# Patient Record
Sex: Male | Born: 1950
Health system: Southern US, Community
[De-identification: ages and names within clinical notes are randomized; demographics above are authoritative.]

## PROBLEM LIST (undated history)

## (undated) DIAGNOSIS — Z973 Presence of spectacles and contact lenses: Secondary | ICD-10-CM

## (undated) DIAGNOSIS — I10 Essential (primary) hypertension: Secondary | ICD-10-CM

## (undated) DIAGNOSIS — R55 Syncope and collapse: Secondary | ICD-10-CM

## (undated) DIAGNOSIS — Z808 Family history of malignant neoplasm of other organs or systems: Secondary | ICD-10-CM

## (undated) DIAGNOSIS — Z7901 Long term (current) use of anticoagulants: Secondary | ICD-10-CM

## (undated) DIAGNOSIS — J302 Other seasonal allergic rhinitis: Secondary | ICD-10-CM

## (undated) DIAGNOSIS — R351 Nocturia: Secondary | ICD-10-CM

## (undated) DIAGNOSIS — Z8349 Family history of other endocrine, nutritional and metabolic diseases: Secondary | ICD-10-CM

## (undated) DIAGNOSIS — Z8042 Family history of malignant neoplasm of prostate: Secondary | ICD-10-CM

## (undated) DIAGNOSIS — Z8619 Personal history of other infectious and parasitic diseases: Secondary | ICD-10-CM

## (undated) DIAGNOSIS — D170 Benign lipomatous neoplasm of skin and subcutaneous tissue of head, face and neck: Secondary | ICD-10-CM

## (undated) HISTORY — DX: Family history of malignant neoplasm of prostate: Z80.42

## (undated) HISTORY — DX: Other seasonal allergic rhinitis: J30.2

## (undated) HISTORY — DX: Personal history of other infectious and parasitic diseases: Z86.19

## (undated) HISTORY — DX: Family history of malignant neoplasm of other organs or systems: Z80.8

## (undated) HISTORY — DX: Syncope and collapse: R55

---

## 2004-09-04 ENCOUNTER — Observation Stay (HOSPITAL_COMMUNITY): Admission: EM | Admit: 2004-09-04 | Discharge: 2004-09-05 | Payer: Self-pay | Admitting: Emergency Medicine

## 2004-09-04 IMAGING — CR DG CHEST 2V
2 series · 2 of 2 positions shown · non-contrast
Comparison: none

CLINICAL DATA: Syncope.  Dyspnea. 
CHEST (TWO VIEWS)

[view not recorded (1 of 2)]
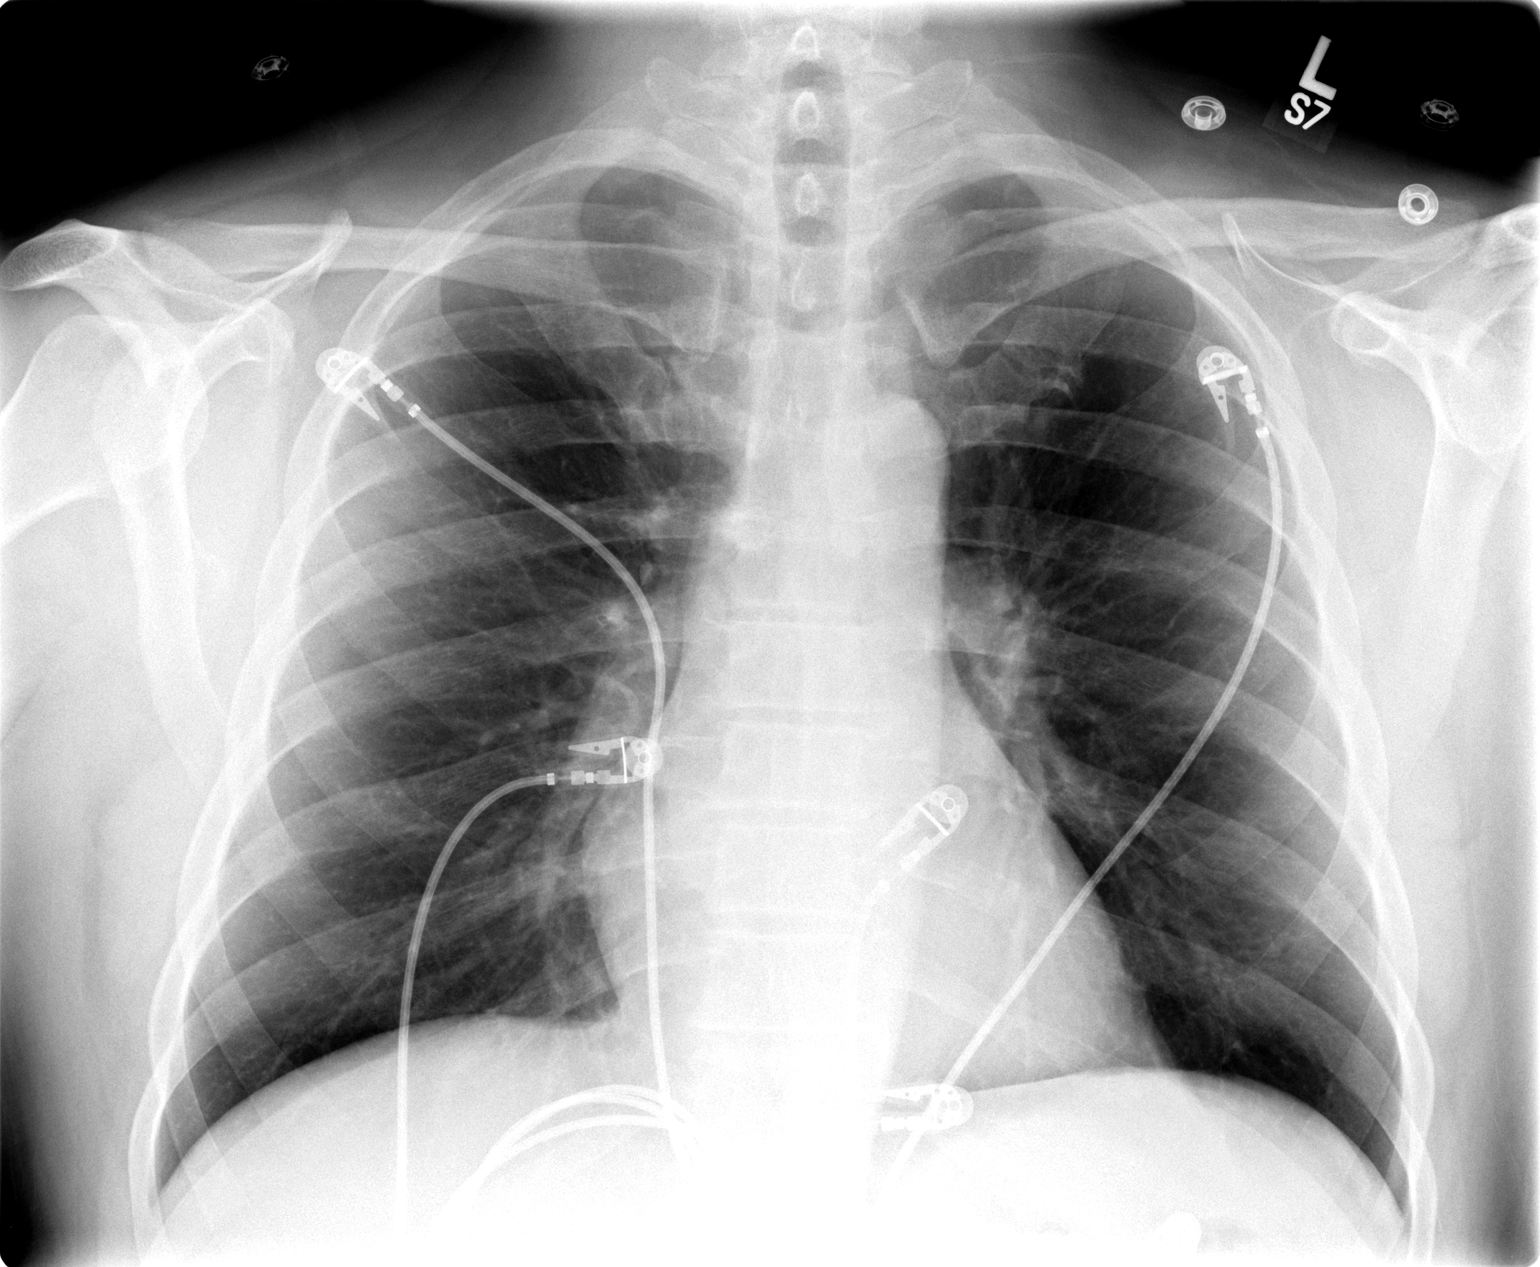

[view not recorded (2 of 2)]
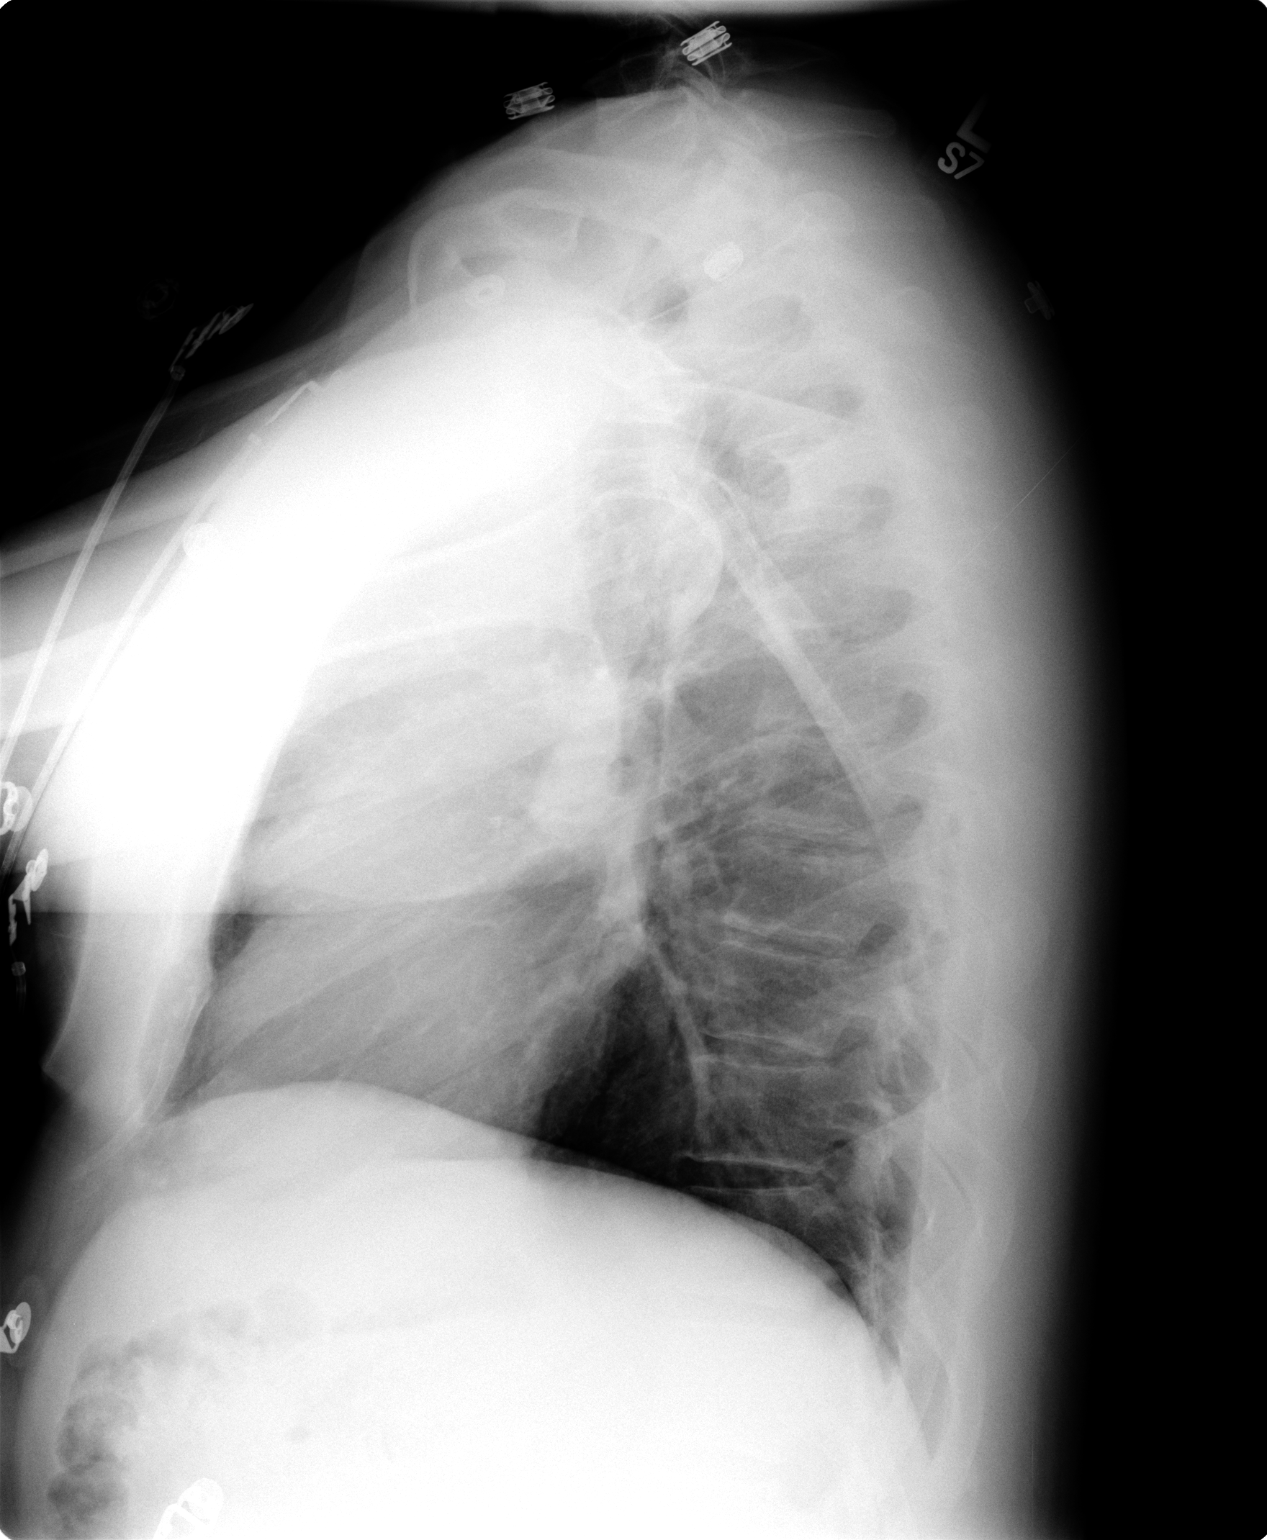

[2 of 2 positions shown; findings below may reference images not displayed]

FINDINGS: Normal cardiomediastinal silhouette.  Lungs are clear.  The bony thorax and upper abdomen is unremarkable.  
IMPRESSION
No evidence of active cardiopulmonary disease.  
CT HEAD WITHOUT CONTRAST
Multidetector helical CT scanning obtained from the skull base to the vertex.  
No evidence of an acute intracranial abnormality including mass or mass effect, hydrocephalus, extraaxial fluid collection, midline shift, hemorrhage or infarct.  Acute infarct may be missed by CT for 24 to 48 hours.  The visualized bony calvarium and paranasal sinuses are unremarkable. 
IMPRESSION
No evidence of acute intracranial abnormality.

## 2004-09-04 IMAGING — CT CT HEAD W/O CM
1 series · 16 of 30 positions shown, 20 images · non-contrast
Comparison: none

CLINICAL DATA: Syncope.  Dyspnea. 
CHEST (TWO VIEWS)

[Series 2: brain · axial · 0.49mm/px · z∈[+163,+301]mm · 16 of 32 slices shown, 20 images]
[im 2/32  brain]
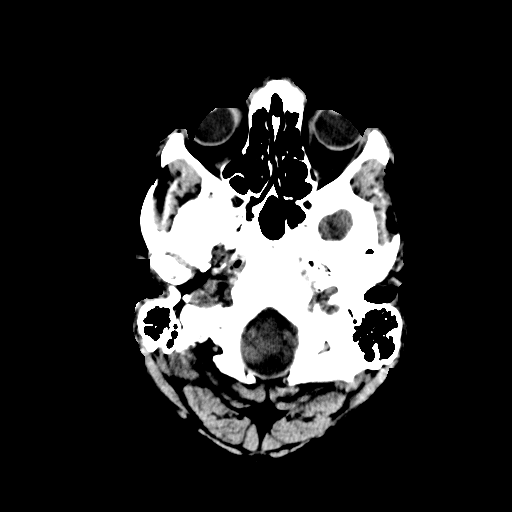
[im 2/32  bone]
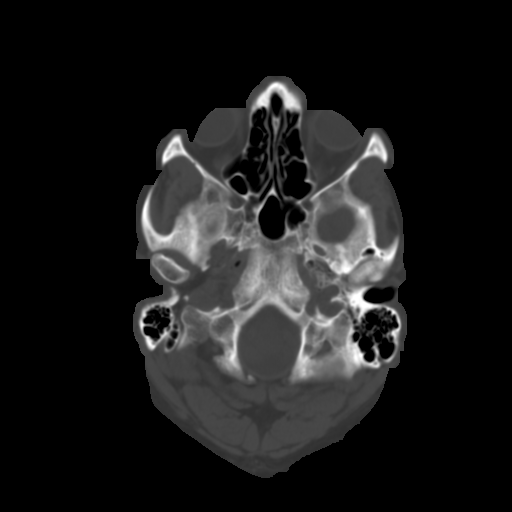
[im 4/32  brain]
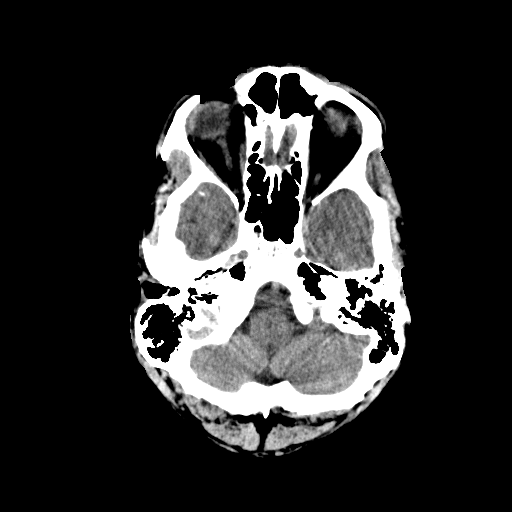
[im 6/32  brain]
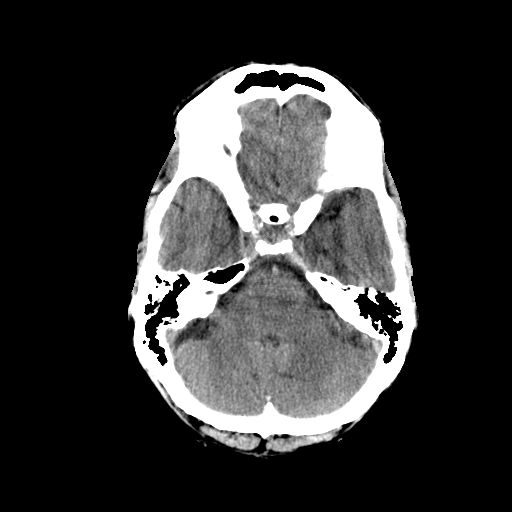
[im 8/32  brain]
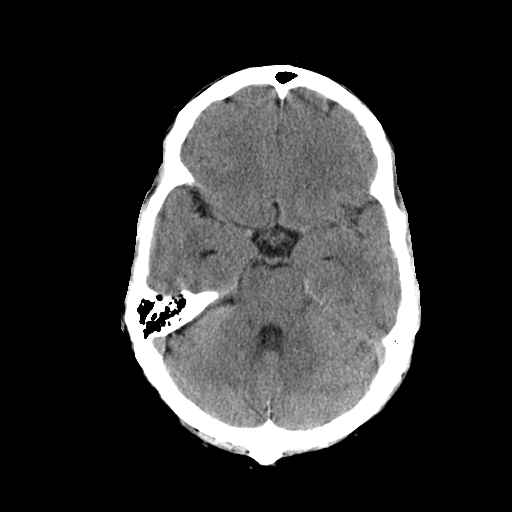
[im 9/32  brain]
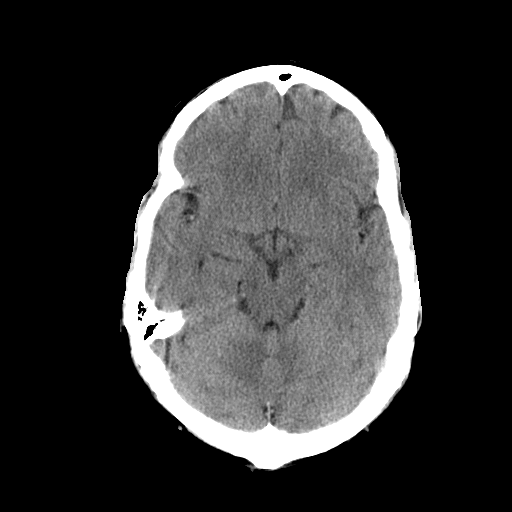
[im 9/32  bone]
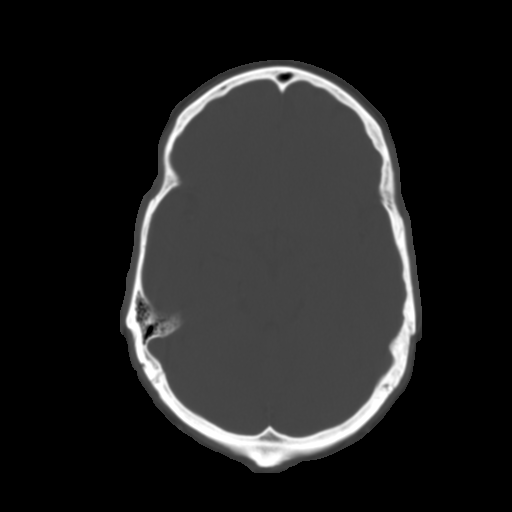
[im 11/32  brain]
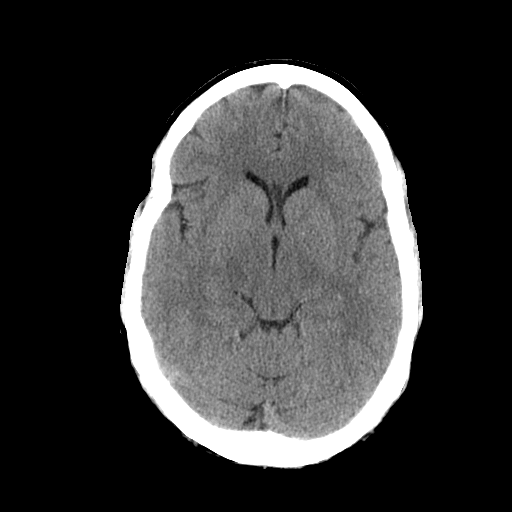
[im 13/32  brain]
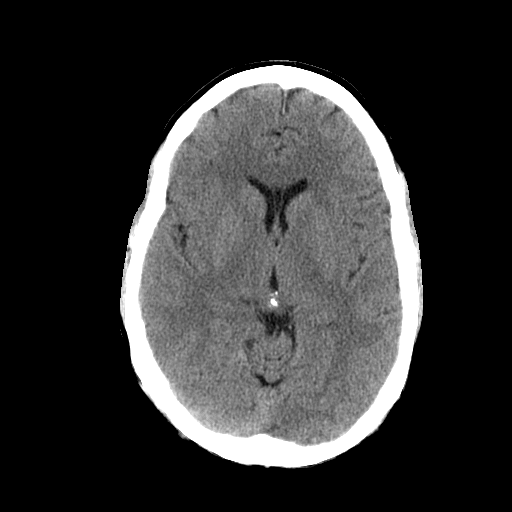
[im 15/32  brain]
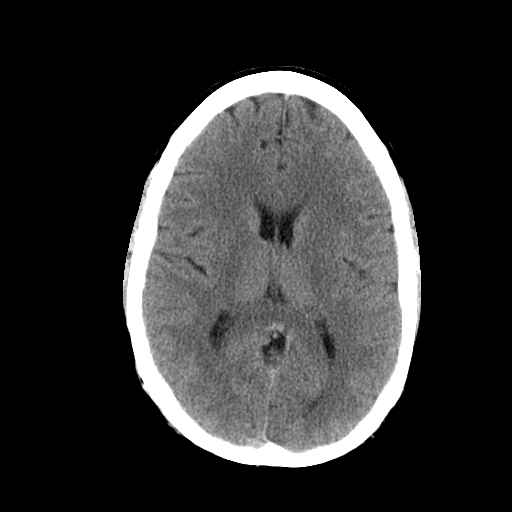
[im 17/32  brain]
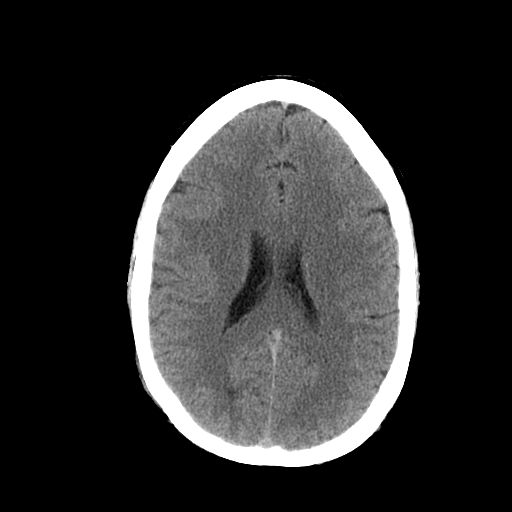
[im 17/32  bone]
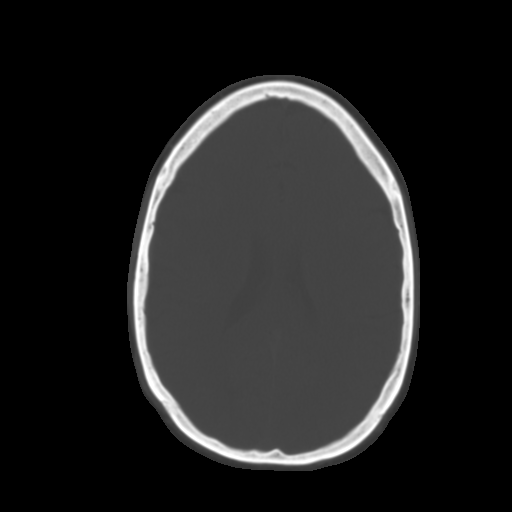
[im 19/32  brain]
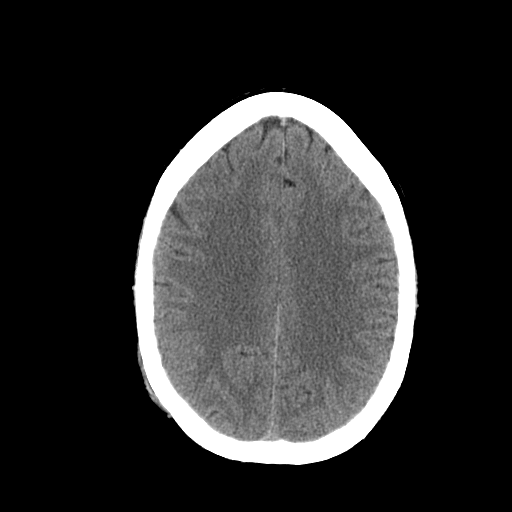
[im 21/32  brain]
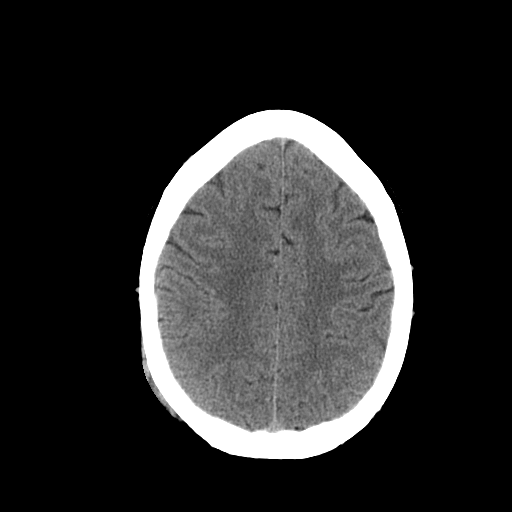
[im 23/32  brain]
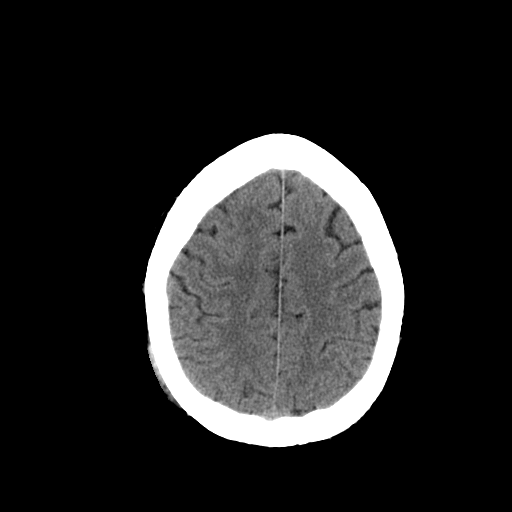
[im 24/32  brain]
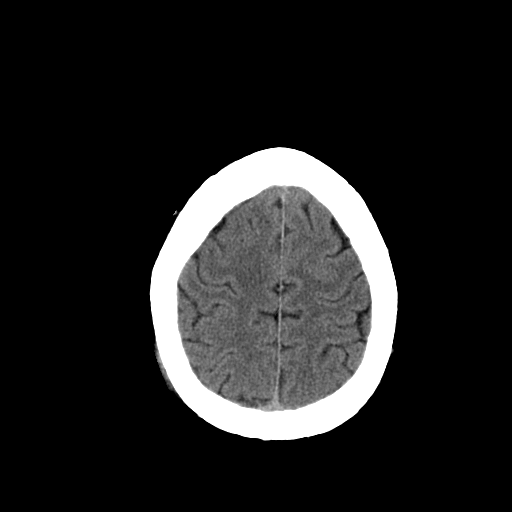
[im 24/32  bone]
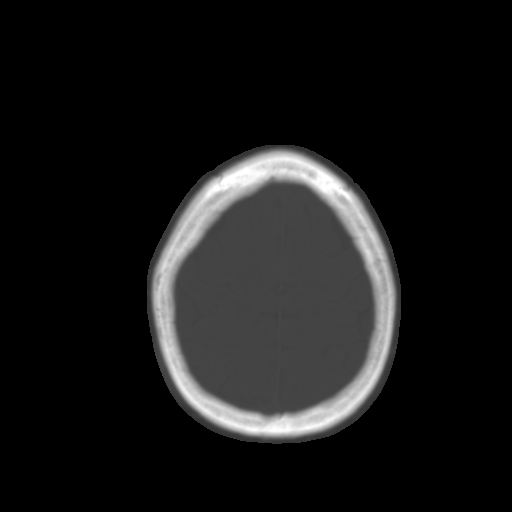
[im 26/32  brain]
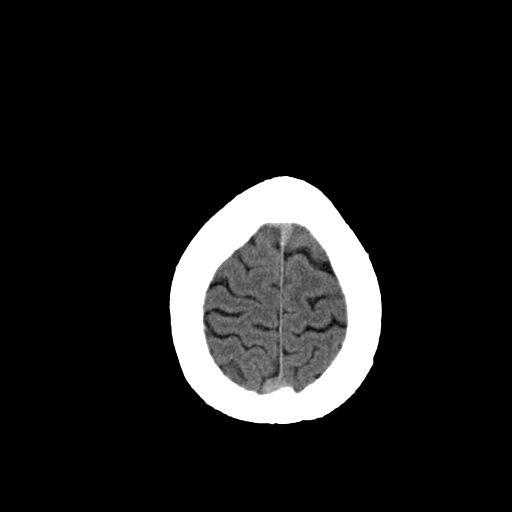
[im 28/32  brain]
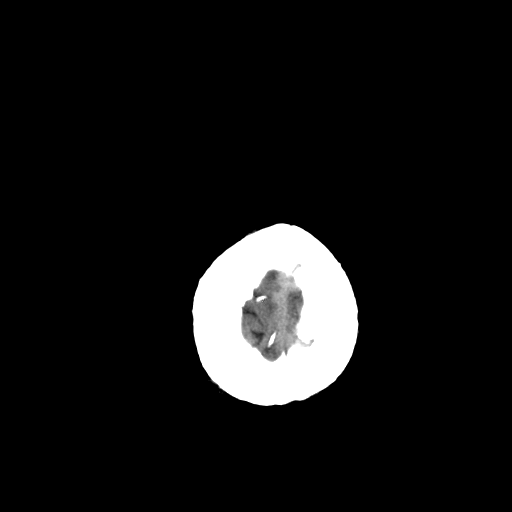
[im 30/32  brain]
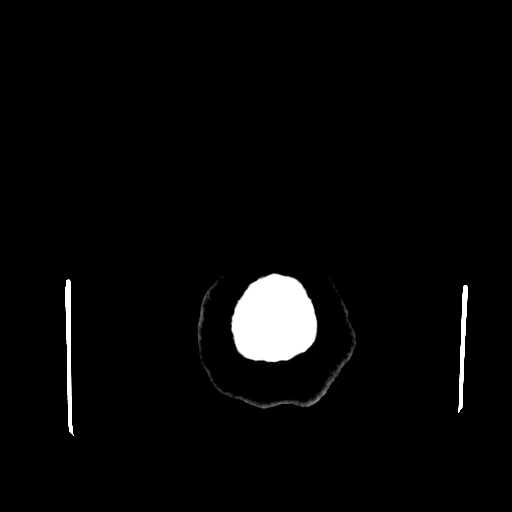

[16 of 30 positions shown; findings below may reference images not displayed]

FINDINGS: Normal cardiomediastinal silhouette.  Lungs are clear.  The bony thorax and upper abdomen is unremarkable.  
IMPRESSION
No evidence of active cardiopulmonary disease.  
CT HEAD WITHOUT CONTRAST
Multidetector helical CT scanning obtained from the skull base to the vertex.  
No evidence of an acute intracranial abnormality including mass or mass effect, hydrocephalus, extraaxial fluid collection, midline shift, hemorrhage or infarct.  Acute infarct may be missed by CT for 24 to 48 hours.  The visualized bony calvarium and paranasal sinuses are unremarkable. 
IMPRESSION
No evidence of acute intracranial abnormality.

## 2004-09-04 IMAGING — CT CT ANGIO CHEST
1 of 5 series · 10 of 30 positions shown · IV contrast ([ID] OMNI 300)
Comparison: none

CTA chest with contrast

Clinical: Syncope
TECHNIQUE: 120 cc [BA]. Images were reconstructed in multiple interviews

[Series 3: pe w/ lower ext · axial · 0.73mm/px · z∈[-348,-102]mm · 10 of 247 slices shown]
[im 25/247  lung]
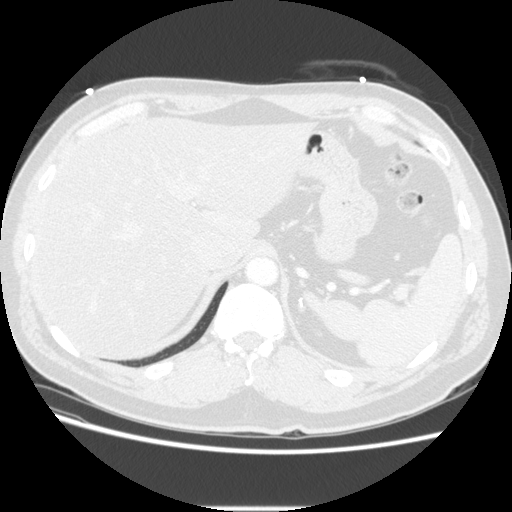
[im 50/247  mediastinal]
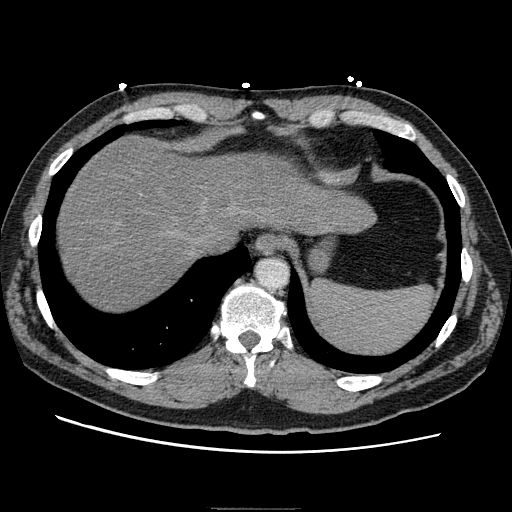
[im 74/247  lung]
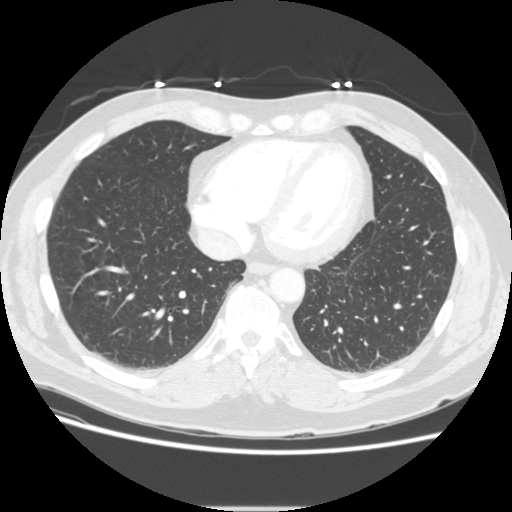
[im 99/247  mediastinal]
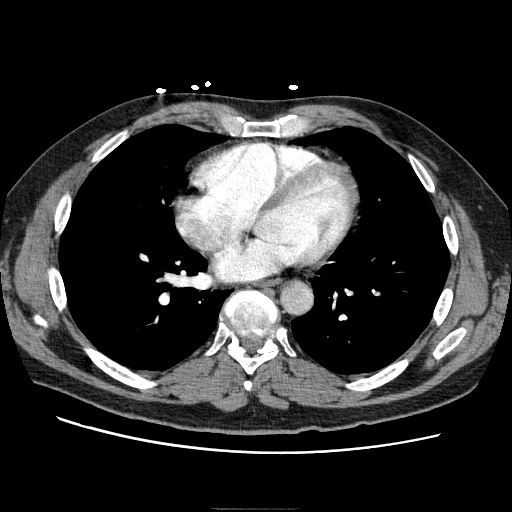
[im 116/247  lung]
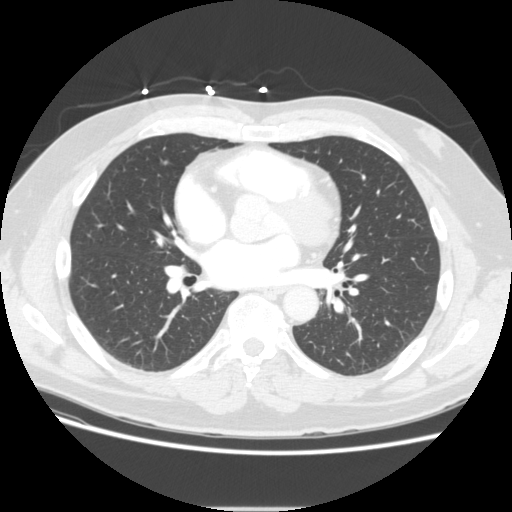
[im 124/247  mediastinal]
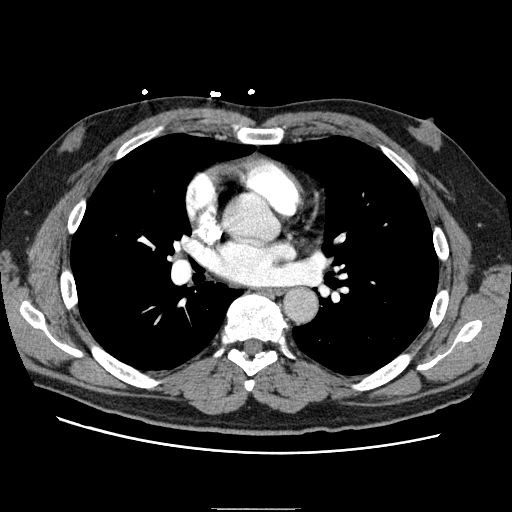
[im 148/247  lung]
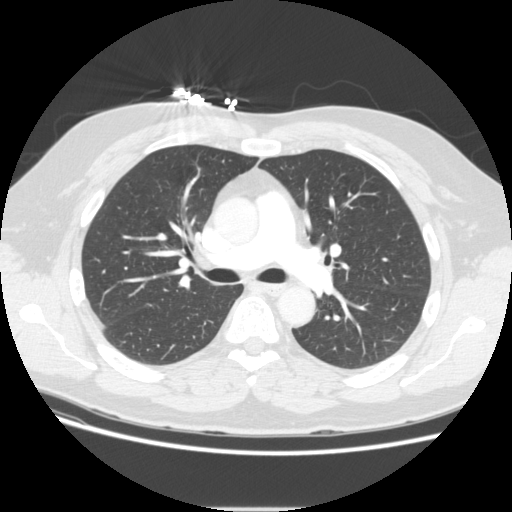
[im 173/247  mediastinal]
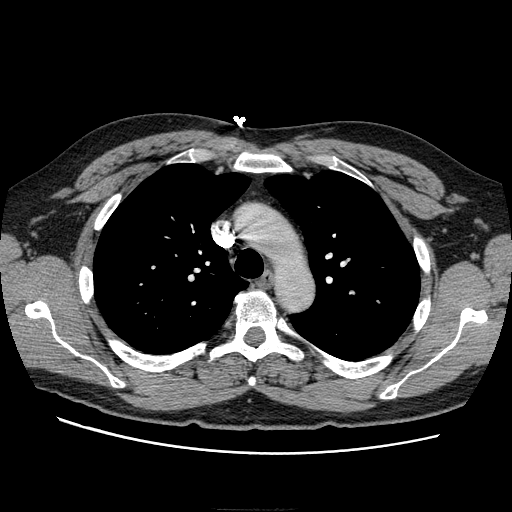
[im 197/247  lung]
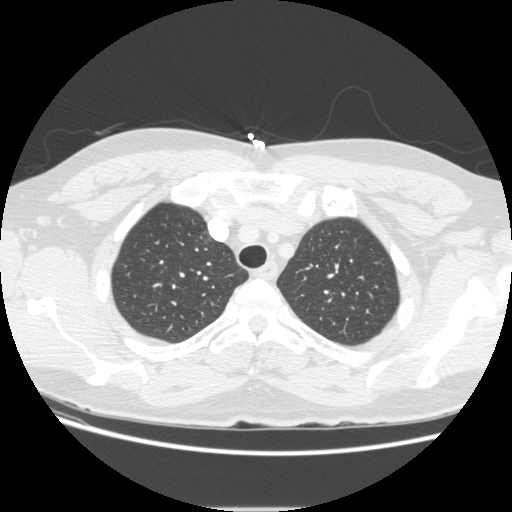
[im 222/247  mediastinal]
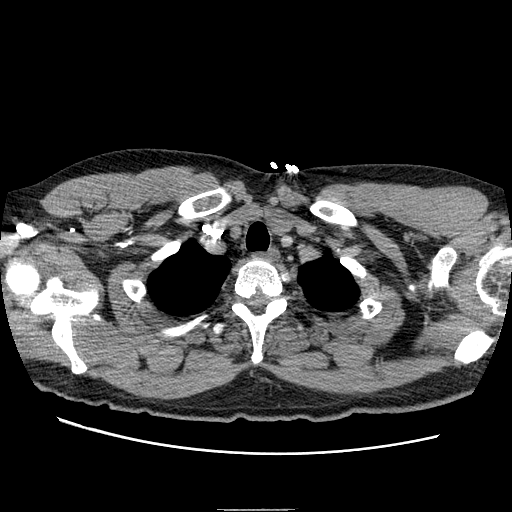

[10 of 30 positions shown; findings below may reference images not displayed]

FINDINGS: No filling defects are seen in the pulmonary arterial tree to suggest pulmonary
thromboemboli.

The small mediastinal nodes are present. None are pathologically enlarged by CT criteria. No
pneumothoraces or effusions are seen.

The lungs are clear

There is a fracture through the right first CERA manubrial articulation. Degenerative changes are
noted in the thoracic spine

Diffuse fatty infiltration of the liver is seen. The caudate lobe is heterogeneous with
hyperdensity peripherally and hypodensity centrally. There is a 3 cm simple cyst in the central
right lobe of the liver area the abdomen is otherwise benign.
IMPRESSION: 1. No evidence of pulmonary thromboembolism.
2. Fracture through the right first CERA manubrial junction.
3. Simple cyst in the right lobe of the liver.
4. Abnormal appearance of the caudate lobe of liver. This may represent focal fatty sparing.
Underlying liver lesion is not excluded. 3 phase liver study is recommended when feasible.

## 2004-12-06 DIAGNOSIS — Z87898 Personal history of other specified conditions: Secondary | ICD-10-CM

## 2004-12-06 HISTORY — DX: Personal history of other specified conditions: Z87.898

## 2005-02-02 ENCOUNTER — Ambulatory Visit: Payer: Self-pay | Admitting: Family Medicine

## 2005-02-10 ENCOUNTER — Ambulatory Visit: Payer: Self-pay | Admitting: Family Medicine

## 2005-03-04 ENCOUNTER — Ambulatory Visit: Payer: Self-pay | Admitting: Family Medicine

## 2005-05-07 ENCOUNTER — Ambulatory Visit: Payer: Self-pay | Admitting: Family Medicine

## 2005-05-12 ENCOUNTER — Ambulatory Visit: Payer: Self-pay | Admitting: Family Medicine

## 2005-09-05 ENCOUNTER — Emergency Department (HOSPITAL_COMMUNITY): Admission: EM | Admit: 2005-09-05 | Discharge: 2005-09-05 | Payer: Self-pay | Admitting: Emergency Medicine

## 2005-09-05 IMAGING — CT CT HEAD W/O CM
1 of 2 series · 13 of 30 positions shown, 17 images · IV contrast (agent unspecified)
Comparison: none

HISTORY: Syncope, weakness

CT HEAD WITHOUT CONTRAST:
Routine noncontrast CT head compared to [DATE]
Normal ventricular morphology.
No midline shift or mass-effect.
Normal appearance of brain parenchyma without mass, hemorrhage, or infarction.
Visualized sinuses clear.
Bones unremarkable.

[Series 2: brain · axial · 0.47mm/px · z∈[+129,+256]mm · 13 of 32 slices shown, 17 images]
[im 3/32  brain]
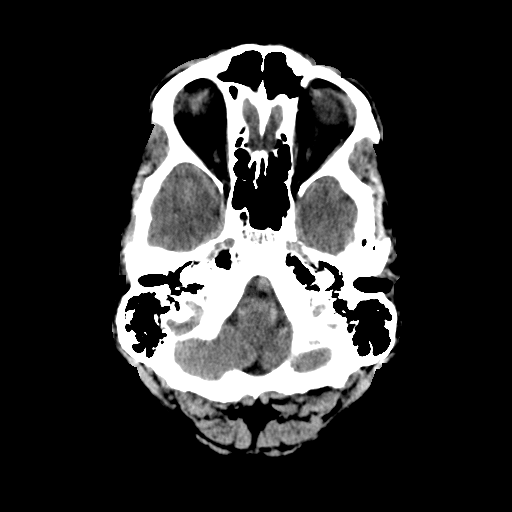
[im 3/32  bone]
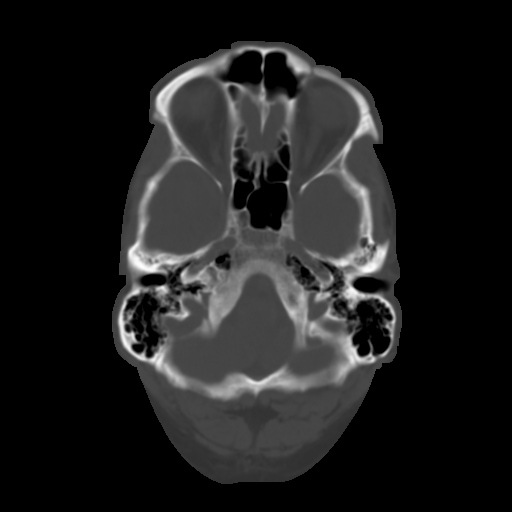
[im 5/32  brain]
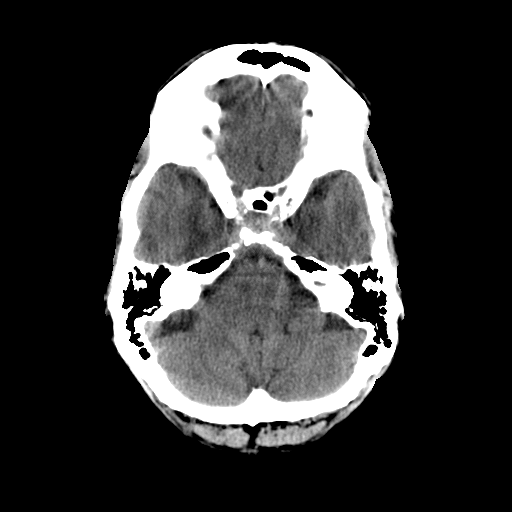
[im 7/32  brain]
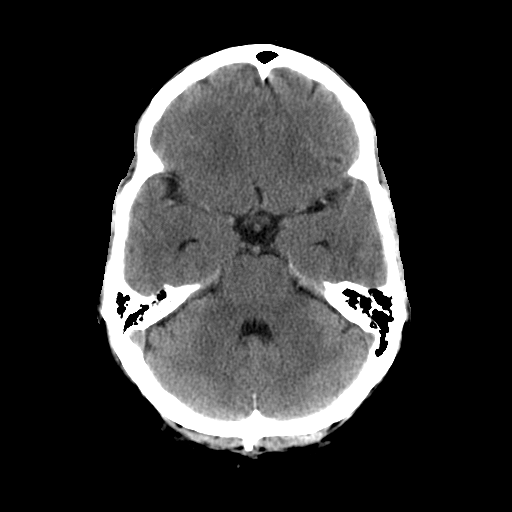
[im 9/32  brain]
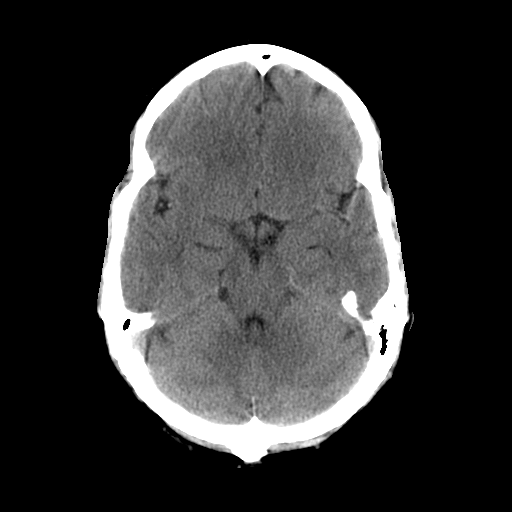
[im 12/32  brain]
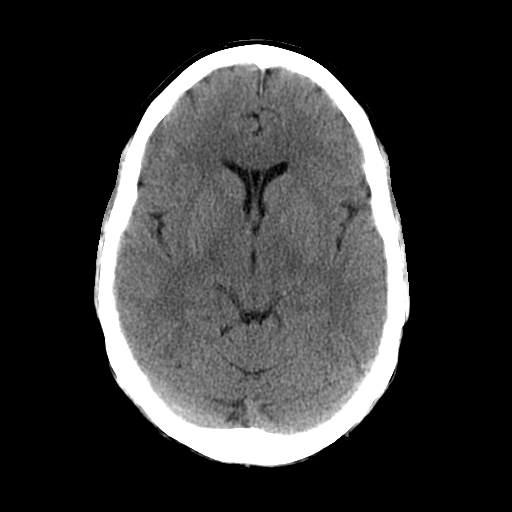
[im 12/32  bone]
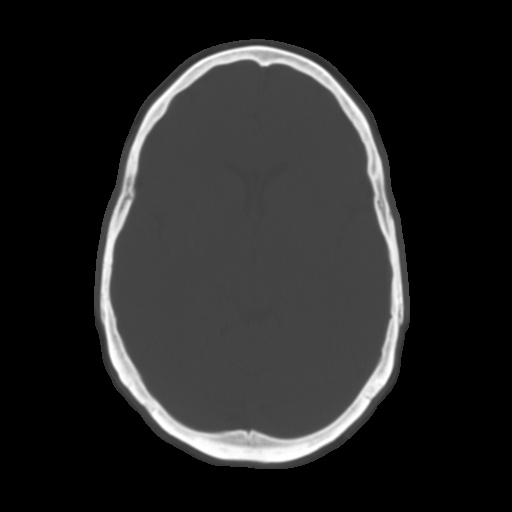
[im 14/32  brain]
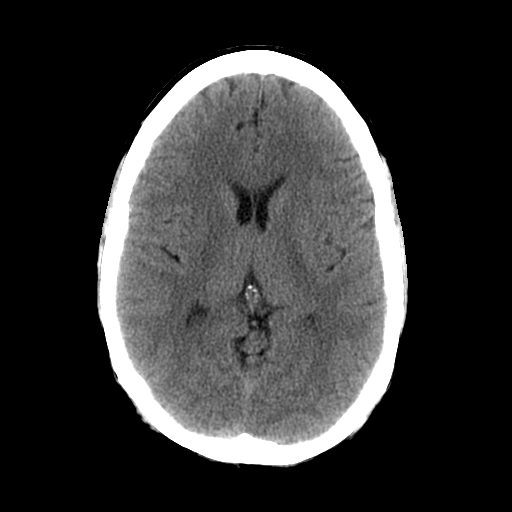
[im 16/32  brain]
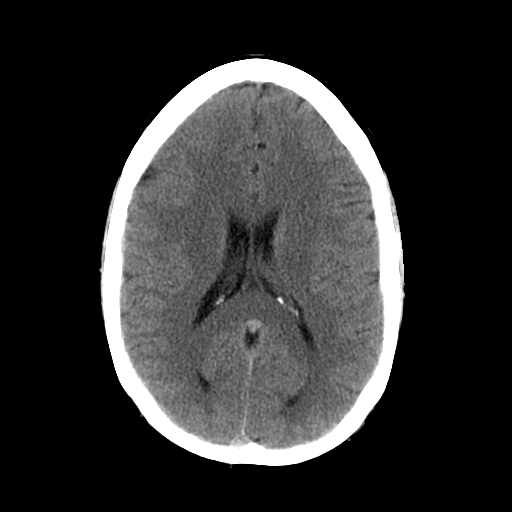
[im 18/32  brain]
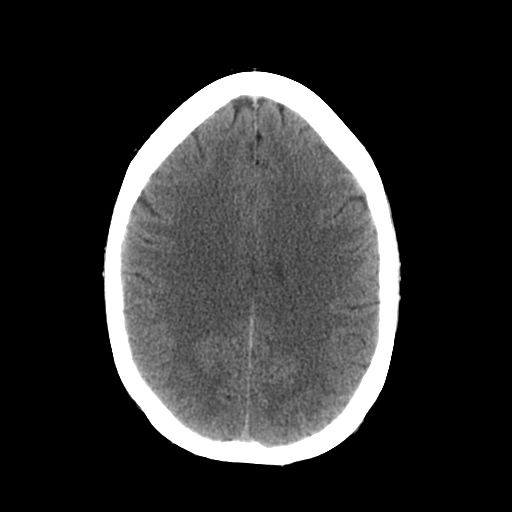
[im 20/32  brain]
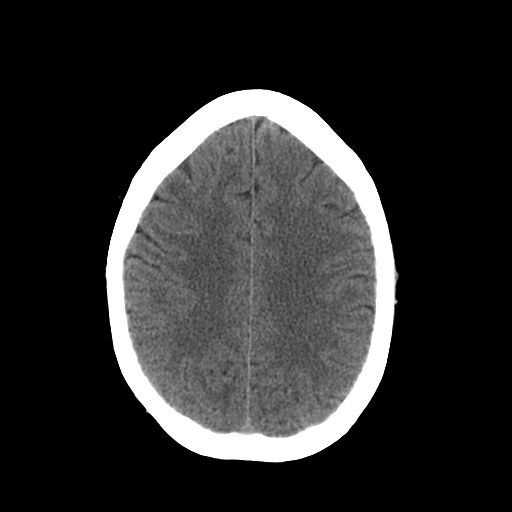
[im 20/32  bone]
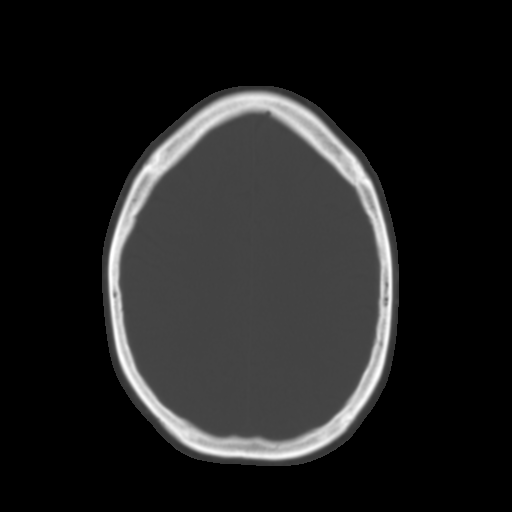
[im 23/32  brain]
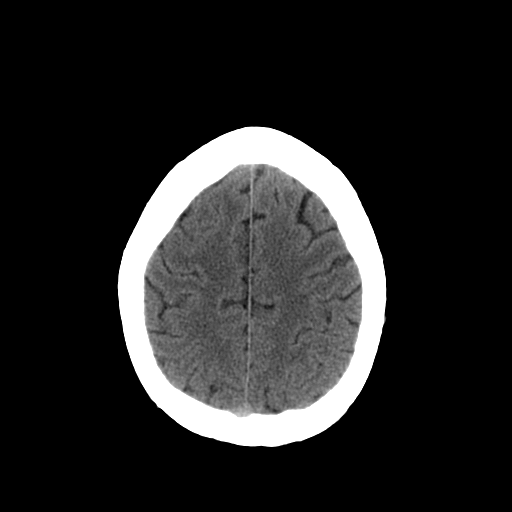
[im 25/32  brain]
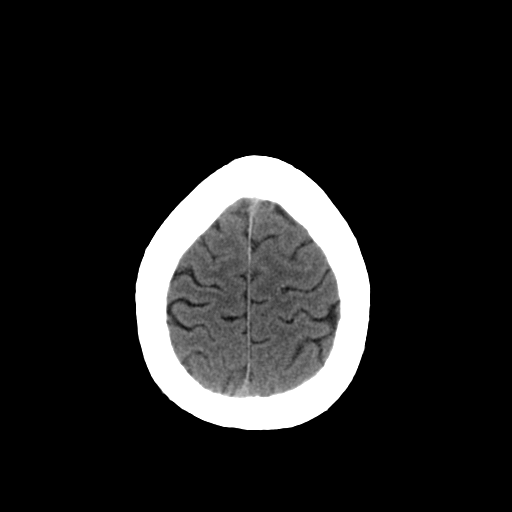
[im 27/32  brain]
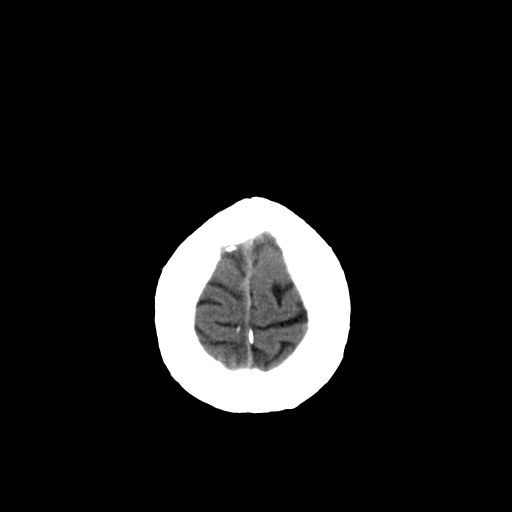
[im 29/32  brain]
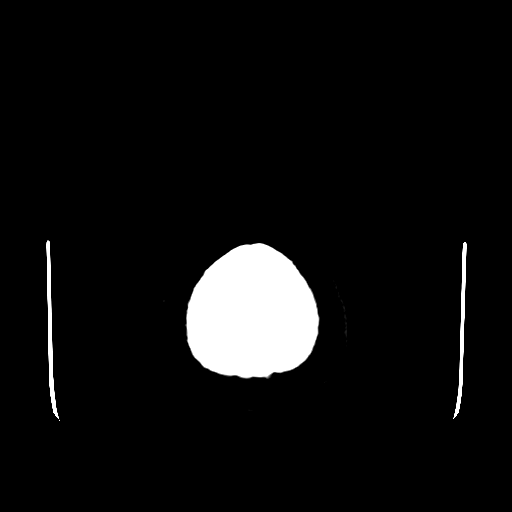
[im 29/32  bone]
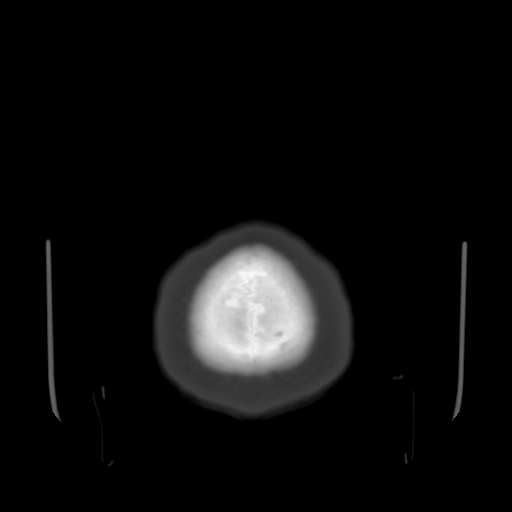

[13 of 30 positions shown; findings below may reference images not displayed]

IMPRESSION: No acute intracranial abnormality.

## 2005-09-07 ENCOUNTER — Ambulatory Visit: Payer: Self-pay | Admitting: Family Medicine

## 2005-09-13 ENCOUNTER — Ambulatory Visit: Payer: Self-pay

## 2005-09-28 ENCOUNTER — Ambulatory Visit: Payer: Self-pay | Admitting: Cardiology

## 2005-09-29 ENCOUNTER — Ambulatory Visit: Payer: Self-pay

## 2005-09-30 ENCOUNTER — Ambulatory Visit: Payer: Self-pay | Admitting: Internal Medicine

## 2005-09-30 ENCOUNTER — Ambulatory Visit (HOSPITAL_COMMUNITY): Admission: RE | Admit: 2005-09-30 | Discharge: 2005-09-30 | Payer: Self-pay | Admitting: Cardiology

## 2005-09-30 ENCOUNTER — Encounter: Payer: Self-pay | Admitting: Internal Medicine

## 2005-10-04 ENCOUNTER — Ambulatory Visit: Payer: Self-pay | Admitting: Internal Medicine

## 2005-10-04 ENCOUNTER — Ambulatory Visit (HOSPITAL_COMMUNITY): Admission: RE | Admit: 2005-10-04 | Discharge: 2005-10-04 | Payer: Self-pay | Admitting: Internal Medicine

## 2005-10-04 HISTORY — PX: LOOP RECORDER INSERTION: EP1214

## 2005-10-05 ENCOUNTER — Ambulatory Visit: Payer: Self-pay

## 2005-10-14 ENCOUNTER — Ambulatory Visit: Payer: Self-pay

## 2005-11-05 ENCOUNTER — Ambulatory Visit: Payer: Self-pay | Admitting: Internal Medicine

## 2005-12-06 DIAGNOSIS — R55 Syncope and collapse: Secondary | ICD-10-CM

## 2005-12-06 HISTORY — PX: OTHER SURGICAL HISTORY: SHX169

## 2005-12-06 HISTORY — DX: Syncope and collapse: R55

## 2005-12-08 ENCOUNTER — Ambulatory Visit: Payer: Self-pay | Admitting: Internal Medicine

## 2005-12-28 ENCOUNTER — Ambulatory Visit: Payer: Self-pay | Admitting: Family Medicine

## 2006-09-01 ENCOUNTER — Ambulatory Visit: Payer: Self-pay

## 2007-05-03 ENCOUNTER — Ambulatory Visit: Payer: Self-pay | Admitting: Internal Medicine

## 2011-04-23 NOTE — Discharge Summary (Signed)
Shane [name], Shane [name]              ACCOUNT NO.:  [id number]   MEDICAL RECORD NO.:  [id number]          PATIENT TYPE:  INP   LOCATION:  3729                         FACILITY:  [facility]   PHYSICIAN:  Gordy Savers, M.D. LHCDATE OF BIRTH:  07/31/1951   DATE OF ADMISSION:  09/04/2004  DATE OF DISCHARGE:  09/05/2004                                 DISCHARGE SUMMARY   FINAL DIAGNOSES:  1.  Dyspnea.  2.  Syncope.  3.  Elevated liver function studies (possible heterozygous for alpha-I      antitrypsin deficiency).   PROCEDURES:  Spiral CT, telemetry.   HISTORY OF PRESENT ILLNESS:  The patient is a 60 year old white gentleman in  excellent prior history who was admitted to the hospital after awakening  with shortness of breath.  He apparently became quite clammy and had a  syncopal episode at that time.   PAST MEDICAL HISTORY:  Fairly unremarkable.   FAMILY HISTORY:  Pertinent for a paternal family history as well as other  non drinking relatives that had a history of cirrhosis and father with a  liver transplant.  Family members were told to have alpha1-antitrypsin level  checked.  He is a nondrinker.   LABORATORY DATA AND HOSPITAL COURSE:  The patient was admitted to the  telemetry setting where he is monitored during the entire hospital stay, he  remained in normal sinus rhythm with episodes of mild sinus bradycardia  only.  Laboratory studies were unremarkable except for some elevated liver  function studies.  Transaminases were minimally elevated.  Cardiac enzymes  and EKG normal.  Spiral CT scan was negative for acute pulmonary embolism,  but did suggest some fatty liver changes in the caudate lobe of the liver.  At the time of dictation laboratory evaluation for cirrhosis including  chronic hepatitis panel, antimitochondrial antibody, alpha1-antitrypsin  level were pending.  The patient's gamma glutamyl transpeptidase and  ceruloplasmin level were normal.  His ferritin level  was elevated in the 500  range.   DISPOSITION:  The patient will be discharged today with followup with his  primary care physician within the next week.  He has been asked to limit  strenuous activities until reassessed by his primary care physician.  He  will be discharged on no medications.  He will report any further symptoms.   CONDITION ON DISCHARGE:  Stable.      PFK/MEDQ  D:  09/05/2004  T:  09/06/2004  Job:  [id number]

## 2011-04-23 NOTE — H&P (Signed)
NAMEIFEANYI, MICKELSON              ACCOUNT NO.:  [id number]   MEDICAL RECORD NO.:  [id number]          PATIENT TYPE:  EMS   LOCATION:  MAJO                         FACILITY:  [facility]   PHYSICIAN:  Titus Dubin. Alwyn Ren, M.D. [facility] OF BIRTH:  Jul 04, 1951   DATE OF ADMISSION:  09/04/2004  DATE OF DISCHARGE:                                HISTORY & PHYSICAL   HISTORY OF PRESENT ILLNESS:  Shane [name] is a 60 year old, white male  admitted with syncope.  He went to bed on September 29, with no complaints  or symptoms.  He awoke at approximately 1:30 a.m. of September 04, 2004,  with marked shortness of breath and confusion.  Apparently, he got out of  bed and suddenly a few feet from the bed passed out.  He sustained blunt  trauma to his head.  His wife had fallen asleep downstairs and heard the  initial noise and thought that it was simply a door shutting.  A few minutes  later, she heard another thud prompting her to rush upstairs where she  found her husband on the floor.  There was no seizure stigmata noted, but he  was clammy.  He denies any chest pain with the symptoms.   PAST MEDICAL HISTORY:  1.  No surgery.  2.  Hospitalized with pneumonia at age 41.   ALLERGIES:  PENICILLIN.   MEDICATIONS:  None.   SOCIAL HISTORY:  He rarely drinks alcohol and does not smoke.  He is  physically active in a farm environment.   FAMILY HISTORY:  Long family history of liver failure in the paternal  family, specifically his father, paternal uncle and paternal grandmother  with liver disease.  All of them were nondrinkers.  Apparently, this was  evaluated at [facility] and it was felt to be a nongenetic issue.  Paternal aunt had breast cancer.  Father had diabetes.  Paternal grandmother  had deep venous thrombosis.  Paternal grandfather had stroke.   REVIEW OF SYSTEMS:  Totally negative except for occasional dysphagia.  He  feels that this was related to pockets in the posterior pharynx.   PHYSICAL EXAMINATION:  GENERAL:  At this time, he appears to be in no acute  distress.  VITAL SIGNS:  Temperature 96.7, blood pressure initially 153/81.  At this  time, his blood pressure is 99/61.  Postural blood pressure revealed that  lying was 129/79, sitting 136/78 and standing 141/62.  The pulse varied from  59-63 with these maneuvers.  HEENT:  He has a thick bearded mustache.  Otolaryngologic exam is  unremarkable.  Dental hygiene is good.  Fundal exam was unremarkable.  NECK:  There is a large lipoma over the right posterior neck.  He has no  carotid bruits.  Thyroid normal to palpation.  CHEST:  Clear.  He has an S4.  All pulses are intact with no bruits.  ABDOMEN:  No tenderness or masses.  Pedal pulses are intact.  SKIN:  Warm and dry.  NEUROLOGIC:  Toes are upgoing with Babinski maneuver.  There are no  localizing neurologic signs.  He is tender over  the right posterior skull at  the site of the trauma.   LABORATORY DATA AND X-RAY FINDINGS:  SGOT 40, SGPT 59.  His white count is  10,700.  Cardiac enzymes were negative as was a CT of the brain.  Although D-  dimer was normal, a spiral CT will be performed to rule out pulmonary  thromboemboli because of the initial presentation of shortness of breath.   ASSESSMENT/PLAN:  He will be monitored on telemetry and postural blood  pressures checked.  I have encouraged him to have a hepatologic evaluation  in view of the family history and in view of the mild elevation of the liver  enzymes.  If there is no dysrhythmias over 24 hours and there is no evidence  of pulmonary thromboemboli, further evaluation can be pursued as an  outpatient.       WFH/MEDQ  D:  09/04/2004  T:  09/04/2004  Job:  [id number]   cc:   Laurita Quint, M.D.  [address] [place]  [place] [address]  Fax: [phone]

## 2011-04-23 NOTE — Op Note (Signed)
Shane [name], Shane [name]              ACCOUNT NO.:  [id number]   MEDICAL RECORD NO.:  [id number]          PATIENT TYPE:  OIB   LOCATION:  2899                         FACILITY:  [facility]   PHYSICIAN:  Duke Salvia, M.D.  DATE OF BIRTH:  1951/09/07   DATE OF PROCEDURE:  10/04/2005  DATE OF DISCHARGE:                                 OPERATIVE REPORT   _________   PREOPERATIVE DIAGNOSIS:  Recurrent syncope.   POSTOPERATIVE DIAGNOSIS:  Recurrent syncope.   PROCEDURE:  Implantable loop recorder insertion.   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  They  were spaced about a year apart.  The first episode occurred one morning  while he was sleeping.  He awaken and he was unable to catch his breath.  He  got up out of bed and fell to the floor.  His wife was not in the room.  She  heard him hit the floor.  She called up the stairs, Are you all right?  He  was immediately able to answer that, the answer to that was no.  He was  aware, however, that he was unable to get up and take himself to the  bathroom and back to the bed.  There was no recollection that he was pale  nor that he had pallor, there were no accompanying GI symptoms, and there  was, as noted, significant residual fatigue.  The patient does not have  records of blood pressure or heart rate on arrival of EMS.  The next episode  occurred about a year later.  He was sitting in his chair, watching  television.  He called out to his wife that something was wrong.  There was  some preceding abdominal pain.  His wife came in and noticed that he was  unresponsive for about 30 seconds.  He awakened.  He said that he needed to  go to back to the bathroom.  He again became unresponsive.  Again, he lost  no postural tone on either of these occasions.  Throughout 30 seconds,  during which his eyes had closed, again without any significant  pallor  noted.  The patient awakened, he got himself up from the chair, walked  towards the bathroom, and fell to the floor.  He was out again for about 30  seconds.  At this point, he awakened.  His son had arrived by this time.  He  was able to help him get up and go to the bathroom without significant  residual orthostatic intolerance.   The patient has had a cardiac evaluation including a stress test that was  normal, an ultrasound that was normal, and an ECG that was also normal.  There were no accompanying tachy palpitations.   DESCRIPTION OF PROCEDURE:  Following the obtaining of informed consent, the  patient was brought to the electrophysiology laboratory and placed on the  fluoroscopic table in the supine position.  After routine prep and drape,  lidocaine was infiltrated about 2 cm caudal to  the clavicle and about 1.5 cm  lateral to the sternum.  An incision was made and carried down to the layer  of the prepectoral fascia using electrocautery and sharp dissection.  A  pocket was formed similarly.  Hemostasis was obtained.   Two 2-0 silk sutures were placed at that cephalad aspect of the pocket and  was used to secure a [employer] Reveal +[id number] loop recorder serial  #[id number].  The wound was then copiously irrigated with saline.  The wound  was  closed in three layers in the normal fashion.  The wound was washed and  dried.  __________ Steri-Strip dressing was applied.  Needle count, sponge  count, instrument counts were correct at the end of procedure, according to  the staff.  The patient tolerated the procedure without apparent  complications.           ______________________________  Duke Salvia, M.D.     SCK/MEDQ  D:  10/04/2005  T:  10/04/2005  Job:  [id number]   cc:   Laurita Quint, M.D.  Fax: [phone]   Elect[facility]iology [facility]

## 2015-01-17 ENCOUNTER — Encounter: Payer: Self-pay | Admitting: Family Medicine

## 2015-01-17 ENCOUNTER — Ambulatory Visit (INDEPENDENT_AMBULATORY_CARE_PROVIDER_SITE_OTHER): Payer: BLUE CROSS/BLUE SHIELD | Admitting: Family Medicine

## 2015-01-17 VITALS — BP 134/78 | HR 64 | Temp 98.0°F | Ht 71.5 in | Wt 189.0 lb

## 2015-01-17 DIAGNOSIS — R55 Syncope and collapse: Secondary | ICD-10-CM

## 2015-01-17 NOTE — Assessment & Plan Note (Signed)
Will request prior records. Surprising that he's had implantable monitor for practically last 10 years. Reassuringly no recurrent episodes since then. Endorses baseline asymptomatic bradycardia. Requests we await until August for referral back to cardiology to re establish. I have requested paper records from cardiology as no records in EMR.  I have also requested paper chart from 2007 when he was last seen here. Pt declines EKG today - financial concerns.

## 2015-01-17 NOTE — Progress Notes (Signed)
Pre visit review using our clinic review tool, if applicable. No additional management support is needed unless otherwise documented below in the visit note. 

## 2015-01-17 NOTE — Progress Notes (Signed)
BP 134/78 mmHg  Pulse 64  Temp(Src) 98 F (36.7 C) (Oral)  Ht 5' 11.5" (1.816 m)  Wt 189 lb (85.73 kg)  BMI 26.00 kg/m2   CC: re establish care  Subjective:    Patient ID: Shane [name], male    DOB: Aug 27, 1951, 64 y.o.   MRN: [id number]  HPI: Shane [name] is a 64 y.o. male presenting on 01/17/2015 for Establish Care   Prior saw Dr Council Mechanic.  Last seen here 2007. I don't have any records available.   Syncopal episode x2 2007 s/p unrevealing hospitalization. Referred to neurologist, normal eval. Referred to cardiologist, heart monitor placed by Dr Caryl Comes and he was never contacted for follow up or removal. Pt upset over this but never contacted our office or cardiology for f/u. No recurrent syncopal episodes since 2008. Denies chest pain, tightness, dyspnea, headaches, palpitations. Endorses heart rate tends to drop to 30s when asleep, when awake tends to run 50-60.  Asks to wait until 07/2015 for referral back to cards.  Preventative: Colonoscopy remotely normal Flu shot - declines Tdap - deferred   Lives with wife Grown children Occ: retired Engineer, structural, Musician Edu: Bachelor of Art Activity: stays active outdoors working Diet: some water, fruits/vegetables daily  Relevant past medical, surgical, family and social history reviewed and updated as indicated. Interim medical history since our last visit reviewed. Allergies and medications reviewed and updated. No current outpatient prescriptions on file prior to visit.   No current facility-administered medications on file prior to visit.    Review of Systems Per HPI unless specifically indicated above     Objective:    BP 134/78 mmHg  Pulse 64  Temp(Src) 98 F (36.7 C) (Oral)  Ht 5' 11.5" (1.816 m)  Wt 189 lb (85.73 kg)  BMI 26.00 kg/m2  Wt Readings from Last 3 Encounters:  01/17/15 189 lb (85.73 kg)    Physical Exam  Constitutional: He appears well-developed and well-nourished. No  distress.  HENT:  Head: Normocephalic and atraumatic.  Mouth/Throat: Oropharynx is clear and moist. No oropharyngeal exudate.  Eyes: Conjunctivae and EOM are normal. Pupils are equal, round, and reactive to light. No scleral icterus.  Neck: Normal range of motion. Neck supple. No thyromegaly present.  Cardiovascular: Normal rate, regular rhythm, normal heart sounds and intact distal pulses.   No murmur heard. Pulmonary/Chest: Effort normal and breath sounds normal. No respiratory distress. He has no wheezes. He has no rales.  L monitor palpable under skin superior L chest  Musculoskeletal: He exhibits no edema.  Lymphadenopathy:    He has no cervical adenopathy.  Skin: Skin is warm and dry. No rash noted.  Psychiatric: He has a normal mood and affect.  Nursing note and vitals reviewed.  No results found for this or any previous visit.    Assessment & Plan:   Problem List Items Addressed This Visit    Syncopal episodes - Primary    Will request prior records. Surprising that he's had implantable monitor for practically last 10 years. Reassuringly no recurrent episodes since then. Endorses baseline asymptomatic bradycardia. Requests we await until August for referral back to cardiology to re establish. I have requested paper records from cardiology as no records in EMR.  I have also requested paper chart from 2007 when he was last seen here. Pt declines EKG today - financial concerns.          Follow up plan: Return in about 6 months (around 07/18/2015), or as needed,  for welcome to medicare visit.

## 2015-01-17 NOTE — Patient Instructions (Addendum)
Return in August for welcome to medicare visit. At that time we will refer you back to cardiology. Watch for any recurrent passing out episodes. Good to meet you today, call us with questions.

## 2015-07-23 ENCOUNTER — Encounter: Payer: PPO | Admitting: Family Medicine

## 2015-07-29 ENCOUNTER — Encounter: Payer: Self-pay | Admitting: Family Medicine

## 2015-07-29 ENCOUNTER — Ambulatory Visit (INDEPENDENT_AMBULATORY_CARE_PROVIDER_SITE_OTHER): Payer: PPO | Admitting: Family Medicine

## 2015-07-29 VITALS — BP 134/62 | HR 54 | Temp 98.2°F | Ht 71.5 in | Wt 182.0 lb

## 2015-07-29 DIAGNOSIS — Z9289 Personal history of other medical treatment: Secondary | ICD-10-CM

## 2015-07-29 DIAGNOSIS — D170 Benign lipomatous neoplasm of skin and subcutaneous tissue of head, face and neck: Secondary | ICD-10-CM

## 2015-07-29 DIAGNOSIS — Z7189 Other specified counseling: Secondary | ICD-10-CM

## 2015-07-29 DIAGNOSIS — Z0000 Encounter for general adult medical examination without abnormal findings: Secondary | ICD-10-CM

## 2015-07-29 DIAGNOSIS — Z13220 Encounter for screening for lipoid disorders: Secondary | ICD-10-CM

## 2015-07-29 DIAGNOSIS — Z1211 Encounter for screening for malignant neoplasm of colon: Secondary | ICD-10-CM

## 2015-07-29 DIAGNOSIS — R001 Bradycardia, unspecified: Secondary | ICD-10-CM

## 2015-07-29 DIAGNOSIS — Z8349 Family history of other endocrine, nutritional and metabolic diseases: Secondary | ICD-10-CM

## 2015-07-29 NOTE — Assessment & Plan Note (Addendum)
I have personally reviewed the Medicare Annual Wellness questionnaire and have noted 1. The patient's medical and social history 2. Their use of alcohol, tobacco or illicit drugs 3. Their current medications and supplements 4. The patient's functional ability including ADL's, fall risks, home safety risks and hearing or visual impairment. Cognitive function has been assessed and addressed as indicated.  5. Diet and physical activity 6. Evidence for depression or mood disorders The patients weight, height, BMI have been recorded in the chart. I have made referrals, counseling and provided education to the patient based on review of the above and I have provided the pt with a written personalized care plan for preventive services. Provider list updated.. See scanned questionairre as needed for further documentation. Reviewed preventative protocols and updated unless pt declined.   EKG today - sinus bradycardia 40s, normal axis, intervals, no acute ST/T changes

## 2015-07-29 NOTE — Progress Notes (Signed)
Pre visit review using our clinic review tool, if applicable. No additional management support is needed unless otherwise documented below in the visit note. 

## 2015-07-29 NOTE — Addendum Note (Signed)
Addended by: [name] on: 07/29/2015 10:34 AM   Modules accepted: Orders

## 2015-07-29 NOTE — Assessment & Plan Note (Addendum)
Needs removal. Agrees to referral back to Dr Caryl Comes.

## 2015-07-29 NOTE — Progress Notes (Addendum)
BP 134/62 mmHg  Pulse 54  Temp(Src) 98.2 F (36.8 C) (Oral)  Ht 5' 11.5" (1.816 m)  Wt 182 lb (82.555 kg)  BMI 25.03 kg/m2  SpO2 98%   CC: welcome to medicare visit  Subjective:    Patient ID: Shane [name], male    DOB: [birth year], 64 y.o.   MRN: [id number]  HPI: Shane [name] is a 64 y.o. male presenting on 07/29/2015 for Medicare Wellness   Heart monitor remains in place since 2007. Pt endorses never being contacted by cardiologist's office for removal. Low bp at baseline. Bradycardia 50s, lower at night time. Denies dizziness, dyspnea, chest pain, any more syncopal episodes over last 10 yrs. Stays active outdoors in summer.  Lipoma present for at least 10 years. Affects ROM of neck some. Some discomfort. Would like referral to gen surgery.  Passes hearing and vision screens today. Denies falls, depression, anhedonia, sadness.  Preventative: Colonoscopy remotely normal. Requests iFOB.  Prostate screening - discussed. Declines screening for now.  Not a smoker. Flu shot - declines  Tdap - deferred  Advanced planning - does not have this set up, would like packet of information. Planning on setting up, to see lawyer. Wife would be HCPOA.  Seat belt use discussed  Sunscreen use discussed, no changing moles on skin.  Lives with wife Grown children Occ: retired Engineer, structural, Musician Edu: Bachelor of Art Activity: stays active outdoors working Diet: some water, fruits/vegetables daily  Relevant past medical, surgical, family and social history reviewed and updated as indicated. Interim medical history since our last visit reviewed. Allergies and medications reviewed and updated. No current outpatient prescriptions on file prior to visit.   No current facility-administered medications on file prior to visit.    Review of Systems  Constitutional: Negative for fever, chills, activity change, appetite change, fatigue and unexpected weight change.    HENT: Negative for hearing loss.   Eyes: Negative for visual disturbance.  Respiratory: Negative for cough, chest tightness, shortness of breath and wheezing.   Cardiovascular: Negative for chest pain, palpitations and leg swelling.  Gastrointestinal: Negative for nausea, vomiting, abdominal pain, diarrhea, constipation, blood in stool and abdominal distention.  Genitourinary: Negative for hematuria and difficulty urinating.  Musculoskeletal: Negative for myalgias, arthralgias and neck pain.  Skin: Negative for rash.  Neurological: Negative for dizziness, seizures, syncope and headaches.  Hematological: Negative for adenopathy. Does not bruise/bleed easily.  Psychiatric/Behavioral: Negative for dysphoric mood. The patient is not nervous/anxious.    Per HPI unless specifically indicated above     Objective:    BP 134/62 mmHg  Pulse 54  Temp(Src) 98.2 F (36.8 C) (Oral)  Ht 5' 11.5" (1.816 m)  Wt 182 lb (82.555 kg)  BMI 25.03 kg/m2  SpO2 98%  Wt Readings from Last 3 Encounters:  07/29/15 182 lb (82.555 kg)  01/17/15 189 lb (85.73 kg)    Physical Exam  Constitutional: He is oriented to person, place, and time. He appears well-developed and well-nourished. No distress.  HENT:  Head: Normocephalic and atraumatic.  Right Ear: Hearing, tympanic membrane, external ear and ear canal normal.  Left Ear: Hearing, tympanic membrane, external ear and ear canal normal.  Nose: Nose normal.  Mouth/Throat: Uvula is midline, oropharynx is clear and moist and mucous membranes are normal. No oropharyngeal exudate, posterior oropharyngeal edema or posterior oropharyngeal erythema.  Eyes: Conjunctivae and EOM are normal. Pupils are equal, round, and reactive to light. No scleral icterus.  Neck: Normal range of motion. Neck  supple. Carotid bruit is not present. No thyromegaly present.  Large lipoma posterior cervical neck  Cardiovascular: Normal rate, regular rhythm, normal heart sounds and intact  distal pulses.   No murmur heard. Pulses:      Radial pulses are 2+ on the right side, and 2+ on the left side.  Pulmonary/Chest: Effort normal and breath sounds normal. No respiratory distress. He has no wheezes. He has no rales.  Internal heart monitor in place  Abdominal: Soft. Bowel sounds are normal. He exhibits no distension and no mass. There is no tenderness. There is no rebound and no guarding.  Musculoskeletal: Normal range of motion. He exhibits no edema.  Lymphadenopathy:    He has no cervical adenopathy.  Neurological: He is alert and oriented to person, place, and time.  CN grossly intact, station and gait intact Recall 3/3 Calculation 5/5 serial 7s  Skin: Skin is warm and dry. No rash noted.  Psychiatric: He has a normal mood and affect. His behavior is normal. Judgment and thought content normal.  Nursing note and vitals reviewed.  No results found for this or any previous visit.    Assessment & Plan:   Problem List Items Addressed This Visit    Welcome to Medicare preventive visit - Primary    I have personally reviewed the Medicare Annual Wellness questionnaire and have noted 1. The patient's medical and social history 2. Their use of alcohol, tobacco or illicit drugs 3. Their current medications and supplements 4. The patient's functional ability including ADL's, fall risks, home safety risks and hearing or visual impairment. Cognitive function has been assessed and addressed as indicated.  5. Diet and physical activity 6. Evidence for depression or mood disorders The patients weight, height, BMI have been recorded in the chart. I have made referrals, counseling and provided education to the patient based on review of the above and I have provided the pt with a written personalized care plan for preventive services. Provider list updated.. See scanned questionairre as needed for further documentation. Reviewed preventative protocols and updated unless pt  declined.   EKG today - sinus bradycardia 40s, normal axis, intervals, no acute ST/T changes      Relevant Orders   EKG 12-Lead (Completed)   Advanced care planning/counseling discussion    Advanced planning - does not have this set up, would like packet of information. Planning on setting up, to see lawyer. Wife would be HCPOA.       Bradycardia    asxs.      Relevant Orders   Ambulatory referral to Cardiac Electrophysiology   TSH   History of cardiac monitoring    Needs removal. Agrees to referral back to Dr Caryl Comes.      Relevant Orders   Ambulatory referral to Cardiac Electrophysiology    Other Visit Diagnoses    Lipoma of neck        Relevant Orders    Ambulatory referral to General Surgery    Special screening for malignant neoplasms, colon        Relevant Orders    Fecal occult blood, imunochemical    Family history of hemochromatosis        Relevant Orders    IBC panel    Comprehensive metabolic panel    Lipid screening        Relevant Orders    Lipid panel        Follow up plan: Return in about 1 year (around 07/28/2016), or as needed, for medicare  wellness visit.

## 2015-07-29 NOTE — Assessment & Plan Note (Signed)
Advanced planning - does not have this set up, would like packet of information. Planning on setting up, to see lawyer. Wife would be HCPOA.

## 2015-07-29 NOTE — Patient Instructions (Addendum)
We will refer you back to heart doctor. We will refer you to surgeon for lipoma of neck.  EKG today. Nice to see you today, call us with questions. Pass by lab for stool kit. Return at your convenience for fasting labs. Advanced directive packet provided today.

## 2015-07-29 NOTE — Assessment & Plan Note (Signed)
asxs. 

## 2015-07-30 ENCOUNTER — Other Ambulatory Visit (INDEPENDENT_AMBULATORY_CARE_PROVIDER_SITE_OTHER): Payer: PPO

## 2015-07-30 ENCOUNTER — Encounter: Payer: Self-pay | Admitting: *Deleted

## 2015-07-30 ENCOUNTER — Encounter: Payer: Self-pay | Admitting: Family Medicine

## 2015-07-30 DIAGNOSIS — Z8349 Family history of other endocrine, nutritional and metabolic diseases: Secondary | ICD-10-CM | POA: Diagnosis not present

## 2015-07-30 DIAGNOSIS — Z13220 Encounter for screening for lipoid disorders: Secondary | ICD-10-CM

## 2015-07-30 DIAGNOSIS — R001 Bradycardia, unspecified: Secondary | ICD-10-CM

## 2015-07-30 LAB — COMPREHENSIVE METABOLIC PANEL
ALT: 13 U/L (ref 0–53)
AST: 20 U/L (ref 0–37)
Albumin: 4.2 g/dL (ref 3.5–5.2)
Alkaline Phosphatase: 42 U/L (ref 39–117)
BUN: 18 mg/dL (ref 6–23)
CO2: 27 mEq/L (ref 19–32)
Calcium: 9.5 mg/dL (ref 8.4–10.5)
Chloride: 106 mEq/L (ref 96–112)
Creatinine, Ser: 1.06 mg/dL (ref 0.40–1.50)
GFR: 74.52 mL/min (ref >60.00)
Glucose, Bld: 91 mg/dL (ref 70–99)
Potassium: 4.3 mEq/L (ref 3.5–5.1)
Sodium: 140 mEq/L (ref 135–145)
Total Bilirubin: 0.8 mg/dL (ref 0.2–1.2)
Total Protein: 7 g/dL (ref 6.0–8.3)

## 2015-07-30 LAB — IBC PANEL
Iron: 111 ug/dL (ref 42–165)
Saturation Ratios: 36 % (ref 20.0–50.0)
Transferrin: 220 mg/dL (ref 212.0–360.0)

## 2015-07-30 LAB — LIPID PANEL
Cholesterol: 185 mg/dL (ref 0–200)
HDL: 40.6 mg/dL (ref >39.00)
LDL Cholesterol: 123 mg/dL — ABNORMAL HIGH (ref 0–99)
NonHDL: 144.02
Total CHOL/HDL Ratio: 5
Triglycerides: 105 mg/dL (ref 0.0–149.0)
VLDL: 21 mg/dL (ref 0.0–40.0)

## 2015-07-30 LAB — TSH: TSH: 1.76 u[IU]/mL (ref 0.35–4.50)

## 2015-08-19 ENCOUNTER — Ambulatory Visit (INDEPENDENT_AMBULATORY_CARE_PROVIDER_SITE_OTHER): Payer: PPO | Admitting: Internal Medicine

## 2015-08-19 ENCOUNTER — Other Ambulatory Visit: Payer: PPO

## 2015-08-19 ENCOUNTER — Encounter: Payer: Self-pay | Admitting: Internal Medicine

## 2015-08-19 VITALS — BP 122/80 | HR 57 | Ht 72.0 in | Wt 185.2 lb

## 2015-08-19 DIAGNOSIS — R55 Syncope and collapse: Secondary | ICD-10-CM | POA: Diagnosis not present

## 2015-08-19 DIAGNOSIS — Z1211 Encounter for screening for malignant neoplasm of colon: Secondary | ICD-10-CM

## 2015-08-19 LAB — FECAL OCCULT BLOOD, IMMUNOCHEMICAL: Fecal Occult Bld: NEGATIVE

## 2015-08-19 LAB — FECAL OCCULT BLOOD, GUAIAC: Fecal Occult Blood: NEGATIVE

## 2015-08-19 NOTE — Progress Notes (Signed)
ELECTROPHYSIOLOGY CONSULT NOTE  Patient ID: Shane [name], MRN: [id number], DOB/AGE: 1951/05/03 64 y.o. Admit date: (Not on file) Date of Consult: 08/19/2015  Primary Physician: [name], MD Primary Cardiologist: SK  Chief Complaint: ILR removal   HPI Shane [name] is a 64 y.o. male  Presents after a hiatus of 10 years requesting that his loop recorder be removed. This was implanted 2006. He had 3 symptomatic events never been reviewed with him but which have been interrogated these demonstrated sinus rhythm. He has had no recurrence in the last 10 years.    Past Medical History  Diagnosis Date  . History of chicken pox   . Syncopal episodes 2007    x 2 s/p unrevealing hospitalization without recurrence  . Seasonal allergies       Surgical History:  Past Surgical History  Procedure Laterality Date  . Internal cardiac monitor  2007    due to syncopal episodes - never contacted for f/u     Home Meds: Prior to Admission medications   Not on File      Allergies:  Allergies  Allergen Reactions  . Penicillins Swelling and Rash    Social History   Social History  . Marital Status: Married    Spouse Name: N/A  . Number of Children: N/A  . Years of Education: N/A   Occupational History  . Not on file.   Social History Main Topics  . Smoking status: Never Smoker   . Smokeless tobacco: Never Used  . Alcohol Use: 0.0 oz/week    0 Standard drinks or equivalent per week     Comment: 1 beer most evenings  . Drug Use: No  . Sexual Activity: Not on file   Other Topics Concern  . Not on file   Social History Narrative   Lives with wife   Grown children   Occ: retired Engineer, structural, Musician   Edu: Bachelor of Art   Activity: stays active outdoors working   Diet: some water, fruits/vegetables daily     Family History  Problem Relation Age of Onset  . Hyperlipidemia Mother   . Cancer Mother     peritoneal cancer  .  Hemochromatosis Paternal Uncle   . Hemochromatosis Father     s/p liver transplant - pt screened negative 07/2015  . Hemochromatosis Paternal Grandmother   . Stroke Neg Hx   . Diabetes Neg Hx   . CAD Neg Hx      ROS:  Please see the history of present illness.     All other systems reviewed and negative.    Physical Exam   Blood pressure 122/80, pulse 57, height 6' (1.829 m), weight 185 lb 3.2 oz (84.006 kg). General: Well developed, well nourished male in no acute distress. Head: Normocephalic, atraumatic, sclera non-icteric, no xanthomas, nares are without discharge. EENT: normal Lymph Nodes:  none Back: without scoliosis/kyphosis  no CVA tendersness Neck: Negative for carotid bruits. JVD not elevated. Lungs: Clear bilaterally to auscultation without wheezes, rales, or rhonchi. Breathing is unlabored. Heart: RRR with S1 S2. No murmur , rubs, or gallops appreciated. Abdomen: Soft, non-tender, non-distended with normoactive bowel sounds. No hepatomegaly. No rebound/guarding. No obvious abdominal masses. Msk:  Strength and tone appear normal for age. Extremities: No clubbing or cyanosis. N edema.  Distal pedal pulses are 2+ and equal bilaterally. Skin: Warm and Dry Neuro: Alert and oriented X 3. CN III-XII intact Grossly normal sensory and motor function . Psych:  Responds  to questions appropriately with a normal affect.      Labs: Cardiac Enzymes No results for input(s): CKTOTAL, CKMB, TROPONINI in the last 72 hours. CBC No results found for: WBC, HGB, HCT, MCV, PLT PROTIME: No results for input(s): LABPROT, INR in the last 72 hours. Chemistry No results for input(s): NA, K, CL, CO2, BUN, CREATININE, CALCIUM, PROT, BILITOT, ALKPHOS, ALT, AST, GLUCOSE in the last 168 hours.  Invalid input(s): LABALBU Lipids Lab Results  Component Value Date   CHOL 185 07/30/2015   HDL 40.60 07/30/2015   LDLCALC 123* 07/30/2015   TRIG 105.0 07/30/2015   BNP No results found for:  PROBNP Thyroid Function Tests: No results for input(s): TSH, T4TOTAL, T3FREE, THYROIDAB in the last 72 hours.  Invalid input(s): FREET3    Miscellaneous No results found for: DDIMER  Radiology/Studies:  No results found.  EKG none   Assessment and Plan:   Syncope  ILR  Now at EOL  ( implanted 2006  The patient would like his loop recorder removed. Data done in February. We reviewed the strips from his syncopal episodes from 10 years ago which had never been reviewed with him. He shows sinus rhythm with variable rates    Virl Axe

## 2015-08-19 NOTE — Patient Instructions (Signed)
Medication Instructions: - no changes  Labwork: - none  Procedures/Testing: - We will touch base end of December/ early January to schedule for Loop Recorder Explant- Alvis Lemmings, RN [phone]  Follow-Up: - to be determined  Any Additional Special Instructions Will Be Listed Below (If Applicable). - none

## 2015-08-20 ENCOUNTER — Encounter: Payer: Self-pay | Admitting: *Deleted

## 2015-08-25 ENCOUNTER — Telehealth: Payer: Self-pay | Admitting: Internal Medicine

## 2015-08-25 NOTE — Telephone Encounter (Signed)
I spoke with the patient.  He was calling to inquire if his ILR explant would be covered or if he would be paying out of pocket for this. I advised him that I have not even submitted this to precert yet as he wants this done in February 2017 and this is so far out. He will call me back in December and we will check coverage and set up his procedure. He is agreeable with this.

## 2015-08-25 NOTE — Telephone Encounter (Signed)
New message   Pt is calling to see if his information has approval  Before making another appt  Gave him the number to billing [phone]

## 2015-11-26 ENCOUNTER — Ambulatory Visit (INDEPENDENT_AMBULATORY_CARE_PROVIDER_SITE_OTHER): Payer: PPO | Admitting: Family Medicine

## 2015-11-26 ENCOUNTER — Encounter: Payer: Self-pay | Admitting: Family Medicine

## 2015-11-26 VITALS — BP 112/60 | HR 66 | Temp 98.3°F | Wt 187.0 lb

## 2015-11-26 DIAGNOSIS — J209 Acute bronchitis, unspecified: Secondary | ICD-10-CM | POA: Diagnosis not present

## 2015-11-26 MED ORDER — HYDROCODONE-HOMATROPINE 5-1.5 MG/5ML PO SYRP: 5.0000 mL | ORAL_SOLUTION | Freq: Every evening | ORAL | Status: DC | PRN

## 2015-11-26 MED ORDER — AZITHROMYCIN 250 MG PO TABS: ORAL_TABLET | ORAL | Status: DC

## 2015-11-26 MED ORDER — HYDROCORTISONE 2.5 % RE CREA: 1.0000 "application " | TOPICAL_CREAM | Freq: Every day | RECTAL | Status: DC

## 2015-11-26 MED ORDER — POLYETHYLENE GLYCOL 3350 17 GM/SCOOP PO POWD: 17.0000 g | Freq: Every day | ORAL | Status: DC | PRN

## 2015-11-26 NOTE — Patient Instructions (Addendum)
I think you have acute bronchitis  Use medication as prescribed: zpack, hydrocodone cough syrup. Push fluids and plenty of rest. Take plain mucinex with plenty of water to help mobilize mucous. watc hfor high fevers (>101.5) or difficulty swallowing or worsening productive cough. Call clinic with questions.  Good to see you today. I hope you start feeling better soon.

## 2015-11-26 NOTE — Assessment & Plan Note (Signed)
Given severity of coughing fits will cover for atypical infection with zpack. Hycodan syrup for cough - discussed sedation precautions. Further supportive care as per instructions. Update if not improving with treatmnet.

## 2015-11-26 NOTE — Progress Notes (Signed)
   BP 112/60 mmHg  Pulse 66  Temp(Src) 98.3 F (36.8 C) (Oral)  Wt 187 lb (84.823 kg)  SpO2 96%   CC: cough  Subjective:    Patient ID: Shane [name], male    DOB: 1951/09/04, 64 y.o.   MRN: [id number]  HPI: Shane [name] is a 64 y.o. male presenting on 11/26/2015 for Cough   11/17/2015 started feeling ill - joint pains, body aches, fever to 102.7. Persistent nonproductive cough since then. Lower back pain, headache from cough. Congestion and ST have improved. Hoarse voice and dyspnea with coughing fits. Minimal wheezing. + coughing fits. Tickle triggers cough.   No more fevers, no chills. No ear or tooth pain.  So far tried OTC remedies including vicks, nyquil.  No h/o asthma  No smokers at home. No sick contacts at home. Did not receive flu shot.  Relevant past medical, surgical, family and social history reviewed and updated as indicated. Interim medical history since our last visit reviewed. Allergies and medications reviewed and updated. No current outpatient prescriptions on file prior to visit.   No current facility-administered medications on file prior to visit.    Review of Systems Per HPI unless specifically indicated in ROS section     Objective:    BP 112/60 mmHg  Pulse 66  Temp(Src) 98.3 F (36.8 C) (Oral)  Wt 187 lb (84.823 kg)  SpO2 96%  Wt Readings from Last 3 Encounters:  11/26/15 187 lb (84.823 kg)  08/19/15 185 lb 3.2 oz (84.006 kg)  07/29/15 182 lb (82.555 kg)    Physical Exam  Constitutional: He appears well-developed and well-nourished. No distress.  HENT:  Head: Normocephalic and atraumatic.  Right Ear: Hearing, external ear and ear canal normal.  Left Ear: Hearing, external ear and ear canal normal.  Nose: Mucosal edema present. No rhinorrhea. Right sinus exhibits no maxillary sinus tenderness and no frontal sinus tenderness. Left sinus exhibits no maxillary sinus tenderness and no frontal sinus tenderness.  Mouth/Throat: Uvula is  midline, oropharynx is clear and moist and mucous membranes are normal. No oropharyngeal exudate, posterior oropharyngeal edema, posterior oropharyngeal erythema or tonsillar abscesses.  Fluid behind TMs bilaterally  Eyes: Conjunctivae and EOM are normal. Pupils are equal, round, and reactive to light. No scleral icterus.  Neck: Normal range of motion. Neck supple.  Cardiovascular: Normal rate, regular rhythm, normal heart sounds and intact distal pulses.   No murmur heard. Pulmonary/Chest: Effort normal and breath sounds normal. No respiratory distress. He has no wheezes. He has no rales.  Lungs clear. + coughing fit present  Lymphadenopathy:    He has no cervical adenopathy.  Skin: Skin is warm and dry. No rash noted.  Nursing note and vitals reviewed.     Assessment & Plan:   Problem List Items Addressed This Visit    Acute bronchitis - Primary    Given severity of coughing fits will cover for atypical infection with zpack. Hycodan syrup for cough - discussed sedation precautions. Further supportive care as per instructions. Update if not improving with treatmnet.          Follow up plan: Return if symptoms worsen or fail to improve.

## 2015-11-27 ENCOUNTER — Telehealth: Payer: Self-pay

## 2015-11-27 NOTE — Telephone Encounter (Signed)
Yes, he may take the Hycodan every 8 hours as needed for cough. He must be cautious as this will make him drowsy.

## 2015-11-27 NOTE — Telephone Encounter (Signed)
Shane [name] left v/m; pt has a bad cough during the day and wants to know if can take Hycodan during the day if not operating vehicle or machinery; pt also wants to take at bedtime. Instructions are one tsp at hs prn.Please advise. Dr Darnell Level is out of office.Please advise.

## 2015-11-27 NOTE — Telephone Encounter (Signed)
Called and notified patient's wife of Kate's comments. Patient's wife verbalized understanding.  

## 2015-12-02 ENCOUNTER — Telehealth: Payer: Self-pay

## 2015-12-02 NOTE — Telephone Encounter (Signed)
Ms Smutny left v/m; pt was seen 11/26/15 and pt was taking abx Zpak; after taking 3 doses pt broke out in rash; pt stopped taking abx on 11/28/15; pt missed last 2 doses of abx. Pt is also taking cough med. Wants to know if pt needs to get a different abx. [employer]. Ms Collinsworth request cb.

## 2015-12-02 NOTE — Telephone Encounter (Signed)
Spoke with patient. Rash likely to zpack. Continues hycodan. Cough continues improving. Discussed likely to stay off abx. Update if worsening cough or worsening rash.

## 2016-01-01 ENCOUNTER — Ambulatory Visit (INDEPENDENT_AMBULATORY_CARE_PROVIDER_SITE_OTHER): Payer: PPO | Admitting: Internal Medicine

## 2016-01-01 ENCOUNTER — Encounter: Payer: Self-pay | Admitting: Internal Medicine

## 2016-01-01 VITALS — BP 140/68 | HR 62 | Ht 72.0 in | Wt 189.4 lb

## 2016-01-01 DIAGNOSIS — R55 Syncope and collapse: Secondary | ICD-10-CM

## 2016-01-01 NOTE — Patient Instructions (Signed)
Medication Instructions: No changes  Labwork: None  Procedures/Testing: Please notify us when your surgery is scheduled and we will coordinate loop explant.  Follow-Up: Your physician recommends that you schedule a follow-up appointment 10 days post explant for a wound check   Any Additional Special Instructions Will Be Listed Below (If Applicable)..     If you need a refill on your cardiac medications before your next appointment, please call your pharmacy.

## 2016-01-01 NOTE — Progress Notes (Signed)
      Patient Care Team: [name], MD as PCP - General (Family Medicine)   HPI  Shane [name] is a 65 y.o. male Is seen in follow-up for request to have his loop recorder explanted. This visit was in September.  He would like to pursue that at this juncture. He is also scheduled to undergo surgery for the resection of a lipoma.  He has no exercise intolerance. He denies chest pain shortness of breath or peripheral edema.    Past Medical History  Diagnosis Date  . History of chicken pox   . Syncopal episodes 2007    x 2 s/p unrevealing hospitalization without recurrence  . Seasonal allergies     Past Surgical History  Procedure Laterality Date  . Internal cardiac monitor  2007    due to syncopal episodes - never contacted for f/u    No current outpatient prescriptions on file.   No current facility-administered medications for this visit.    Allergies  Allergen Reactions  . Penicillins Swelling and Rash  . Zithromax [Azithromycin] Rash      Review of Systems negative except from HPI and PMH  Physical Exam BP 140/68 mmHg  Pulse 62  Ht 6' (1.829 m)  Wt 189 lb 6.4 oz (85.911 kg)  BMI 25.68 kg/m2 Well developed and well nourished in no acute distress HENT normal E scleral and icterus clear Neck Supple JVP flat; carotids brisk and full Lipoma on neck Clear to ausculation  Regular rate and rhythm, no murmurs gallops or rub Soft with active bowel sounds No clubbing cyanosis none Edema Alert and oriented, grossly normal motor and sensory function Skin Warm and Dry  NSR t : 58 19/09/40  Assessment and  Plan ILR  EOL  Preoperative clearance  Will arrange for surgical explant. We will try and coordinate it for the time of his lipoma resection. This will minimize his hospitalizations.  His cardiovascular risk should be acceptable. He has excellent functional capacity.

## 2016-01-13 ENCOUNTER — Telehealth: Payer: Self-pay | Admitting: *Deleted

## 2016-01-13 NOTE — Telephone Encounter (Signed)
Colletta Maryland calling from [facility] calling stating pt came into their office stating when pt does lipoma removal  procedure  he menitoned to them that Dr Caryl Comes stated to him that they would be able to get his heart Monitor taken out at the same time. Would like to know more about that  Please call back .

## 2016-01-13 NOTE — Telephone Encounter (Signed)
I called and spoke with Colletta Maryland at [place] scheduling. I advised her of how our procedure works and that this would need to be done in the EP lab at [facility]. She is going to make some calls to OR scheduling to see if this can be coordinated with his lipoma removal. She will call me back to update me.

## 2016-01-28 ENCOUNTER — Telehealth: Payer: Self-pay | Admitting: Internal Medicine

## 2016-01-28 ENCOUNTER — Other Ambulatory Visit: Payer: Self-pay | Admitting: General Surgery

## 2016-01-28 DIAGNOSIS — R55 Syncope and collapse: Secondary | ICD-10-CM

## 2016-01-28 DIAGNOSIS — Z01812 Encounter for preprocedural laboratory examination: Secondary | ICD-10-CM

## 2016-01-28 NOTE — Telephone Encounter (Signed)
Not here next week   Can schedule anytime that works for his schedule and mine

## 2016-01-28 NOTE — Telephone Encounter (Signed)
New message   Pt calling for RN please return call  Heart monitor removal

## 2016-01-28 NOTE — Telephone Encounter (Signed)
PT  CALLED  AND  STATED  UNABLE  TO DO LOOP RECORDER  EXTRACTION  ON SAME  DAY AS  NECK SURGERY .PER PT IS  AVAILABLE NEXT  WEEK  IF THERE  IS  ANY AVAILABILITY  IN  DR [facility]  SCHEDULE  .WILL FORWARD  TO HEATHER MCGHEE RN FOR  REVIEW./CY

## 2016-02-03 ENCOUNTER — Encounter (HOSPITAL_BASED_OUTPATIENT_CLINIC_OR_DEPARTMENT_OTHER): Payer: Self-pay | Admitting: *Deleted

## 2016-02-04 NOTE — Telephone Encounter (Signed)
Calling to speak with Shane [name] regarding his loop recorder.  Advised that Nira Conn not in office today but will forward message to her to follow up.  He verbalizes understanding.

## 2016-02-04 NOTE — Telephone Encounter (Signed)
F/u  Pt following up on loop removal- requested to speak w/ Heather. Please call back and discuss.

## 2016-02-05 ENCOUNTER — Encounter: Payer: Self-pay | Admitting: *Deleted

## 2016-02-05 NOTE — Telephone Encounter (Signed)
I spoke with the patient and scheduled him for his ILR explant. He is set for 02/25/16 at 8:30 am. He will have labs done at the lab at [facility] on 02/18/16. Verbal instructions given to patient today- letter also mailed to the patient.  Mailing address confirmed with the patient.

## 2016-02-05 NOTE — Telephone Encounter (Signed)
See 01/28/16 phone note.

## 2016-02-09 ENCOUNTER — Encounter (HOSPITAL_BASED_OUTPATIENT_CLINIC_OR_DEPARTMENT_OTHER): Payer: Self-pay | Admitting: Certified Registered"

## 2016-02-09 ENCOUNTER — Ambulatory Visit (HOSPITAL_BASED_OUTPATIENT_CLINIC_OR_DEPARTMENT_OTHER): Payer: PPO | Admitting: Certified Registered"

## 2016-02-09 ENCOUNTER — Ambulatory Visit (HOSPITAL_BASED_OUTPATIENT_CLINIC_OR_DEPARTMENT_OTHER)
Admission: RE | Admit: 2016-02-09 | Discharge: 2016-02-09 | Disposition: A | Discharge status: Home / Self Care | Payer: PPO | Source: Ambulatory Visit | Attending: General Surgery | Admitting: General Surgery

## 2016-02-09 ENCOUNTER — Encounter (HOSPITAL_BASED_OUTPATIENT_CLINIC_OR_DEPARTMENT_OTHER)
Admission: RE | Disposition: A | Discharge status: Home / Self Care | Payer: Self-pay | Source: Ambulatory Visit | Attending: General Surgery

## 2016-02-09 DIAGNOSIS — D170 Benign lipomatous neoplasm of skin and subcutaneous tissue of head, face and neck: Secondary | ICD-10-CM | POA: Insufficient documentation

## 2016-02-09 HISTORY — DX: Benign lipomatous neoplasm of skin and subcutaneous tissue of head, face and neck: D17.0

## 2016-02-09 HISTORY — PX: LIPOMA EXCISION: SHX5283

## 2016-02-09 SURGERY — EXCISION LIPOMA
Anesthesia: General | Site: Neck

## 2016-02-09 MED ORDER — EPHEDRINE SULFATE 50 MG/ML IJ SOLN
INTRAMUSCULAR | Status: DC | PRN
  Administered 2016-02-09: 10 mg via INTRAVENOUS

## 2016-02-09 MED ORDER — PROPOFOL 10 MG/ML IV BOLUS
INTRAVENOUS | Status: DC | PRN
  Administered 2016-02-09: 200 mg via INTRAVENOUS

## 2016-02-09 MED ORDER — LIDOCAINE HCL (CARDIAC) 20 MG/ML IV SOLN
INTRAVENOUS | Status: AC
  Filled 2016-02-09: qty 5

## 2016-02-09 MED ORDER — ONDANSETRON HCL 4 MG/2ML IJ SOLN
INTRAMUSCULAR | Status: AC
  Filled 2016-02-09: qty 2

## 2016-02-09 MED ORDER — EPHEDRINE SULFATE 50 MG/ML IJ SOLN
INTRAMUSCULAR | Status: AC
  Filled 2016-02-09: qty 1

## 2016-02-09 MED ORDER — VANCOMYCIN HCL IN DEXTROSE 1-5 GM/200ML-% IV SOLN
INTRAVENOUS | Status: AC
  Filled 2016-02-09: qty 200

## 2016-02-09 MED ORDER — BUPIVACAINE-EPINEPHRINE (PF) 0.25% -1:200000 IJ SOLN
INTRAMUSCULAR | Status: AC
  Filled 2016-02-09: qty 30

## 2016-02-09 MED ORDER — GLYCOPYRROLATE 0.2 MG/ML IJ SOLN: 0.2000 mg | Freq: Once | INTRAMUSCULAR | Status: DC | PRN

## 2016-02-09 MED ORDER — OXYCODONE-ACETAMINOPHEN 5-325 MG PO TABS: 1.0000 | ORAL_TABLET | ORAL | Status: DC | PRN

## 2016-02-09 MED ORDER — LACTATED RINGERS IV SOLN
INTRAVENOUS | Status: DC
  Administered 2016-02-09 (×2): via INTRAVENOUS

## 2016-02-09 MED ORDER — VANCOMYCIN HCL IN DEXTROSE 1-5 GM/200ML-% IV SOLN
1000.0000 mg | INTRAVENOUS | Status: AC
  Administered 2016-02-09: 1000 mg via INTRAVENOUS

## 2016-02-09 MED ORDER — ONDANSETRON HCL 4 MG/2ML IJ SOLN
INTRAMUSCULAR | Status: DC | PRN
  Administered 2016-02-09: 4 mg via INTRAVENOUS

## 2016-02-09 MED ORDER — FENTANYL CITRATE (PF) 100 MCG/2ML IJ SOLN
INTRAMUSCULAR | Status: AC
  Filled 2016-02-09: qty 2

## 2016-02-09 MED ORDER — BUPIVACAINE-EPINEPHRINE 0.25% -1:200000 IJ SOLN
INTRAMUSCULAR | Status: DC | PRN
  Administered 2016-02-09: 19 mL

## 2016-02-09 MED ORDER — CHLORHEXIDINE GLUCONATE 4 % EX LIQD: 1.0000 "application " | Freq: Once | CUTANEOUS | Status: DC

## 2016-02-09 MED ORDER — MIDAZOLAM HCL 2 MG/2ML IJ SOLN: 1.0000 mg | INTRAMUSCULAR | Status: DC | PRN

## 2016-02-09 MED ORDER — FENTANYL CITRATE (PF) 100 MCG/2ML IJ SOLN
50.0000 ug | INTRAMUSCULAR | Status: DC | PRN
  Administered 2016-02-09 (×2): 50 ug via INTRAVENOUS

## 2016-02-09 MED ORDER — DEXAMETHASONE SODIUM PHOSPHATE 4 MG/ML IJ SOLN
INTRAMUSCULAR | Status: DC | PRN
  Administered 2016-02-09: 10 mg via INTRAVENOUS

## 2016-02-09 MED ORDER — LIDOCAINE HCL (CARDIAC) 20 MG/ML IV SOLN
INTRAVENOUS | Status: DC | PRN
  Administered 2016-02-09: 60 mg via INTRAVENOUS

## 2016-02-09 MED ORDER — DEXAMETHASONE SODIUM PHOSPHATE 10 MG/ML IJ SOLN
INTRAMUSCULAR | Status: AC
  Filled 2016-02-09: qty 1

## 2016-02-09 MED ORDER — SCOPOLAMINE 1 MG/3DAYS TD PT72: 1.0000 | MEDICATED_PATCH | Freq: Once | TRANSDERMAL | Status: DC | PRN

## 2016-02-09 SURGICAL SUPPLY — 44 items
BLADE SURG 10 STRL SS (BLADE) IMPLANT
BLADE SURG 15 STRL LF DISP TIS (BLADE) ×1 IMPLANT
BLADE SURG 15 STRL SS (BLADE) ×2
CANISTER SUCT 1200ML W/VALVE (MISCELLANEOUS) IMPLANT
CHLORAPREP W/TINT 26ML (MISCELLANEOUS) ×3 IMPLANT
COVER BACK TABLE 60X90IN (DRAPES) ×3 IMPLANT
COVER MAYO STAND STRL (DRAPES) ×3 IMPLANT
DECANTER SPIKE VIAL GLASS SM (MISCELLANEOUS) IMPLANT
DRAPE LAPAROTOMY 100X72 PEDS (DRAPES) ×3 IMPLANT
DRAPE UTILITY XL STRL (DRAPES) ×3 IMPLANT
ELECT COATED BLADE 2.86 ST (ELECTRODE) ×3 IMPLANT
ELECT REM PT RETURN 9FT ADLT (ELECTROSURGICAL) ×3
ELECTRODE REM PT RTRN 9FT ADLT (ELECTROSURGICAL) ×1 IMPLANT
GAUZE SPONGE 4X4 16PLY XRAY LF (GAUZE/BANDAGES/DRESSINGS) IMPLANT
GLOVE BIO SURGEON STRL SZ7.5 (GLOVE) ×3 IMPLANT
GOWN STRL REUS W/ TWL LRG LVL3 (GOWN DISPOSABLE) ×2 IMPLANT
GOWN STRL REUS W/TWL LRG LVL3 (GOWN DISPOSABLE) ×4
LIQUID BAND (GAUZE/BANDAGES/DRESSINGS) ×3 IMPLANT
NEEDLE HYPO 25X1 1.5 SAFETY (NEEDLE) ×3 IMPLANT
NS IRRIG 1000ML POUR BTL (IV SOLUTION) ×3 IMPLANT
PACK BASIN DAY SURGERY FS (CUSTOM PROCEDURE TRAY) ×3 IMPLANT
PENCIL BUTTON HOLSTER BLD 10FT (ELECTRODE) ×3 IMPLANT
SLEEVE SCD COMPRESS KNEE MED (MISCELLANEOUS) ×3 IMPLANT
SPONGE LAP 18X18 X RAY DECT (DISPOSABLE) IMPLANT
SUT CHROMIC 3 0 SH 27 (SUTURE) IMPLANT
SUT ETHILON 3 0 PS 1 (SUTURE) IMPLANT
SUT MON AB 4-0 PC3 18 (SUTURE) ×3 IMPLANT
SUT PROLENE 3 0 PS 2 (SUTURE) IMPLANT
SUT SILK 2 0 FS (SUTURE) IMPLANT
SUT VIC AB 3-0 54X BRD REEL (SUTURE) IMPLANT
SUT VIC AB 3-0 BRD 54 (SUTURE)
SUT VIC AB 3-0 FS2 27 (SUTURE) IMPLANT
SUT VIC AB 3-0 SH 27 (SUTURE)
SUT VIC AB 3-0 SH 27X BRD (SUTURE) IMPLANT
SUT VIC AB 4-0 RB1 27 (SUTURE)
SUT VIC AB 4-0 RB1 27X BRD (SUTURE) IMPLANT
SUT VICRYL 4-0 PS2 18IN ABS (SUTURE) IMPLANT
SUT VICRYL AB 3 0 TIES (SUTURE) IMPLANT
SYR CONTROL 10ML LL (SYRINGE) ×3 IMPLANT
TOWEL OR 17X24 6PK STRL BLUE (TOWEL DISPOSABLE) ×6 IMPLANT
TOWEL OR NON WOVEN STRL DISP B (DISPOSABLE) IMPLANT
TUBE CONNECTING 20'X1/4 (TUBING) ×1
TUBE CONNECTING 20X1/4 (TUBING) ×2 IMPLANT
YANKAUER SUCT BULB TIP NO VENT (SUCTIONS) IMPLANT

## 2016-02-09 NOTE — Discharge Instructions (Signed)

## 2016-02-09 NOTE — Interval H&P Note (Signed)
History and Physical Interval Note:  02/09/2016 12:19 PM  Shane [name]  has presented today for surgery, with the diagnosis of lipoma neck  The various methods of treatment have been discussed with the patient and family. After consideration of risks, benefits and other options for treatment, the patient has consented to  Procedure(s): EXCISION NECK LIPOMA (N/A) as a surgical intervention .  The patient's history has been reviewed, patient examined, no change in status, stable for surgery.  I have reviewed the patient's chart and labs.  Questions were answered to the patient's satisfaction.     TOTH III,PAUL S

## 2016-02-09 NOTE — H&P (Signed)
Shane [name]  Location: [facility] Patient #: [id number] DOB: [birth year] Married / Language: English / Race: White Male   History of Present Illness Patient words: neck lipoma.  The patient is a 65 year old male who presents with a neck mass. We are asked to see the patient in consultation by Dr. Danise Mina to evaluate him for a lipoma on his neck. The patient is a 65 year old white male who has noticed a mass on the back of his neck for about the last 10 years. Over the last year he feels as though it has enlarged significantly. It is beginning to cause him some discomfort when he moves his neck. He has otherwise been in pretty good health. He did have a few episodes of passing out years ago and had a recorder implanted in his left chest wall but he states the cardiologist never did anything with that in the recorder is now dead. He denies any chest pains. He has not had any fainting spells and years. He is planning to have Dr. Caryl Comes remove the recorder in the near future.   Past Surgical History  No pertinent past surgical history  Diagnostic Studies History  Colonoscopy >10 years ago  Allergies  Penicillamine *ASSORTED CLASSES*  Medication History  No Current Medications Medications Reconciled  Social History Alcohol use Occasional alcohol use. Caffeine use Carbonated beverages, Coffee, Tea. No drug use Tobacco use Never smoker.  Family History  Cancer Mother.    Review of Systems  General Not Present- Appetite Loss, Chills, Fatigue, Fever, Night Sweats, Weight Gain and Weight Loss. Skin Not Present- Change in Wart/Mole, Dryness, Hives, Jaundice, New Lesions, Non-Healing Wounds, Rash and Ulcer. HEENT Present- Seasonal Allergies and Wears glasses/contact lenses. Not Present- Earache, Hearing Loss, Hoarseness, Nose Bleed, Oral Ulcers, Ringing in the Ears, Sinus Pain, Sore Throat, Visual Disturbances and Yellow Eyes. Respiratory Not  Present- Bloody sputum, Chronic Cough, Difficulty Breathing, Snoring and Wheezing. Breast Not Present- Breast Mass, Breast Pain, Nipple Discharge and Skin Changes. Cardiovascular Not Present- Chest Pain, Difficulty Breathing Lying Down, Leg Cramps, Palpitations, Rapid Heart Rate, Shortness of Breath and Swelling of Extremities. Gastrointestinal Not Present- Abdominal Pain, Bloating, Bloody Stool, Change in Bowel Habits, Chronic diarrhea, Constipation, Difficulty Swallowing, Excessive gas, Gets full quickly at meals, Hemorrhoids, Indigestion, Nausea, Rectal Pain and Vomiting. Male Genitourinary Not Present- Blood in Urine, Change in Urinary Stream, Frequency, Impotence, Nocturia, Painful Urination, Urgency and Urine Leakage. Musculoskeletal Not Present- Back Pain, Joint Pain, Joint Stiffness, Muscle Pain, Muscle Weakness and Swelling of Extremities. Neurological Not Present- Decreased Memory, Fainting, Headaches, Numbness, Seizures, Tingling, Tremor, Trouble walking and Weakness. Psychiatric Not Present- Anxiety, Bipolar, Change in Sleep Pattern, Depression, Fearful and Frequent crying. Endocrine Not Present- Cold Intolerance, Excessive Hunger, Hair Changes, Heat Intolerance, Hot flashes and New Diabetes. Hematology Not Present- Easy Bruising, Excessive bleeding, Gland problems, HIV and Persistent Infections.  Vitals Weight: 185 lb Height: 72in Body Surface Area: 2.06 m Body Mass Index: 25.09 kg/m  Temp.: 98.42F(Temporal)  Pulse: 76 (Regular)  BP: 128/84 (Sitting, Left Arm, Standard)     Physical Exam  General Mental Status-Alert. General Appearance-Consistent with stated age. Hydration-Well hydrated. Voice-Normal.  Head and Neck Head-normocephalic, atraumatic with no lesions or palpable masses. Trachea-midline. Thyroid Gland Characteristics - normal size and consistency. Note: There is a 5 cm palpable fatty feeling mass in the right posterior inferior neck  region that is well-circumscribed.   Eye Eyeball - Bilateral-Extraocular movements intact. Sclera/Conjunctiva - Bilateral-No scleral icterus.  Chest  and Lung Exam Chest and lung exam reveals -quiet, even and easy respiratory effort with no use of accessory muscles and on auscultation, normal breath sounds, no adventitious sounds and normal vocal resonance. Inspection Chest Wall - Normal. Back - normal.  Cardiovascular Cardiovascular examination reveals -normal heart sounds, regular rate and rhythm with no murmurs and normal pedal pulses bilaterally.  Abdomen Inspection Inspection of the abdomen reveals - No Hernias. Skin - Scar - no surgical scars. Palpation/Percussion Palpation and Percussion of the abdomen reveal - Soft, Non Tender, No Rebound tenderness, No Rigidity (guarding) and No hepatosplenomegaly. Auscultation Auscultation of the abdomen reveals - Bowel sounds normal.  Neurologic Neurologic evaluation reveals -alert and oriented x 3 with no impairment of recent or remote memory. Mental Status-Normal.  Musculoskeletal Normal Exam - Left-Upper Extremity Strength Normal and Lower Extremity Strength Normal. Normal Exam - Right-Upper Extremity Strength Normal and Lower Extremity Strength Normal.  Lymphatic Head & Neck  General Head & Neck Lymphatics: Bilateral - Description - Normal. Axillary  General Axillary Region: Bilateral - Description - Normal. Tenderness - Non Tender. Femoral & Inguinal  Generalized Femoral & Inguinal Lymphatics: Bilateral - Description - Normal. Tenderness - Non Tender.    Assessment & Plan  LIPOMA OF SKIN AND SUBCUTANEOUS TISSUE OF NECK (D17.0) Impression: The patient has about a 5 cm lipoma on his right posterior neck that is causing him discomfort and has been gradually enlarging. For this reason of the get be reasonable to remove the mass. I have discussed with him in detail the risks and benefits of the operation as well  as some of the technical aspects and he understands and wishes to proceed    Signed by Luella Cook, MD

## 2016-02-09 NOTE — Anesthesia Procedure Notes (Signed)
Procedure Name: LMA Insertion Date/Time: 02/09/2016 12:47 PM Performed by: Matraca Hunkins D Pre-anesthesia Checklist: Patient identified, Emergency Drugs available, Suction available and Patient being monitored Patient Re-evaluated:Patient Re-evaluated prior to inductionOxygen Delivery Method: Circle System Utilized Preoxygenation: Pre-oxygenation with 100% oxygen Intubation Type: IV induction Ventilation: Mask ventilation without difficulty LMA: LMA inserted LMA Size: 4.0 Number of attempts: 1 Airway Equipment and Method: Bite block Placement Confirmation: positive ETCO2 Tube secured with: Tape Dental Injury: Teeth and Oropharynx as per pre-operative assessment

## 2016-02-09 NOTE — Op Note (Signed)
02/09/2016  1:27 PM  PATIENT:  Shane [name]  65 y.o. male  PRE-OPERATIVE DIAGNOSIS:  lipoma neck  POST-OPERATIVE DIAGNOSIS:  lipoma neck 5cm  PROCEDURE:  Procedure(s): EXCISION NECK LIPOMA (N/A)  SURGEON:  Surgeon(s) and Role:    * Jovita Kussmaul, MD - Primary  PHYSICIAN ASSISTANT:   ASSISTANTS: none   ANESTHESIA:   general  EBL:  Total I/O In: 1000 [I.V.:1000] Out: -   BLOOD ADMINISTERED:none  DRAINS: none   LOCAL MEDICATIONS USED:  MARCAINE     SPECIMEN:  Source of Specimen:  lipoma neck  DISPOSITION OF SPECIMEN:  PATHOLOGY  COUNTS:  YES  TOURNIQUET:  * No tourniquets in log *  DICTATION: .Dragon Dictation   After informed consent was obtained the patient was brought to the operating room and placed in the supine position on the operating room table. After adequate induction of general anesthesia the patient was moved into a right side up lateral position on a beanbag and all pressure points were padded. The posterior neck area was then prepped with ChloraPrep, allowed to dry, and draped in usual sterile manner. The area around the palpable lipoma was infiltrated with quarter percent Marcaine. A small transversely oriented incision was made overlying the palpable lipoma with a 15 blade knife. The incision was carried through the skin and subcutaneous tissue sharply with electrocautery until the lipoma was encountered. The lipoma was separated from the rest of the subcutaneous tissue by sharp dissection with the electrocautery. Once the lipoma was completely removed it was measured and measured 5 cm across. It was sent to pathology for further evaluation. Hemostasis was achieved using the Bovie electrocautery. The wound was infiltrated with more quarter percent Marcaine. The deep layer of the wound was then closed with layers of interrupted 3-0 Vicryl stitches. The skin was then closed with a running 4-0 Monocryl subcuticular stitch. Dermabond dressings were applied. The  patient tolerated the procedure well. At the end of the case all needle sponge and instrument counts were correct. The patient was then awakened and taken to recovery in stable condition.  PLAN OF CARE: Discharge to home after PACU  PATIENT DISPOSITION:  PACU - hemodynamically stable.   Delay start of Pharmacological VTE agent (>24hrs) due to surgical blood loss or risk of bleeding: not applicable

## 2016-02-09 NOTE — Anesthesia Preprocedure Evaluation (Signed)
Anesthesia Evaluation  Patient identified by MRN, date of birth, ID band Patient awake    Reviewed: Allergy & Precautions, NPO status , Patient's Chart, lab work & pertinent test results  Airway Mallampati: I  TM Distance: >3 FB Neck ROM: Full    Dental  (+) Teeth Intact, Dental Advisory Given   Pulmonary    breath sounds clear to auscultation       Cardiovascular  Rhythm:Regular Rate:Normal     Neuro/Psych    GI/Hepatic   Endo/Other    Renal/GU      Musculoskeletal   Abdominal   Peds  Hematology   Anesthesia Other Findings   Reproductive/Obstetrics                             Anesthesia Physical Anesthesia Plan  ASA: II  Anesthesia Plan: General   Post-op Pain Management:    Induction: Intravenous  Airway Management Planned: Oral ETT and LMA  Additional Equipment:   Intra-op Plan:   Post-operative Plan: Extubation in OR  Informed Consent: I have reviewed the patients History and Physical, chart, labs and discussed the procedure including the risks, benefits and alternatives for the proposed anesthesia with the patient or authorized representative who has indicated his/her understanding and acceptance.   Dental advisory given  Plan Discussed with: CRNA and Anesthesiologist  Anesthesia Plan Comments:         Anesthesia Quick Evaluation

## 2016-02-09 NOTE — Transfer of Care (Signed)
Immediate Anesthesia Transfer of Care Note  Patient: Shane [name]  Procedure(s) Performed: Procedure(s): EXCISION NECK LIPOMA (N/A)  Patient Location: PACU  Anesthesia Type:General  Level of Consciousness: awake and patient cooperative  Airway & Oxygen Therapy: Patient Spontanous Breathing and Patient connected to face mask oxygen  Post-op Assessment: Report given to RN and Post -op Vital signs reviewed and stable  Post vital signs: Reviewed and stable  Last Vitals:  Filed Vitals:   02/09/16 1142 02/09/16 1332  BP: 153/85   Pulse:  76  Temp:    Resp:      Complications: No apparent anesthesia complications

## 2016-02-09 NOTE — Anesthesia Postprocedure Evaluation (Signed)
Anesthesia Post Note  Patient: Delvon Ambroise  Procedure(s) Performed: Procedure(s) (LRB): EXCISION NECK LIPOMA (N/A)  Patient location during evaluation: PACU Anesthesia Type: General Level of consciousness: awake and alert Pain management: pain level controlled Vital Signs Assessment: post-procedure vital signs reviewed and stable Respiratory status: spontaneous breathing, nonlabored ventilation and respiratory function stable Cardiovascular status: blood pressure returned to baseline and stable Postop Assessment: no signs of nausea or vomiting Anesthetic complications: no    Last Vitals:  Filed Vitals:   02/09/16 1345 02/09/16 1400  BP: 141/77 148/68  Pulse: 89 77  Temp:    Resp: 16 17    Last Pain:  Filed Vitals:   02/09/16 1406  PainSc: 0-No pain                 Jymir Dunaj A

## 2016-02-10 ENCOUNTER — Encounter (HOSPITAL_BASED_OUTPATIENT_CLINIC_OR_DEPARTMENT_OTHER): Payer: Self-pay | Admitting: General Surgery

## 2016-02-18 ENCOUNTER — Other Ambulatory Visit
Admission: RE | Admit: 2016-02-18 | Discharge: 2016-02-18 | Disposition: A | Discharge status: Home / Self Care | Payer: PPO | Source: Ambulatory Visit | Attending: Internal Medicine | Admitting: Internal Medicine

## 2016-02-18 DIAGNOSIS — R55 Syncope and collapse: Secondary | ICD-10-CM | POA: Diagnosis not present

## 2016-02-18 DIAGNOSIS — Z01812 Encounter for preprocedural laboratory examination: Secondary | ICD-10-CM | POA: Insufficient documentation

## 2016-02-18 LAB — BASIC METABOLIC PANEL
Anion gap: 5 (ref 5–15)
BUN: 15 mg/dL (ref 6–20)
CHLORIDE: 105 mmol/L (ref 101–111)
CO2: 26 mmol/L (ref 22–32)
Calcium: 9.3 mg/dL (ref 8.9–10.3)
Creatinine, Ser: 1.1 mg/dL (ref 0.61–1.24)
GFR calc Af Amer: >60 mL/min (ref >60)
Glucose, Bld: 71 mg/dL (ref 65–99)
POTASSIUM: 4.1 mmol/L (ref 3.5–5.1)
SODIUM: 136 mmol/L (ref 135–145)

## 2016-02-18 LAB — CBC WITH DIFFERENTIAL/PLATELET
BASOS ABS: 0 10*3/uL (ref 0–0.1)
Basophils Relative: 1 %
EOS PCT: 3 %
Eosinophils Absolute: 0.2 10*3/uL (ref 0–0.7)
HEMATOCRIT: 41.6 % (ref 40.0–52.0)
HEMOGLOBIN: 14.3 g/dL (ref 13.0–18.0)
LYMPHS ABS: 1.3 10*3/uL (ref 1.0–3.6)
LYMPHS PCT: 26 %
MCH: 31.9 pg (ref 26.0–34.0)
MCHC: 34.4 g/dL (ref 32.0–36.0)
MCV: 92.7 fL (ref 80.0–100.0)
Monocytes Absolute: 0.4 10*3/uL (ref 0.2–1.0)
Monocytes Relative: 8 %
NEUTROS ABS: 3 10*3/uL (ref 1.4–6.5)
NEUTROS PCT: 62 %
PLATELETS: 208 10*3/uL (ref 150–440)
RBC: 4.49 MIL/uL (ref 4.40–5.90)
RDW: 13.3 % (ref 11.5–14.5)
WBC: 4.9 10*3/uL (ref 3.8–10.6)

## 2016-02-18 LAB — PROTIME-INR
INR: 1.03
Prothrombin Time: 13.7 seconds (ref 11.4–15.0)

## 2016-02-19 ENCOUNTER — Telehealth: Payer: Self-pay | Admitting: Internal Medicine

## 2016-02-19 NOTE — Telephone Encounter (Signed)
Shane [name] is returning a call from [place]

## 2016-02-19 NOTE — Telephone Encounter (Signed)
PT AWARE OF LAB RESULTS./CY 

## 2016-02-25 ENCOUNTER — Encounter (HOSPITAL_COMMUNITY): Payer: Self-pay | Admitting: Internal Medicine

## 2016-02-25 ENCOUNTER — Encounter (HOSPITAL_COMMUNITY)
Admission: RE | Disposition: A | Discharge status: Home / Self Care | Payer: Self-pay | Source: Ambulatory Visit | Attending: Internal Medicine

## 2016-02-25 ENCOUNTER — Ambulatory Visit (HOSPITAL_COMMUNITY)
Admission: RE | Admit: 2016-02-25 | Discharge: 2016-02-25 | Disposition: A | Discharge status: Home / Self Care | Payer: PPO | Source: Ambulatory Visit | Attending: Internal Medicine | Admitting: Internal Medicine

## 2016-02-25 DIAGNOSIS — Z880 Allergy status to penicillin: Secondary | ICD-10-CM | POA: Diagnosis not present

## 2016-02-25 DIAGNOSIS — R55 Syncope and collapse: Secondary | ICD-10-CM | POA: Insufficient documentation

## 2016-02-25 DIAGNOSIS — Z4509 Encounter for adjustment and management of other cardiac device: Secondary | ICD-10-CM | POA: Diagnosis not present

## 2016-02-25 DIAGNOSIS — Z959 Presence of cardiac and vascular implant and graft, unspecified: Secondary | ICD-10-CM

## 2016-02-25 HISTORY — PX: EP IMPLANTABLE DEVICE: SHX172B

## 2016-02-25 SURGERY — LOOP RECORDER REMOVAL

## 2016-02-25 MED ORDER — LIDOCAINE HCL (PF) 1 % IJ SOLN
INTRAMUSCULAR | Status: DC | PRN
  Administered 2016-02-25: 40 mL

## 2016-02-25 MED ORDER — LIDOCAINE HCL (PF) 1 % IJ SOLN
INTRAMUSCULAR | Status: AC
  Filled 2016-02-25: qty 30

## 2016-02-25 MED ORDER — SODIUM CHLORIDE 0.9 % IV SOLN
INTRAVENOUS | Status: DC
  Administered 2016-02-25: 07:00:00 via INTRAVENOUS

## 2016-02-25 MED ORDER — FENTANYL CITRATE (PF) 100 MCG/2ML IJ SOLN
INTRAMUSCULAR | Status: AC
  Filled 2016-02-25: qty 2

## 2016-02-25 MED ORDER — MIDAZOLAM HCL 5 MG/5ML IJ SOLN
INTRAMUSCULAR | Status: AC
  Filled 2016-02-25: qty 5

## 2016-02-25 MED ORDER — SODIUM CHLORIDE 0.9 % IR SOLN
Status: AC
  Filled 2016-02-25: qty 2

## 2016-02-25 MED ORDER — CHLORHEXIDINE GLUCONATE 4 % EX LIQD: 60.0000 mL | Freq: Once | CUTANEOUS | Status: DC

## 2016-02-25 MED ORDER — VANCOMYCIN HCL IN DEXTROSE 1-5 GM/200ML-% IV SOLN
1000.0000 mg | INTRAVENOUS | Status: AC
  Administered 2016-02-25: 1000 mg via INTRAVENOUS
  Filled 2016-02-25: qty 200

## 2016-02-25 MED ORDER — SODIUM CHLORIDE 0.9 % IR SOLN
80.0000 mg | Status: AC
  Administered 2016-02-25: 80 mg

## 2016-02-25 SURGICAL SUPPLY — 2 items
PAD DEFIB LIFELINK (PAD) ×3 IMPLANT
TRAY PACEMAKER INSERTION (PACKS) ×3 IMPLANT

## 2016-02-25 NOTE — Discharge Instructions (Signed)
Wound Care Taking care of your wound properly can help to prevent pain and infection. It can also help your wound to heal more quickly.  HOW TO CARE FOR YOUR WOUND  Take or apply over-the-counter and prescription medicines only as told by your health care provider.  Clean the wound each day or as told by your health care provider.  Wash the wound with mild soap and water.  Rinse the wound with water to remove all soap.  Pat the wound dry with a clean towel. Do not rub it.  There are many different ways to close and cover a wound. For example, a wound can be covered with stitches (sutures), skin glue, or adhesive strips. Follow instructions from your health care provider about:  How to take care of your wound.  When and how you should change your bandage (dressing).  When you should remove your dressing.  Removing whatever was used to close your wound.  Check your wound every day for signs of infection. Watch for:  Redness, swelling, or pain.  Fluid, blood, or pus.  Keep the dressing dry until your health care provider says it can be removed. Do not take baths, swim, use a hot tub, or do anything that would put your wound underwater until your health care provider approves.  Raise (elevate) the injured area above the level of your heart while you are sitting or lying down.  Do not scratch or pick at the wound.  Keep all follow-up visits as told by your health care provider. This is important. SEEK MEDICAL CARE IF:  You received a tetanus shot and you have swelling, severe pain, redness, or bleeding at the injection site.  You have a fever.  Your pain is not controlled with medicine.  You have increased redness, swelling, or pain at the site of your wound.  You have fluid, blood, or pus coming from your wound.  You notice a bad smell coming from your wound or your dressing. SEEK IMMEDIATE MEDICAL CARE IF:  You have a red streak going away from your wound.   This  information is not intended to replace advice given to you by your health care provider. Make sure you discuss any questions you have with your health care provider.   Document Released: 08/31/2008 Document Revised: 04/08/2015 Document Reviewed: 11/18/2014 Elsevier Interactive Patient Education [employer].

## 2016-02-25 NOTE — H&P (Signed)
Patient Care Team: [name], MD as PCP - General (Family Medicine)   HPI  Shane [name] is a 65 y.o. male Here for loop recorder explant   It iis at EOL No recurrent syncope    Past Medical History  Diagnosis Date  . History of chicken pox   . Syncopal episodes 2007    x 2 s/p unrevealing hospitalization without recurrence  . Seasonal allergies   . Lipoma of neck     Past Surgical History  Procedure Laterality Date  . Internal cardiac monitor  2007    due to syncopal episodes - never contacted for f/u  . Lipoma excision N/A 02/09/2016    Procedure: EXCISION NECK LIPOMA;  Surgeon: Autumn Messing III, MD;  Location: [facility];  Service: General;  Laterality: N/A;    Current Facility-Administered Medications  Medication Dose Route Frequency Provider Last Rate Last Dose  . 0.9 %  sodium chloride infusion   Intravenous Continuous Patsey Berthold, NP 50 mL/hr at 02/25/16 0717    . chlorhexidine (HIBICLENS) 4 % liquid 4 application  60 mL Topical Once Amber Sena Slate, NP      . gentamicin (GARAMYCIN) 80 mg in sodium chloride irrigation 0.9 % 500 mL irrigation  80 mg Irrigation On Call [employer], NP      . vancomycin (VANCOCIN) IVPB 1000 mg/200 mL premix  1,000 mg Intravenous On Call Patsey Berthold, NP        Allergies  Allergen Reactions  . Percocet [Oxycodone-Acetaminophen] Other (See Comments)    TURNS RED FROM CHEST UP, LOOKS LIKE SUNBURN AND STARTS TO [place] BADLY  . Penicillins Swelling and Rash  . Zithromax [Azithromycin] Rash     Medication List    Notice    You have not been prescribed any medications.       Social History   Social History  . Marital Status: Married    Spouse Name: N/A  . Number of Children: N/A  . Years of Education: N/A   Occupational History  . Not on file.   Social History Main Topics  . Smoking status: Never Smoker   . Smokeless tobacco: Never Used  . Alcohol Use: 0.0 oz/week    0 Standard drinks or  equivalent per week     Comment: 1 beer most evenings  . Drug Use: No  . Sexual Activity: Not on file   Other Topics Concern  . Not on file   Social History Narrative   Lives with wife   Grown children   Occ: retired Engineer, structural, Musician   Edu: Bachelor of Art   Activity: stays active outdoors working   Diet: some water, fruits/vegetables daily   Family History  Problem Relation Age of Onset  . Hyperlipidemia Mother   . Cancer Mother     peritoneal cancer  . Hemochromatosis Paternal Uncle   . Hemochromatosis Father     s/p liver transplant - pt screened negative 07/2015  . Hemochromatosis Paternal Grandmother   . Stroke Neg Hx   . Diabetes Neg Hx   . CAD Neg Hx     Review of Systems negative except from HPI and PMH  Physical Exam BP 130/84 mmHg  Pulse 48  Temp(Src) 97.9 F (36.6 C) (Oral)  Resp 18  Ht 6' (1.829 m)  Wt 185 lb (83.915 kg)  BMI 25.08 kg/m2  SpO2 98% Well developed and well nourished in no acute distress  HENT normal E scleral and icterus clear Neck Supple   Clear to ausculation  Regular rate and rhythm, no murmurs gallops or rub Soft with active bowel sounds No clubbing cyanosis  } Edema Alert and oriented, grossly normal motor and sensory function Skin Warm and Dry    Assessment and  Plan  synoocpe   Implantable loop recorder   For explant today  procdedure reviewed

## 2016-02-26 MED FILL — Gentamicin Sulfate Inj 40 MG/ML: INTRAMUSCULAR | Qty: 2 | Status: AC

## 2016-02-26 MED FILL — Sodium Chloride Irrigation Soln 0.9%: Qty: 500 | Status: AC

## 2016-03-08 ENCOUNTER — Ambulatory Visit (INDEPENDENT_AMBULATORY_CARE_PROVIDER_SITE_OTHER): Payer: PPO | Admitting: *Deleted

## 2016-03-08 DIAGNOSIS — Z9889 Other specified postprocedural states: Secondary | ICD-10-CM

## 2016-03-08 NOTE — Progress Notes (Signed)
Wound check in clinic status post ILR removal on 02-25-2016. Dermabond removed. Wound healing. Pt notes small amount of drainage first thing in am, unable to reproduce in office. Site soft to palpation with exception to small firm area above left lateral incision site.  Pt denies fever, chills worsening swelling or pain. Pt agreeable to wound re-check at [facility] office in 3 weeks. Is aware to wash site daily and call with worsening symptoms or signs of infection in interim.

## 2016-03-30 ENCOUNTER — Ambulatory Visit (INDEPENDENT_AMBULATORY_CARE_PROVIDER_SITE_OTHER): Payer: PPO | Admitting: *Deleted

## 2016-03-30 DIAGNOSIS — Z9889 Other specified postprocedural states: Secondary | ICD-10-CM

## 2016-03-30 NOTE — Progress Notes (Signed)
Wound recheck in clinic, s/p ILR explant on 02/25/16.  Incision edges approximated, wound well healed.  Patient denies drainage, tenderness, or swelling at site.  Patient aware to call with questions or concerns.  ROV with SK PRN.

## 2016-07-29 ENCOUNTER — Other Ambulatory Visit: Payer: Self-pay | Admitting: Family Medicine

## 2016-07-29 DIAGNOSIS — E785 Hyperlipidemia, unspecified: Secondary | ICD-10-CM

## 2016-07-29 DIAGNOSIS — Z1159 Encounter for screening for other viral diseases: Secondary | ICD-10-CM

## 2016-07-30 ENCOUNTER — Other Ambulatory Visit (INDEPENDENT_AMBULATORY_CARE_PROVIDER_SITE_OTHER): Payer: PPO

## 2016-07-30 DIAGNOSIS — Z1159 Encounter for screening for other viral diseases: Secondary | ICD-10-CM

## 2016-07-30 DIAGNOSIS — E785 Hyperlipidemia, unspecified: Secondary | ICD-10-CM

## 2016-07-30 LAB — BASIC METABOLIC PANEL
BUN: 15 mg/dL (ref 6–23)
CHLORIDE: 105 meq/L (ref 96–112)
CO2: 28 meq/L (ref 19–32)
Calcium: 9.4 mg/dL (ref 8.4–10.5)
Creatinine, Ser: 1.14 mg/dL (ref 0.40–1.50)
GFR: 68.31 mL/min (ref >60.00)
Glucose, Bld: 99 mg/dL (ref 70–99)
POTASSIUM: 4.8 meq/L (ref 3.5–5.1)
SODIUM: 138 meq/L (ref 135–145)

## 2016-07-30 LAB — LIPID PANEL
CHOL/HDL RATIO: 5
Cholesterol: 193 mg/dL (ref 0–200)
HDL: 41.6 mg/dL (ref >39.00)
LDL Cholesterol: 121 mg/dL — ABNORMAL HIGH (ref 0–99)
NONHDL: 151.08
Triglycerides: 150 mg/dL — ABNORMAL HIGH (ref 0.0–149.0)
VLDL: 30 mg/dL (ref 0.0–40.0)

## 2016-07-31 LAB — HEPATITIS C ANTIBODY: HCV Ab: NEGATIVE

## 2016-08-03 ENCOUNTER — Encounter: Payer: Self-pay | Admitting: Family Medicine

## 2016-08-03 ENCOUNTER — Ambulatory Visit (INDEPENDENT_AMBULATORY_CARE_PROVIDER_SITE_OTHER): Payer: PPO | Admitting: Family Medicine

## 2016-08-03 VITALS — BP 136/82 | HR 60 | Temp 98.3°F | Ht 71.0 in | Wt 190.8 lb

## 2016-08-03 DIAGNOSIS — Z8349 Family history of other endocrine, nutritional and metabolic diseases: Secondary | ICD-10-CM | POA: Insufficient documentation

## 2016-08-03 DIAGNOSIS — Z23 Encounter for immunization: Secondary | ICD-10-CM

## 2016-08-03 DIAGNOSIS — Z0000 Encounter for general adult medical examination without abnormal findings: Secondary | ICD-10-CM | POA: Diagnosis not present

## 2016-08-03 DIAGNOSIS — Z959 Presence of cardiac and vascular implant and graft, unspecified: Secondary | ICD-10-CM

## 2016-08-03 DIAGNOSIS — Z7189 Other specified counseling: Secondary | ICD-10-CM

## 2016-08-03 NOTE — Assessment & Plan Note (Signed)
Advanced planning - does not have this set up, would like packet of information. Planning on setting up, to see lawyer. Wife would be HCPOA.  

## 2016-08-03 NOTE — Assessment & Plan Note (Signed)

## 2016-08-03 NOTE — Patient Instructions (Addendum)
Prevnar today.  We will sign you up for cologuard.  Let us know when you get tetanus/pertussis (try Tdap) to update your chart.  Work on Scientist, physiological.  You are doing well today.  Return as needed or in 1 year for next medicare wellness visit.   Health Maintenance, Male A healthy lifestyle and preventative care can promote health and wellness.  Maintain regular health, dental, and eye exams.  Eat a healthy diet. Foods like vegetables, fruits, whole grains, low-fat dairy products, and lean protein foods contain the nutrients you need and are low in calories. Decrease your intake of foods high in solid fats, added sugars, and salt. Get information about a proper diet from your health care provider, if necessary.  Regular physical exercise is one of the most important things you can do for your health. Most adults should get at least 150 minutes of moderate-intensity exercise (any activity that increases your heart rate and causes you to sweat) each week. In addition, most adults need muscle-strengthening exercises on 2 or more days a week.   Maintain a healthy weight. The body mass index (BMI) is a screening tool to identify possible weight problems. It provides an estimate of body fat based on height and weight. Your health care provider can find your BMI and can help you achieve or maintain a healthy weight. For males 20 years and older:  A BMI below 18.5 is considered underweight.  A BMI of 18.5 to 24.9 is normal.  A BMI of 25 to 29.9 is considered overweight.  A BMI of 30 and above is considered obese.  Maintain normal blood lipids and cholesterol by exercising and minimizing your intake of saturated fat. Eat a balanced diet with plenty of fruits and vegetables. Blood tests for lipids and cholesterol should begin at age 74 and be repeated every 5 years. If your lipid or cholesterol levels are high, you are over age 82, or you are at high risk for heart disease, you may need your  cholesterol levels checked more frequently.Ongoing high lipid and cholesterol levels should be treated with medicines if diet and exercise are not working.  If you smoke, find out from your health care provider how to quit. If you do not use tobacco, do not start.  Lung cancer screening is recommended for adults aged 20-80 years who are at high risk for developing lung cancer because of a history of smoking. A yearly low-dose CT scan of the lungs is recommended for people who have at least a 30-pack-year history of smoking and are current smokers or have quit within the past 15 years. A pack year of smoking is smoking an average of 1 pack of cigarettes a day for 1 year (for example, a 30-pack-year history of smoking could mean smoking 1 pack a day for 30 years or 2 packs a day for 15 years). Yearly screening should continue until the smoker has stopped smoking for at least 15 years. Yearly screening should be stopped for people who develop a health problem that would prevent them from having lung cancer treatment.  If you choose to drink alcohol, do not have more than 2 drinks per day. One drink is considered to be 12 oz (360 mL) of beer, 5 oz (150 mL) of wine, or 1.5 oz (45 mL) of liquor.  Avoid the use of street drugs. Do not share needles with anyone. Ask for help if you need support or instructions about stopping the use of drugs.  High  blood pressure causes heart disease and increases the risk of stroke. High blood pressure is more likely to develop in:  People who have blood pressure in the end of the normal range (100-139/85-89 mm Hg).  People who are overweight or obese.  People who are African American.  If you are 42-30 years of age, have your blood pressure checked every 3-5 years. If you are 45 years of age or older, have your blood pressure checked every year. You should have your blood pressure measured twice--once when you are at a hospital or clinic, and once when you are not at a  hospital or clinic. Record the average of the two measurements. To check your blood pressure when you are not at a hospital or clinic, you can use:  An automated blood pressure machine at a pharmacy.  A home blood pressure monitor.  If you are 44-79 years old, ask your health care provider if you should take aspirin to prevent heart disease.  Diabetes screening involves taking a blood sample to check your fasting blood sugar level. This should be done once every 3 years after age 23 if you are at a normal weight and without risk factors for diabetes. Testing should be considered at a younger age or be carried out more frequently if you are overweight and have at least 1 risk factor for diabetes.  Colorectal cancer can be detected and often prevented. Most routine colorectal cancer screening begins at the age of 41 and continues through age 59. However, your health care provider may recommend screening at an earlier age if you have risk factors for colon cancer. On a yearly basis, your health care provider may provide home test kits to check for hidden blood in the stool. A small camera at the end of a tube may be used to directly examine the colon (sigmoidoscopy or colonoscopy) to detect the earliest forms of colorectal cancer. Talk to your health care provider about this at age 9 when routine screening begins. A direct exam of the colon should be repeated every 5-10 years through age 75, unless early forms of precancerous polyps or small growths are found.  People who are at an increased risk for hepatitis B should be screened for this virus. You are considered at high risk for hepatitis B if:  You were born in a country where hepatitis B occurs often. Talk with your health care provider about which countries are considered high risk.  Your parents were born in a high-risk country and you have not received a shot to protect against hepatitis B (hepatitis B vaccine).  You have HIV or AIDS.  You  use needles to inject street drugs.  You live with, or have sex with, someone who has hepatitis B.  You are a man who has sex with other men (MSM).  You get hemodialysis treatment.  You take certain medicines for conditions like cancer, organ transplantation, and autoimmune conditions.  Hepatitis C blood testing is recommended for all people born from [birth year] through 1966 and any individual with known risk factors for hepatitis C.  Healthy men should no longer receive prostate-specific antigen (PSA) blood tests as part of routine cancer screening. Talk to your health care provider about prostate cancer screening.  Testicular cancer screening is not recommended for adolescents or adult males who have no symptoms. Screening includes self-exam, a health care provider exam, and other screening tests. Consult with your health care provider about any symptoms you have or any concerns  you have about testicular cancer.  Practice safe sex. Use condoms and avoid high-risk sexual practices to reduce the spread of sexually transmitted infections (STIs).  You should be screened for STIs, including gonorrhea and chlamydia if:  You are sexually active and are younger than 24 years.  You are older than 24 years, and your health care provider tells you that you are at risk for this type of infection.  Your sexual activity has changed since you were last screened, and you are at an increased risk for chlamydia or gonorrhea. Ask your health care provider if you are at risk.  If you are at risk of being infected with HIV, it is recommended that you take a prescription medicine daily to prevent HIV infection. This is called pre-exposure prophylaxis (PrEP). You are considered at risk if:  You are a man who has sex with other men (MSM).  You are a heterosexual man who is sexually active with multiple partners.  You take drugs by injection.  You are sexually active with a partner who has HIV.  Talk with  your health care provider about whether you are at high risk of being infected with HIV. If you choose to begin PrEP, you should first be tested for HIV. You should then be tested every 3 months for as long as you are taking PrEP.  Use sunscreen. Apply sunscreen liberally and repeatedly throughout the day. You should seek shade when your shadow is shorter than you. Protect yourself by wearing long sleeves, pants, a wide-brimmed hat, and sunglasses year round whenever you are outdoors.  Tell your health care provider of new moles or changes in moles, especially if there is a change in shape or color. Also, tell your health care provider if a mole is larger than the size of a pencil eraser.  A one-time screening for abdominal aortic aneurysm (AAA) and surgical repair of large AAAs by ultrasound is recommended for men aged 46-75 years who are current or former smokers.  Stay current with your vaccines (immunizations).   This information is not intended to replace advice given to you by your health care provider. Make sure you discuss any questions you have with your health care provider.   Document Released: 05/20/2008 Document Revised: 12/13/2014 Document Reviewed: 04/19/2011 Elsevier Interactive Patient Education [employer].

## 2016-08-03 NOTE — Addendum Note (Signed)
Addended by: Royann Shivers A on: 08/03/2016 10:45 AM   Modules accepted: Orders

## 2016-08-03 NOTE — Progress Notes (Signed)
Pre visit review using our clinic review tool, if applicable. No additional management support is needed unless otherwise documented below in the visit note. 

## 2016-08-03 NOTE — Assessment & Plan Note (Signed)
Removed earlier this year. Wound healed well.

## 2016-08-03 NOTE — Assessment & Plan Note (Signed)
LFTs, iron panel 07/2015 WNL.

## 2016-08-03 NOTE — Progress Notes (Signed)
BP 136/82   Pulse 60   Temp 98.3 F (36.8 C) (Oral)   Ht 5\' 11"  (1.803 m)   Wt 190 lb 12 oz (86.5 kg)   BMI 26.60 kg/m    CC: medicare wellness visit Subjective:    Patient ID: Shane [name], male    DOB: 06-03-1951, 65 y.o.   MRN: [id number]  HPI: Shane [name] is a 65 y.o. 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   Has healed well from lipoma and event monitor removals earlier this year.   Strong fmhx hemochromatosis.  Lab Results  Component Value Date   ALT 13 07/30/2015   AST 20 07/30/2015   ALKPHOS 42 07/30/2015   BILITOT 0.8 07/30/2015    Passes hearing and vision screens today. Denies falls, depression, anhedonia, sadness.  Preventative: Colonoscopy remotely normal. Requests iFOB.  Prostate screening - discussed. Declines screening for now.  Flu shot - declines  Prevnar - today Tdap - will get at pharmacy zostavax - declines  Advanced planning - does not have this set up, would like packet of information. Planning on setting up, to see lawyer. Wife would be HCPOA.  Seat belt use discussed  Sunscreen use discussed, no changing moles on skin. Not a smoker. Alcohol - 1 beer/day  Lives with wife Grown children Occ: retired Engineer, structural, Musician Edu: [employer] Activity: stays active outdoors working  Diet: some water, fruits/vegetables daily   Relevant past medical, surgical, family and social history reviewed and updated as indicated. Interim medical history since our last visit reviewed. Allergies and medications reviewed and updated. No current outpatient prescriptions on file prior to visit.   No current facility-administered medications on file prior to visit.     Review of Systems Per HPI unless specifically indicated in ROS section     Objective:    BP 136/82   Pulse 60   Temp 98.3 F (36.8 C) (Oral)   Ht 5\' 11"  (1.803 m)   Wt 190 lb 12 oz  (86.5 kg)   BMI 26.60 kg/m   Wt Readings from Last 3 Encounters:  08/03/16 190 lb 12 oz (86.5 kg)  02/25/16 185 lb (83.9 kg)  02/09/16 189 lb (85.7 kg)    Physical Exam  Constitutional: He is oriented to person, place, and time. He appears well-developed and well-nourished. No distress.  HENT:  Head: Normocephalic and atraumatic.  Right Ear: Hearing, tympanic membrane, external ear and ear canal normal.  Left Ear: Hearing, tympanic membrane, external ear and ear canal normal.  Nose: Nose normal.  Mouth/Throat: Uvula is midline, oropharynx is clear and moist and mucous membranes are normal. No oropharyngeal exudate, posterior oropharyngeal edema or posterior oropharyngeal erythema.  Eyes: Conjunctivae and EOM are normal. Pupils are equal, round, and reactive to light. No scleral icterus.  Neck: Normal range of motion. Neck supple. Carotid bruit is not present. No thyromegaly present.  Cardiovascular: Normal rate, regular rhythm, normal heart sounds and intact distal pulses.   No murmur heard. Pulses:      Radial pulses are 2+ on the right side, and 2+ on the left side.  Pulmonary/Chest: Effort normal and breath sounds normal. No respiratory distress. He has no wheezes. He has no rales.  Abdominal: Soft. Bowel sounds are normal. He exhibits no distension and no mass. There is no tenderness. There is no rebound and no guarding.  Musculoskeletal: Normal range of motion. He  exhibits no edema.  Lymphadenopathy:    He has no cervical adenopathy.  Neurological: He is alert and oriented to person, place, and time.  CN grossly intact, station and gait intact Recall 3/3 Calculation 5/5 serial 7s  Skin: Skin is warm and dry. No rash noted.  Psychiatric: He has a normal mood and affect. His behavior is normal. Judgment and thought content normal.  Nursing note and vitals reviewed.  Results for orders placed or performed in visit on 07/30/16  Lipid panel  Result Value Ref Range   Cholesterol  193 0 - 200 mg/dL   Triglycerides 150.0 (H) 0.0 - 149.0 mg/dL   HDL 41.60 >39.00 mg/dL   VLDL 30.0 0.0 - 40.0 mg/dL   LDL Cholesterol 121 (H) 0 - 99 mg/dL   Total CHOL/HDL Ratio 5    NonHDL 123456   Basic metabolic panel  Result Value Ref Range   Sodium 138 135 - 145 mEq/L   Potassium 4.8 3.5 - 5.1 mEq/L   Chloride 105 96 - 112 mEq/L   CO2 28 19 - 32 mEq/L   Glucose, Bld 99 70 - 99 mg/dL   BUN 15 6 - 23 mg/dL   Creatinine, Ser 1.14 0.40 - 1.50 mg/dL   Calcium 9.4 8.4 - 10.5 mg/dL   GFR 68.31 >60.00 mL/min  Hepatitis C antibody  Result Value Ref Range   HCV Ab NEGATIVE NEGATIVE      Assessment & Plan:  No fmhx CAD/CVA.  ASCVD 10 yr risk = 16.4% - will discuss next visit.  Problem List Items Addressed This Visit    Advanced care planning/counseling discussion    Advanced planning - does not have this set up, would like packet of information. Planning on setting up, to see lawyer. Wife would be HCPOA.       RESOLVED: Cardiac device in situ    Removed earlier this year. Wound healed well.      Family history of hemochromatosis    LFTs, iron panel 07/2015 WNL.       Medicare annual wellness visit, initial - Primary    I have personally reviewed the Medicare Annual Wellness questionnaire and have noted 1. The patient's medical and social history 2. Their use of alcohol, tobacco or illicit drugs 3. Their current medications and supplements 4. The patient's functional ability including ADL's, fall risks, home safety risks and hearing or visual impairment. Cognitive function has been assessed and addressed as indicated.  5. Diet and physical activity 6. Evidence for depression or mood disorders The patients weight, height, BMI have been recorded in the chart. I have made referrals, counseling and provided education to the patient based on review of the above and I have provided the pt with a written personalized care plan for preventive services. Provider list updated.. See  scanned questionairre as needed for further documentation. Reviewed preventative protocols and updated unless pt declined.        Other Visit Diagnoses   None.      Follow up plan: Return in about 1 year (around 08/03/2017), or as needed, for medicare wellness visit.  [name], MD

## 2016-08-18 ENCOUNTER — Encounter: Payer: Self-pay | Admitting: Family Medicine

## 2016-08-18 DIAGNOSIS — Z1212 Encounter for screening for malignant neoplasm of rectum: Secondary | ICD-10-CM | POA: Diagnosis not present

## 2016-08-18 DIAGNOSIS — Z1211 Encounter for screening for malignant neoplasm of colon: Secondary | ICD-10-CM | POA: Diagnosis not present

## 2016-08-18 LAB — COLOGUARD

## 2016-08-26 DIAGNOSIS — H25813 Combined forms of age-related cataract, bilateral: Secondary | ICD-10-CM | POA: Diagnosis not present

## 2017-02-02 ENCOUNTER — Encounter (INDEPENDENT_AMBULATORY_CARE_PROVIDER_SITE_OTHER): Payer: Self-pay

## 2017-02-02 ENCOUNTER — Ambulatory Visit (INDEPENDENT_AMBULATORY_CARE_PROVIDER_SITE_OTHER): Payer: PPO | Admitting: Family Medicine

## 2017-02-02 ENCOUNTER — Encounter: Payer: Self-pay | Admitting: Family Medicine

## 2017-02-02 VITALS — BP 120/68 | HR 60 | Temp 98.4°F | Wt 192.0 lb

## 2017-02-02 DIAGNOSIS — S61212A Laceration without foreign body of right middle finger without damage to nail, initial encounter: Secondary | ICD-10-CM | POA: Diagnosis not present

## 2017-02-02 DIAGNOSIS — Z23 Encounter for immunization: Secondary | ICD-10-CM | POA: Diagnosis not present

## 2017-02-02 MED ORDER — DOXYCYCLINE HYCLATE 100 MG PO TABS: 100.0000 mg | ORAL_TABLET | Freq: Two times a day (BID) | ORAL | 0 refills | Status: DC

## 2017-02-02 NOTE — Patient Instructions (Signed)
Tetanus shot today.   Start the antibiotics if more redness or pain and update [place].  You can do gradual shoulder pulls and shoulder shrugs and arm extensions to help with hunting.  Take care.  Glad to see you.

## 2017-02-02 NOTE — Progress Notes (Signed)
Pre visit review using our clinic review tool, if applicable. No additional management support is needed unless otherwise documented below in the visit note. 

## 2017-02-02 NOTE — Progress Notes (Signed)
R hand injury 2 days ago  He was digging and hit his hand on old chicken wire.  He thought he got everything out.  The swelling and redness is better in the meantime. No FCNAVD.  Not draining pus.  Not ttp at all now.  Has been working all day and not sore with movement.    He pulled a 35 lbs-draw bow recently and had L shoulder creaking/noise and R elbow pain.  No pain now.   Meds, vitals, and allergies reviewed.   ROS: Per HPI unless specifically indicated in ROS section   nad Palmar side side of mid phalanx in R 3rd finger with local irritation and small nick in the skin but no fluctuant mass. No foreign body seen on magnification or transillumination. Normal range of motion. Normal capillary refill. Normal range of motion on right elbow. Not tender to palpation. No weakness. Left shoulder with normal range of motion. Normal internal and external rotation. No arm drop. No crepitus.

## 2017-02-03 DIAGNOSIS — S61212A Laceration without foreign body of right middle finger without damage to nail, initial encounter: Secondary | ICD-10-CM | POA: Insufficient documentation

## 2017-02-03 NOTE — Assessment & Plan Note (Signed)
The area looks clean. He thought he gotten all the foreign body out. I will see any retained foreign body. Discussed with patient. Hold doxycycline for now. If worsening redness or pain or swelling and start doxycycline and update me. No need to x-ray today. Tetanus shot done today.  It is an incidental issue about his left shoulder and right elbow with pulling back a compound bow.  Unremarkable exam today. Discussed with patient about routine shoulder exercises. It had been about 10 years since he tried to pull that bow back, so he likely does need some strengthening exercises. Discussed with patient. He agrees. Update [place] as needed.

## 2017-07-31 ENCOUNTER — Other Ambulatory Visit: Payer: Self-pay | Admitting: Family Medicine

## 2017-07-31 DIAGNOSIS — E785 Hyperlipidemia, unspecified: Secondary | ICD-10-CM

## 2017-07-31 DIAGNOSIS — Z8349 Family history of other endocrine, nutritional and metabolic diseases: Secondary | ICD-10-CM

## 2017-08-03 ENCOUNTER — Other Ambulatory Visit (INDEPENDENT_AMBULATORY_CARE_PROVIDER_SITE_OTHER): Payer: PPO

## 2017-08-03 DIAGNOSIS — E785 Hyperlipidemia, unspecified: Secondary | ICD-10-CM | POA: Diagnosis not present

## 2017-08-03 DIAGNOSIS — Z8349 Family history of other endocrine, nutritional and metabolic diseases: Secondary | ICD-10-CM | POA: Diagnosis not present

## 2017-08-03 LAB — HEPATIC FUNCTION PANEL
ALK PHOS: 37 U/L — AB (ref 39–117)
ALT: 18 U/L (ref 0–53)
AST: 20 U/L (ref 0–37)
Albumin: 4.3 g/dL (ref 3.5–5.2)
BILIRUBIN DIRECT: 0.2 mg/dL (ref 0.0–0.3)
Total Bilirubin: 0.5 mg/dL (ref 0.2–1.2)
Total Protein: 7 g/dL (ref 6.0–8.3)

## 2017-08-03 LAB — LIPID PANEL
CHOL/HDL RATIO: 4
Cholesterol: 180 mg/dL (ref 0–200)
HDL: 41.7 mg/dL (ref >39.00)
LDL CALC: 115 mg/dL — AB (ref 0–99)
NONHDL: 137.99
TRIGLYCERIDES: 114 mg/dL (ref 0.0–149.0)
VLDL: 22.8 mg/dL (ref 0.0–40.0)

## 2017-08-03 LAB — BASIC METABOLIC PANEL
BUN: 19 mg/dL (ref 6–23)
CALCIUM: 9.7 mg/dL (ref 8.4–10.5)
CHLORIDE: 106 meq/L (ref 96–112)
CO2: 28 meq/L (ref 19–32)
CREATININE: 1.16 mg/dL (ref 0.40–1.50)
GFR: 66.75 mL/min (ref >60.00)
GLUCOSE: 98 mg/dL (ref 70–99)
Potassium: 4.4 mEq/L (ref 3.5–5.1)
Sodium: 139 mEq/L (ref 135–145)

## 2017-08-05 ENCOUNTER — Ambulatory Visit (INDEPENDENT_AMBULATORY_CARE_PROVIDER_SITE_OTHER): Payer: PPO | Admitting: Family Medicine

## 2017-08-05 ENCOUNTER — Encounter (INDEPENDENT_AMBULATORY_CARE_PROVIDER_SITE_OTHER): Payer: Self-pay

## 2017-08-05 ENCOUNTER — Encounter: Payer: Self-pay | Admitting: Family Medicine

## 2017-08-05 VITALS — BP 124/70 | HR 61 | Temp 97.7°F | Ht 71.0 in | Wt 187.0 lb

## 2017-08-05 DIAGNOSIS — Z0000 Encounter for general adult medical examination without abnormal findings: Secondary | ICD-10-CM | POA: Diagnosis not present

## 2017-08-05 DIAGNOSIS — Z7189 Other specified counseling: Secondary | ICD-10-CM

## 2017-08-05 DIAGNOSIS — Z23 Encounter for immunization: Secondary | ICD-10-CM | POA: Diagnosis not present

## 2017-08-05 DIAGNOSIS — R430 Anosmia: Secondary | ICD-10-CM | POA: Insufficient documentation

## 2017-08-05 DIAGNOSIS — Z8349 Family history of other endocrine, nutritional and metabolic diseases: Secondary | ICD-10-CM

## 2017-08-05 NOTE — Addendum Note (Signed)
Addended by: Modena Nunnery on: 08/05/2017 09:13 AM   Modules accepted: Orders

## 2017-08-05 NOTE — Assessment & Plan Note (Signed)
Preventative protocols reviewed and updated unless pt declined. Discussed healthy diet and lifestyle.  

## 2017-08-05 NOTE — Patient Instructions (Addendum)
We will contact cologuard to get copy of normal report.  Pneumovax today Continue healthy diet and stay active you are doing well today. Return as needed or in 1 year for next wellness visit.   Health Maintenance, Male A healthy lifestyle and preventive care is important for your health and wellness. Ask your health care provider about what schedule of regular examinations is right for you. What should I know about weight and diet? Eat a Healthy Diet  Eat plenty of vegetables, fruits, whole grains, low-fat dairy products, and lean protein.  Do not eat a lot of foods high in solid fats, added sugars, or salt.  Maintain a Healthy Weight Regular exercise can help you achieve or maintain a healthy weight. You should:  Do at least 150 minutes of exercise each week. The exercise should increase your heart rate and make you sweat (moderate-intensity exercise).  Do strength-training exercises at least twice a week.  Watch Your Levels of Cholesterol and Blood Lipids  Have your blood tested for lipids and cholesterol every 5 years starting at 67 years of age. If you are at high risk for heart disease, you should start having your blood tested when you are 67 years old. You may need to have your cholesterol levels checked more often if: ? Your lipid or cholesterol levels are high. ? You are older than 68 years of age. ? You are at high risk for heart disease.  What should I know about cancer screening? Many types of cancers can be detected early and may often be prevented. Lung Cancer  You should be screened every year for lung cancer if: ? You are a current smoker who has smoked for at least 30 years. ? You are a former smoker who has quit within the past 15 years.  Talk to your health care provider about your screening options, when you should start screening, and how often you should be screened.  Colorectal Cancer  Routine colorectal cancer screening usually begins at 67 years of age  and should be repeated every 5-10 years until you are 67 years old. You may need to be screened more often if early forms of precancerous polyps or small growths are found. Your health care provider may recommend screening at an earlier age if you have risk factors for colon cancer.  Your health care provider may recommend using home test kits to check for hidden blood in the stool.  A small camera at the end of a tube can be used to examine your colon (sigmoidoscopy or colonoscopy). This checks for the earliest forms of colorectal cancer.  Prostate and Testicular Cancer  Depending on your age and overall health, your health care provider may do certain tests to screen for prostate and testicular cancer.  Talk to your health care provider about any symptoms or concerns you have about testicular or prostate cancer.  Skin Cancer  Check your skin from head to toe regularly.  Tell your health care provider about any new moles or changes in moles, especially if: ? There is a change in a mole's size, shape, or color. ? You have a mole that is larger than a pencil eraser.  Always use sunscreen. Apply sunscreen liberally and repeat throughout the day.  Protect yourself by wearing long sleeves, pants, a wide-brimmed hat, and sunglasses when outside.  What should I know about heart disease, diabetes, and high blood pressure?  If you are 52-105 years of age, have your blood pressure checked every  3-5 years. If you are 75 years of age or older, have your blood pressure checked every year. You should have your blood pressure measured twice-once when you are at a hospital or clinic, and once when you are not at a hospital or clinic. Record the average of the two measurements. To check your blood pressure when you are not at a hospital or clinic, you can use: ? An automated blood pressure machine at a pharmacy. ? A home blood pressure monitor.  Talk to your health care provider about your target blood  pressure.  If you are between 75-71 years old, ask your health care provider if you should take aspirin to prevent heart disease.  Have regular diabetes screenings by checking your fasting blood sugar level. ? If you are at a normal weight and have a low risk for diabetes, have this test once every three years after the age of 77. ? If you are overweight and have a high risk for diabetes, consider being tested at a younger age or more often.  A one-time screening for abdominal aortic aneurysm (AAA) by ultrasound is recommended for men aged 25-75 years who are current or former smokers. What should I know about preventing infection? Hepatitis B If you have a higher risk for hepatitis B, you should be screened for this virus. Talk with your health care provider to find out if you are at risk for hepatitis B infection. Hepatitis C Blood testing is recommended for:  Everyone born from [birth year] through 1965.  Anyone with known risk factors for hepatitis C.  Sexually Transmitted Diseases (STDs)  You should be screened each year for STDs including gonorrhea and chlamydia if: ? You are sexually active and are younger than 67 years of age. ? You are older than 67 years of age and your health care provider tells you that you are at risk for this type of infection. ? Your sexual activity has changed since you were last screened and you are at an increased risk for chlamydia or gonorrhea. Ask your health care provider if you are at risk.  Talk with your health care provider about whether you are at high risk of being infected with HIV. Your health care provider may recommend a prescription medicine to help prevent HIV infection.  What else can I do?  Schedule regular health, dental, and eye exams.  Stay current with your vaccines (immunizations).  Do not use any tobacco products, such as cigarettes, chewing tobacco, and e-cigarettes. If you need help quitting, ask your health care  provider.  Limit alcohol intake to no more than 2 drinks per day. One drink equals 12 ounces of beer, 5 ounces of wine, or 1 ounces of hard liquor.  Do not use street drugs.  Do not share needles.  Ask your health care provider for help if you need support or information about quitting drugs.  Tell your health care provider if you often feel depressed.  Tell your health care provider if you have ever been abused or do not feel safe at home. This information is not intended to replace advice given to you by your health care provider. Make sure you discuss any questions you have with your health care provider. Document Released: 05/20/2008 Document Revised: 07/21/2016 Document Reviewed: 08/26/2015 Elsevier Interactive Patient Education  [employer].

## 2017-08-05 NOTE — Assessment & Plan Note (Signed)
Advanced planning - does not have this set up. Planning on setting up, to see lawyer. Wife would be HCPOA.  

## 2017-08-05 NOTE — Assessment & Plan Note (Signed)

## 2017-08-05 NOTE — Assessment & Plan Note (Addendum)
Benign exam today. Progressive. Will continue to monitor.  No dry mouth/eyes, memory concerns.

## 2017-08-05 NOTE — Progress Notes (Signed)
BP 124/70   Pulse 61   Temp 97.7 F (36.5 C) (Oral)   Ht 5\' 11"  (1.803 m)   Wt 187 lb (84.8 kg)   SpO2 98%   BMI 26.08 kg/m    CC: CPE Subjective:    Patient ID: Shane [name], male    DOB: Apr 24, 1951, 66 y.o.   MRN: [id number]  HPI: Shane [name] is a 66 y.o. 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  Anosmia - to certain smells. Tastes well. Progressive. Denies significant allergic rhinitis. Denies dry mouth or dry eyes.  Strong fmhx hemochromatosis.   Passes hearing and vision screens today. Denies falls, depression, anhedonia, sadness.  Preventative: Colonoscopy remotely normal. States cologuard normal last year - we never received records. Prostate screening - discussed. Declines screening for now.  Flu shot - declines  Prevnar 2017, pneumovax today Tdap 01/2017 zostavax - declines  shingrix - discussed  Advanced planning - does not have this set up. Planning on setting up, to see lawyer. Wife would be HCPOA.  Seat belt use discussed  Sunscreen use discussed, no changing moles on skin. Not a smoker. Alcohol - 1 beer/day  Lives with wife Grown children Occ: retired Engineer, structural, Musician Edu: Bachelor of Art Activity: stays active outdoors working  Diet: some water, fruits/vegetables daily   Relevant past medical, surgical, family and social history reviewed and updated as indicated. Interim medical history since our last visit reviewed. Allergies and medications reviewed and updated. Outpatient Medications Prior to Visit  Medication Sig Dispense Refill  . doxycycline (VIBRA-TABS) 100 MG tablet Take 1 tablet (100 mg total) by mouth 2 (two) times daily. 20 tablet 0   No facility-administered medications prior to visit.      Per HPI unless specifically indicated in ROS section below Review of Systems  Constitutional: Negative  for activity change, appetite change, chills, fatigue, fever and unexpected weight change.  HENT: Negative for hearing loss.   Eyes: Negative for visual disturbance.  Respiratory: Negative for cough, chest tightness, shortness of breath and wheezing.   Cardiovascular: Negative for chest pain, palpitations and leg swelling.  Gastrointestinal: Positive for diarrhea (intermittent). Negative for abdominal distention, abdominal pain, blood in stool, constipation, nausea and vomiting.  Genitourinary: Negative for difficulty urinating and hematuria.  Musculoskeletal: Negative for arthralgias, myalgias and neck pain.  Skin: Negative for rash.  Neurological: Negative for dizziness, seizures, syncope and headaches.  Hematological: Negative for adenopathy. Does not bruise/bleed easily.  Psychiatric/Behavioral: Negative for dysphoric mood. The patient is not nervous/anxious.        Objective:    BP 124/70   Pulse 61   Temp 97.7 F (36.5 C) (Oral)   Ht 5\' 11"  (1.803 m)   Wt 187 lb (84.8 kg)   SpO2 98%   BMI 26.08 kg/m   Wt Readings from Last 3 Encounters:  08/05/17 187 lb (84.8 kg)  02/02/17 192 lb (87.1 kg)  08/03/16 190 lb 12 oz (86.5 kg)    Physical Exam  Constitutional: He is oriented to person, place, and time. He appears well-developed and well-nourished. No distress.  HENT:  Head: Normocephalic and atraumatic.  Right Ear: Hearing, tympanic membrane, external ear and ear canal normal.  Left Ear: Hearing, tympanic membrane, external ear and ear canal normal.  Nose: Nose normal. No mucosal edema or rhinorrhea.  Mouth/Throat: Uvula is midline, oropharynx is clear  and moist and mucous membranes are normal. No oropharyngeal exudate, posterior oropharyngeal edema or posterior oropharyngeal erythema.  Eyes: Pupils are equal, round, and reactive to light. Conjunctivae and EOM are normal. No scleral icterus.  Neck: Normal range of motion. Neck supple. No thyromegaly present.  Cardiovascular:  Normal rate, regular rhythm, normal heart sounds and intact distal pulses.   No murmur heard. Pulses:      Radial pulses are 2+ on the right side, and 2+ on the left side.  Pulmonary/Chest: Effort normal and breath sounds normal. No respiratory distress. He has no wheezes. He has no rales.  Abdominal: Soft. Bowel sounds are normal. He exhibits no distension and no mass. There is no tenderness. There is no rebound and no guarding.  Musculoskeletal: Normal range of motion. He exhibits no edema.  Lymphadenopathy:    He has no cervical adenopathy.  Neurological: He is alert and oriented to person, place, and time.  CN grossly intact, station and gait intact  Skin: Skin is warm and dry. No rash noted.  Psychiatric: He has a normal mood and affect. His behavior is normal. Judgment and thought content normal.  Nursing note and vitals reviewed.  Results for orders placed or performed in visit on 08/03/17  Hepatic function panel  Result Value Ref Range   Total Bilirubin 0.5 0.2 - 1.2 mg/dL   Bilirubin, Direct 0.2 0.0 - 0.3 mg/dL   Alkaline Phosphatase 37 (L) 39 - 117 U/L   AST 20 0 - 37 U/L   ALT 18 0 - 53 U/L   Total Protein 7.0 6.0 - 8.3 g/dL   Albumin 4.3 3.5 - 5.2 g/dL  Basic metabolic panel  Result Value Ref Range   Sodium 139 135 - 145 mEq/L   Potassium 4.4 3.5 - 5.1 mEq/L   Chloride 106 96 - 112 mEq/L   CO2 28 19 - 32 mEq/L   Glucose, Bld 98 70 - 99 mg/dL   BUN 19 6 - 23 mg/dL   Creatinine, Ser 1.16 0.40 - 1.50 mg/dL   Calcium 9.7 8.4 - 10.5 mg/dL   GFR 66.75 >60.00 mL/min  Lipid panel  Result Value Ref Range   Cholesterol 180 0 - 200 mg/dL   Triglycerides 114.0 0.0 - 149.0 mg/dL   HDL 41.70 >39.00 mg/dL   VLDL 22.8 0.0 - 40.0 mg/dL   LDL Cholesterol 115 (H) 0 - 99 mg/dL   Total CHOL/HDL Ratio 4    NonHDL 137.99       Assessment & Plan:   Problem List Items Addressed This Visit    Advanced care planning/counseling discussion    Advanced planning - does not have this  set up. Planning on setting up, to see lawyer. Wife would be HCPOA.       Anosmia    Benign exam today. Progressive. Will continue to monitor.  No dry mouth/eyes, memory concerns.       Family history of hemochromatosis   Health maintenance examination    Preventative protocols reviewed and updated unless pt declined. Discussed healthy diet and lifestyle.       Medicare annual wellness visit, subsequent - Primary    I have personally reviewed the Medicare Annual Wellness questionnaire and have noted 1. The patient's medical and social history 2. Their use of alcohol, tobacco or illicit drugs 3. Their current medications and supplements 4. The patient's functional ability including ADL's, fall risks, home safety risks and hearing or visual impairment. Cognitive function has been assessed and  addressed as indicated.  5. Diet and physical activity 6. Evidence for depression or mood disorders The patients weight, height, BMI have been recorded in the chart. I have made referrals, counseling and provided education to the patient based on review of the above and I have provided the pt with a written personalized care plan for preventive services. Provider list updated.. See scanned questionairre as needed for further documentation. Reviewed preventative protocols and updated unless pt declined.           Follow up plan: Return in about 1 year (around 08/05/2018) for medicare wellness visit, annual exam, prior fasting for blood work.  [name], MD

## 2018-02-21 DIAGNOSIS — H25813 Combined forms of age-related cataract, bilateral: Secondary | ICD-10-CM | POA: Diagnosis not present

## 2018-08-02 ENCOUNTER — Other Ambulatory Visit: Payer: Self-pay | Admitting: Family Medicine

## 2018-08-02 ENCOUNTER — Encounter: Payer: Self-pay | Admitting: Family Medicine

## 2018-08-02 DIAGNOSIS — E785 Hyperlipidemia, unspecified: Secondary | ICD-10-CM | POA: Insufficient documentation

## 2018-08-02 DIAGNOSIS — E7800 Pure hypercholesterolemia, unspecified: Secondary | ICD-10-CM

## 2018-08-02 DIAGNOSIS — Z8349 Family history of other endocrine, nutritional and metabolic diseases: Secondary | ICD-10-CM

## 2018-08-03 ENCOUNTER — Other Ambulatory Visit (INDEPENDENT_AMBULATORY_CARE_PROVIDER_SITE_OTHER): Payer: PPO

## 2018-08-03 DIAGNOSIS — Z8349 Family history of other endocrine, nutritional and metabolic diseases: Secondary | ICD-10-CM

## 2018-08-03 DIAGNOSIS — E7800 Pure hypercholesterolemia, unspecified: Secondary | ICD-10-CM

## 2018-08-03 LAB — COMPREHENSIVE METABOLIC PANEL
ALBUMIN: 4.4 g/dL (ref 3.5–5.2)
ALT: 24 U/L (ref 0–53)
AST: 23 U/L (ref 0–37)
Alkaline Phosphatase: 42 U/L (ref 39–117)
BILIRUBIN TOTAL: 0.8 mg/dL (ref 0.2–1.2)
BUN: 16 mg/dL (ref 6–23)
CALCIUM: 9.7 mg/dL (ref 8.4–10.5)
CHLORIDE: 106 meq/L (ref 96–112)
CO2: 28 meq/L (ref 19–32)
CREATININE: 1.28 mg/dL (ref 0.40–1.50)
GFR: 59.4 mL/min — AB (ref >60.00)
Glucose, Bld: 104 mg/dL — ABNORMAL HIGH (ref 70–99)
Potassium: 4.8 mEq/L (ref 3.5–5.1)
Sodium: 140 mEq/L (ref 135–145)
Total Protein: 7.2 g/dL (ref 6.0–8.3)

## 2018-08-03 LAB — LIPID PANEL
CHOL/HDL RATIO: 4
CHOLESTEROL: 181 mg/dL (ref 0–200)
HDL: 40.8 mg/dL (ref >39.00)
LDL CALC: 116 mg/dL — AB (ref 0–99)
NonHDL: 140.39
TRIGLYCERIDES: 124 mg/dL (ref 0.0–149.0)
VLDL: 24.8 mg/dL (ref 0.0–40.0)

## 2018-08-03 LAB — IBC PANEL
IRON: 112 ug/dL (ref 42–165)
Saturation Ratios: 35.1 % (ref 20.0–50.0)
TRANSFERRIN: 228 mg/dL (ref 212.0–360.0)

## 2018-08-08 ENCOUNTER — Encounter: Payer: PPO | Admitting: Family Medicine

## 2018-08-08 ENCOUNTER — Telehealth: Payer: Self-pay

## 2018-08-08 NOTE — Telephone Encounter (Signed)
PLEASE NOTE: All timestamps contained within this report are represented as [place] Standard Time. CONFIDENTIALTY NOTICE: This fax transmission is intended only for the addressee. It contains information that is legally privileged, confidential or otherwise protected from use or disclosure. If you are not the intended recipient, you are strictly prohibited from reviewing, disclosing, copying using or disseminating any of this information or taking any action in reliance on or regarding this information. If you have received this fax in error, please notify us immediately by telephone so that we can arrange for its return to [place]. Phone: [phone], Toll-Free: [phone], Fax: [phone] Page: 1 of 1 Call Id: [id number] [place] Night - Client Nonclinical Telephone Record [place] Night - Client Client Site [place] Physician [name] - MD Contact Type Call Who Is Calling Patient / Member / Family / Caregiver Caller Name Saman Umstead Caller Phone Number [phone] Patient Name Shane [name] Patient DOB May 12, 1951 Call Type Message Only Information Provided Reason for Call Request to [facility] Appointment Initial Comment Caller states she is needing to cancel and appointment for Tuesday morning at 10:30 AM for September 3rd. Additional Comment She does not want to be charged for the appointment. Call Closed By: Corey Harold Transaction Date/Time: 08/06/2018 3:00:13 PM (ET)

## 2018-08-18 ENCOUNTER — Ambulatory Visit (INDEPENDENT_AMBULATORY_CARE_PROVIDER_SITE_OTHER): Payer: PPO | Admitting: Family Medicine

## 2018-08-18 ENCOUNTER — Encounter: Payer: Self-pay | Admitting: Family Medicine

## 2018-08-18 VITALS — BP 120/66 | HR 60 | Temp 97.8°F | Ht 70.5 in | Wt 190.5 lb

## 2018-08-18 DIAGNOSIS — Z8349 Family history of other endocrine, nutritional and metabolic diseases: Secondary | ICD-10-CM

## 2018-08-18 DIAGNOSIS — E7800 Pure hypercholesterolemia, unspecified: Secondary | ICD-10-CM

## 2018-08-18 DIAGNOSIS — Z7189 Other specified counseling: Secondary | ICD-10-CM

## 2018-08-18 DIAGNOSIS — Z0000 Encounter for general adult medical examination without abnormal findings: Secondary | ICD-10-CM | POA: Diagnosis not present

## 2018-08-18 NOTE — Progress Notes (Signed)
BP 120/66 (BP Location: Left Arm, Patient Position: Sitting, Cuff Size: Normal)   Pulse 60   Temp 97.8 F (36.6 C) (Oral)   Ht 5' 10.5" (1.791 m)   Wt 190 lb 8 oz (86.4 kg)   SpO2 98%   BMI 26.95 kg/m    CC: CPE Subjective:    Patient ID: Shane [name], male    DOB: 1951-10-09, 67 y.o.   MRN: [id number]  HPI: Shane [name] is a 67 y.o. male presenting on 08/18/2018 for Medicare Wellness   Passes hearing screen today. Sees eye doctor yearly Denies falls, depression, anhedonia, sadness.  Preventative: Colonoscopy remotely normal. cologuard normal 2017. Prostate screening - discussed. Declines screening for now.  Flu shot - declines  Prevnar 2017, pneumovax today Tdap 01/2017 zostavax - declines  shingrix - discussed  Advanced planning - does not have this set up. Planning on setting up, to see lawyer. Wife would be HCPOA.  Seat belt use discussed  Sunscreen use discussed, no changing moles on skin. Not a smoker.  Alcohol - 1-2 beer/day Dentist yearly Eye exam yearly  Lives with wife Grown children Occ: retired Engineer, structural, Musician Edu: Bachelor of Art Activity: stays active working outdoors  Diet: some water, fruits/vegetables daily   Relevant past medical, surgical, family and social history reviewed and updated as indicated. Interim medical history since our last visit reviewed. Allergies and medications reviewed and updated. No outpatient medications prior to visit.   No facility-administered medications prior to visit.      Per HPI unless specifically indicated in ROS section below Review of Systems  Constitutional: Negative for activity change, appetite change, chills, fatigue, fever and unexpected weight change.  HENT: Negative for hearing loss.   Eyes: Negative for visual disturbance.  Respiratory: Negative for cough, chest tightness, shortness of breath and wheezing.   Cardiovascular: Negative for chest pain, palpitations and leg  swelling.  Gastrointestinal: Negative for abdominal distention, abdominal pain, blood in stool, constipation, diarrhea, nausea and vomiting.  Genitourinary: Negative for difficulty urinating and hematuria.  Musculoskeletal: Negative for arthralgias, myalgias and neck pain.  Skin: Negative for rash.  Neurological: Negative for dizziness, seizures, syncope and headaches.  Hematological: Negative for adenopathy. Does not bruise/bleed easily.  Psychiatric/Behavioral: Negative for dysphoric mood. The patient is not nervous/anxious.        Objective:    BP 120/66 (BP Location: Left Arm, Patient Position: Sitting, Cuff Size: Normal)   Pulse 60   Temp 97.8 F (36.6 C) (Oral)   Ht 5' 10.5" (1.791 m)   Wt 190 lb 8 oz (86.4 kg)   SpO2 98%   BMI 26.95 kg/m   Wt Readings from Last 3 Encounters:  08/18/18 190 lb 8 oz (86.4 kg)  08/05/17 187 lb (84.8 kg)  02/02/17 192 lb (87.1 kg)    Physical Exam  Constitutional: He is oriented to person, place, and time. He appears well-developed and well-nourished. No distress.  HENT:  Head: Normocephalic and atraumatic.  Right Ear: Hearing, tympanic membrane, external ear and ear canal normal.  Left Ear: Hearing, tympanic membrane, external ear and ear canal normal.  Nose: Nose normal.  Mouth/Throat: Uvula is midline, oropharynx is clear and moist and mucous membranes are normal. No oropharyngeal exudate, posterior oropharyngeal edema or posterior oropharyngeal erythema.  Eyes: Pupils are equal, round, and reactive to light. Conjunctivae and EOM are normal. No scleral icterus.  Neck: Normal range of motion. Neck supple. No thyromegaly present.  Cardiovascular: Normal rate, regular rhythm,  normal heart sounds and intact distal pulses.  No murmur heard. Pulses:      Radial pulses are 2+ on the right side, and 2+ on the left side.  Pulmonary/Chest: Effort normal and breath sounds normal. No respiratory distress. He has no wheezes. He has no rales.    Abdominal: Soft. Bowel sounds are normal. He exhibits no distension and no mass. There is no tenderness. There is no rebound and no guarding.  Musculoskeletal: Normal range of motion. He exhibits no edema.  Lymphadenopathy:    He has no cervical adenopathy.  Neurological: He is alert and oriented to person, place, and time.  CN grossly intact, station and gait intact Recall 3/3 - from last year's  5/5 serial 7s  Skin: Skin is warm and dry. No rash noted.  Psychiatric: He has a normal mood and affect. His behavior is normal. Judgment and thought content normal.  Nursing note and vitals reviewed.  Results for orders placed or performed in visit on 08/03/18  IBC panel  Result Value Ref Range   Iron 112 42 - 165 ug/dL   Transferrin 228.0 212.0 - 360.0 mg/dL   Saturation Ratios 35.1 20.0 - 50.0 %  Comprehensive metabolic panel  Result Value Ref Range   Sodium 140 135 - 145 mEq/L   Potassium 4.8 3.5 - 5.1 mEq/L   Chloride 106 96 - 112 mEq/L   CO2 28 19 - 32 mEq/L   Glucose, Bld 104 (H) 70 - 99 mg/dL   BUN 16 6 - 23 mg/dL   Creatinine, Ser 1.28 0.40 - 1.50 mg/dL   Total Bilirubin 0.8 0.2 - 1.2 mg/dL   Alkaline Phosphatase 42 39 - 117 U/L   AST 23 0 - 37 U/L   ALT 24 0 - 53 U/L   Total Protein 7.2 6.0 - 8.3 g/dL   Albumin 4.4 3.5 - 5.2 g/dL   Calcium 9.7 8.4 - 10.5 mg/dL   GFR 59.40 (L) >60.00 mL/min  Lipid panel  Result Value Ref Range   Cholesterol 181 0 - 200 mg/dL   Triglycerides 124.0 0.0 - 149.0 mg/dL   HDL 40.80 >39.00 mg/dL   VLDL 24.8 0.0 - 40.0 mg/dL   LDL Cholesterol 116 (H) 0 - 99 mg/dL   Total CHOL/HDL Ratio 4    NonHDL 140.39       Assessment & Plan:   Problem List Items Addressed This Visit    Medicare annual wellness visit, subsequent - Primary    I have personally reviewed the Medicare Annual Wellness questionnaire and have noted 1. The patient's medical and social history 2. Their use of alcohol, tobacco or illicit drugs 3. Their current medications  and supplements 4. The patient's functional ability including ADL's, fall risks, home safety risks and hearing or visual impairment. Cognitive function has been assessed and addressed as indicated.  5. Diet and physical activity 6. Evidence for depression or mood disorders The patients weight, height, BMI have been recorded in the chart. I have made referrals, counseling and provided education to the patient based on review of the above and I have provided the pt with a written personalized care plan for preventive services. Provider list updated.. See scanned questionairre as needed for further documentation. Reviewed preventative protocols and updated unless pt declined.       HLD (hyperlipidemia)    Mild off statin. Reviewed 10-yr risk. Not interested in statin. rec increased legumes to lower LDL The 10-year ASCVD risk score Mikey Bussing DC Brooke Bonito., et  al., 2013) is: 14.8%   Values used to calculate the score:     Age: 33 years     Sex: Male     Is Non-Hispanic African American: No     Diabetic: No     Tobacco smoker: No     Systolic Blood Pressure: 854 mmHg     Is BP treated: No     HDL Cholesterol: 40.8 mg/dL     Total Cholesterol: 181 mg/dL       Family history of hemochromatosis    Iron and LFT normal.       Advanced care planning/counseling discussion    Advanced planning - does not have this set up. Planning on setting up, to see lawyer. Wife would be HCPOA.           No orders of the defined types were placed in this encounter.  No orders of the defined types were placed in this encounter.   Follow up plan: Return in about 1 year (around 08/19/2019) for medicare wellness visit.  [name], MD

## 2018-08-18 NOTE — Assessment & Plan Note (Signed)

## 2018-08-18 NOTE — Assessment & Plan Note (Signed)
Iron and LFT normal.

## 2018-08-18 NOTE — Patient Instructions (Addendum)
Work on Scientist, physiological.  Work on [employer]. You are doing well today. Return as needed or in 1 year for next wellness visit.   Health Maintenance, Male A healthy lifestyle and preventive care is important for your health and wellness. Ask your health care provider about what schedule of regular examinations is right for you. What should I know about weight and diet? Eat a Healthy Diet  Eat plenty of vegetables, fruits, whole grains, low-fat dairy products, and lean protein.  Do not eat a lot of foods high in solid fats, added sugars, or salt.  Maintain a Healthy Weight Regular exercise can help you achieve or maintain a healthy weight. You should:  Do at least 150 minutes of exercise each week. The exercise should increase your heart rate and make you sweat (moderate-intensity exercise).  Do strength-training exercises at least twice a week.  Watch Your Levels of Cholesterol and Blood Lipids  Have your blood tested for lipids and cholesterol every 5 years starting at 69 years of age. If you are at high risk for heart disease, you should start having your blood tested when you are 68 years old. You may need to have your cholesterol levels checked more often if: ? Your lipid or cholesterol levels are high. ? You are older than 68 years of age. ? You are at high risk for heart disease.  What should I know about cancer screening? Many types of cancers can be detected early and may often be prevented. Lung Cancer  You should be screened every year for lung cancer if: ? You are a current smoker who has smoked for at least 30 years. ? You are a former smoker who has quit within the past 15 years.  Talk to your health care provider about your screening options, when you should start screening, and how often you should be screened.  Colorectal Cancer  Routine colorectal cancer screening usually begins at 68 years of age and should be repeated every 5-10 years until you are 68  years old. You may need to be screened more often if early forms of precancerous polyps or small growths are found. Your health care provider may recommend screening at an earlier age if you have risk factors for colon cancer.  Your health care provider may recommend using home test kits to check for hidden blood in the stool.  A small camera at the end of a tube can be used to examine your colon (sigmoidoscopy or colonoscopy). This checks for the earliest forms of colorectal cancer.  Prostate and Testicular Cancer  Depending on your age and overall health, your health care provider may do certain tests to screen for prostate and testicular cancer.  Talk to your health care provider about any symptoms or concerns you have about testicular or prostate cancer.  Skin Cancer  Check your skin from head to toe regularly.  Tell your health care provider about any new moles or changes in moles, especially if: ? There is a change in a mole's size, shape, or color. ? You have a mole that is larger than a pencil eraser.  Always use sunscreen. Apply sunscreen liberally and repeat throughout the day.  Protect yourself by wearing long sleeves, pants, a wide-brimmed hat, and sunglasses when outside.  What should I know about heart disease, diabetes, and high blood pressure?  If you are 39-12 years of age, have your blood pressure checked every 3-5 years. If you are 70 years of age or  older, have your blood pressure checked every year. You should have your blood pressure measured twice-once when you are at a hospital or clinic, and once when you are not at a hospital or clinic. Record the average of the two measurements. To check your blood pressure when you are not at a hospital or clinic, you can use: ? An automated blood pressure machine at a pharmacy. ? A home blood pressure monitor.  Talk to your health care provider about your target blood pressure.  If you are between 35-40 years old, ask your  health care provider if you should take aspirin to prevent heart disease.  Have regular diabetes screenings by checking your fasting blood sugar level. ? If you are at a normal weight and have a low risk for diabetes, have this test once every three years after the age of 34. ? If you are overweight and have a high risk for diabetes, consider being tested at a younger age or more often.  A one-time screening for abdominal aortic aneurysm (AAA) by ultrasound is recommended for men aged 56-75 years who are current or former smokers. What should I know about preventing infection? Hepatitis B If you have a higher risk for hepatitis B, you should be screened for this virus. Talk with your health care provider to find out if you are at risk for hepatitis B infection. Hepatitis C Blood testing is recommended for:  Everyone born from [birth year] through 1966.  Anyone with known risk factors for hepatitis C.  Sexually Transmitted Diseases (STDs)  You should be screened each year for STDs including gonorrhea and chlamydia if: ? You are sexually active and are younger than 68 years of age. ? You are older than 68 years of age and your health care provider tells you that you are at risk for this type of infection. ? Your sexual activity has changed since you were last screened and you are at an increased risk for chlamydia or gonorrhea. Ask your health care provider if you are at risk.  Talk with your health care provider about whether you are at high risk of being infected with HIV. Your health care provider may recommend a prescription medicine to help prevent HIV infection.  What else can I do?  Schedule regular health, dental, and eye exams.  Stay current with your vaccines (immunizations).  Do not use any tobacco products, such as cigarettes, chewing tobacco, and e-cigarettes. If you need help quitting, ask your health care provider.  Limit alcohol intake to no more than 2 drinks per day. One  drink equals 12 ounces of beer, 5 ounces of wine, or 1 ounces of hard liquor.  Do not use street drugs.  Do not share needles.  Ask your health care provider for help if you need support or information about quitting drugs.  Tell your health care provider if you often feel depressed.  Tell your health care provider if you have ever been abused or do not feel safe at home. This information is not intended to replace advice given to you by your health care provider. Make sure you discuss any questions you have with your health care provider. Document Released: 05/20/2008 Document Revised: 07/21/2016 Document Reviewed: 08/26/2015 Elsevier Interactive Patient Education  [employer].

## 2018-08-18 NOTE — Assessment & Plan Note (Signed)
Advanced planning - does not have this set up. Planning on setting up, to see lawyer. Wife would be HCPOA.

## 2018-08-18 NOTE — Assessment & Plan Note (Addendum)
Mild off statin. Reviewed 10-yr risk. Not interested in statin. rec increased legumes to lower LDL The 10-year ASCVD risk score Shane [name]., et al., 2013) is: 14.8%   Values used to calculate the score:     Age: 67 years     Sex: Male     Is Non-Hispanic African American: No     Diabetic: No     Tobacco smoker: No     Systolic Blood Pressure: 536 mmHg     Is BP treated: No     HDL Cholesterol: 40.8 mg/dL     Total Cholesterol: 181 mg/dL

## 2018-11-10 ENCOUNTER — Ambulatory Visit (INDEPENDENT_AMBULATORY_CARE_PROVIDER_SITE_OTHER): Payer: PPO | Admitting: Family Medicine

## 2018-11-10 ENCOUNTER — Encounter: Payer: Self-pay | Admitting: Family Medicine

## 2018-11-10 VITALS — BP 134/74 | HR 51 | Temp 97.8°F | Ht 70.5 in | Wt 195.5 lb

## 2018-11-10 DIAGNOSIS — R3989 Other symptoms and signs involving the genitourinary system: Secondary | ICD-10-CM | POA: Diagnosis not present

## 2018-11-10 DIAGNOSIS — R300 Dysuria: Secondary | ICD-10-CM | POA: Diagnosis not present

## 2018-11-10 LAB — POC URINALSYSI DIPSTICK (AUTOMATED)
BILIRUBIN UA: NEGATIVE
GLUCOSE UA: NEGATIVE
KETONES UA: NEGATIVE
Leukocytes, UA: NEGATIVE
Nitrite, UA: NEGATIVE
PH UA: 5.5 (ref 5.0–8.0)
Protein, UA: NEGATIVE
RBC UA: NEGATIVE
Spec Grav, UA: 1.02 (ref 1.010–1.025)
Urobilinogen, UA: 0.2 E.U./dL

## 2018-11-10 MED ORDER — SULFAMETHOXAZOLE-TRIMETHOPRIM 800-160 MG PO TABS: 1.0000 | ORAL_TABLET | Freq: Two times a day (BID) | ORAL | 0 refills | Status: DC

## 2018-11-10 NOTE — Assessment & Plan Note (Signed)
2 mo h/o urgency, frequency and mild dysuria. UA today normal. UCx sent. Treat as possible UTI/prostatitis with bactrim DS 2 wk course.

## 2018-11-10 NOTE — Assessment & Plan Note (Signed)
See above. Treat possible prostatitis/UTI with 2 wk bactrim course. For abnormal prostate exam, check PSA today. Discussed possible urology referral for further evaluation.

## 2018-11-10 NOTE — Progress Notes (Signed)
BP 134/74 (BP Location: Left Arm, Patient Position: Sitting, Cuff Size: Normal)   Pulse (!) 51   Temp 97.8 F (36.6 C) (Oral)   Ht 5' 10.5" (1.791 m)   Wt 195 lb 8 oz (88.7 kg)   SpO2 97%   BMI 27.65 kg/m    CC: urinary symptoms Subjective:    Patient ID: Shane [name], male    DOB: 05-19-1951, 67 y.o.   MRN: [id number]  HPI: Shane [name] is a 67 y.o. male presenting on 11/10/2018 for Dysuria (C/o burining when urinating, urinary frequency and nocturia. Sxs started about 2 mos ago. )   2 mo h/o increased urinary urgency with standing after prolonged sitting. Some dysuria present as well. Increased frequency as well. Chronic nocturia x2, unchanged. Feels completely emptying.   No fevers/chills, nausea/vomiting, flank pain, abd pain, rectal or lower back pain. No hematuria. No hesitancy or dribbling or weakening of stream.  No stress incontinence symptoms.   No rash, external itchiness, urethral discharge.  No increase spicy foods. Drinks 1-2 cokes/wk, no change.  Does sit prolonged periods on hard seat for hunting - this started before hunting season started however.   No results found for: PSA   Relevant past medical, surgical, family and social history reviewed and updated as indicated. Interim medical history since our last visit reviewed. Allergies and medications reviewed and updated. No outpatient medications prior to visit.   No facility-administered medications prior to visit.      Per HPI unless specifically indicated in ROS section below Review of Systems     Objective:    BP 134/74 (BP Location: Left Arm, Patient Position: Sitting, Cuff Size: Normal)   Pulse (!) 51   Temp 97.8 F (36.6 C) (Oral)   Ht 5' 10.5" (1.791 m)   Wt 195 lb 8 oz (88.7 kg)   SpO2 97%   BMI 27.65 kg/m   Wt Readings from Last 3 Encounters:  11/10/18 195 lb 8 oz (88.7 kg)  08/18/18 190 lb 8 oz (86.4 kg)  08/05/17 187 lb (84.8 kg)    Physical Exam  Constitutional: He appears  well-developed and well-nourished. No distress.  Abdominal: Soft. Bowel sounds are normal. He exhibits no distension and no mass. There is no hepatosplenomegaly. There is no tenderness. There is no rebound, no guarding, no CVA tenderness and negative Murphy's sign. No hernia.  Genitourinary: Rectum normal. Rectal exam shows no external hemorrhoid, no internal hemorrhoid, no fissure, no mass, no tenderness and anal tone normal. Prostate is not enlarged and not tender.  Genitourinary Comments: Left prostate lobe induration, nontender  Nursing note and vitals reviewed.  Results for orders placed or performed in visit on 11/10/18  POCT Urinalysis Dipstick (Automated)  Result Value Ref Range   Color, UA yellow    Clarity, UA clear    Glucose, UA Negative Negative   Bilirubin, UA negative    Ketones, UA negative    Spec Grav, UA 1.020 1.010 - 1.025   Blood, UA negative    pH, UA 5.5 5.0 - 8.0   Protein, UA Negative Negative   Urobilinogen, UA 0.2 0.2 or 1.0 E.U./dL   Nitrite, UA negative    Leukocytes, UA Negative Negative      Assessment & Plan:   Problem List Items Addressed This Visit    Dysuria - Primary    2 mo h/o urgency, frequency and mild dysuria. UA today normal. UCx sent. Treat as possible UTI/prostatitis with bactrim DS 2 wk course.  Relevant Orders   POCT Urinalysis Dipstick (Automated) (Completed)   Urine Culture   Abnormal prostate exam    See above. Treat possible prostatitis/UTI with 2 wk bactrim course. For abnormal prostate exam, check PSA today. Discussed possible urology referral for further evaluation.       Relevant Orders   PSA, Total with Reflex to PSA, Free       Meds ordered this encounter  Medications  . sulfamethoxazole-trimethoprim (BACTRIM DS,SEPTRA DS) 800-160 MG tablet    Sig: Take 1 tablet by mouth 2 (two) times daily.    Dispense:  28 tablet    Refill:  0   Orders Placed This Encounter  Procedures  . Urine Culture  . PSA, Total  with Reflex to PSA, Free  . POCT Urinalysis Dipstick (Automated)    Follow up plan: Return if symptoms worsen or fail to improve.  [name], MD

## 2018-11-10 NOTE — Patient Instructions (Addendum)
Urine overall ok today - we will check PSA and urine culture.  Start bactrim DS twice daily for 2 weeks.  We will be in touch with results and plan from here.

## 2018-11-11 LAB — URINE CULTURE
MICRO NUMBER:: 91463884
Result:: NO GROWTH
SPECIMEN QUALITY:: ADEQUATE

## 2018-11-13 ENCOUNTER — Ambulatory Visit: Payer: PPO | Admitting: Family Medicine

## 2018-11-13 LAB — PSA, TOTAL WITH REFLEX TO PSA, FREE: PSA, Total: 2.7 ng/mL (ref <=4.0)

## 2018-11-16 ENCOUNTER — Other Ambulatory Visit: Payer: Self-pay | Admitting: Family Medicine

## 2018-11-16 DIAGNOSIS — R3989 Other symptoms and signs involving the genitourinary system: Secondary | ICD-10-CM

## 2018-11-16 DIAGNOSIS — R300 Dysuria: Secondary | ICD-10-CM

## 2018-11-22 ENCOUNTER — Telehealth: Payer: Self-pay | Admitting: *Deleted

## 2018-11-22 MED ORDER — FIRST-DUKES MOUTHWASH MT SUSP: 5.0000 mL | Freq: Three times a day (TID) | OROMUCOSAL | 0 refills | Status: DC

## 2018-11-22 MED ORDER — DOXYCYCLINE HYCLATE 100 MG PO TABS: 100.0000 mg | ORAL_TABLET | Freq: Two times a day (BID) | ORAL | 0 refills | Status: DC

## 2018-11-22 NOTE — Telephone Encounter (Signed)
Spoke to pt who states he was seen on  Friday, 12/6 and prescribed and began taking Bactrim for UTI tx. He states he began to turn red, but thought it may have been from working outside. On Wed or Thur, the following week, he noticed he had broken out in hives. He states he took the medication through Friday, 12/13 and then d/c, and the redness disappeared but hives were still present. Pt states he is "doing fine now" and will still see urology for UTI and prostate. Wanted to send to Dr Darnell Level as Juluis Rainier; pt declined appt to be seen

## 2018-11-22 NOTE — Addendum Note (Signed)
Addended by: [name] on: 11/22/2018 06:43 PM   Modules accepted: Orders

## 2018-11-22 NOTE — Telephone Encounter (Signed)
Spoke with patient. Will rx doxy 2wk course Dukes mouthwash for possible thrush - film on tongue, change in taste.

## 2018-11-22 NOTE — Telephone Encounter (Signed)
Spoke with pt's wife, [name] (on dpr), relaying message per Dr. Henriette Combs understanding. States pt still c/o burning with urination and having to urinate whenever he changes positions while sitting.

## 2018-11-22 NOTE — Telephone Encounter (Signed)
Agree - likely had reaction to sulfa drug (bactrim). I have added this to allergy list.   Did urinary symptoms improve on bactrim 1 wk course? If feeling ok, would just have him wait until urology appt, but let us know sooner if dysuria returns as appt is not til next month.

## 2018-12-08 ENCOUNTER — Telehealth: Payer: Self-pay | Admitting: *Deleted

## 2018-12-08 NOTE — Telephone Encounter (Signed)
Spoke with patient - doxy course didn't make any change in symptoms. Ongoing dysuria, discomfort, nocturia, new urge to stool.  rec trial NSAID course aleve 1-2 bid scheduled over next 1-2 wks.  Will see if we can expedite uro appt.

## 2018-12-08 NOTE — Telephone Encounter (Signed)
[facility] couldn't see patient any sooner. [facility] and faxed Urgent request to their Triage nurse Mickel Baas, She will look for a sooner Appt with one of the [facility] Dr';s and will call the patient if she finds one. Notified patient to be expecting a call from [facility].

## 2018-12-08 NOTE — Telephone Encounter (Signed)
Spoke to pt who states he was seen on 12/6 and treated for UTI. Pt has completed the abx and states he has had no improvement. He is still experiencing discomfort when urinating and states he is to see the urologist 01/04/2019. He advised he "can make it until then, but it'll be really uncomfortable" and is wanting to know should he receive another round of abx until he can be seen by urology. pls advise

## 2018-12-11 NOTE — Telephone Encounter (Signed)
Noted. Thanks.

## 2018-12-19 DIAGNOSIS — N411 Chronic prostatitis: Secondary | ICD-10-CM | POA: Diagnosis not present

## 2019-01-01 ENCOUNTER — Ambulatory Visit: Payer: Self-pay | Admitting: Urology

## 2019-01-04 ENCOUNTER — Encounter

## 2019-01-04 ENCOUNTER — Ambulatory Visit: Payer: Self-pay | Admitting: Urology

## 2019-02-07 DIAGNOSIS — N411 Chronic prostatitis: Secondary | ICD-10-CM | POA: Diagnosis not present

## 2019-02-12 DIAGNOSIS — R102 Pelvic and perineal pain: Secondary | ICD-10-CM | POA: Diagnosis not present

## 2019-02-12 DIAGNOSIS — M6281 Muscle weakness (generalized): Secondary | ICD-10-CM | POA: Diagnosis not present

## 2019-02-12 DIAGNOSIS — M62838 Other muscle spasm: Secondary | ICD-10-CM | POA: Diagnosis not present

## 2019-02-20 DIAGNOSIS — M62838 Other muscle spasm: Secondary | ICD-10-CM | POA: Diagnosis not present

## 2019-02-20 DIAGNOSIS — M6289 Other specified disorders of muscle: Secondary | ICD-10-CM | POA: Diagnosis not present

## 2019-02-20 DIAGNOSIS — M6281 Muscle weakness (generalized): Secondary | ICD-10-CM | POA: Diagnosis not present

## 2019-02-20 DIAGNOSIS — N410 Acute prostatitis: Secondary | ICD-10-CM | POA: Diagnosis not present

## 2019-02-20 DIAGNOSIS — R102 Pelvic and perineal pain: Secondary | ICD-10-CM | POA: Diagnosis not present

## 2019-03-06 DIAGNOSIS — M6281 Muscle weakness (generalized): Secondary | ICD-10-CM | POA: Diagnosis not present

## 2019-03-06 DIAGNOSIS — M6289 Other specified disorders of muscle: Secondary | ICD-10-CM | POA: Diagnosis not present

## 2019-03-06 DIAGNOSIS — N411 Chronic prostatitis: Secondary | ICD-10-CM | POA: Diagnosis not present

## 2019-03-06 DIAGNOSIS — M62838 Other muscle spasm: Secondary | ICD-10-CM | POA: Diagnosis not present

## 2019-03-06 DIAGNOSIS — R102 Pelvic and perineal pain: Secondary | ICD-10-CM | POA: Diagnosis not present

## 2019-03-19 DIAGNOSIS — N411 Chronic prostatitis: Secondary | ICD-10-CM | POA: Diagnosis not present

## 2019-03-19 DIAGNOSIS — I480 Paroxysmal atrial fibrillation: Secondary | ICD-10-CM | POA: Diagnosis not present

## 2019-03-19 DIAGNOSIS — B351 Tinea unguium: Secondary | ICD-10-CM | POA: Diagnosis not present

## 2019-03-19 DIAGNOSIS — R102 Pelvic and perineal pain: Secondary | ICD-10-CM | POA: Diagnosis not present

## 2019-03-19 DIAGNOSIS — M6281 Muscle weakness (generalized): Secondary | ICD-10-CM | POA: Diagnosis not present

## 2019-03-19 DIAGNOSIS — I739 Peripheral vascular disease, unspecified: Secondary | ICD-10-CM | POA: Diagnosis not present

## 2019-03-19 DIAGNOSIS — I361 Nonrheumatic tricuspid (valve) insufficiency: Secondary | ICD-10-CM | POA: Diagnosis not present

## 2019-03-19 DIAGNOSIS — M62838 Other muscle spasm: Secondary | ICD-10-CM | POA: Diagnosis not present

## 2019-03-19 DIAGNOSIS — E1151 Type 2 diabetes mellitus with diabetic peripheral angiopathy without gangrene: Secondary | ICD-10-CM | POA: Diagnosis not present

## 2019-03-26 DIAGNOSIS — R102 Pelvic and perineal pain: Secondary | ICD-10-CM | POA: Diagnosis not present

## 2019-03-26 DIAGNOSIS — N411 Chronic prostatitis: Secondary | ICD-10-CM | POA: Diagnosis not present

## 2019-05-10 DIAGNOSIS — S91312A Laceration without foreign body, left foot, initial encounter: Secondary | ICD-10-CM | POA: Diagnosis not present

## 2019-05-28 DIAGNOSIS — H25813 Combined forms of age-related cataract, bilateral: Secondary | ICD-10-CM | POA: Diagnosis not present

## 2019-06-06 DIAGNOSIS — C61 Malignant neoplasm of prostate: Secondary | ICD-10-CM

## 2019-06-06 DIAGNOSIS — Z86711 Personal history of pulmonary embolism: Secondary | ICD-10-CM

## 2019-06-06 HISTORY — DX: Malignant neoplasm of prostate: C61

## 2019-06-06 HISTORY — DX: Personal history of pulmonary embolism: Z86.711

## 2019-06-26 ENCOUNTER — Other Ambulatory Visit: Payer: Self-pay | Admitting: Urology

## 2019-06-26 DIAGNOSIS — C678 Malignant neoplasm of overlapping sites of bladder: Secondary | ICD-10-CM | POA: Diagnosis not present

## 2019-06-26 DIAGNOSIS — R310 Gross hematuria: Secondary | ICD-10-CM | POA: Diagnosis not present

## 2019-06-26 DIAGNOSIS — N411 Chronic prostatitis: Secondary | ICD-10-CM | POA: Diagnosis not present

## 2019-06-28 ENCOUNTER — Encounter: Payer: Self-pay | Admitting: Family Medicine

## 2019-06-28 DIAGNOSIS — C61 Malignant neoplasm of prostate: Secondary | ICD-10-CM | POA: Insufficient documentation

## 2019-06-28 DIAGNOSIS — C7911 Secondary malignant neoplasm of bladder: Secondary | ICD-10-CM | POA: Insufficient documentation

## 2019-06-28 HISTORY — DX: Malignant neoplasm of prostate: C61

## 2019-06-29 DIAGNOSIS — R310 Gross hematuria: Secondary | ICD-10-CM | POA: Diagnosis not present

## 2019-07-02 DIAGNOSIS — R310 Gross hematuria: Secondary | ICD-10-CM | POA: Diagnosis not present

## 2019-07-02 DIAGNOSIS — N130 Hydronephrosis with ureteropelvic junction obstruction: Secondary | ICD-10-CM | POA: Diagnosis not present

## 2019-07-02 DIAGNOSIS — N1330 Unspecified hydronephrosis: Secondary | ICD-10-CM | POA: Diagnosis not present

## 2019-07-02 NOTE — Progress Notes (Signed)
PAtient's renal function has significantly worsened since last check in August. (06/25/19 BUN/Cr 39/2.5).  CT scan in office without contrast shows bilateral hydronephrosis.  He will need a CMP prior to his surgery to check his potassium and other electrolytes.  I couldn't order these prior to his encounter for "epic" reasons.

## 2019-07-03 ENCOUNTER — Other Ambulatory Visit (HOSPITAL_COMMUNITY)
Admission: RE | Admit: 2019-07-03 | Discharge: 2019-07-03 | Disposition: A | Discharge status: Home / Self Care | Payer: PPO | Source: Ambulatory Visit | Attending: Urology | Admitting: Urology

## 2019-07-03 DIAGNOSIS — Z20828 Contact with and (suspected) exposure to other viral communicable diseases: Secondary | ICD-10-CM | POA: Insufficient documentation

## 2019-07-03 LAB — SARS CORONAVIRUS 2 (TAT 6-24 HRS): SARS Coronavirus 2: NEGATIVE

## 2019-07-03 NOTE — Patient Instructions (Addendum)
YOU HAVE HAD A COVID 19 TEST, PLEASE CONTINUE YOUR QUARANTINE INSTRUCTIONS AS OUTLINED IN YOUR HANDOUT.                Shane [name]  07/03/2019   Your procedure is scheduled on: 07-06-19    Report to [facility] Main  Entrance    Report to Admitting at 7:30 AM   1 VISITOR IS ALLOWED TO WAIT IN WAITING ROOM  ONLY DAY OF YOUR SURGERY.    Call this number if you have problems the morning of surgery [phone]    Remember: Do not eat food or drink liquids :After Midnight.     Take these medicines the morning of surgery with A SIP OF WATER: Flomax  BRUSH YOUR TEETH MORNING OF SURGERY AND RINSE YOUR MOUTH OUT, NO CHEWING GUM CANDY OR MINTS.                                You may not have any metal on your body including hair pins and              piercings     Do not wear jewelry, cologne, lotions, powders or deodorant                       Men may shave face and neck.   Do not bring valuables to the hospital. [place].  Contacts, dentures or bridgework may not be worn into surgery.               Please read over the following fact sheets you were given: _____________________________________________________________________             [facility] - Preparing for Surgery Before surgery, you can play an important role.  Because skin is not sterile, your skin needs to be as free of germs as possible.  You can reduce the number of germs on your skin by washing with CHG (chlorahexidine gluconate) soap before surgery.  CHG is an antiseptic cleaner which kills germs and bonds with the skin to continue killing germs even after washing. Please DO NOT use if you have an allergy to CHG or antibacterial soaps.  If your skin becomes reddened/irritated stop using the CHG and inform your nurse when you arrive at Short Stay. Do not shave (including legs and underarms) for at least 48 hours prior to the first CHG shower.  You may  shave your face/neck. Please follow these instructions carefully:  1.  Shower with CHG Soap the night before surgery and the  morning of Surgery.  2.  If you choose to wash your hair, wash your hair first as usual with your  normal  shampoo.  3.  After you shampoo, rinse your hair and body thoroughly to remove the  shampoo.                           4.  Use CHG as you would any other liquid soap.  You can apply chg directly  to the skin and wash                       Gently with a scrungie or clean washcloth.  5.  Apply the CHG Soap to your  body ONLY FROM THE NECK DOWN.   Do not use on face/ open                           Wound or open sores. Avoid contact with eyes, ears mouth and genitals (private parts).                       Wash face,  Genitals (private parts) with your normal soap.             6.  Wash thoroughly, paying special attention to the area where your surgery  will be performed.  7.  Thoroughly rinse your body with warm water from the neck down.  8.  DO NOT shower/wash with your normal soap after using and rinsing off  the CHG Soap.                9.  Pat yourself dry with a clean towel.            10.  Wear clean pajamas.            11.  Place clean sheets on your bed the night of your first shower and do not  sleep with pets. Day of Surgery : Do not apply any lotions/deodorants the morning of surgery.  Please wear clean clothes to the hospital/surgery center.  FAILURE TO FOLLOW THESE INSTRUCTIONS MAY RESULT IN THE CANCELLATION OF YOUR SURGERY PATIENT SIGNATURE_________________________________  NURSE SIGNATURE__________________________________  ________________________________________________________________________

## 2019-07-04 ENCOUNTER — Encounter (HOSPITAL_COMMUNITY): Payer: Self-pay

## 2019-07-04 ENCOUNTER — Encounter (HOSPITAL_COMMUNITY)
Admission: RE | Admit: 2019-07-04 | Discharge: 2019-07-04 | Disposition: A | Discharge status: Home / Self Care | Payer: PPO | Source: Ambulatory Visit | Attending: Urology | Admitting: Urology

## 2019-07-04 ENCOUNTER — Other Ambulatory Visit: Payer: Self-pay

## 2019-07-04 DIAGNOSIS — C679 Malignant neoplasm of bladder, unspecified: Secondary | ICD-10-CM | POA: Diagnosis present

## 2019-07-04 DIAGNOSIS — E785 Hyperlipidemia, unspecified: Secondary | ICD-10-CM | POA: Diagnosis not present

## 2019-07-04 DIAGNOSIS — N138 Other obstructive and reflux uropathy: Secondary | ICD-10-CM | POA: Diagnosis not present

## 2019-07-04 DIAGNOSIS — R430 Anosmia: Secondary | ICD-10-CM | POA: Diagnosis not present

## 2019-07-04 DIAGNOSIS — Z882 Allergy status to sulfonamides status: Secondary | ICD-10-CM | POA: Diagnosis not present

## 2019-07-04 DIAGNOSIS — C61 Malignant neoplasm of prostate: Secondary | ICD-10-CM | POA: Diagnosis not present

## 2019-07-04 DIAGNOSIS — D494 Neoplasm of unspecified behavior of bladder: Secondary | ICD-10-CM | POA: Diagnosis not present

## 2019-07-04 DIAGNOSIS — N3941 Urge incontinence: Secondary | ICD-10-CM | POA: Diagnosis not present

## 2019-07-04 DIAGNOSIS — R310 Gross hematuria: Secondary | ICD-10-CM | POA: Diagnosis not present

## 2019-07-04 DIAGNOSIS — N1339 Other hydronephrosis: Secondary | ICD-10-CM | POA: Diagnosis not present

## 2019-07-04 DIAGNOSIS — Z880 Allergy status to penicillin: Secondary | ICD-10-CM | POA: Diagnosis not present

## 2019-07-04 DIAGNOSIS — N1330 Unspecified hydronephrosis: Secondary | ICD-10-CM | POA: Diagnosis not present

## 2019-07-04 DIAGNOSIS — C7911 Secondary malignant neoplasm of bladder: Secondary | ICD-10-CM | POA: Diagnosis not present

## 2019-07-04 DIAGNOSIS — N130 Hydronephrosis with ureteropelvic junction obstruction: Secondary | ICD-10-CM | POA: Diagnosis not present

## 2019-07-04 DIAGNOSIS — Z885 Allergy status to narcotic agent status: Secondary | ICD-10-CM | POA: Diagnosis not present

## 2019-07-04 LAB — CBC
HCT: 36.5 % — ABNORMAL LOW (ref 39.0–52.0)
Hemoglobin: 12.1 g/dL — ABNORMAL LOW (ref 13.0–17.0)
MCH: 32.3 pg (ref 26.0–34.0)
MCHC: 33.2 g/dL (ref 30.0–36.0)
MCV: 97.3 fL (ref 80.0–100.0)
Platelets: 257 10*3/uL (ref 150–400)
RBC: 3.75 MIL/uL — ABNORMAL LOW (ref 4.22–5.81)
RDW: 11.9 % (ref 11.5–15.5)
WBC: 7.2 10*3/uL (ref 4.0–10.5)
nRBC: 0 % (ref 0.0–0.2)

## 2019-07-04 LAB — BASIC METABOLIC PANEL
Anion gap: 7 (ref 5–15)
BUN: 31 mg/dL — ABNORMAL HIGH (ref 8–23)
CO2: 22 mmol/L (ref 22–32)
Calcium: 9.3 mg/dL (ref 8.9–10.3)
Chloride: 110 mmol/L (ref 98–111)
Creatinine, Ser: 2.06 mg/dL — ABNORMAL HIGH (ref 0.61–1.24)
GFR calc Af Amer: 37 mL/min — ABNORMAL LOW (ref >60)
GFR calc non Af Amer: 32 mL/min — ABNORMAL LOW (ref >60)
Glucose, Bld: 96 mg/dL (ref 70–99)
Potassium: 4.5 mmol/L (ref 3.5–5.1)
Sodium: 139 mmol/L (ref 135–145)

## 2019-07-04 NOTE — Progress Notes (Signed)
BMP result routed to Dr. [name] for review

## 2019-07-06 ENCOUNTER — Ambulatory Visit (HOSPITAL_COMMUNITY): Payer: PPO

## 2019-07-06 ENCOUNTER — Ambulatory Visit (HOSPITAL_COMMUNITY): Payer: PPO | Admitting: Physician Assistant

## 2019-07-06 ENCOUNTER — Inpatient Hospital Stay (HOSPITAL_COMMUNITY)
Admission: RE | Admit: 2019-07-06 | Discharge: 2019-07-07 | DRG: 715 | Disposition: A | Discharge status: Home / Self Care | Payer: PPO | Attending: Urology | Admitting: Urology

## 2019-07-06 ENCOUNTER — Encounter (HOSPITAL_COMMUNITY)
Admission: RE | Disposition: A | Discharge status: Home / Self Care | Payer: Self-pay | Source: Home / Self Care | Attending: Urology

## 2019-07-06 ENCOUNTER — Other Ambulatory Visit: Payer: Self-pay

## 2019-07-06 ENCOUNTER — Ambulatory Visit (HOSPITAL_COMMUNITY): Payer: PPO | Admitting: Certified Registered Nurse Anesthetist

## 2019-07-06 ENCOUNTER — Encounter (HOSPITAL_COMMUNITY): Payer: Self-pay | Admitting: *Deleted

## 2019-07-06 DIAGNOSIS — E785 Hyperlipidemia, unspecified: Secondary | ICD-10-CM | POA: Diagnosis not present

## 2019-07-06 DIAGNOSIS — R310 Gross hematuria: Secondary | ICD-10-CM | POA: Diagnosis present

## 2019-07-06 DIAGNOSIS — Z880 Allergy status to penicillin: Secondary | ICD-10-CM

## 2019-07-06 DIAGNOSIS — C7911 Secondary malignant neoplasm of bladder: Secondary | ICD-10-CM | POA: Diagnosis present

## 2019-07-06 DIAGNOSIS — R430 Anosmia: Secondary | ICD-10-CM | POA: Diagnosis not present

## 2019-07-06 DIAGNOSIS — Z882 Allergy status to sulfonamides status: Secondary | ICD-10-CM

## 2019-07-06 DIAGNOSIS — C61 Malignant neoplasm of prostate: Principal | ICD-10-CM | POA: Diagnosis present

## 2019-07-06 DIAGNOSIS — N138 Other obstructive and reflux uropathy: Secondary | ICD-10-CM | POA: Diagnosis present

## 2019-07-06 DIAGNOSIS — C679 Malignant neoplasm of bladder, unspecified: Secondary | ICD-10-CM | POA: Diagnosis present

## 2019-07-06 DIAGNOSIS — Z885 Allergy status to narcotic agent status: Secondary | ICD-10-CM | POA: Diagnosis not present

## 2019-07-06 DIAGNOSIS — N3941 Urge incontinence: Secondary | ICD-10-CM | POA: Diagnosis present

## 2019-07-06 DIAGNOSIS — N1339 Other hydronephrosis: Secondary | ICD-10-CM | POA: Diagnosis present

## 2019-07-06 DIAGNOSIS — N1330 Unspecified hydronephrosis: Secondary | ICD-10-CM | POA: Diagnosis not present

## 2019-07-06 DIAGNOSIS — D494 Neoplasm of unspecified behavior of bladder: Secondary | ICD-10-CM | POA: Diagnosis not present

## 2019-07-06 DIAGNOSIS — N130 Hydronephrosis with ureteropelvic junction obstruction: Secondary | ICD-10-CM | POA: Diagnosis not present

## 2019-07-06 HISTORY — PX: TRANSURETHRAL RESECTION OF BLADDER TUMOR: SHX2575

## 2019-07-06 HISTORY — PX: CYSTOSCOPY W/ RETROGRADES: SHX1426

## 2019-07-06 LAB — CBC
HCT: 35.2 % — ABNORMAL LOW (ref 39.0–52.0)
Hemoglobin: 11.7 g/dL — ABNORMAL LOW (ref 13.0–17.0)
MCH: 31.5 pg (ref 26.0–34.0)
MCHC: 33.2 g/dL (ref 30.0–36.0)
MCV: 94.9 fL (ref 80.0–100.0)
Platelets: 229 10*3/uL (ref 150–400)
RBC: 3.71 MIL/uL — ABNORMAL LOW (ref 4.22–5.81)
RDW: 11.6 % (ref 11.5–15.5)
WBC: 9.2 10*3/uL (ref 4.0–10.5)
nRBC: 0 % (ref 0.0–0.2)

## 2019-07-06 LAB — BASIC METABOLIC PANEL
Anion gap: 12 (ref 5–15)
BUN: 28 mg/dL — ABNORMAL HIGH (ref 8–23)
CO2: 21 mmol/L — ABNORMAL LOW (ref 22–32)
Calcium: 9.1 mg/dL (ref 8.9–10.3)
Chloride: 108 mmol/L (ref 98–111)
Creatinine, Ser: 2.18 mg/dL — ABNORMAL HIGH (ref 0.61–1.24)
GFR calc Af Amer: 35 mL/min — ABNORMAL LOW (ref >60)
GFR calc non Af Amer: 30 mL/min — ABNORMAL LOW (ref >60)
Glucose, Bld: 137 mg/dL — ABNORMAL HIGH (ref 70–99)
Potassium: 4 mmol/L (ref 3.5–5.1)
Sodium: 141 mmol/L (ref 135–145)

## 2019-07-06 SURGERY — TURBT (TRANSURETHRAL RESECTION OF BLADDER TUMOR)
Anesthesia: General | Site: Ureter

## 2019-07-06 MED ORDER — MIDAZOLAM HCL 5 MG/5ML IJ SOLN
INTRAMUSCULAR | Status: DC | PRN
  Administered 2019-07-06: 2 mg via INTRAVENOUS

## 2019-07-06 MED ORDER — SODIUM CHLORIDE 0.9 % IV SOLN
INTRAVENOUS | Status: DC | PRN
  Administered 2019-07-06: 12:00:00 45 mL

## 2019-07-06 MED ORDER — SODIUM CHLORIDE 0.9 % IR SOLN
Status: DC | PRN
  Administered 2019-07-06: 24000 mL via INTRAVESICAL
  Administered 2019-07-06: 15000 mL via INTRAVESICAL

## 2019-07-06 MED ORDER — FENTANYL CITRATE (PF) 100 MCG/2ML IJ SOLN
25.0000 ug | INTRAMUSCULAR | Status: DC | PRN
  Administered 2019-07-06 (×3): 50 ug via INTRAVENOUS

## 2019-07-06 MED ORDER — ONDANSETRON HCL 4 MG/2ML IJ SOLN
INTRAMUSCULAR | Status: AC
  Filled 2019-07-06: qty 2

## 2019-07-06 MED ORDER — LIDOCAINE 2% (20 MG/ML) 5 ML SYRINGE
INTRAMUSCULAR | Status: DC | PRN
  Administered 2019-07-06: 60 mg via INTRAVENOUS

## 2019-07-06 MED ORDER — CIPROFLOXACIN IN D5W 400 MG/200ML IV SOLN
INTRAVENOUS | Status: AC
  Filled 2019-07-06: qty 200

## 2019-07-06 MED ORDER — EPHEDRINE SULFATE-NACL 50-0.9 MG/10ML-% IV SOSY
PREFILLED_SYRINGE | INTRAVENOUS | Status: DC | PRN
  Administered 2019-07-06: 10 mg via INTRAVENOUS
  Administered 2019-07-06 (×2): 5 mg via INTRAVENOUS

## 2019-07-06 MED ORDER — EPHEDRINE 5 MG/ML INJ
INTRAVENOUS | Status: AC
  Filled 2019-07-06: qty 10

## 2019-07-06 MED ORDER — BELLADONNA ALKALOIDS-OPIUM 16.2-60 MG RE SUPP: 1.0000 | Freq: Three times a day (TID) | RECTAL | Status: DC | PRN

## 2019-07-06 MED ORDER — FENTANYL CITRATE (PF) 100 MCG/2ML IJ SOLN
INTRAMUSCULAR | Status: AC
  Filled 2019-07-06: qty 2

## 2019-07-06 MED ORDER — FENTANYL CITRATE (PF) 100 MCG/2ML IJ SOLN
INTRAMUSCULAR | Status: DC | PRN
  Administered 2019-07-06 (×6): 50 ug via INTRAVENOUS

## 2019-07-06 MED ORDER — ONDANSETRON HCL 4 MG/2ML IJ SOLN
INTRAMUSCULAR | Status: DC | PRN
  Administered 2019-07-06: 4 mg via INTRAVENOUS

## 2019-07-06 MED ORDER — SODIUM CHLORIDE 0.9 % IR SOLN
3000.0000 mL | Status: DC
  Administered 2019-07-06 (×2): 3000 mL

## 2019-07-06 MED ORDER — PROPOFOL 10 MG/ML IV BOLUS
INTRAVENOUS | Status: DC | PRN
  Administered 2019-07-06: 150 mg via INTRAVENOUS

## 2019-07-06 MED ORDER — HYDROMORPHONE HCL 1 MG/ML IJ SOLN: 0.5000 mg | INTRAMUSCULAR | Status: DC | PRN

## 2019-07-06 MED ORDER — PROPOFOL 10 MG/ML IV BOLUS
INTRAVENOUS | Status: AC
  Filled 2019-07-06: qty 20

## 2019-07-06 MED ORDER — ZOLPIDEM TARTRATE 5 MG PO TABS
5.0000 mg | ORAL_TABLET | Freq: Every evening | ORAL | Status: DC | PRN
  Administered 2019-07-06: 5 mg via ORAL
  Filled 2019-07-06: qty 1

## 2019-07-06 MED ORDER — ACETAMINOPHEN 10 MG/ML IV SOLN
1000.0000 mg | Freq: Four times a day (QID) | INTRAVENOUS | Status: DC
  Administered 2019-07-06 – 2019-07-07 (×3): 1000 mg via INTRAVENOUS
  Filled 2019-07-06 (×4): qty 100

## 2019-07-06 MED ORDER — ROCURONIUM BROMIDE 50 MG/5ML IV SOSY
PREFILLED_SYRINGE | INTRAVENOUS | Status: DC | PRN
  Administered 2019-07-06: 20 mg via INTRAVENOUS
  Administered 2019-07-06: 50 mg via INTRAVENOUS
  Administered 2019-07-06: 20 mg via INTRAVENOUS
  Administered 2019-07-06: 10 mg via INTRAVENOUS

## 2019-07-06 MED ORDER — DEXAMETHASONE SODIUM PHOSPHATE 10 MG/ML IJ SOLN
INTRAMUSCULAR | Status: AC
  Filled 2019-07-06: qty 1

## 2019-07-06 MED ORDER — TRAMADOL HCL 50 MG PO TABS
50.0000 mg | ORAL_TABLET | Freq: Four times a day (QID) | ORAL | Status: DC | PRN
  Administered 2019-07-06: 100 mg via ORAL
  Filled 2019-07-06: qty 2

## 2019-07-06 MED ORDER — CIPROFLOXACIN IN D5W 400 MG/200ML IV SOLN
400.0000 mg | Freq: Once | INTRAVENOUS | Status: AC
  Administered 2019-07-06: 400 mg via INTRAVENOUS

## 2019-07-06 MED ORDER — SUGAMMADEX SODIUM 200 MG/2ML IV SOLN
INTRAVENOUS | Status: DC | PRN
  Administered 2019-07-06: 200 mg via INTRAVENOUS

## 2019-07-06 MED ORDER — SUCCINYLCHOLINE CHLORIDE 200 MG/10ML IV SOSY
PREFILLED_SYRINGE | INTRAVENOUS | Status: DC | PRN
  Administered 2019-07-06: 80 mg via INTRAVENOUS

## 2019-07-06 MED ORDER — MIDAZOLAM HCL 2 MG/2ML IJ SOLN
INTRAMUSCULAR | Status: AC
  Filled 2019-07-06: qty 2

## 2019-07-06 MED ORDER — SODIUM CHLORIDE 0.9 % IR SOLN
3000.0000 mL | Status: DC
  Administered 2019-07-06: 3000 mL

## 2019-07-06 MED ORDER — TRAMADOL HCL 50 MG PO TABS: 50.0000 mg | ORAL_TABLET | Freq: Four times a day (QID) | ORAL | 0 refills | Status: DC | PRN

## 2019-07-06 MED ORDER — BELLADONNA ALKALOIDS-OPIUM 16.2-30 MG RE SUPP
RECTAL | Status: AC
  Filled 2019-07-06: qty 1

## 2019-07-06 MED ORDER — DEXAMETHASONE SODIUM PHOSPHATE 10 MG/ML IJ SOLN
INTRAMUSCULAR | Status: DC | PRN
  Administered 2019-07-06: 10 mg via INTRAVENOUS

## 2019-07-06 MED ORDER — LACTATED RINGERS IV SOLN
INTRAVENOUS | Status: DC
  Administered 2019-07-06: 1000 mL via INTRAVENOUS
  Administered 2019-07-06: 08:00:00 via INTRAVENOUS

## 2019-07-06 MED ORDER — ONDANSETRON HCL 4 MG/2ML IJ SOLN: 4.0000 mg | Freq: Four times a day (QID) | INTRAMUSCULAR | Status: DC | PRN

## 2019-07-06 MED ORDER — 0.9 % SODIUM CHLORIDE (POUR BTL) OPTIME
TOPICAL | Status: DC | PRN
  Administered 2019-07-06: 10:00:00 1000 mL

## 2019-07-06 MED ORDER — ONDANSETRON HCL 4 MG/2ML IJ SOLN: 4.0000 mg | Freq: Once | INTRAMUSCULAR | Status: DC | PRN

## 2019-07-06 MED ORDER — LIDOCAINE HCL URETHRAL/MUCOSAL 2 % EX GEL
CUTANEOUS | Status: AC
  Filled 2019-07-06: qty 5

## 2019-07-06 MED ORDER — LIDOCAINE 2% (20 MG/ML) 5 ML SYRINGE
INTRAMUSCULAR | Status: AC
  Filled 2019-07-06: qty 5

## 2019-07-06 SURGICAL SUPPLY — 35 items
BAG URINE DRAINAGE (UROLOGICAL SUPPLIES) ×4 IMPLANT
BAG URO CATCHER STRL LF (MISCELLANEOUS) ×4 IMPLANT
BASKET ZERO TIP NITINOL 2.4FR (BASKET) IMPLANT
CATH FOLEY 2WAY SLVR 30CC 24FR (CATHETERS) ×4 IMPLANT
CATH FOLEY 3WAY 30CC 22FR (CATHETERS) IMPLANT
CATH FOLEY 3WAY 30CC 24FR (CATHETERS) ×2
CATH URET 5FR 28IN OPEN ENDED (CATHETERS) ×4 IMPLANT
CATH URTH STD 24FR FL 3W 2 (CATHETERS) ×2 IMPLANT
CLOTH BEACON ORANGE TIMEOUT ST (SAFETY) ×4 IMPLANT
COVER WAND RF STERILE (DRAPES) IMPLANT
ELECT REM PT RETURN 15FT ADLT (MISCELLANEOUS) ×4 IMPLANT
EXTRACTOR STONE 1.7FRX115CM (UROLOGICAL SUPPLIES) IMPLANT
GLOVE BIO SURGEON STRL SZ7.5 (GLOVE) ×4 IMPLANT
GLOVE BIOGEL M STRL SZ7.5 (GLOVE) ×4 IMPLANT
GOWN STRL REUS W/ TWL XL LVL3 (GOWN DISPOSABLE) ×2 IMPLANT
GOWN STRL REUS W/TWL XL LVL3 (GOWN DISPOSABLE) ×6 IMPLANT
GUIDEWIRE ANG ZIPWIRE 038X150 (WIRE) IMPLANT
GUIDEWIRE STR DUAL SENSOR (WIRE) ×4 IMPLANT
HOLDER FOLEY CATH W/STRAP (MISCELLANEOUS) ×4 IMPLANT
KIT TURNOVER KIT A (KITS) IMPLANT
LOOP CUT BIPOLAR 24F LRG (ELECTROSURGICAL) ×4 IMPLANT
MANIFOLD NEPTUNE II (INSTRUMENTS) ×4 IMPLANT
NDL SAFETY ECLIPSE 18X1.5 (NEEDLE) ×2 IMPLANT
NEEDLE HYPO 18GX1.5 SHARP (NEEDLE) ×2
PACK CYSTO (CUSTOM PROCEDURE TRAY) ×4 IMPLANT
SET ASPIRATION TUBING (TUBING) IMPLANT
SHEATH URETERAL 12FRX28CM (UROLOGICAL SUPPLIES) IMPLANT
SHEATH URETERAL 12FRX35CM (MISCELLANEOUS) IMPLANT
STENT URET 6FRX26 CONTOUR (STENTS) ×8 IMPLANT
SYR 30ML LL (SYRINGE) ×4 IMPLANT
SYRINGE IRR TOOMEY STRL 70CC (SYRINGE) ×4 IMPLANT
TUBING CONNECTING 10 (TUBING) ×3 IMPLANT
TUBING CONNECTING 10' (TUBING) ×1
TUBING UROLOGY SET (TUBING) ×4 IMPLANT
WIRE COONS/BENSON .038X145CM (WIRE) IMPLANT

## 2019-07-06 NOTE — Plan of Care (Signed)
Discussed plan of care regarding procedure and CBI.

## 2019-07-06 NOTE — Anesthesia Procedure Notes (Signed)
Procedure Name: Intubation Date/Time: 07/06/2019 9:34 AM Performed by: Maxwell Caul, CRNA Pre-anesthesia Checklist: Patient identified, Emergency Drugs available, Suction available and Patient being monitored Patient Re-evaluated:Patient Re-evaluated prior to induction Oxygen Delivery Method: Circle system utilized Preoxygenation: Pre-oxygenation with 100% oxygen Induction Type: IV induction Laryngoscope Size: Mac and 4 Grade View: Grade I Tube type: Oral Tube size: 7.5 mm Number of attempts: 1 Airway Equipment and Method: Stylet Placement Confirmation: ETT inserted through vocal cords under direct vision,  positive ETCO2 and breath sounds checked- equal and bilateral Secured at: 21 cm Tube secured with: Tape Dental Injury: Teeth and Oropharynx as per pre-operative assessment

## 2019-07-06 NOTE — Anesthesia Preprocedure Evaluation (Signed)
Anesthesia Evaluation  Patient identified by MRN, date of birth, ID band Patient awake    Reviewed: Allergy & Precautions, NPO status , Patient's Chart, lab work & pertinent test results  History of Anesthesia Complications Negative for: history of anesthetic complications  Airway Mallampati: I  TM Distance: >3 FB Neck ROM: Full    Dental  (+) Teeth Intact   Pulmonary neg pulmonary ROS,    Pulmonary exam normal        Cardiovascular negative cardio ROS Normal cardiovascular exam     Neuro/Psych negative neurological ROS  negative psych ROS   GI/Hepatic negative GI ROS, Neg liver ROS,   Endo/Other  negative endocrine ROS  Renal/GU negative Renal ROS  negative genitourinary   Musculoskeletal negative musculoskeletal ROS (+)   Abdominal   Peds  Hematology negative hematology ROS (+)   Anesthesia Other Findings   Reproductive/Obstetrics                            Anesthesia Physical Anesthesia Plan  ASA: II  Anesthesia Plan: General   Post-op Pain Management:    Induction: Intravenous  PONV Risk Score and Plan: 2 and Ondansetron, Dexamethasone, Midazolam and Treatment may vary due to age or medical condition  Airway Management Planned: Oral ETT  Additional Equipment: None  Intra-op Plan:   Post-operative Plan: Extubation in OR  Informed Consent: I have reviewed the patients History and Physical, chart, labs and discussed the procedure including the risks, benefits and alternatives for the proposed anesthesia with the patient or authorized representative who has indicated his/her understanding and acceptance.     Dental advisory given  Plan Discussed with:   Anesthesia Plan Comments:        Anesthesia Quick Evaluation

## 2019-07-06 NOTE — Transfer of Care (Signed)
Immediate Anesthesia Transfer of Care Note  Patient: Shane [name]  Procedure(s) Performed: TRANSURETHRAL RESECTION OF BLADDER TUMOR (TURBT) (N/A ) CYSTOSCOPY WITH RETROGRADE PYELOGRAM, BILATERAL STENT PLACEMENT (Bilateral Ureter)  Patient Location: PACU  Anesthesia Type:General  Level of Consciousness: awake, alert  and oriented  Airway & Oxygen Therapy: Patient Spontanous Breathing and Patient connected to face mask oxygen  Post-op Assessment: Report given to RN and Post -op Vital signs reviewed and stable  Post vital signs: Reviewed and stable  Last Vitals:  Vitals Value Taken Time  BP 143/75 07/06/19 1157  Temp    Pulse 66 07/06/19 1159  Resp 10 07/06/19 1159  SpO2 100 % 07/06/19 1159  Vitals shown include unvalidated device data.  Last Pain:  Vitals:   07/06/19 0715  TempSrc: Oral      Patients Stated Pain Goal: 4 (12/09/02 5913)  Complications: No apparent anesthesia complications

## 2019-07-06 NOTE — H&P (Signed)
68 year old male who presented to me with pelvic floor pain. We treated him for chronic prostatitis with meloxicam in tamsulosin which was largely unhelpful. He subsequently went to pelvic floor physical therapy which did improve his symptoms quite significantly.   Interval: Since the patient was last seen he has had persistent her urine slightly worsening dysuria, urinary urgency, and incontinence. He has had also an episode of gross hematuria. This is subsequently stopped. He continues to have some microscopic hematuria. Denies any fevers or chills. He is not having any associated pain, but he does feel constant discomfort.     ALLERGIES: azithromycin - Skin Rash Oxycodone-Acetaminophen penicillin - Swelling, rash Sulfamethoxazole-Trimethoprim - Hives, rash    MEDICATIONS: Tamsulosin Hcl 0.4 mg capsule 1 capsule PO Daily     GU PSH: None   NON-GU PSH: None   GU PMH: Pelvic/perineal pain - 02/12/2019 Acute prostatitis - 02/07/2019 Chronic prostatitis - 12/19/2018    NON-GU PMH: Other specified disorders of muscle - 02/20/2019 Muscle weakness (generalized) - 02/12/2019 Other muscle spasm - 02/12/2019    FAMILY HISTORY: 2 sons - Son hemochromatosis - Son peritoneal cancer - Mother    Notes: antitrypsin disease- father   SOCIAL HISTORY: Marital Status: Married Preferred Language: English; Ethnicity: Not Hispanic Or Latino; Race: White Current Smoking Status: Patient has never smoked.   Tobacco Use Assessment Completed: Used Tobacco in last 30 days? Does drink.  Drinks 1 caffeinated drink per day.    REVIEW OF SYSTEMS:    GU Review Male:   Patient reports frequent urination, hard to postpone urination, burning/ pain with urination, get up at night to urinate, and stream starts and stops. Patient denies leakage of urine, trouble starting your stream, have to strain to urinate , erection problems, and penile pain.  Gastrointestinal (Upper):   Patient denies nausea, vomiting, and  indigestion/ heartburn.  Gastrointestinal (Lower):   Patient denies diarrhea and constipation.  Constitutional:   Patient denies weight loss, night sweats, fever, and fatigue.  Skin:   Patient denies skin rash/ lesion and itching.  Eyes:   Patient denies blurred vision and double vision.  Ears/ Nose/ Throat:   Patient denies sore throat and sinus problems.  Hematologic/Lymphatic:   Patient denies swollen glands and easy bruising.  Cardiovascular:   Patient denies leg swelling and chest pains.  Respiratory:   Patient denies cough and shortness of breath.  Endocrine:   Patient denies excessive thirst.  Musculoskeletal:   Patient denies back pain and joint pain.  Neurological:   Patient denies headaches and dizziness.  Psychologic:   Patient denies depression and anxiety.   Notes: Patient c/o having blood clots during urinating. Pt is having nocturia at least 4-7x    VITAL SIGNS:      06/26/2019 08:25 AM  Weight 185 lb / 83.91 kg  Height 72 in / 182.88 cm  BP 157/71 mmHg  Pulse 60 /min  Temperature 97.8 F / 36.5 C  BMI 25.1 kg/m   GU PHYSICAL EXAMINATION:    Scrotum: No lesions. No edema. No cysts. No warts.  Epididymides: Right: no spermatocele, no masses, no cysts, no tenderness, no induration, no enlargement. Left: no spermatocele, no masses, no cysts, no tenderness, no induration, no enlargement.  Testes: No tenderness, no swelling, no enlargement left testes. No tenderness, no swelling, no enlargement right testes. Normal location left testes. Normal location right testes. No mass, no cyst, no varicocele, no hydrocele left testes. No mass, no cyst, no varicocele, no hydrocele right testes.  Urethral Meatus: Normal size. No lesion, no wart, no discharge, no polyp. Normal location.  Penis: Circumcised, no warts, no cracks. No dorsal Peyronie's plaques, no left corporal Peyronie's plaques, no right corporal Peyronie's plaques, no scarring, no warts. No balanitis, no meatal stenosis.    MULTI-SYSTEM PHYSICAL EXAMINATION:    Constitutional: Well-nourished. No physical deformities. Normally developed. Good grooming.  Respiratory: Normal breath sounds. No labored breathing, no use of accessory muscles.   Cardiovascular: Regular rate and rhythm. No murmur, no gallop. Normal temperature, normal extremity pulses, no swelling, no varicosities.      PAST DATA REVIEWED:  Source Of History:  Patient  Records Review:   Previous Doctor Records, Previous Patient Records, POC Tool   PROCEDURES:         Flexible Cystoscopy - 52000  Risks, benefits, and some of the potential complications of the procedure were discussed at length with the patient including infection, bleeding, voiding discomfort, urinary retention, fever, chills, sepsis, and others. All questions were answered. Informed consent was obtained. Sterile technique and intraurethral analgesia were used.  Meatus:  Normal size. Normal location. Normal condition.  Urethra:  No strictures.  External Sphincter:  Normal.  Verumontanum:  Normal.  Prostate:  Obstructing. No hyperplasia. Appears to be a tumor on the anterior wall of the prostate consistent with transitional cell  Bladder Neck:  Non-obstructing.  Ureteral Orifices:  Normal location. Normal size. Normal shape. Effluxed clear urine.  Bladder:  Present least 1 bladder tumor on the posterior/bladder dome measuring approximately 1.5 cm. There is also a tumor at the trigone that the similar size.      The lower urinary tract was carefully examined. The procedure was well-tolerated and without complications. Antibiotic instructions were given. Instructions were given to call the office immediately for bloody urine, difficulty urinating, urinary retention, painful or frequent urination, fever, chills, nausea, vomiting or other illness. The patient stated that he understood these instructions and would comply with them.        Simple Foley Catheterization - [id number]  A 20 French  coude Foley catheter was inserted into the bladder using sterile technique. The patient was taught routine catheter care. A leg bag was connected. 380 cc of urine was obtained.         Urinalysis w/Scope Dipstick Dipstick Cont'd Micro  Color: Amber Bilirubin: Neg mg/dL WBC/hpf: 0 - 5/hpf  Appearance: Cloudy Ketones: Neg mg/dL RBC/hpf: >60/hpf  Specific Gravity: 1.025 Blood: 3+ ery/uL Bacteria: NS (Not Seen)  pH: <=5.0 Protein: 1+ mg/dL Cystals: NS (Not Seen)  Glucose: Neg mg/dL Urobilinogen: 0.2 mg/dL Casts: NS (Not Seen)    Nitrites: Neg Trichomonas: Not Present    Leukocyte Esterase: Neg leu/uL Mucous: Not Present      Epithelial Cells: 0 - 5/hpf      Yeast: NS (Not Seen)      Sperm: Not Present         Notes:   Patient returned back to clinic this afternoon with pain and unable to void. Patient states he drank 10-11 glasses of water and only void a small amount. Catheter inserted by Dr. [name] with minimal blood. Patient will return back to the office in the next 3 days for a voiding trial. -AWL, CMA   ASSESSMENT:      ICD-10 Details  1 GU:   Chronic prostatitis - N41.1   2   Gross hematuria - R31.0   3   Bladder Cancer overlapping sites - C67.8    PLAN:  Orders Labs BMP          Schedule X-Rays: 1 Week - C.T. Hematuria With and Without I.V. Contrast  Return Visit/Planned Activity: 1 Week - Office Visit             Note: Okay to schedule immediately following his CT scan, on the same day.          Document Letter(s):  Created for Patient: Clinical Summary         Notes:   The patient had 2 tumors in his bladder and what appeared to be 1 emanating from the anterior wall of the prostate. I went through the diagnosis with the patient recommended a TURBT/TURP. This type surgery would require him to spend the night in the hospital 1 night given the involvement of his prostate. I went through the surgery with him in detail. I informed him of my suspicion that he had  bladder cancer and we discussed the pathophysiology in general terms. Our plan is to have the patient scheduled for a surgery as mentioned and then have him return for further discussion. He does need a CT scan, this can happen after the surgery if the timing is as such.

## 2019-07-06 NOTE — Discharge Instructions (Signed)
Transurethral Resection of Bladder Tumor (TURBT) or Bladder Biopsy   Definition:  Transurethral Resection of the Bladder Tumor is a surgical procedure used to diagnose and remove tumors within the bladder. TURBT is the most common treatment for early stage bladder cancer.  General instructions:     Your recent bladder surgery requires very little post hospital care but some definite precautions.  Despite the fact that no skin incisions were used, the area around the bladder incisions are raw and covered with scabs to promote healing and prevent bleeding. Certain precautions are needed to insure that the scabs are not disturbed over the next 2-4 weeks while the healing proceeds.  Because the raw surface inside your bladder and the irritating effects of urine you may expect frequency of urination and/or urgency (a stronger desire to urinate) and perhaps even getting up at night more often. This will usually resolve or improve slowly over the healing period. You may see some blood in your urine over the first 6 weeks. Do not be alarmed, even if the urine was clear for a while. Get off your feet and drink lots of fluids until clearing occurs. If you start to pass clots or don't improve call us.  Diet:  You may return to your normal diet immediately. Because of the raw surface of your bladder, alcohol, spicy foods, foods high in acid and drinks with caffeine may cause irritation or frequency and should be used in moderation. To keep your urine flowing freely and avoid constipation, drink plenty of fluids during the day (8-10 glasses). Tip: Avoid cranberry juice because it is very acidic.  Activity:  Your physical activity doesn't need to be restricted. However, if you are very active, you may see some blood in the urine. We suggest that you reduce your activity under the circumstances until the bleeding has stopped.  Bowels:  It is important to keep your bowels regular during the postoperative  period. Straining with bowel movements can cause bleeding. A bowel movement every other day is reasonable. Use a mild laxative if needed, such as milk of magnesia 2-3 tablespoons, or 2 Dulcolax tablets. Call if you continue to have problems. If you had been taking narcotics for pain, before, during or after your surgery, you may be constipated. Take a laxative if necessary.    Medication:  You should resume your pre-surgery medications unless told not to. In addition you may be given an antibiotic to prevent or treat infection. Antibiotics are not always necessary. All medication should be taken as prescribed until the bottles are finished unless you are having an unusual reaction to one of the drugs.    Foley Catheter Care, Adult A soft, flexible tube (Foley catheter) has been placed in your bladder. This may be done to temporarily help with urine drainage after an operation or to relieve blockage from an enlarged prostate gland. HOME CARE INSTRUCTIONS  If you are going home with a Foley catheter in place, follow these instructions: Taking Care of the Catheter:  Keep the area where the catheter leaves your body clean.  Attach the catheter to the leg so there is no tension on the catheter.  Keep the drainage bag below the level of the bladder, but keep it OFF the floor.  Do not take long soaking baths.   Wash your hands before touching ANYTHING related to the catheter or bag.  Using mild soap and warm water on a washcloth:  Clean the area closest to the catheter insertion site  using a circular motion around the catheter.  Clean the catheter itself by wiping AWAY from the insertion site for several inches down the tube.  NEVER wipe upward as this could sweep bacteria up into the urethra (tube in your body that normally drains the bladder) and cause infection. Taking Care of the Drainage Bags:  Two drainage bags will be taken home: a large overnight drainage bag, and a smaller leg  bag which fits underneath clothing.  It is okay to wear the overnight bag at any time, but NEVER wear the smaller leg bag at night.  Keep the drainage bag well below the level of your bladder. This prevents backflow of urine into the bladder and allows the urine to drain freely.  Anchor the tubing to your leg to prevent pulling or tension on the catheter. Use tape or a leg strap provided by the hospital.  Empty the drainage bag when it is  to  full. Wash your hands before and after touching the bag.  Periodically check the tubing for kinks to make sure there is no pressure on the tubing which could restrict the flow of urine. Changing the Drainage Bags:  Cleanse both ends of the clean bag with alcohol before changing.  Pinch off the rubber catheter to avoid urine spillage during the disconnection.  Disconnect the dirty bag and connect the clean one.  Empty the dirty bag carefully to avoid a urine spill.  Attach the new bag to the leg with tape or a leg strap. Cleaning the Drainage Bags:  Whenever a drainage bag is disconnected, it must be cleaned quickly so it is ready for the next use.  Wash the bag in warm, soapy water.  Rinse the bag thoroughly with warm water.  Soak the bag for 30 minutes in a solution of white vinegar and water (1 cup vinegar to 1 quart warm water).  Rinse with warm water. SEEK MEDICAL CARE IF:   Some pain develops in the kidney (lower back) area.  The urine is cloudy or smells bad.  There is some blood in the urine.  The catheter becomes clogged and/or there is no urine drainage. SEEK IMMEDIATE MEDICAL CARE IF:   You have moderate or severe pain in the kidney region.  You start to throw up (vomit).  Blood fills the tube.  Worsening belly (abdominal) pain develops.  You have a fever. MAKE SURE YOU:   Understand these instructions.  Will watch your condition.  Will get help right away if you are not doing well or get worse.  Call  [facility] if you have any questions or concerns: [phone]

## 2019-07-06 NOTE — Op Note (Signed)
Preoperative diagnosis:  1. Bladder tumor, trigone/anterior bladder neck and prostate 2. Hydronephrosis   Postoperative diagnosis:  1. same   Procedure: 1. Cystoscopy, bilateral retrograde pyelogram with interpretation, bilateral ureteral stent placement 2. Transurethral resection of bladder tumor, greater than 5 cm, involving the trigone, bladder neck, and prostatic urethra  Surgeon: [name], MD  Anesthesia: General  Complications: None  Intraoperative findings:  #1: The patient had a very large broad-based lesion at the trigonal region of the bladder extending into the bladder neck.  There was also tumor that appeared to be contiguous growing anteriorly at the bladder neck and into the anterior prostatic urethra.  The tumor was covering up both ureteral orifice ease, and was high-grade in nature.  The resection was down into the muscle. #2: Once the right ureteral orifice was uncovered I was able to advance a 5 [place] open-ended ureteral catheter into the proximal ureter.  I injected 15 cc of Omnipaque contrast in total.  This demonstrated a broadly dilated ureter all the way down to the UVJ.  There were no filling defects.  The calyces were blunted in the renal pelvis was hydronephrotic. #3: The left ureteral orifice was also uncovered in a similar manner.  I was able to cannulate the left ureteral orifice with a 5 [place] open-ended ureteral catheter and performed retrograde pyelogram.  This demonstrated obstruction down to the UVJ with severe hydroureteronephrosis with a tortuous ureter and blunted calyces.  There were no filling defects.  Drains: 26 cm time 6 French double-J ureteral stents were placed bilaterally  EBL: 150 cc  Specimens: Bladder tumor: Trigone/bladder neck/prostatic urethra  Indication: Shane [name] is a 68 y.o. patient with severe voiding symptoms including urgency and urge incontinence.  He also was noted to have gross hematuria.  In the office  cystoscopy was performed demonstrating prosthetic urethral tumor as well as a bladder tumor at the bladder neck..  After reviewing the management options for treatment, he elected to proceed with the above surgical procedure(s). We have discussed the potential benefits and risks of the procedure, side effects of the proposed treatment, the likelihood of the patient achieving the goals of the procedure, and any potential problems that might occur during the procedure or recuperation. Informed consent has been obtained.  Description of procedure:  The patient was taken to the operating room and general anesthesia was induced.  The patient was placed in the dorsal lithotomy position, prepped and draped in the usual sterile fashion, and preoperative antibiotics were administered. A preoperative time-out was performed.   A 21 French 30 degrees cystoscope was gently passed through the patient's urethra and the bladder under visual guidance.  Cystoscopy commenced with the above findings.  I then exchanged the 21 French sheath for the 26 French resectoscope sheath using the visual obturator and passed it and under visual guidance.  I then exchanged the obturator for the loop element and performed the above TURBT.  Once I was able to uncover the patient's right ureteral orifice I performed a right retrograde pyelogram with the above findings.  I then advanced a sensor wire up through the ureter and placed a 26 cm time 6 French double-J stent in the routine fashion under fluoroscopic guidance.  I then re-passed the loop resection element and proceeded to resect of the patient's left trigonal region and bladder neck.  Once the left ureteral orifice was identified I was able to perform a retrograde pyelogram as described above.  I then was able to  advance a 26 cm time 6 French double-J stent over the wire under fluoroscopic guidance.  The resection element was then replaced to the patient's bladder and anterior aspect of  the tumor at the bladder neck and prostatic urethra was resected.  I then achieved hemostasis and evacuated all the bladder specimen.  I placed a B&O suppository in the patient's rectum and a bolus of lidocaine jelly into his urethra.  I then advanced a 69 French three-way Foley catheter.  The patient was subsequently awoken and returned to the PACU in stable condition.  [name], M.D.

## 2019-07-06 NOTE — Interval H&P Note (Signed)
History and Physical Interval Note:  07/06/2019 9:13 AM  Shane [name]  has presented today for surgery, with the diagnosis of BLADDER TUMORS.  The various methods of treatment have been discussed with the patient and family. After consideration of risks, benefits and other options for treatment, the patient has consented to  Procedure(s): TRANSURETHRAL RESECTION OF BLADDER TUMOR (TURBT) (N/A) TRANSURETHRAL RESECTION OF THE PROSTATE (TURP) (N/A) CYSTOSCOPY WITH RETROGRADE PYELOGRAM (Bilateral) as a surgical intervention.  The patient's history has been reviewed, patient examined, no change in status, stable for surgery.  I have reviewed the patient's chart and labs.  Questions were answered to the patient's satisfaction.     [name]

## 2019-07-06 NOTE — Interval H&P Note (Signed)
History and Physical Interval Note:  07/06/2019 9:14 AM  Shane [name]  has presented today for surgery, with the diagnosis of BLADDER TUMORS.  The various methods of treatment have been discussed with the patient and family. After consideration of risks, benefits and other options for treatment, the patient has consented to  Procedure(s): TRANSURETHRAL RESECTION OF BLADDER TUMOR (TURBT) (N/A) TRANSURETHRAL RESECTION OF THE PROSTATE (TURP) (N/A) CYSTOSCOPY WITH RETROGRADE PYELOGRAM (Bilateral) as a surgical intervention.  The patient's history has been reviewed, patient examined, no change in status, stable for surgery.  I have reviewed the patient's chart and labs.  Questions were answered to the patient's satisfaction.     [name]

## 2019-07-06 NOTE — Anesthesia Postprocedure Evaluation (Signed)
Anesthesia Post Note  Patient: Shane [name]  Procedure(s) Performed: TRANSURETHRAL RESECTION OF BLADDER TUMOR (TURBT) (N/A ) CYSTOSCOPY WITH RETROGRADE PYELOGRAM, BILATERAL STENT PLACEMENT (Bilateral Ureter)     Patient location during evaluation: PACU Anesthesia Type: General Level of consciousness: awake and alert Pain management: pain level controlled Vital Signs Assessment: post-procedure vital signs reviewed and stable Respiratory status: spontaneous breathing, nonlabored ventilation, respiratory function stable and patient connected to nasal cannula oxygen Cardiovascular status: blood pressure returned to baseline and stable Postop Assessment: no apparent nausea or vomiting Anesthetic complications: no    Last Vitals:  Vitals:   07/06/19 1300 07/06/19 1315  BP: (!) 151/81 (!) 162/83  Pulse: 61 61  Resp: 15 13  Temp:  36.7 C  SpO2: 100% 100%    Last Pain:  Vitals:   07/06/19 1500  TempSrc:   PainSc: 0-No pain                 Lidia Collum

## 2019-07-07 ENCOUNTER — Encounter (HOSPITAL_COMMUNITY): Payer: Self-pay | Admitting: Urology

## 2019-07-07 NOTE — Plan of Care (Addendum)
Pt cautioned to eat light meals and ambulate in order to avoid gastro complications post Sx. Pt ambulated in hallway this shift.

## 2019-07-07 NOTE — Progress Notes (Signed)
Patient is stable for discharge. Discharge instructions and medications have been reviewed with the patient and all questions answered. AVS and prescriptions given to patient. Provided patient with large foley bag and leg bag supplies and foley care teaching. Patient demonstrates understanding.   Shane [name], Fraser Din 07/07/2019

## 2019-07-07 NOTE — Discharge Summary (Signed)
Physician Discharge Summary  Patient ID: Shane [name] MRN: [id number] DOB/AGE: [birth year] 68 y.o.  Admit date: 07/06/2019 Discharge date: 07/07/2019  Admission Diagnoses:  Discharge Diagnoses:  Active Problems:   Bladder cancer [facility])   Discharged Condition: good  Hospital Course: Patient underwent a bilateral ureteral stent placement and transurethral resection of bladder tumor--large.  He remained overnight for observation.  CBI was weaned off.  Urine was light pink off CBI.  He was stable for discharge.  Consults: None  Significant Diagnostic Studies: None  Treatments: surgery: As above  Discharge Exam: Blood pressure 131/68, pulse 60, temperature 98.2 F (36.8 C), temperature source Oral, resp. rate 14, height 5\' 11"  (1.803 m), weight 84.8 kg, SpO2 99 %. General appearance: alert no acute distress Adequate perfusion of extremities Nonlabored respiration Abdomen soft, nontender, nondistended Three-way Foley catheter in place draining light pink urine off CBI.  Disposition: Discharge disposition: 01-Home or Self Care        Allergies as of 07/07/2019      Reactions   Bactrim [sulfamethoxazole-trimethoprim] Hives   Red rash, hives   Percocet [oxycodone-acetaminophen] Other (See Comments)   TURNS RED FROM CHEST UP, LOOKS LIKE SUNBURN AND STARTS TO ITCH BADLY   Penicillins Swelling, Rash   Did it involve swelling of the face/tongue/throat, SOB, or low BP? No Did it involve sudden or severe rash/hives, skin peeling, or any reaction on the inside of your mouth or nose? No Did you need to seek medical attention at a hospital or doctor's office? No When did it last happen?50 years ago If all above answers are "NO", may proceed with cephalosporin use.   Zithromax [azithromycin] Rash      Medication List    TAKE these medications   [facility] Mouthwash Susp Use as directed 5-10 mLs in the mouth or throat 3 (three) times daily.   naproxen sodium 220 MG  tablet Commonly known as: ALEVE Take 220 mg by mouth 2 (two) times daily with a meal.   tamsulosin 0.4 MG Caps capsule Commonly known as: FLOMAX Take 0.4 mg by mouth daily.   traMADol 50 MG tablet Commonly known as: Ultram Take 1-2 tablets (50-100 mg total) by mouth every 6 (six) hours as needed for moderate pain.      Follow-up Information    [name], MD On 07/23/2019.   Specialty: Urology Why: 9:30am Contact information: [place] [address] [phone]        [facility]. Schedule an appointment as soon as possible for a visit on 07/09/2019.   Why: Catheter removal Contact information: [place] [phone]          Signed: Marton Redwood, III 07/07/2019, 11:57 AM

## 2019-07-09 DIAGNOSIS — C678 Malignant neoplasm of overlapping sites of bladder: Secondary | ICD-10-CM | POA: Diagnosis not present

## 2019-07-09 DIAGNOSIS — N130 Hydronephrosis with ureteropelvic junction obstruction: Secondary | ICD-10-CM | POA: Diagnosis not present

## 2019-07-16 DIAGNOSIS — C61 Malignant neoplasm of prostate: Secondary | ICD-10-CM | POA: Diagnosis not present

## 2019-07-17 ENCOUNTER — Other Ambulatory Visit (HOSPITAL_COMMUNITY): Payer: Self-pay | Admitting: Urology

## 2019-07-17 ENCOUNTER — Other Ambulatory Visit: Payer: Self-pay | Admitting: Urology

## 2019-07-17 DIAGNOSIS — C61 Malignant neoplasm of prostate: Secondary | ICD-10-CM

## 2019-07-19 ENCOUNTER — Encounter: Payer: Self-pay | Admitting: Family Medicine

## 2019-07-26 ENCOUNTER — Other Ambulatory Visit: Payer: Self-pay

## 2019-07-26 ENCOUNTER — Encounter (HOSPITAL_COMMUNITY)
Admission: RE | Admit: 2019-07-26 | Discharge: 2019-07-26 | Disposition: A | Discharge status: Home / Self Care | Payer: PPO | Source: Ambulatory Visit | Attending: Urology | Admitting: Urology

## 2019-07-26 ENCOUNTER — Ambulatory Visit (HOSPITAL_COMMUNITY)
Admission: RE | Admit: 2019-07-26 | Discharge: 2019-07-26 | Disposition: A | Discharge status: Home / Self Care | Payer: PPO | Source: Ambulatory Visit | Attending: Urology | Admitting: Urology

## 2019-07-26 DIAGNOSIS — R310 Gross hematuria: Secondary | ICD-10-CM | POA: Diagnosis not present

## 2019-07-26 DIAGNOSIS — C61 Malignant neoplasm of prostate: Secondary | ICD-10-CM | POA: Diagnosis not present

## 2019-07-26 DIAGNOSIS — C7951 Secondary malignant neoplasm of bone: Secondary | ICD-10-CM | POA: Diagnosis not present

## 2019-07-26 IMAGING — NM NUCLEAR MEDICINE WHOLE BODY BONE SCINTIGRAPHY
2 series · 2 of 2 positions shown · non-contrast
Comparison: CT [DATE].

CLINICAL DATA: Recent diagnosis of prostate cancer.  PSA is 2.7.

EXAM:
NUCLEAR MEDICINE WHOLE BODY BONE SCAN
TECHNIQUE: Whole body anterior and posterior images were obtained approximately
3 hours after intravenous injection of radiopharmaceutical.
RADIOPHARMACEUTICALS:  22.0 mCi [XG] MDP IV

[Series 1: wbr_bone_40 whole body · 2.66mm/px · 1 of 1 slices shown (1 of 2)]
[im 1/1]
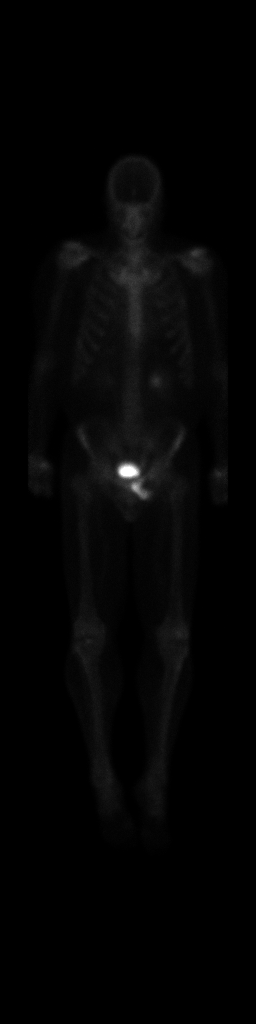

[Series 1: wbr_bone_40 whole body · 2.66mm/px · 1 of 1 slices shown (2 of 2)]
[im 1/1]
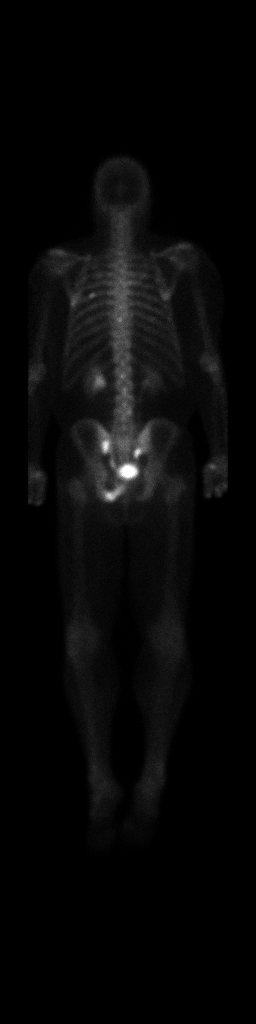

[2 of 2 positions shown; findings below may reference images not displayed]

FINDINGS: Abnormal asymmetric increased radiotracer uptake involving the left
inferior pubic rami is identified. Small focus of increased uptake
at the approximate T8 level also noted. Small focus of increased
uptake localizing to the posterior aspect of the left 6th rib noted.
Single focus of increased uptake within the left iliac bone near the
SI joint is noted which corresponds to a suspicious lucent lesion on
recent CT. A smaller focus of increased uptake is noted within the
right lower sacrum or iliac bone. No abnormal uptake identified
within the appendicular skeleton. Physiologic tracer activity seen
within the kidneys an urinary bladder.
IMPRESSION: Imaging findings compatible with multifocal bone metastases
including bilateral pelvis lesions, thoracic spine, and left
posterior rib.

## 2019-07-26 MED ORDER — TECHNETIUM TC 99M MEDRONATE IV KIT
20.0000 | PACK | Freq: Once | INTRAVENOUS | Status: AC | PRN
  Administered 2019-07-26: 12:00:00 22 via INTRAVENOUS

## 2019-08-16 ENCOUNTER — Other Ambulatory Visit: Payer: PPO

## 2019-08-16 ENCOUNTER — Telehealth: Payer: Self-pay | Admitting: Medical Oncology

## 2019-08-16 ENCOUNTER — Encounter: Payer: Self-pay | Admitting: *Deleted

## 2019-08-16 DIAGNOSIS — C61 Malignant neoplasm of prostate: Secondary | ICD-10-CM | POA: Diagnosis not present

## 2019-08-16 DIAGNOSIS — R310 Gross hematuria: Secondary | ICD-10-CM | POA: Diagnosis not present

## 2019-08-16 DIAGNOSIS — Z5111 Encounter for antineoplastic chemotherapy: Secondary | ICD-10-CM | POA: Diagnosis not present

## 2019-08-16 NOTE — Telephone Encounter (Signed)
I called pt to introduce myself as the Prostate Nurse Navigator and the Coordinator of the [place].  1. I confirmed with the patient he is aware of his referral to the clinic 9/11.  2. I discussed the format of the clinic and the physicians he will be seeing that day.  3. I discussed clinic can be held by WebEx or he can come to [facility]. If he does WebEx, he will be able to include his wife but if he comes in, he cannot have a visitor.Marland Kitchen He would like to do the WebEx. We did a trial WebEx and it went well. I will call him in the morning after conference, to let him know we are ready to begin the appointment. He voiced understanding. I asked him to call me with questions or concerns at [phone].

## 2019-08-17 ENCOUNTER — Inpatient Hospital Stay: Payer: PPO | Attending: Oncology | Admitting: Oncology

## 2019-08-17 ENCOUNTER — Telehealth: Payer: Self-pay

## 2019-08-17 ENCOUNTER — Telehealth: Payer: Self-pay | Admitting: Pharmacist

## 2019-08-17 ENCOUNTER — Ambulatory Visit
Admission: RE | Admit: 2019-08-17 | Discharge: 2019-08-17 | Disposition: A | Discharge status: Home / Self Care | Payer: PPO | Source: Ambulatory Visit | Attending: Radiation Oncology | Admitting: Radiation Oncology

## 2019-08-17 ENCOUNTER — Encounter: Payer: Self-pay | Admitting: Medical Oncology

## 2019-08-17 DIAGNOSIS — C7911 Secondary malignant neoplasm of bladder: Secondary | ICD-10-CM | POA: Diagnosis not present

## 2019-08-17 DIAGNOSIS — C61 Malignant neoplasm of prostate: Secondary | ICD-10-CM

## 2019-08-17 DIAGNOSIS — Z79899 Other long term (current) drug therapy: Secondary | ICD-10-CM | POA: Diagnosis not present

## 2019-08-17 DIAGNOSIS — Z192 Hormone resistant malignancy status: Secondary | ICD-10-CM | POA: Diagnosis not present

## 2019-08-17 DIAGNOSIS — I2699 Other pulmonary embolism without acute cor pulmonale: Secondary | ICD-10-CM

## 2019-08-17 DIAGNOSIS — R9721 Rising PSA following treatment for malignant neoplasm of prostate: Secondary | ICD-10-CM | POA: Diagnosis not present

## 2019-08-17 DIAGNOSIS — R3989 Other symptoms and signs involving the genitourinary system: Secondary | ICD-10-CM | POA: Diagnosis not present

## 2019-08-17 DIAGNOSIS — C7951 Secondary malignant neoplasm of bone: Secondary | ICD-10-CM | POA: Diagnosis not present

## 2019-08-17 MED ORDER — ABIRATERONE ACETATE 250 MG PO TABS: 1000.0000 mg | ORAL_TABLET | Freq: Every day | ORAL | 0 refills | Status: DC

## 2019-08-17 MED ORDER — PREDNISONE 5 MG PO TABS: 5.0000 mg | ORAL_TABLET | Freq: Every day | ORAL | 1 refills | Status: DC

## 2019-08-17 NOTE — Progress Notes (Addendum)
                               Care Plan Summary  Name: Mr. Chay Theisen DOB: 05/02/1951   Your Medical Team:   Urologist -  Dr. Raynelle Bring, [facility]  Radiation Oncologist - Dr. Tyler Pita, [facility]   Medical Oncologist - Dr. Zola Button, [place]  Recommendations: 1) Continue androgen deprivation 2) Zytiga   * These recommendations are based on information available as of today's consult.      Recommendations may change depending on the results of further tests or exams.  Next Steps: 1) The oral chemo pharmacist will call you to discuss Zytiga     When appointments need to be scheduled, you will be contacted by [facility] and/or [facility].  Questions?  Please do not hesitate to call Cira Rue, RN, BSN, OCN at [phone] any questions or concerns.  Shirlean Mylar is your Oncology Nurse Navigator and is available to assist you while you're receiving your medical care at [facility].

## 2019-08-17 NOTE — Telephone Encounter (Signed)
Oral Oncology Patient Advocate Encounter  [name] copay is [phone], this is not affordable for the patient.  The patient does not qualify for a grant due to income.  I can help the patient apply for patient assistance with Wynetta Emery and Wynetta Emery in an attempt to get his Zytiga at $0 out of pocket cost to him.  I spoke to the patient and went over this information, he agreed to apply for assistance. I will complete the application for him. He will come in on Monday [id number] to sign the application and bring in his 2019 tax return.  The patient verbalized understanding and great appreciation.  [place] Patient [place] Phone [phone] Fax [phone] 08/17/2019   3:44 PM

## 2019-08-17 NOTE — Telephone Encounter (Signed)
Oral Oncology Patient Advocate Encounter  Received notification from [facility] that prior authorization for Abiraterone is required.  PA submitted on CoverMyMeds Key [id number] Status is pending  Oral Oncology Clinic will continue to follow.  [place] Patient [place] Phone [phone] Fax [phone] 08/17/2019    12:22 PM

## 2019-08-17 NOTE — Telephone Encounter (Signed)
Oral Oncology Pharmacist Encounter  Received new prescription for Zytiga (abiraterone) for the treatment of metastatic, castration-sensitive prostate cancer in conjunction with prednisone and androgen deprivation therapy (leuprolide injections), planned duration until disease progression or unacceptable toxicity.  Labs from [facility] assessed, [place] for treatment initiation. No recent assessment of liver function, no evidence of hepatic dysfunction seen on lab check from 08/03/18, will continue to be monitored on treatment 07/16/19 [facility] scanned document) SCr=1.4, est CrCl ~ 55 mL/min No dose reduction per manufacturer in reduced renal function  Current medication list in Epic reviewed, no significant DDIs with abiraterine identified.  Prescription has been e-scribed to the [facility] for benefits analysis and approval by MD.  Oral Oncology Clinic will continue to follow for insurance authorization, copayment issues, initial counseling and start date.  Johny Drilling, PharmD, BCPS, BCOP  08/17/2019 10:40 AM Oral Oncology Clinic [phone]

## 2019-08-17 NOTE — Progress Notes (Signed)
Hematology and Oncology consult  Shane [name] [id number] Sep 24, 1951 68 y.o. 08/17/2019 9:06 AM [name], MDGutierrez, [name], MD   I connected with Shane [name] on 08/17/19 at  8:30 AM EDT by video enabled telemedicine visit and verified that I am speaking with the correct person using two identifiers.   I discussed the limitations, risks, security and privacy concerns of performing an evaluation and management service by telemedicine and the availability of in-person appointments. I also discussed with the patient that there may be a patient responsible charge related to this service. The patient expressed understanding and agreed to proceed.  Other persons participating in the visit and their role in the encounter: His spouse Dub Mikes  Patient's location: Home Provider's location: Office   Reason for the request:    Prostate cancer  HPI: I was asked by Dr. [name]   to evaluate Shane [name] for the evaluation of breath.  He is a 68 year old man with no significant comorbid conditions started developing urinary symptoms dating back to December 2019.  He was evaluated with Dr. [name] at that time had a PSA checked which was showed a PSA of 2.7.  He was treated with prostatitis initially but subsequently started developing gross hematuria in August 2020.  He was evaluated again by Dr. [name] and underwent a cystoscopy on July 06, 2019 for the evaluation of hematuria.  He was found to have bilateral hydronephrosis and tumors of the bladder neck and the prostate.  He underwent a transurethral resection of the bladder tumor and bilateral stent placement.  He final pathology showed a prostate adenocarcinoma with a Gleason score 5+4 = 9.  CT scan on July 26, 2019.  The CT scan showed acute pulmonary emboli within the right lower lobe pulmonary arteries as well as a metastatic adenopathy in the pelvis involving the presacral space and the right pelvic sidewall.  Bone scan obtained on  07/26/2019 showed multifocal metastatic disease including bilateral pelvic lesions thoracic spine and left posterior rib.  Based on these findings, he was started on Firmagon in August 2020 and subsequently received Eligard on 08/16/2019.  Repeat PSA at that time showed 1.28.  Clinically, he continues to have urinary frequency, nocturia and hematuria.  He remains on Xarelto because of his pulmonary embolism without any major complications.  He remains active and continues to attend activities of daily living.  He does not have a urinary catheter at this time.  His performance status remains excellent.  He does not report any headaches, blurry vision, syncope or seizures. Does not report any fevers, chills or sweats.  Does not report any cough, wheezing or hemoptysis.  Does not report any chest pain, palpitation, orthopnea or leg edema.  Does not report any nausea, vomiting or abdominal pain.  Does not report any constipation or diarrhea.  Does not report any skeletal complaints.   Does not report any skin rashes or lesions. Does not report any heat or cold intolerance.  Does not report any lymphadenopathy or petechiae.  Does not report any anxiety or depression.  Remaining review of systems is negative.    Past Medical History:  Diagnosis Date  . History of chicken pox   . Lipoma of neck   . Seasonal allergies   . Syncopal episodes 2007   x 2 s/p unrevealing hospitalization without recurrence  :  Past Surgical History:  Procedure Laterality Date  . CYSTOSCOPY W/ RETROGRADES Bilateral 07/06/2019   Procedure: CYSTOSCOPY WITH RETROGRADE PYELOGRAM, BILATERAL STENT PLACEMENT;  Surgeon: [name],  Viona Gilmore, MD;  Location: [facility];  Service: Urology;  Laterality: Bilateral;  . EP IMPLANTABLE DEVICE N/A 02/25/2016   Procedure: Loop Recorder Removal;  Surgeon: Deboraha Sprang, MD;  Location: [facility];  Service: Cardiovascular;  Laterality: N/A;  . Internal Cardiac Monitor  2007   due to syncopal  episodes - never contacted for f/u  . LIPOMA EXCISION N/A 02/09/2016   Procedure: EXCISION NECK LIPOMA;  Surgeon: Autumn Messing III, MD;  Location: [facility];  Service: General;  Laterality: N/A;  . TRANSURETHRAL RESECTION OF BLADDER TUMOR N/A 07/06/2019   Procedure: TRANSURETHRAL RESECTION OF BLADDER TUMOR (TURBT);  Surgeon: [name], MD;  Location: [facility];  Service: Urology;  Laterality: N/A;  :   Current Outpatient Medications:  .  Diphenhyd-Hydrocort-Nystatin (FIRST-DUKES MOUTHWASH) SUSP, Use as directed 5-10 mLs in the mouth or throat 3 (three) times daily. (Patient not taking: Reported on 06/29/2019), Disp: 237 mL, Rfl: 0 .  naproxen sodium (ALEVE) 220 MG tablet, Take 220 mg by mouth 2 (two) times daily with a meal., Disp: , Rfl:  .  tamsulosin (FLOMAX) 0.4 MG CAPS capsule, Take 0.4 mg by mouth daily., Disp: , Rfl:  .  traMADol (ULTRAM) 50 MG tablet, Take 1-2 tablets (50-100 mg total) by mouth every 6 (six) hours as needed for moderate pain., Disp: 15 tablet, Rfl: 0:  Allergies  Allergen Reactions  . Bactrim [Sulfamethoxazole-Trimethoprim] Hives    Red rash, hives  . Percocet [Oxycodone-Acetaminophen] Other (See Comments)    TURNS RED FROM CHEST UP, LOOKS LIKE SUNBURN AND STARTS TO [place] BADLY  . Penicillins Swelling and Rash    Did it involve swelling of the face/tongue/throat, SOB, or low BP? No Did it involve sudden or severe rash/hives, skin peeling, or any reaction on the inside of your mouth or nose? No Did you need to seek medical attention at a hospital or doctor's office? No When did it last happen?50 years ago If all above answers are "NO", may proceed with cephalosporin use.   Marland Kitchen Zithromax [Azithromycin] Rash  :  Family History  Problem Relation Age of Onset  . Hyperlipidemia Mother   . Cancer Mother        peritoneal cancer  . Hemochromatosis Father        s/p liver transplant - pt screened negative 07/2015  . Hemochromatosis Paternal  Grandmother   . Hemochromatosis Paternal Uncle   . Stroke Neg Hx   . Diabetes Neg Hx   . CAD Neg Hx   :  Social History   Socioeconomic History  . Marital status: Married    Spouse name: Not on file  . Number of children: Not on file  . Years of education: Not on file  . Highest education level: Not on file  Occupational History  . Not on file  Social Needs  . Financial resource strain: Not on file  . Food insecurity    Worry: Not on file    Inability: Not on file  . Transportation needs    Medical: Not on file    Non-medical: Not on file  Tobacco Use  . Smoking status: Never Smoker  . Smokeless tobacco: Never Used  Substance and Sexual Activity  . Alcohol use: Yes    Alcohol/week: 0.0 standard drinks    Comment: 1 beer most evenings  . Drug use: No  . Sexual activity: Not on file  Lifestyle  . Physical activity    Days per week:  Not on file    Minutes per session: Not on file  . Stress: Not on file  Relationships  . Social Herbalist on phone: Not on file    Gets together: Not on file    Attends religious service: Not on file    Active member of club or organization: Not on file    Attends meetings of clubs or organizations: Not on file    Relationship status: Not on file  . Intimate partner violence    Fear of current or ex partner: Not on file    Emotionally abused: Not on file    Physically abused: Not on file    Forced sexual activity: Not on file  Other Topics Concern  . Not on file  Social History Narrative   Lives with wife   Grown children   Occ: retired Engineer, structural, Musician   Edu: Bachelor of Art   Activity: stays active outdoors working   Diet: some water, fruits/vegetables daily  :  Pertinent items are noted in HPI.  Exam: ECOG 0  General appearance: alert and cooperative appeared without distress. Head: atraumatic without any abnormalities. Eyes: conjunctivae/corneas clear. PERRL.  Sclera  anicteric. Throat: lips, mucosa, and tongue normal; without oral thrush or ulcers. Resp: clear to auscultation bilaterally without rhonchi, wheezes or dullness to percussion. Cardio: regular rate and rhythm, S1, S2 normal, no murmur, click, rub or gallop GI: soft, non-tender; bowel sounds normal; no masses,  no organomegaly Skin: Skin color, texture, turgor normal. No rashes or lesions Lymph nodes: Cervical, supraclavicular, and axillary nodes normal. Neurologic: Grossly normal without any motor, sensory or deep tendon reflexes. Musculoskeletal: No joint deformity or effusion.  CBC    Component Value Date/Time   WBC 9.2 07/06/2019 1341   RBC 3.71 (L) 07/06/2019 1341   HGB 11.7 (L) 07/06/2019 1341   HCT 35.2 (L) 07/06/2019 1341   PLT 229 07/06/2019 1341   MCV 94.9 07/06/2019 1341   MCH 31.5 07/06/2019 1341   MCHC 33.2 07/06/2019 1341   RDW 11.6 07/06/2019 1341   LYMPHSABS 1.3 02/18/2016 0920   MONOABS 0.4 02/18/2016 0920   EOSABS 0.2 02/18/2016 0920   BASOSABS 0.0 02/18/2016 0920     Chemistry      Component Value Date/Time   NA 141 07/06/2019 1341   K 4.0 07/06/2019 1341   CL 108 07/06/2019 1341   CO2 21 (L) 07/06/2019 1341   BUN 28 (H) 07/06/2019 1341   CREATININE 2.18 (H) 07/06/2019 1341      Component Value Date/Time   CALCIUM 9.1 07/06/2019 1341   ALKPHOS 42 08/03/2018 0740   AST 23 08/03/2018 0740   ALT 24 08/03/2018 0740   BILITOT 0.8 08/03/2018 0740      Nm Bone Scan Whole Body  Result Date: 07/26/2019 CLINICAL DATA:  Recent diagnosis of prostate cancer.  PSA is 2.7. EXAM: NUCLEAR MEDICINE WHOLE BODY BONE SCAN TECHNIQUE: Whole body anterior and posterior images were obtained approximately 3 hours after intravenous injection of radiopharmaceutical. RADIOPHARMACEUTICALS:  22.0 mCi Technetium-66m MDP IV COMPARISON:  CT 07/26/2019. FINDINGS: Abnormal asymmetric increased radiotracer uptake involving the left inferior pubic rami is identified. Small focus of  increased uptake at the approximate T8 level also noted. Small focus of increased uptake localizing to the posterior aspect of the left 6th rib noted. Single focus of increased uptake within the left iliac bone near the SI joint is noted which corresponds to a suspicious lucent lesion on  recent CT. A smaller focus of increased uptake is noted within the right lower sacrum or iliac bone. No abnormal uptake identified within the appendicular skeleton. Physiologic tracer activity seen within the kidneys an urinary bladder. IMPRESSION: Imaging findings compatible with multifocal bone metastases including bilateral pelvis lesions, thoracic spine, and left posterior rib. Electronically Signed   By: Kerby Moors M.D.   On: 07/26/2019 17:08    Assessment and Plan:    68 year old with:  1.  Castration-sensitive advanced prostate cancer with metastatic disease to the pelvic adenopathy and bone.  He presented with disease invasion into the bladder and a Gleason score of 5+4 = 9 and a PSA of 2.7.  His case was discussed today in the prostate cancer multidisciplinary clinic including discussion with reviewing radiologist as well as review of his pathology with the reviewing pathologist.  Treatment options were also reviewed at this time extensively.  He understands that he has an incurable malignancy and any treatment at this time would offer palliation forward.  However, he has excellent performance status and aggressive measures are warranted.  The backbone of treating advanced prostate cancer remains androgen deprivation therapy which she has started at this time.  Therapy escalation will be important at this time which has proven to improve progression free survival as well as overall survival.  Options of therapy would be androgen synthesis inhibitors, androgen receptor blockade versus systemic chemotherapy.  Risks and benefits of all these options were reviewed.  Potential complications were weighed.  After  discussion today, we opted to proceed with Zytiga 1000 mg daily and prednisone 5 mg daily.  Complication associated with this treatment including nausea, fatigue, edema, hypertension and hypokalemia.  Compared to complications related to chemotherapy which include nausea, vomiting, myelosuppression and neutropenia.  I feel that deferring chemotherapy for the time being is reasonable given the lack of visceral disease and the lack of high burden of disease at this time.  His performance status is excellent and chemotherapy can be deferred to a later date without losing the opportunity to treat him with it.  Complication associated with prednisone was also reiterated which include mood swings, hyperglycemia, and others.  He is agreeable to proceed at this time.  2.  Pulmonary embolism: I have recommended continuing Xarelto at this time for 3 to 6 months.  3.  Bilateral hydronephrosis: He has bilateral stent placement and management will be per Dr. [name].  4.  Hematuria: Related to Xarelto as well as a urinary tract infection.  We have discussed the role of palliative radiation therapy if it felt that his bleeding is related to tumor progression.  5.  Follow-up: We will be in the next 4 to 6 weeks for follow-up after starting Zytiga.   Thank you for the referral.  I had the pleasure of meeting this patient today.  A copy of this consult has been forwarded to the requesting physician. I discussed the assessment and treatment plan with the patient. The patient was provided an opportunity to ask questions and all were answered. The patient agreed with the plan and demonstrated an understanding of the instructions.   The patient was advised to call back or seek an in-person evaluation if the symptoms worsen or if the condition fails to improve as anticipated.  I provided 60 minutes of face-to-face video visit time during this encounter, and > 50% was dedicated to reviewing his disease status, reviewing  imaging studies, pathology reports, treatment options and complications related therapy.  Zola Button,  MD 08/17/2019 9:06 AM

## 2019-08-17 NOTE — Progress Notes (Signed)
Radiation Oncology         [phone] ________________________________  Multidisciplinary Prostate Cancer Clinic  Initial outpatient Consultation - Conducted via WebEx due to current COVID-19 concerns for limiting patient exposure  Name: Shane [name] MRN: [id number]  Date: 08/17/2019  DOB: June 08, 1951  TL:[id number], [name], MD  Raynelle Bring, MD   REFERRING PHYSICIAN: Raynelle Bring, MD  DIAGNOSIS: 68 y.o. gentleman with Stage T4 N1 M1 adenocarcinoma of the prostate with Gleason score of 5+4, and PSA of 2.7.    ICD-10-CM   1. Malignant neoplasm of prostate ([place])  C61     HISTORY OF PRESENT ILLNESS: Shane [name] is a 68 y.o. male with a diagnosis of prostate cancer. He initially presented to Dr. [name] with irritative voiding symptoms on 12/11/2018.  P[place]uvel rectal exam notable for induration and tenderness to palpation, consistent with prostatitis.  Therefore, he was treated for prostatitis with Flomax, antibiotics and anti-inflammatory with no significant improvement. He was then referred for pelvic floor physical therapy, which seemed to help some. More recently, he developed gross hematuria and progressive LUTS in July 2020 which was evaluated with in office cystoscopy and revealed two separate bladder tumors, 1 of which appeared to be emanating from the prostate. A CT A/P on 07/02/19, performed without contrast due to patient's worsening renal funtion, showed a bladder mass with bilateral hydroureteronephrosis, enlarged right external iliac node and multiple soft tissue masses within the pelvis, most consistent with metastatic disease.  There was also a lytic metastatic lesion involving the left inferior pubic ramus and posterior left ilium as well as multiple pulmonary nodules bilaterally.  He proceeded to TURBT/TURP on 07/06/2019. During the procedure, he was found to have tumor extending around the prostatic urethra at the bladder neck and extending into the trigonal region  contributing to his bilateral hydronephrosis/obstruction.  Bilateral ureteral stents were placed at the time of his procedure and final pathology revealed prostatic adenocarcinoma, Gleason 5+4, involving 80% of the resected tissue.  He was started on Firmagon for ADT on 07/16/2019 and his PSA, when repeated most recently, had decreased to 1.28 after a month on Firmagon.  A CT C/A/P for disease staging was performed on 07/26/2019 and showed incidental acute PEs, right middle and upper lobe pulmonary nodules, metastatic adenopathy in the pelvis involving the presacral space and right pelvic sidewall; an irregular prostate gland, left bladder wall thickening and bone metastasis to the left iliac bone. A bone scan performed the same day showed multifocal bone metastases including bilateral pelvic lesions, thoracic spine lesions and a left posterior rib lesion.  Fortunately, none of these lesions are painful at present.  He has continued with intermittent recurrent gross hematuria and was recently diagnosed with a Pseudomonas UTI for which he will require treatment with IV antibiotics.  He had an Eligard ADT injection on 08/16/2019 for continuation of his ADT.  The patient reviewed the biopsy and recent imaging results with his urologist and he has kindly been referred today to the multidisciplinary prostate cancer clinic for presentation of pathology and radiology studies in our conference for discussion of potential radiation treatment options and clinical evaluation.   PREVIOUS RADIATION THERAPY: No  PAST MEDICAL HISTORY:  Past Medical History:  Diagnosis Date   History of chicken pox    Lipoma of neck    Seasonal allergies    Syncopal episodes 2007   x 2 s/p unrevealing hospitalization without recurrence      PAST SURGICAL HISTORY: Past Surgical History:  Procedure Laterality Date  CYSTOSCOPY W/ RETROGRADES Bilateral 07/06/2019   Procedure: CYSTOSCOPY WITH RETROGRADE PYELOGRAM, BILATERAL  STENT PLACEMENT;  Surgeon: [name], MD;  Location: [facility];  Service: Urology;  Laterality: Bilateral;   EP IMPLANTABLE DEVICE N/A 02/25/2016   Procedure: Loop Recorder Removal;  Surgeon: Deboraha Sprang, MD;  Location: [facility];  Service: Cardiovascular;  Laterality: N/A;   Internal Cardiac Monitor  2007   due to syncopal episodes - never contacted for f/u   LIPOMA EXCISION N/A 02/09/2016   Procedure: EXCISION NECK LIPOMA;  Surgeon: Autumn Messing III, MD;  Location: [facility];  Service: General;  Laterality: N/A;   TRANSURETHRAL RESECTION OF BLADDER TUMOR N/A 07/06/2019   Procedure: TRANSURETHRAL RESECTION OF BLADDER TUMOR (TURBT);  Surgeon: [name], MD;  Location: [facility];  Service: Urology;  Laterality: N/A;    FAMILY HISTORY:  Family History  Problem Relation Age of Onset   Hyperlipidemia Mother    Cancer Mother        peritoneal cancer   Hemochromatosis Father        s/p liver transplant - pt screened negative 07/2015   Hemochromatosis Paternal Grandmother    Hemochromatosis Paternal Uncle    Stroke Neg Hx    Diabetes Neg Hx    CAD Neg Hx     SOCIAL HISTORY:  Social History   Socioeconomic History   Marital status: Married    Spouse name: Not on file   Number of children: Not on file   Years of education: Not on file   Highest education level: Not on file  Occupational History   Not on file  Social Needs   Financial resource strain: Not on file   Food insecurity    Worry: Not on file    Inability: Not on file   Transportation needs    Medical: Not on file    Non-medical: Not on file  Tobacco Use   Smoking status: Never Smoker   Smokeless tobacco: Never Used  Substance and Sexual Activity   Alcohol use: Yes    Alcohol/week: 0.0 standard drinks    Comment: 1 beer most evenings   Drug use: No   Sexual activity: Not on file  Lifestyle   Physical activity    Days per week: Not on file    Minutes  per session: Not on file   Stress: Not on file  Relationships   Social connections    Talks on phone: Not on file    Gets together: Not on file    Attends religious service: Not on file    Active member of club or organization: Not on file    Attends meetings of clubs or organizations: Not on file    Relationship status: Not on file   Intimate partner violence    Fear of current or ex partner: Not on file    Emotionally abused: Not on file    Physically abused: Not on file    Forced sexual activity: Not on file  Other Topics Concern   Not on file  Social History Narrative   Lives with wife   Grown children   Occ: retired Engineer, structural, Musician   Edu: Bachelor of Art   Activity: stays active outdoors working   Diet: some water, fruits/vegetables daily    ALLERGIES: Bactrim [sulfamethoxazole-trimethoprim], Percocet [oxycodone-acetaminophen], Penicillins, and Zithromax [azithromycin]  MEDICATIONS:  Current Outpatient Medications  Medication Sig Dispense Refill   abiraterone acetate (ZYTIGA) 250 MG  tablet Take 4 tablets (1,000 mg total) by mouth daily. Take on an empty stomach 1 hour before or 2 hours after a meal 120 tablet 0   Diphenhyd-Hydrocort-Nystatin (FIRST-DUKES MOUTHWASH) SUSP Use as directed 5-10 mLs in the mouth or throat 3 (three) times daily. (Patient not taking: Reported on 06/29/2019) 237 mL 0   naproxen sodium (ALEVE) 220 MG tablet Take 220 mg by mouth 2 (two) times daily with a meal.     predniSONE (DELTASONE) 5 MG tablet Take 1 tablet (5 mg total) by mouth daily with breakfast. 90 tablet 1   Rivaroxaban (XARELTO) 15 MG TABS tablet Take 15 mg by mouth 2 (two) times daily with a meal.     tamsulosin (FLOMAX) 0.4 MG CAPS capsule Take 0.4 mg by mouth daily.     traMADol (ULTRAM) 50 MG tablet Take 1-2 tablets (50-100 mg total) by mouth every 6 (six) hours as needed for moderate pain. 15 tablet 0   No current facility-administered medications  for this encounter.     REVIEW OF SYSTEMS:  On review of systems, the patient reports that he is doing well overall. He denies any chest pain, shortness of breath, cough, fevers, chills, night sweats, unintended weight changes. He denies any bowel disturbances, and denies abdominal pain, nausea or vomiting. He denies any new musculoskeletal or joint aches or pains. He reports dysuria, frequent urination, urinary urgency, nocturia x4-7, urinary leakage, intermittent stream, and penile pain.  He has also had intermittent gross hematuria likely secondary to the need for Xarelto for management of his pulmonary emboli in addition to his bilateral ureteral stents.  He has recently completed a course of fosfomycin for treatment of a resistant Pseudomonas UTI and a repeat urine culture from 08/16/19 remains pending.  A complete review of systems is obtained and is otherwise negative.  PHYSICAL EXAM:  Wt Readings from Last 3 Encounters:  07/06/19 187 lb (84.8 kg)  07/04/19 187 lb (84.8 kg)  11/10/18 195 lb 8 oz (88.7 kg)   Temp Readings from Last 3 Encounters:  07/07/19 98.2 F (36.8 C) (Oral)  07/04/19 98.9 F (37.2 C) (Oral)  11/10/18 97.8 F (36.6 C) (Oral)   BP Readings from Last 3 Encounters:  07/07/19 131/68  07/04/19 (!) 158/82  11/10/18 134/74   Pulse Readings from Last 3 Encounters:  07/07/19 60  07/04/19 63  11/10/18 (!) 51    /10  In general this is a well appearing Caucasian male in no acute distress. He's alert and oriented x4 and appropriate throughout the examination. Cardiopulmonary assessment is negative for acute distress and he exhibits normal effort.   KPS = 70  100 - Normal; no complaints; no evidence of disease. 90   - Able to carry on normal activity; minor signs or symptoms of disease. 80   - Normal activity with effort; some signs or symptoms of disease. 45   - Cares for self; unable to carry on normal activity or to do active work. 60   - Requires occasional  assistance, but is able to care for most of his personal needs. 50   - Requires considerable assistance and frequent medical care. 23   - Disabled; requires special care and assistance. 26   - Severely disabled; hospital admission is indicated although death not imminent. 59   - Very sick; hospital admission necessary; active supportive treatment necessary. 10   - Moribund; fatal processes progressing rapidly. 0     - Dead  Karnofsky DA, [place],  Craver LS and Burchenal JH (1948) The use of the nitrogen mustards in the palliative treatment of carcinoma: with particular reference to bronchogenic carcinoma Cancer 1 634-56  LABORATORY DATA:  Lab Results  Component Value Date   WBC 9.2 07/06/2019   HGB 11.7 (L) 07/06/2019   HCT 35.2 (L) 07/06/2019   MCV 94.9 07/06/2019   PLT 229 07/06/2019   Lab Results  Component Value Date   NA 141 07/06/2019   K 4.0 07/06/2019   CL 108 07/06/2019   CO2 21 (L) 07/06/2019   Lab Results  Component Value Date   ALT 24 08/03/2018   AST 23 08/03/2018   ALKPHOS 42 08/03/2018   BILITOT 0.8 08/03/2018     RADIOGRAPHY: Nm Bone Scan Whole Body  Result Date: 07/26/2019 CLINICAL DATA:  Recent diagnosis of prostate cancer.  PSA is 2.7. EXAM: NUCLEAR MEDICINE WHOLE BODY BONE SCAN TECHNIQUE: Whole body anterior and posterior images were obtained approximately 3 hours after intravenous injection of radiopharmaceutical. RADIOPHARMACEUTICALS:  22.0 mCi Technetium-31mMDP IV COMPARISON:  CT 07/26/2019. FINDINGS: Abnormal asymmetric increased radiotracer uptake involving the left inferior pubic rami is identified. Small focus of increased uptake at the approximate T8 level also noted. Small focus of increased uptake localizing to the posterior aspect of the left 6th rib noted. Single focus of increased uptake within the left iliac bone near the SI joint is noted which corresponds to a suspicious lucent lesion on recent CT. A smaller focus of increased uptake is  noted within the right lower sacrum or iliac bone. No abnormal uptake identified within the appendicular skeleton. Physiologic tracer activity seen within the kidneys an urinary bladder. IMPRESSION: Imaging findings compatible with multifocal bone metastases including bilateral pelvis lesions, thoracic spine, and left posterior rib. Electronically Signed   By: TKerby MoorsM.D.   On: 07/26/2019 17:08      IMPRESSION/PLAN: This visit was conducted via WebEx to spare the patient unnecessary potential exposure in the healthcare setting during the current COVID-19 pandemic.  1. 68y.o. gentleman with Stage T4 N1 M1, Gleason 5+4, advanced, castration-naive, metastatic adenocarcinoma of the prostate with disease invading the bladder as well as metastatic disease to the skeleton with a PSA of 2.7 at the time of diagnosis. We discussed the patient's workup and outlined the nature of prostate cancer in this setting. The patient's T stage, Gleason's score, and PSA put him into the high risk group and recent imaging confirms advanced metastatic disease. Accordingly, his disease is not curative and any treatment at this point would be of a palliative nature.  He was started on Firmagon at his visit with Dr. HLouis Meckelon 07/16/2019 and more recently received his first Eligard injection 08/16/19. He has also met with Dr. SAlen Blewand Dr[facility] who agree with proceeding with additional systemic therapy in the form of androgen synthesis inhibitor, Zytiga, along with continued ADT.  Consideration for systemic chemotherapy could be given in the future once his ureteral stents are removed and his UTI is resolved. We briefly discussed the available radiation techniques, and focused on the details of logistics and delivery as it pertains to palliative radiotherapy for local disease control and/or painful bony metastases. We discussed that the patient's severe urinary symptoms need to be addressed before considering  and proceeding with local radiation therapy to the bladder/prostate tumor.  Fortunately, he is not currently having any bony symptoms but he understands that should he develop any focal pain at known sites of  disease, we could consider palliative radiation at that time.  At the end of the conversation the patient is interested in moving forward with ADT and Zytiga.  Pending his response to these therapies as well as improvement in his LUTS, we will continue to follow and give consideration to proceeding with local radiotherapy if indicated.  I reassured the patient and his wife that we are here for him if or when the need arises. I look forward to following along in the care of this very nice gentleman.  Given current concerns for patient exposure during the COVID-19 pandemic, this encounter was conducted via video-enabled MyChart visit. The patient has given verbal consent for this type of encounter. The time spent during this encounter was 30 minutes. The attendants for this meeting include Tyler Pita MD, [place], patient, Kuzey Ogata and wife, Dub Mikes. During the encounter, Tyler Pita MD and scribe, Wilburn Mylar were located at [facility] Radiation Oncology Department.  Patient, Shane [name] and wife, Dub Mikes, were located at home.    Nicholos Johns, PA-C    Tyler Pita, MD  [facility] Direct Dial: [phone]   Fax: [phone] [email or web address]   Skype   LinkedIn  This document serves as a record of services personally performed by Tyler Pita, MD and Freeman Caldron, PA-C. It was created on their behalf by Wilburn Mylar, a trained medical scribe. The creation of this record is based on the scribe's personal observations and the provider's statements to them. This document has been checked and approved by the attending provider.

## 2019-08-17 NOTE — Telephone Encounter (Signed)
Oral Oncology Patient Advocate Encounter  Prior Authorization for Shane [name] has been approved.    PA# [id number] Effective dates: 08/17/19 through 08/16/20  Oral Oncology Clinic will continue to follow.   [place] [facility] [name] Phone [phone] Fax [phone] 08/17/2019    1:20 PM

## 2019-08-17 NOTE — Telephone Encounter (Signed)
Oral Chemotherapy Pharmacist Encounter   I spoke with patient for overview of: Zytiga (abiraterone) for the treatment of metastatic, castration-sensitive prostate cancer in conjunction with prednisone and androgen deprivation therapy (leuprolide injections), planned duration until disease progression or unacceptable toxicity.   Counseled patient on administration, dosing, side effects, monitoring, drug-food interactions, safe handling, storage, and disposal.  Patient will take Zytiga 250mg  tablets, 4 tablets (1000mg ) by mouth once daily on an empty stomach, 1 hour before or 2 hours after a meal.  Patient states he will take his Zyitga before bed each evening, at least 2 hours after dinner.  Patient will take prednisone 5mg  tablet, 1 tablet by mouth one daily with breakfast.  Zytiga start date: TBD, pending medication acquisition  Adverse effects include but are not limited to: peripheral edema, GI upset, hypertension, hot flashes, fatigue, and arthralgias.    Patient states he is already experiencing decreased energy and some hot flashes secondary to ADT. He will alert the office if these worsen significantly.  Prednisone prescription has been sent to [facility] Patient will obtain prednisone and knows to start prednisone on the same day as Zytiga start.  Reviewed with patient importance of keeping a medication schedule and plan for any missed doses.  Medication reconciliation performed and medication/allergy list updated.  Insurance authorization for [name] has been obtained. Test claim at the pharmacy revealed copayment $1200 for 1st fill of Zytiga. This is not affordable to patient.  A foundation grant opened this afternoon for patient's diagnosis, but patient was deemed over the income limit when they used an adjustment for cost of living index. Oral oncology patient advocate will assist patient in completing application for manufacturer compassionate use  program through [facility]. This will be updated in a separate encounter.  Patient will be supplied donated Zytiga in order to prevent a delay in therapy start.  All questions answered.  Shane [name] voiced understanding and appreciation.   Patient knows to call the office with questions or concerns.   Shane [name], PharmD, BCPS, BCOP  08/17/2019 1:45 PM Oral Oncology Clinic [phone]

## 2019-08-17 NOTE — Consult Note (Signed)
Telehealth Visit     08/17/2019     --------------------------------------------------------------------------------     Shane [name]   [id number]   DOB: May 20, 1951, 68 year old Male   SSN:     PRIMARY CARE:  Shane Bush, MD   REFERRING:  Shane Bush, MD   PROVIDER:  Louis [name], M.D.   TREATING:  Shane [name], M.D.   LOCATION:  [facility] [phone] [id number]        --------------------------------------------------------------------------------     CC/HPI: CC: Prostate Cancer     Physician requesting consult: Dr. Burman [name]   PCP: Dr. Ria [name]   Location of consult: TELEHEALTH - Prostate Cancer Multidisciplinary Clinic     [name] is a 68 year old male who presented initially to Dr. Louis [name] in January 2020 with worsening LUTS and he was felt to have prostatitis. His PSA at that time was 2.70. He was treated for prostatitis with minimal improvement in his LUTS. He then was treated with physical therapy and did improve. He was discharged from follow up but again presented to Dr. Louis [name] in July 2020 with gross hematuria. Cystoscopy revealed two bladder tumors along the trigone of the bladder. His Cr initially was 2.5 and he underwent non-contrast CT imaging that revealed a bladder mass, left hydronephrosis with dilation to the level of the bladder. He was taken to the OR on 07/06/19 and underwent bilateral ureteral stent placement with TURBT of his bladder mass. Pathology revealed Gleason 5+4=9 adenocarcinoma with 80% of the tissue involved. His Cr improved to 1.4 after stent placement. He completed his staging studies with a bone scan on 07/26/19 demonstrating multifocal bone metastases to the pelvis, thoracic spine, and left posterior ribs and CT imaging of the chest, abdomen, and pelvis with contrast revealing bilateral small pulmonary nodules, presacral and right pelvic lymphadenopathy, and iliac bone metastases. He has been started on  androgen deprivation. His PSA decreased to 1.28 on ADT.     He is currently anticoagulated and has had recurrent hematuria. He does have bilateral ureteral stents and has had a chronic resistant pseudomonas UTI. He has recently been on fosfomycin with his most recent culture obtained yesterday and pending.       PMH: He has a history of no major medical comorbidities.   PSH: No abdominal surgeries.     TNM stage: cT1c N1 M1b   PSA: 2.70   Gleason score: 5+4=9        ALLERGIES: azithromycin - Skin Rash  Oxycodone-Acetaminophen  penicillin - Swelling, rash  Sulfamethoxazole-Trimethoprim - Hives, rash       MEDICATIONS: Mobic 15 mg tablet 1 tablet PO Daily   Myrbetriq 50 mg tablet, extended release 24 hr 1 tablet PO Daily   Myrbetriq 50 mg tablet, extended release 24 hr 1 tablet PO Daily   Myrbetriq 50 mg tablet, extended release 24 hr 1 tablet PO Daily   Tamsulosin Hcl 0.4 mg capsule 1 capsule PO Daily   Xarelto 15 mg (42)- 20 mg (9) tablet, dose pack take one 15mg  BID for 21 days, then start 20mg  once daily   Xarelto 20 mg tablet 1 tablet PO Daily        GU PSH: Cystoscopy - 06/26/2019  Cystoscopy Insert Stent, Bilateral - 07/06/2019  Cystoscopy TURBT >5 cm - 07/06/2019  Locm 300-399Mg /Ml Iodine,1Ml - 07/26/2019         NON-GU PSH: No Non-GU PSH      GU PMH: Prostate  Cancer - 07/16/2019  Hydronephrosis - 07/09/2019  Bladder Cancer overlapping sites - 06/26/2019  Gross hematuria - 06/26/2019  Pelvic/perineal pain - 02/12/2019  Acute prostatitis - 02/07/2019  Chronic prostatitis - 12/19/2018       NON-GU PMH: Other specified disorders of muscle - 02/20/2019  Muscle weakness (generalized) - 02/12/2019  Other muscle spasm - 02/12/2019       FAMILY HISTORY: 2 sons - Son  hemochromatosis - Son  peritoneal cancer - Mother     Notes: antitrypsin disease- father     SOCIAL HISTORY: Marital Status: Married  Preferred Language: English; Ethnicity: Not Hispanic Or Latino; Race: White   Current Smoking Status: Patient has never smoked.     Tobacco Use Assessment Completed: Used Tobacco in last 30 days?  Does drink.   Drinks 1 caffeinated drink per day.       REVIEW OF SYSTEMS:     GU Review Male:   Patient denies frequent urination, hard to postpone urination, burning/ pain with urination, get up at night to urinate, leakage of urine, stream starts and stops, trouble starting your streams, and have to strain to urinate .   Gastrointestinal (Upper):   Patient denies nausea and vomiting.   Gastrointestinal (Lower):   Patient denies diarrhea and constipation.   Constitutional:   Patient denies fever, night sweats, weight loss, and fatigue.   Skin:   Patient denies skin rash/ lesion and itching.   Eyes:   Patient denies blurred vision and double vision.   Ears/ Nose/ Throat:   Patient denies sore throat and sinus problems.   Hematologic/Lymphatic:   Patient denies swollen glands and easy bruising.   Cardiovascular:   Patient denies leg swelling and chest pains.   Respiratory:   Patient denies cough and shortness of breath.   Endocrine:   Patient denies excessive thirst.   Musculoskeletal:   Patient denies back pain and joint pain.   Neurological:   Patient denies dizziness and headaches.   Psychologic:   Patient denies depression and anxiety.     PAST DATA REVIEWED:   Source Of History:  Patient   Lab Test Review:   PSA   Records Review:   Pathology Reports, Previous Patient Records   X-Ray Review: C.T. Chest/ Abd/Pelvis: Reviewed Films.   Bone Scan: Reviewed Films.        PROCEDURES: None     ASSESSMENT:       ICD-10 Details   1 GU:   Prostate Cancer - C61         PLAN:             Document  Letter(s):  Created for Patient: Clinical Summary            Notes:   1. Metastatic prostate cancer: He has talked with both Dr. Alen [name] and Dr. Tammi [name] earlier this morning. He has a good understanding of his situation and options for treatment and is inclined to  proceed with ongoing ADT and begin abiraterone as well. He fortunately is asymptomatic currently. He has follow up with Dr. Louis [name] and Dr. Alen [name]. I answered numerous questions about his prognosis and treatment related side effects today. He is very comfortable with proceeding as above.     2. Hematuria/UTI/ureteral obstruction: I reiterated what Dr. Louis [name] has told him and his plan as discussed in conference this morning. He will notify him of his most recent urine culture, consider IV antibiotics if necessary. If he still has persistent hematuria despite complete treatment  of his UTI, he will consider trying to remove his stents and possible palliative radiation to the prostate.     All questions were answered to his stated satisfaction. He will follow up with Dr. Louis [name].     CC: Dr. Burman [name]   Dr. Ria [name]   Dr. Zola Button   Dr. Tyler Pita.       E & M CODE: I spent at least 22 minutes face to face with the patient, more than 50% of that time was spent on counseling and/or coordinating care.

## 2019-08-20 ENCOUNTER — Telehealth: Payer: Self-pay

## 2019-08-20 DIAGNOSIS — N390 Urinary tract infection, site not specified: Secondary | ICD-10-CM | POA: Diagnosis not present

## 2019-08-20 MED ORDER — PREDNISONE 5 MG PO TABS: 5.0000 mg | ORAL_TABLET | Freq: Every day | ORAL | 1 refills | Status: DC

## 2019-08-20 NOTE — Telephone Encounter (Signed)
Patient called and stated Dr. Alen Blew sent Prednisone to wrong pharmacy. Prescription sent electronically to [facility] per patient request. [place] to cancel prior prescription sent for Prednisone.

## 2019-08-20 NOTE — Telephone Encounter (Signed)
Oral Oncology Patient Advocate Encounter  I met the patient in the lobby today, he signed the application and brought me a copay of his tax return.  I gave him 1 bottle of patient returned Zytiga. He stated he was going to pick up his prednisone today from [facility].  He asked if he could take Tylenol as needed with his Zytiga and I let him know I would pass that question along and someone would contact him.  I faxed the completed application today, 0/93.  This encounter will be updated until final determination.  [place] Patient [place] Phone [phone] Fax [phone] 08/20/2019   2:05 PM

## 2019-08-20 NOTE — Telephone Encounter (Signed)
Oral Oncology Patient Advocate Encounter  I met the patient in the lobby today and gave him a bottle of patient returned [place]. He stated he would pick up his prednisone from [name] today, I confirmed with Lanelle Bal that it has been sent there.  The patient stated he would start taking his Zytiga tomorrow, 9/14.  Wynetta Emery and [employer] patient assistance application has been faxed and the status of that will be updated in a separate encounter.  [place] Patient [place] Phone [phone] Fax [phone] 08/20/2019   2:08 PM

## 2019-08-21 ENCOUNTER — Telehealth: Payer: Self-pay | Admitting: Medical Oncology

## 2019-08-21 ENCOUNTER — Encounter: Payer: PPO | Admitting: Family Medicine

## 2019-08-21 DIAGNOSIS — N390 Urinary tract infection, site not specified: Secondary | ICD-10-CM | POA: Diagnosis not present

## 2019-08-21 NOTE — Telephone Encounter (Signed)
Followed up with Shane [name] regarding prednisone and Zytiga. He states Dr. Alen Blew sent prescription to [facility] instead of [name]. The prescription was transferred and he picked it up yesterday. He came yesterday to complete financial assistance forms and picked up a bottle of Zytiga. He will being medications today. He informed me, home health is coming out today to discuss IV antibiotics for his bladder infection. I asked him to call me with questions or concerns and he voiced understanding.

## 2019-08-27 DIAGNOSIS — N390 Urinary tract infection, site not specified: Secondary | ICD-10-CM | POA: Diagnosis not present

## 2019-08-28 NOTE — Telephone Encounter (Signed)
Oral Oncology Patient Advocate Encounter  [place] patient assistance application has been temporarily approved through 10/20/19 pending signature on the Medicare Attestation form stating that he will spend $1255.68. He will sign this at his appointment on 09/19/19.  The patient will also need to apply for Medicaid and be denied in order for his approval to be extended through 12/06/19.  I called the patient and gave him this information. He will go to [email or web address], create an account and apply for medicaid today. He will let me know the outcome of that application. He has received his first bottle of Zytiga from [place] [facility] [place].  The patient verbalized understanding and appreciation.  [place] Patient [place] Phone [phone] Fax [phone] 08/28/2019   9:39 AM

## 2019-08-29 DIAGNOSIS — N390 Urinary tract infection, site not specified: Secondary | ICD-10-CM | POA: Diagnosis not present

## 2019-08-31 NOTE — Progress Notes (Signed)
Counseling Intern Notes:  I spoke to the patient by phone on 08/31/19 after a referral from [facility] to check in on his needs for emotional support and to inform him about the support services available to him through [facility]. He reported "feeling fine" and that he is "doing the same things" he always does such as working in his yard and staying active. The patient stated he has gotten "a lot of information from [facility]" since his diagnosis and requested an email from me so he has a record of the services provided through [facility] Patient and Family Support and a way to contact me. I sent the patient an email today with the requested information.       The patient declined one-on-one counseling services at this time, but I will check-in with the patient in 3 weeks to offer emotional and mental health support at that time.    [facility] [place] Counseling Intern Voicemail:  [phone]

## 2019-09-18 ENCOUNTER — Other Ambulatory Visit: Payer: Self-pay | Admitting: Oncology

## 2019-09-19 ENCOUNTER — Inpatient Hospital Stay: Payer: PPO

## 2019-09-19 ENCOUNTER — Other Ambulatory Visit: Payer: Self-pay

## 2019-09-19 ENCOUNTER — Encounter: Payer: Self-pay | Admitting: Oncology

## 2019-09-19 ENCOUNTER — Inpatient Hospital Stay: Payer: PPO | Attending: Oncology | Admitting: Oncology

## 2019-09-19 VITALS — BP 144/69 | HR 67 | Temp 98.5°F | Resp 18 | Wt 184.1 lb

## 2019-09-19 DIAGNOSIS — C7951 Secondary malignant neoplasm of bone: Secondary | ICD-10-CM | POA: Diagnosis not present

## 2019-09-19 DIAGNOSIS — Z79899 Other long term (current) drug therapy: Secondary | ICD-10-CM | POA: Diagnosis not present

## 2019-09-19 DIAGNOSIS — Z86711 Personal history of pulmonary embolism: Secondary | ICD-10-CM | POA: Diagnosis not present

## 2019-09-19 DIAGNOSIS — Z7901 Long term (current) use of anticoagulants: Secondary | ICD-10-CM | POA: Insufficient documentation

## 2019-09-19 DIAGNOSIS — C61 Malignant neoplasm of prostate: Secondary | ICD-10-CM | POA: Diagnosis not present

## 2019-09-19 DIAGNOSIS — N1330 Unspecified hydronephrosis: Secondary | ICD-10-CM | POA: Diagnosis not present

## 2019-09-19 LAB — CBC WITH DIFFERENTIAL (CANCER CENTER ONLY)
Abs Immature Granulocytes: 0.22 10*3/uL — ABNORMAL HIGH (ref 0.00–0.07)
Basophils Absolute: 0 10*3/uL (ref 0.0–0.1)
Basophils Relative: 1 %
Eosinophils Absolute: 0.3 10*3/uL (ref 0.0–0.5)
Eosinophils Relative: 6 %
HCT: 31.5 % — ABNORMAL LOW (ref 39.0–52.0)
Hemoglobin: 10.2 g/dL — ABNORMAL LOW (ref 13.0–17.0)
Immature Granulocytes: 4 %
Lymphocytes Relative: 22 %
Lymphs Abs: 1.1 10*3/uL (ref 0.7–4.0)
MCH: 31 pg (ref 26.0–34.0)
MCHC: 32.4 g/dL (ref 30.0–36.0)
MCV: 95.7 fL (ref 80.0–100.0)
Monocytes Absolute: 0.6 10*3/uL (ref 0.1–1.0)
Monocytes Relative: 11 %
Neutro Abs: 2.8 10*3/uL (ref 1.7–7.7)
Neutrophils Relative %: 56 %
Platelet Count: 262 10*3/uL (ref 150–400)
RBC: 3.29 MIL/uL — ABNORMAL LOW (ref 4.22–5.81)
RDW: 12.2 % (ref 11.5–15.5)
WBC Count: 5.1 10*3/uL (ref 4.0–10.5)
nRBC: 0 % (ref 0.0–0.2)

## 2019-09-19 LAB — CMP (CANCER CENTER ONLY)
ALT: 16 U/L (ref 0–44)
AST: 17 U/L (ref 15–41)
Albumin: 3.5 g/dL (ref 3.5–5.0)
Alkaline Phosphatase: 66 U/L (ref 38–126)
Anion gap: 9 (ref 5–15)
BUN: 25 mg/dL — ABNORMAL HIGH (ref 8–23)
CO2: 26 mmol/L (ref 22–32)
Calcium: 9.8 mg/dL (ref 8.9–10.3)
Chloride: 108 mmol/L (ref 98–111)
Creatinine: 1.55 mg/dL — ABNORMAL HIGH (ref 0.61–1.24)
GFR, Est AFR Am: 52 mL/min — ABNORMAL LOW (ref >60)
GFR, Estimated: 45 mL/min — ABNORMAL LOW (ref >60)
Glucose, Bld: 95 mg/dL (ref 70–99)
Potassium: 4.3 mmol/L (ref 3.5–5.1)
Sodium: 143 mmol/L (ref 135–145)
Total Bilirubin: 0.5 mg/dL (ref 0.3–1.2)
Total Protein: 6.7 g/dL (ref 6.5–8.1)

## 2019-09-19 NOTE — Telephone Encounter (Signed)
Oral Oncology Patient Advocate Encounter  I met the patient in an exam room today to sign the medicare attestation form. He has not received the medicaid determination letter yet but I gave him my email and he will email me that information when he gets it in the mail.  This current application is approved through 10/20/19.  [place] Patient [place] Phone [phone] Fax [phone] 09/19/2019   11:27 AM

## 2019-09-19 NOTE — Progress Notes (Signed)
Hematology and Oncology Follow Up Visit  Shane [name] [id number] 01-Jun-1951 68 y.o. 09/19/2019 9:13 AM Shane [name], MDGutierrez, Shane Hatchet, MD   Principle Diagnosis: 68 year old man with advanced prostate cancer diagnosed in July 2020.  He was found to have a Gleason score of 4+4 = 9 with pelvic adenopathy as well as bone involvement.   Prior Therapy:  He is status post TURBT and stent placement bilaterally the pathology showed Gleason score 5+4 = 9 prostate cancer invading into the bladder.  Current therapy:   Androgen deprivation under the care of Dr. Louis [name].  Zytiga 1000 mg daily with prednisone 5 mg started in September 2020.  Interim History: [name] returns today for a follow-up visit.  Since the last visit, he started Zytiga with prednisone without any major complications.  He denies nausea, vomiting or abdominal pain.  He denies any worsening diarrhea or lower extremity edema.  His performance status and quality of life remain excellent.  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  He denies any bone pain or pathological fractures.  He does report periodic hematuria especially after manual at this time.  Patient denied any alteration mental status, neuropathy, confusion or dizziness.  Denies any headaches or lethargy.  Denies any night sweats, weight loss or changes in appetite.  Denied orthopnea, dyspnea on exertion or chest discomfort.  Denies shortness of breath, difficulty breathing hemoptysis or cough.  Denies any abdominal distention, nausea, early satiety or dyspepsia.  Denies any frequency, dysuria or nocturia.  Denies any skin irritation, dryness or rash.  Denies any ecchymosis or petechiae.  Denies any lymphadenopathy or clotting.  Denies any heat or cold intolerance.  Denies any anxiety or depression.  Remaining review of system is negative.        Medications: I have reviewed the patient's current medications.  Current Outpatient Medications   Medication Sig Dispense Refill  . Diphenhyd-Hydrocort-Nystatin (FIRST-DUKES MOUTHWASH) SUSP Use as directed 5-10 mLs in the mouth or throat 3 (three) times daily. (Patient not taking: Reported on 06/29/2019) 237 mL 0  . naproxen sodium (ALEVE) 220 MG tablet Take 220 mg by mouth 2 (two) times daily with a meal.    . predniSONE (DELTASONE) 5 MG tablet Take 1 tablet (5 mg total) by mouth daily with breakfast. 90 tablet 1  . Rivaroxaban (XARELTO) 15 MG TABS tablet Take 15 mg by mouth 2 (two) times daily with a meal.    . tamsulosin (FLOMAX) 0.4 MG CAPS capsule Take 0.4 mg by mouth daily.    . traMADol (ULTRAM) 50 MG tablet Take 1-2 tablets (50-100 mg total) by mouth every 6 (six) hours as needed for moderate pain. 15 tablet 0  . ZYTIGA 250 MG tablet TAKE 4 TABLETS BY MOUTH ONCE DAILY AS DIRECTED.  TAKE 1 HOUR BEFORE OR 2 HOURS AFTER A MEAL 120 tablet 10   No current facility-administered medications for this visit.      Allergies:  Allergies  Allergen Reactions  . Bactrim [Sulfamethoxazole-Trimethoprim] Hives    Red rash, hives  . Percocet [Oxycodone-Acetaminophen] Other (See Comments)    TURNS RED FROM CHEST UP, LOOKS LIKE SUNBURN AND STARTS TO [place] BADLY  . Penicillins Swelling and Rash    Did it involve swelling of the face/tongue/throat, SOB, or low BP? No Did it involve sudden or severe rash/hives, skin peeling, or any reaction on the inside of your mouth or nose? No Did you need to seek medical attention at a hospital or doctor's office? No When  did it last happen?50 years ago If all above answers are "NO", may proceed with cephalosporin use.   Marland Kitchen Zithromax [Azithromycin] Rash    Past Medical History, Surgical history, Social history, and Family History without any changes on review.    Physical Exam: Blood pressure (!) 144/69, pulse 67, temperature 98.5 F (36.9 C), temperature source Temporal, resp. rate 18, weight 184 lb 1.6 oz (83.5 kg), SpO2 100 %.   ECOG:  0     General appearance: Comfortable appearing without any discomfort Head: Normocephalic without any trauma Oropharynx: Mucous membranes are moist and pink without any thrush or ulcers. Eyes: Pupils are equal and round reactive to light. Lymph nodes: No cervical, supraclavicular, inguinal or axillary lymphadenopathy.   Heart:regular rate and rhythm.  S1 and S2 without leg edema. Lung: Clear without any rhonchi or wheezes.  No dullness to percussion. Abdomin: Soft, nontender, nondistended with good bowel sounds.  No hepatosplenomegaly. Musculoskeletal: No joint deformity or effusion.  Full range of motion noted. Neurological: No deficits noted on motor, sensory and deep tendon reflex exam. Skin: No petechial rash or dryness.  Appeared moist.  Psychiatric: Mood and affect appeared appropriate.    Lab Results: Lab Results  Component Value Date   WBC 5.1 09/19/2019   HGB 10.2 (L) 09/19/2019   HCT 31.5 (L) 09/19/2019   MCV 95.7 09/19/2019   PLT 262 09/19/2019     Chemistry      Component Value Date/Time   NA 141 07/06/2019 1341   K 4.0 07/06/2019 1341   CL 108 07/06/2019 1341   CO2 21 (L) 07/06/2019 1341   BUN 28 (H) 07/06/2019 1341   CREATININE 2.18 (H) 07/06/2019 1341      Component Value Date/Time   CALCIUM 9.1 07/06/2019 1341   ALKPHOS 42 08/03/2018 0740   AST 23 08/03/2018 0740   ALT 24 08/03/2018 0740   BILITOT 0.8 08/03/2018 0740       Impression and Plan:   68 year old with:  1.    Advanced prostate cancer with pelvic adenopathy and disease to the bone diagnosed in July 2020.  He presented with castration-sensitive and Gleason score of 5+4 = 9.   He is currently on Zytiga and prednisone with excellent tolerance at this time.  The natural course of this disease was reiterated and treatment options in the future were reviewed.  At this time he has tolerated therapy well and I recommended continuing Zytiga.  Alternative options including Darron Doom and  systemic chemotherapy will be deferred if he develops castration resistant disease.    2.  Pulmonary embolism: He continues to be on Xarelto without any major complications.  Will complete 3 months of therapy in the near future.  3.  Bilateral hydronephrosis: Related to his prostate cancer.  Bilateral stents are in place.  4.  Hematuria: Remains manageable at this time and related to Xarelto.  5.  Androgen deprivation: He continues to receive that under the care of Dr. Louis [name].  I recommended continuing this indefinitely.  6.  Follow-up: He will return in 2 months for repeat evaluation.  25  minutes was spent with the patient face-to-face today.  More than 50% of time was spent on updating his disease status, treatment options and addressing complications related to therapy.   Zola Button, MD 10/14/20209:13 AM

## 2019-09-20 ENCOUNTER — Telehealth: Payer: Self-pay

## 2019-09-20 ENCOUNTER — Telehealth: Payer: Self-pay | Admitting: Oncology

## 2019-09-20 LAB — PROSTATE-SPECIFIC AG, SERUM (LABCORP): Prostate Specific Ag, Serum: 0.8 ng/mL (ref 0.0–4.0)

## 2019-09-20 NOTE — Telephone Encounter (Signed)
Called patient and made him aware that his PSA is low at 0.8. Patient verbalized understanding.

## 2019-09-20 NOTE — Telephone Encounter (Signed)
Scheduled appt per 10/14 sch message.  Left a voice message of the appt date and time.

## 2019-09-20 NOTE — Telephone Encounter (Signed)
-----   Message from [name], MD sent at 09/20/2019  9:01 AM EDT ----- Please let him know his PSA is low

## 2019-09-27 ENCOUNTER — Telehealth: Payer: Self-pay | Admitting: Family Medicine

## 2019-09-27 NOTE — Telephone Encounter (Signed)
Patient stated that he had a pneumonia shot 2 years ago, he called today to see if he is due for a booster?

## 2019-09-27 NOTE — Telephone Encounter (Addendum)
He has completed his pneumonia shots.  No need for another.  How is doing with prostate cancer treatment and xarelto after PE?

## 2019-09-27 NOTE — Telephone Encounter (Signed)
Spoke with pt informing him he is up to date with pneumonia vaccine.  However, he states Dr. Darnell Level told him he would be due for a booster shot soon.  Pls advise.

## 2019-09-28 NOTE — Telephone Encounter (Signed)
Spoke with pt relaying Dr. Synthia Innocent message.  Verbalizes understanding.    States he's doing fine, for the most part.  He gets tired and hot easily.  Also, has some minor bleeding when he urinates.  But was told that was due to the Xarelto and having the tumor removed from his bladder.

## 2019-10-10 ENCOUNTER — Telehealth: Payer: Self-pay

## 2019-10-10 NOTE — Telephone Encounter (Signed)
Oral Oncology [name] Advocate Encounter  [place] [name] assistance will expire on 12/06/19.  I have a completed renewal application signed by the [name] and income documentation.  [employer] does want to see a Medicaid denial letter.  The application will be sent when the renewal period starts even if we have not received the medicaid denial letter and send that later.  I spoke to the [name] about this, he is going to call social services and see about getting the letter sent asap and will call when he has that.   Shane [name] [place] Phone [phone] Fax [phone] 10/10/2019   2:35 PM

## 2019-10-10 NOTE — Telephone Encounter (Signed)
Oral Oncology Patient Advocate Encounter  I spoke to [employer] today to ask for an extension on the Zytiga patient assistance since the patient has not received the letter from [facility] yet.  [employer] informed me that they only needed the Medicare attestation form at the moment to continue approval through 12/06/19.  I faxed that today and received a successful fax notification.  I called the patient and gave him this information, he verbalized understanding and great appreciation.  [facility] [place] Phone [phone] Fax [phone] 10/10/2019   2:30 PM

## 2019-10-12 NOTE — Telephone Encounter (Signed)
Oral Oncology [name] Advocate Encounter  The [name] received a letter from [facility] that I have attached to his completed JJPAF renewal application.  This application is ready to be sent when the renewal period starts.  Shane [name] [place] Phone [phone] Fax [phone] 10/12/2019   9:11 AM

## 2019-11-20 ENCOUNTER — Other Ambulatory Visit: Payer: Self-pay

## 2019-11-20 ENCOUNTER — Inpatient Hospital Stay: Payer: PPO | Attending: Oncology

## 2019-11-20 ENCOUNTER — Inpatient Hospital Stay (HOSPITAL_BASED_OUTPATIENT_CLINIC_OR_DEPARTMENT_OTHER): Payer: PPO | Admitting: Oncology

## 2019-11-20 VITALS — BP 157/76 | HR 63 | Temp 98.0°F | Resp 18 | Wt 186.3 lb

## 2019-11-20 DIAGNOSIS — C7951 Secondary malignant neoplasm of bone: Secondary | ICD-10-CM | POA: Diagnosis not present

## 2019-11-20 DIAGNOSIS — Z86711 Personal history of pulmonary embolism: Secondary | ICD-10-CM | POA: Diagnosis not present

## 2019-11-20 DIAGNOSIS — N1330 Unspecified hydronephrosis: Secondary | ICD-10-CM | POA: Diagnosis not present

## 2019-11-20 DIAGNOSIS — C61 Malignant neoplasm of prostate: Secondary | ICD-10-CM | POA: Diagnosis not present

## 2019-11-20 DIAGNOSIS — Z79899 Other long term (current) drug therapy: Secondary | ICD-10-CM | POA: Diagnosis not present

## 2019-11-20 DIAGNOSIS — N401 Enlarged prostate with lower urinary tract symptoms: Secondary | ICD-10-CM | POA: Diagnosis not present

## 2019-11-20 LAB — CBC WITH DIFFERENTIAL (CANCER CENTER ONLY)
Abs Immature Granulocytes: 0.05 10*3/uL (ref 0.00–0.07)
Basophils Absolute: 0 10*3/uL (ref 0.0–0.1)
Basophils Relative: 1 %
Eosinophils Absolute: 0.3 10*3/uL (ref 0.0–0.5)
Eosinophils Relative: 5 %
HCT: 33.1 % — ABNORMAL LOW (ref 39.0–52.0)
Hemoglobin: 10.7 g/dL — ABNORMAL LOW (ref 13.0–17.0)
Immature Granulocytes: 1 %
Lymphocytes Relative: 12 %
Lymphs Abs: 0.8 10*3/uL (ref 0.7–4.0)
MCH: 30.1 pg (ref 26.0–34.0)
MCHC: 32.3 g/dL (ref 30.0–36.0)
MCV: 93.2 fL (ref 80.0–100.0)
Monocytes Absolute: 0.6 10*3/uL (ref 0.1–1.0)
Monocytes Relative: 9 %
Neutro Abs: 4.7 10*3/uL (ref 1.7–7.7)
Neutrophils Relative %: 72 %
Platelet Count: 241 10*3/uL (ref 150–400)
RBC: 3.55 MIL/uL — ABNORMAL LOW (ref 4.22–5.81)
RDW: 11.8 % (ref 11.5–15.5)
WBC Count: 6.4 10*3/uL (ref 4.0–10.5)
nRBC: 0 % (ref 0.0–0.2)

## 2019-11-20 LAB — CMP (CANCER CENTER ONLY)
ALT: 16 U/L (ref 0–44)
AST: 22 U/L (ref 15–41)
Albumin: 3.6 g/dL (ref 3.5–5.0)
Alkaline Phosphatase: 60 U/L (ref 38–126)
Anion gap: 8 (ref 5–15)
BUN: 20 mg/dL (ref 8–23)
CO2: 25 mmol/L (ref 22–32)
Calcium: 9.3 mg/dL (ref 8.9–10.3)
Chloride: 107 mmol/L (ref 98–111)
Creatinine: 1.5 mg/dL — ABNORMAL HIGH (ref 0.61–1.24)
GFR, Est AFR Am: 54 mL/min — ABNORMAL LOW (ref >60)
GFR, Estimated: 47 mL/min — ABNORMAL LOW (ref >60)
Glucose, Bld: 98 mg/dL (ref 70–99)
Potassium: 3.8 mmol/L (ref 3.5–5.1)
Sodium: 140 mmol/L (ref 135–145)
Total Bilirubin: 0.5 mg/dL (ref 0.3–1.2)
Total Protein: 7 g/dL (ref 6.5–8.1)

## 2019-11-20 NOTE — Progress Notes (Signed)
Hematology and Oncology Follow Up Visit  Shane [name] [id number] 1951-02-10 68 y.o. 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  He presented with Gleason score of 4+4 = 9 at that time.   Prior Therapy:  He is status post TURBT and stent placement bilaterally the pathology showed Gleason score 5+4 = 9 prostate cancer invading into the bladder.  Current therapy:   Androgen deprivation under the care of Dr. [name].  Zytiga 1000 mg daily with prednisone 5 mg started in September 2020.  Interim History: Shane [name] presents today for repeat evaluation.  Since the last visit, he reports no major changes in his health.  He continues to tolerate Zytiga without any complaints.  He denies any nausea, fatigue or lower extremity edema.  Continues to eat well and has gained weight.  Denies any bone pain or pathological fractures.  Performance status quality of life remains excellent.   He denied headaches, blurry vision, syncope or seizures.  Denies any fevers, chills or sweats.  Denied chest pain, palpitation, orthopnea or leg edema.  Denied cough, wheezing or hemoptysis.  Denied nausea, vomiting or abdominal pain.  Denies any constipation or diarrhea.  Denies any frequency urgency or hesitancy.  Denies any arthralgias or myalgias.  Denies any skin rashes or lesions.  Denies any bleeding or clotting tendency.  Denies any easy bruising.  Denies any hair or nail changes.  Denies any anxiety or depression.  Remaining review of system is negative.           Medications: Unchanged on review. Current Outpatient Medications  Medication Sig Dispense Refill  . Diphenhyd-Hydrocort-Nystatin (FIRST-DUKES MOUTHWASH) SUSP Use as directed 5-10 mLs in the mouth or throat 3 (three) times daily. (Patient not taking: Reported on 06/29/2019) 237  mL 0  . naproxen sodium (ALEVE) 220 MG tablet Take 220 mg by mouth 2 (two) times daily with a meal.    . predniSONE (DELTASONE) 5 MG tablet Take 1 tablet (5 mg total) by mouth daily with breakfast. 90 tablet 1  . Rivaroxaban (XARELTO) 15 MG TABS tablet Take 15 mg by mouth 2 (two) times daily with a meal.    . tamsulosin (FLOMAX) 0.4 MG CAPS capsule Take 0.4 mg by mouth daily.    . traMADol (ULTRAM) 50 MG tablet Take 1-2 tablets (50-100 mg total) by mouth every 6 (six) hours as needed for moderate pain. (Patient not taking: Reported on 09/19/2019) 15 tablet 0  . ZYTIGA 250 MG tablet TAKE 4 TABLETS BY MOUTH ONCE DAILY AS DIRECTED.  TAKE 1 HOUR BEFORE OR 2 HOURS AFTER A MEAL 120 tablet 10   No current facility-administered medications for this visit.     Allergies:  Allergies  Allergen Reactions  . Bactrim [Sulfamethoxazole-Trimethoprim] Hives    Red rash, hives  . Percocet [Oxycodone-Acetaminophen] Other (See Comments)    TURNS RED FROM CHEST UP, LOOKS LIKE SUNBURN AND STARTS TO [place] BADLY  . Penicillins Swelling and Rash    Did it involve swelling of the face/tongue/throat, SOB, or low BP? No Did it involve sudden or severe rash/hives, skin peeling, or any reaction on the inside of your mouth or nose? No Did you need to seek medical attention at a hospital or doctor's office? No When did it last happen?50 years ago If all above answers are "NO", may proceed with cephalosporin use.   Marland Kitchen Zithromax [Azithromycin]  Rash    Past Medical History, Surgical history, Social history, and Family History without any changes on review.    Physical Exam:  Blood pressure (!) 157/76, pulse 63, temperature 98 F (36.7 C), resp. rate 18, weight 186 lb 4.8 oz (84.5 kg), SpO2 100 %.   ECOG: 0     General appearance: Alert, awake without any distress. Head: Atraumatic without abnormalities Oropharynx: Without any thrush or ulcers. Eyes: No scleral icterus. Lymph nodes: No  lymphadenopathy noted in the cervical, supraclavicular, or axillary nodes Heart:regular rate and rhythm, without any murmurs or gallops.   Lung: Clear to auscultation without any rhonchi, wheezes or dullness to percussion. Abdomin: Soft, nontender without any shifting dullness or ascites. Musculoskeletal: No clubbing or cyanosis. Neurological: No motor or sensory deficits. Skin: No rashes or lesions.      Lab Results: Lab Results  Component Value Date   WBC 5.1 09/19/2019   HGB 10.2 (L) 09/19/2019   HCT 31.5 (L) 09/19/2019   MCV 95.7 09/19/2019   PLT 262 09/19/2019     Chemistry      Component Value Date/Time   NA 143 09/19/2019 0901   K 4.3 09/19/2019 0901   CL 108 09/19/2019 0901   CO2 26 09/19/2019 0901   BUN 25 (H) 09/19/2019 0901   CREATININE 1.55 (H) 09/19/2019 0901      Component Value Date/Time   CALCIUM 9.8 09/19/2019 0901   ALKPHOS 66 09/19/2019 0901   AST 17 09/19/2019 0901   ALT 16 09/19/2019 0901   BILITOT 0.5 09/19/2019 0901     Results for Shane [name] (MRN [id number]) as of 11/20/2019 10:08  Ref. Range 09/19/2019 09:01  Prostate Specific Ag, Serum Latest Ref Range: 0.0 - 4.0 ng/mL 0.8    Impression and Plan:   68 year old with:  1.    Castration-sensitive prostate cancer with disease to the bone and lymphadenopathy diagnosed in July 2020.     He is currently on Zytiga which he has tolerated reasonably well without any complaints.  His PSA in October 2020 was 0.8 at that time.  Risks and benefits of continuing this therapy long-term complications including hypertension, adrenal insufficiency and hypokalemia were reviewed.  Alternative options were also discussed.  These include systemic chemotherapy and Xtandi among others.  He is agreeable to continue at this time.    2.  Pulmonary embolism: He has been on Xarelto to complete 3 months of therapy.  Therapy completed and is no longer on anticoagulation.  3.  Bilateral hydronephrosis: Follow  with urology regarding this issue with bilateral stents in place.  4.  Hematuria: Resolved at this time.  5.  Androgen deprivation: Long-term complication associated with androgen deprivation were reviewed.  He is to include osteoporosis, hot flashes and weight gain.  I recommended continuing those indefinitely.  6.  Follow-up: Will be in 2 months for repeat follow-up.  25  minutes was spent with the patient face-to-face today.  More than 50% of time was dedicated to updating his disease status, reviewing laboratory data as well as alternative treatment options.  Shane Button, MD 12/15/202010:06 AM

## 2019-11-21 ENCOUNTER — Telehealth: Payer: Self-pay

## 2019-11-21 ENCOUNTER — Telehealth: Payer: Self-pay | Admitting: Oncology

## 2019-11-21 LAB — PROSTATE-SPECIFIC AG, SERUM (LABCORP): Prostate Specific Ag, Serum: 0.2 ng/mL (ref 0.0–4.0)

## 2019-11-21 NOTE — Telephone Encounter (Signed)
-----   Message from [name], MD sent at 11/21/2019  9:11 AM EST ----- Please let him know his PSA is down.

## 2019-11-21 NOTE — Telephone Encounter (Signed)
Called patient and made him aware of PSA results. He verbalized understanding.

## 2019-11-21 NOTE — Telephone Encounter (Signed)
Scheduled appt per 12/15 los.  Spoke with pt and they are aware of the appt date and time.

## 2019-11-23 DIAGNOSIS — R310 Gross hematuria: Secondary | ICD-10-CM | POA: Diagnosis not present

## 2019-11-23 DIAGNOSIS — C61 Malignant neoplasm of prostate: Secondary | ICD-10-CM | POA: Diagnosis not present

## 2019-11-23 DIAGNOSIS — R8271 Bacteriuria: Secondary | ICD-10-CM | POA: Diagnosis not present

## 2019-12-04 ENCOUNTER — Telehealth: Payer: Self-pay | Admitting: Medical Oncology

## 2019-12-04 NOTE — Telephone Encounter (Signed)
Patient called stating he received a letter from his insurance company, denying payment of labs for 10/14. I informed him, I will pass this to our coding team to see if the correct code was used when billing. He thanked me. I left a voice mail for the coding team, requesting a return call to discuss.

## 2019-12-04 NOTE — Telephone Encounter (Signed)
Spoke with patient to inform him I spoke with coding department regarding denial for labs. Per Shane [name], the lab bill was paid 12/10 and there is a zero balance. Shane [name] thanked me for looking into this matter. I encouraged him to call me if he has other questions or concerns.

## 2019-12-11 ENCOUNTER — Telehealth: Payer: Self-pay

## 2019-12-11 NOTE — Telephone Encounter (Signed)
Oral Oncology Patient Advocate Encounter  Patient has been approved for copay assistance with [employer] (TAF).  [employer] will cover all copayment expenses for Zytiga for the remainder of the calendar year.    The billing information is as follows and has been shared with [place].   Member ID: [id number] Group ID: [id number] PCN: [id number] BIN: [id number] Eligibility Dates: 12/07/19 to 12/05/20  Fund: [place] Patient [place] Phone [phone] Fax [phone] 12/11/2019 11:58 AM

## 2019-12-13 ENCOUNTER — Telehealth: Payer: Self-pay | Admitting: Pharmacist

## 2019-12-13 DIAGNOSIS — C61 Malignant neoplasm of prostate: Secondary | ICD-10-CM

## 2019-12-13 MED ORDER — ABIRATERONE ACETATE 250 MG PO TABS: 1000.0000 mg | ORAL_TABLET | Freq: Every day | ORAL | 10 refills | Status: DC

## 2019-12-13 NOTE — Telephone Encounter (Signed)
Oral Oncology Pharmacist Encounter   Patient is switching from manufacturer assistance to filling at [facility]. Refill prescription for Zytiga sent to [facility].   Oral Oncology Clinic will continue to follow patient.   Darl Pikes, PharmD, BCPS, [facility] Hematology/Oncology Clinical Pharmacist [facility] [place] [facility] [phone]  12/13/2019 11:42 AM

## 2019-12-17 DIAGNOSIS — R8271 Bacteriuria: Secondary | ICD-10-CM | POA: Diagnosis not present

## 2019-12-18 ENCOUNTER — Encounter: Payer: Self-pay | Admitting: Family Medicine

## 2019-12-18 NOTE — Telephone Encounter (Signed)
Patient states he was contacted by Wynetta Emery and Wynetta Emery to let him know he was approved for patient assistance for his Zytiga until 12/05/20.  Patient will continue to receive Zytiga from [place] and [place].  [place] Patient [place] Phone [phone] Fax [phone] 12/18/2019 11:37 AM

## 2019-12-28 ENCOUNTER — Ambulatory Visit: Payer: PPO | Attending: Internal Medicine

## 2019-12-28 DIAGNOSIS — Z23 Encounter for immunization: Secondary | ICD-10-CM | POA: Insufficient documentation

## 2019-12-28 NOTE — Progress Notes (Signed)
   Covid-19 Vaccination Clinic  Name:  Martiniano Hue    MRN: [id number] DOB: 02/16/1951  12/28/2019  Mr. Brutus was observed post Covid-19 immunization for 15 minutes without incidence. He was provided with Vaccine Information Sheet and instruction to access the V-Safe system.   Mr. Mensing was instructed to call 911 with any severe reactions post vaccine: Marland Kitchen Difficulty breathing  . Swelling of your face and throat  . A fast heartbeat  . A bad rash all over your body  . Dizziness and weakness    Immunizations Administered    Name Date Dose VIS Date Route   Pfizer COVID-19 Vaccine 12/28/2019  8:40 AM 0.3 mL 11/16/2019 Intramuscular   Manufacturer: [place]   Lot: [id number]   [place]: [id number]

## 2019-12-31 ENCOUNTER — Telehealth: Payer: Self-pay

## 2019-12-31 DIAGNOSIS — C7951 Secondary malignant neoplasm of bone: Secondary | ICD-10-CM | POA: Insufficient documentation

## 2019-12-31 DIAGNOSIS — N390 Urinary tract infection, site not specified: Secondary | ICD-10-CM | POA: Insufficient documentation

## 2019-12-31 NOTE — Telephone Encounter (Signed)
COVID-19 Pre-Screening Questions:12/31/19  Do you currently have a fever (>100 F), chills or unexplained body aches? NO  Are you currently experiencing new cough, shortness of breath, sore throat, runny nose?NO  .  Have you recently travelled outside the state of [place] in the last 14 days? NO  .  Have you been in contact with someone that is currently pending confirmation of Covid19 testing or has been confirmed to have the [place] virus?  NO  **If the patient answers NO to ALL questions -  advise the patient to please call the clinic before coming to the office should any symptoms develop.

## 2020-01-01 ENCOUNTER — Encounter: Payer: Self-pay | Admitting: Internal Medicine

## 2020-01-01 ENCOUNTER — Ambulatory Visit (INDEPENDENT_AMBULATORY_CARE_PROVIDER_SITE_OTHER): Payer: PPO | Admitting: Internal Medicine

## 2020-01-01 ENCOUNTER — Other Ambulatory Visit: Payer: Self-pay

## 2020-01-01 ENCOUNTER — Telehealth: Payer: Self-pay | Admitting: *Deleted

## 2020-01-01 DIAGNOSIS — C7951 Secondary malignant neoplasm of bone: Secondary | ICD-10-CM | POA: Diagnosis not present

## 2020-01-01 DIAGNOSIS — N390 Urinary tract infection, site not specified: Secondary | ICD-10-CM | POA: Diagnosis not present

## 2020-01-01 MED ORDER — SODIUM CHLORIDE 0.9 % IV SOLN: 1.0000 g | Freq: Three times a day (TID) | INTRAVENOUS | Status: DC

## 2020-01-01 NOTE — Progress Notes (Signed)
[facility] for Infectious Disease  Reason for Consult: Possible persistent Pseudomonas UTI Referring Provider: Dr. [name]  Assessment: It is not entirely clear to me what is causing his persistent bladder irritation.  He says that he has had persistent, daily symptoms for the past 6 months unresponsive to any of his recent antibiotic combinations.  The fact that he did not have any improvement while on IV meropenem it makes me wonder if his symptoms are due to infection.  Nonetheless, I think it is worth treating him again for presumed Pseudomonas UTI.  I will treat with meropenem given his recent hives while on amoxicillin clavulanate.  Plan: 1. Repeat urine culture today 2. PICC placement 3. Meropenem 1 g IV every 8 hours for 14 days 4. Follow up here in 3 to 4 weeks  Patient Active Problem List   Diagnosis Date Noted  . Recurrent UTI 12/31/2019    Priority: High  . Bone metastases ([place]) 12/31/2019    Priority: Medium  . Malignant neoplasm of prostate ([place]) 08/17/2019    Priority: Medium  . Cancer involving bladder by direct extension from prostate ([place]) 06/28/2019    Priority: Medium  . HLD (hyperlipidemia) 08/02/2018  . Health maintenance examination 08/05/2017  . Anosmia 08/05/2017  . Family history of hemochromatosis 08/03/2016  . Medicare annual wellness visit, subsequent 07/29/2015  . Advanced care planning/counseling discussion 07/29/2015  . Bradycardia 07/29/2015  . History of cardiac monitoring 07/29/2015  . Syncopal episodes     Patient's Medications  New Prescriptions   MEROPENEM 1 G IN SODIUM CHLORIDE 0.9 % 100 ML    Inject 1 g into the vein every 8 (eight) hours.  Previous Medications   ABIRATERONE ACETATE (ZYTIGA) 250 MG TABLET    Take 4 tablets (1,000 mg total) by mouth daily. Take on an empty stomach 1 hour before or 2 hours after a meal   AMOXICILLIN-CLAVULANATE (AUGMENTIN) 875-125 MG TABLET    Take 1 tablet by mouth 2 (two) times  daily.   CIPROFLOXACIN (CIPRO) 500 MG TABLET    Take 500 mg by mouth 2 (two) times daily.   MELOXICAM (MOBIC) 15 MG TABLET    Take 15 mg by mouth daily.   MYRBETRIQ 50 MG TB24 TABLET    Take 50 mg by mouth daily.   NYSTATIN (MYCOSTATIN) 100000 UNIT/ML SUSPENSION       PREDNISONE (DELTASONE) 5 MG TABLET    Take 1 tablet (5 mg total) by mouth daily with breakfast.   TAMSULOSIN (FLOMAX) 0.4 MG CAPS CAPSULE    Take 0.4 mg by mouth daily.   VENLAFAXINE (EFFEXOR) 25 MG TABLET    Take 25 mg by mouth daily.  Modified Medications   No medications on file  Discontinued Medications   DIPHENHYD-HYDROCORT-NYSTATIN (FIRST-DUKES MOUTHWASH) SUSP    Use as directed 5-10 mLs in the mouth or throat 3 (three) times daily.   NAPROXEN SODIUM (ALEVE) 220 MG TABLET    Take 220 mg by mouth 2 (two) times daily with a meal.   RIVAROXABAN (XARELTO) 15 MG TABS TABLET    Take 15 mg by mouth 2 (two) times daily with a meal.   TRAMADOL (ULTRAM) 50 MG TABLET    Take 1-2 tablets (50-100 mg total) by mouth every 6 (six) hours as needed for moderate pain.    HPI: Shane [name] is a 69 y.o. male who developed irritative voiding symptoms 1 year ago.  He was treated for presumed prostatitis  without improvement he developed gross hematuria and eventually underwent cystoscopy which revealed markedly abnormal prostatic urethra with tumor extending up into the trigonal region of the bladder.  He was diagnosed with metastatic prostate cancer including bone metastases.  He underwent transurethral resection of the prostate and bladder tumor in July.  He had bilateral hydronephrosis at that time.  Bilateral ureteral stents were placed.    He has continued to have dysuria, frequency, hesitancy and intermittent hematuria.  He was treated with ciprofloxacin in August without improvement.  He received a 3 dose course of fosfomycin in late August without improvement. In mid September he was given a 2-week course of IV meropenem.  He tells me that  he did not notice any improvement.  He was treated with ciprofloxacin amoxicillin clavulanate together in mid December to manage his symptoms.  He did develop hives while on that combination.  Several urine cultures have grown multiple organisms.  His most recent urine culture on 12/21/2019 grew Pseudomonas susceptible only to IV antibiotics.  Review of Systems: Review of Systems  Constitutional: Positive for malaise/fatigue. Negative for chills, diaphoresis and fever.  HENT: Negative for congestion and sore throat.   Respiratory: Negative for cough and shortness of breath.   Cardiovascular: Negative for chest pain.  Gastrointestinal: Negative for abdominal pain, diarrhea, nausea and vomiting.  Genitourinary: Positive for dysuria, frequency, hematuria and urgency. Negative for flank pain.  Musculoskeletal: Negative for back pain and myalgias.  Skin: Positive for itching and rash.  Neurological: Negative for headaches.      Past Medical History:  Diagnosis Date  . Cancer involving bladder by direct extension from prostate ([place]) 06/28/2019   Cystoscopy [name]) - 2 bladder tumors, 1 on anterior prostate planned TURBT/TURP 06/2019 Biopsy - Gleason 5+4=9 prostate cancer [facility] started 07/2019 planned metastatic survey  . History of chicken pox   . Lipoma of neck   . Seasonal allergies   . Syncopal episodes 2007   x 2 s/p unrevealing hospitalization without recurrence    Social History   Tobacco Use  . Smoking status: Never Smoker  . Smokeless tobacco: Never Used  Substance Use Topics  . Alcohol use: Yes    Alcohol/week: 0.0 standard drinks    Comment: 1 beer most evenings  . Drug use: No    Family History  Problem Relation Age of Onset  . Hyperlipidemia Mother   . Cancer Mother        peritoneal cancer  . Hemochromatosis Father        s/p liver transplant - pt screened negative 07/2015  . Hemochromatosis Paternal Grandmother   . Hemochromatosis Paternal Uncle   . Stroke Neg  Hx   . Diabetes Neg Hx   . CAD Neg Hx    Allergies  Allergen Reactions  . Bactrim [Sulfamethoxazole-Trimethoprim] Hives    Red rash, hives  . Hydrocodone   . Percocet [Oxycodone-Acetaminophen] Other (See Comments)    TURNS RED FROM CHEST UP, LOOKS LIKE SUNBURN AND STARTS TO [place] BADLY  . Vancomycin   . Penicillins Swelling and Rash    Did it involve swelling of the face/tongue/throat, SOB, or low BP? No Did it involve sudden or severe rash/hives, skin peeling, or any reaction on the inside of your mouth or nose? No Did you need to seek medical attention at a hospital or doctor's office? No When did it last happen?50 years ago If all above answers are "NO", may proceed with cephalosporin use.   Marland Kitchen Zithromax [Azithromycin] Rash  OBJECTIVE: Vitals:   01/01/20 1353  BP: (!) 149/75  Pulse: 69  Temp: 98.5 F (36.9 C)  TempSrc: Oral  SpO2: 99%  Weight: 184 lb (83.5 kg)  Height: 5' 11.75" (1.822 m)   Body mass index is 25.13 kg/m.   Physical Exam Constitutional:      Comments: He is pleasant and talkative.  Cardiovascular:     Rate and Rhythm: Normal rate and regular rhythm.     Heart sounds: No murmur.  Pulmonary:     Effort: Pulmonary effort is normal.     Breath sounds: Normal breath sounds.  Abdominal:     General: There is no distension.     Palpations: Abdomen is soft.     Tenderness: There is no abdominal tenderness. There is no right CVA tenderness or left CVA tenderness.  Skin:    Findings: No rash.  Neurological:     General: No focal deficit present.  Psychiatric:        Mood and Affect: Mood normal.     Microbiology: No results found for this or any previous visit (from the past 240 hour(s)).  Michel Bickers, MD [facility] for [facility] [phone] pager   [phone] cell 01/01/2020, 2:29 PM

## 2020-01-01 NOTE — Telephone Encounter (Signed)
Patient has [employer]. Per [facility] pharmacy, it is extremely difficult to find nursing agencies that will accept this insurance coverage.  Patient states he previously had a nurse come to his home, insert a midline and infuse IV antibiotics.  Per the chart, it looks as if this was [place] out of [place], [place].  RN left message at [phone] for [place] on the intake line asking if they are able to provide pharmacy and infusion services again. Patient's PICC placement is pending.  Will follow. Landis Gandy, RN

## 2020-01-02 DIAGNOSIS — N390 Urinary tract infection, site not specified: Secondary | ICD-10-CM | POA: Diagnosis not present

## 2020-01-03 LAB — URINE CULTURE
MICRO NUMBER:: 10086630
SPECIMEN QUALITY:: ADEQUATE

## 2020-01-07 ENCOUNTER — Other Ambulatory Visit: Payer: Self-pay

## 2020-01-07 ENCOUNTER — Encounter (HOSPITAL_COMMUNITY)
Admission: RE | Admit: 2020-01-07 | Discharge: 2020-01-07 | Disposition: A | Discharge status: Home / Self Care | Payer: PPO | Source: Ambulatory Visit | Attending: Internal Medicine | Admitting: Internal Medicine

## 2020-01-07 ENCOUNTER — Other Ambulatory Visit: Payer: Self-pay | Admitting: Internal Medicine

## 2020-01-07 ENCOUNTER — Ambulatory Visit (HOSPITAL_COMMUNITY)
Admission: RE | Admit: 2020-01-07 | Discharge: 2020-01-07 | Disposition: A | Discharge status: Home / Self Care | Payer: PPO | Source: Ambulatory Visit | Attending: Internal Medicine | Admitting: Internal Medicine

## 2020-01-07 ENCOUNTER — Telehealth: Payer: Self-pay

## 2020-01-07 DIAGNOSIS — B965 Pseudomonas (aeruginosa) (mallei) (pseudomallei) as the cause of diseases classified elsewhere: Secondary | ICD-10-CM | POA: Diagnosis not present

## 2020-01-07 DIAGNOSIS — N390 Urinary tract infection, site not specified: Secondary | ICD-10-CM | POA: Diagnosis not present

## 2020-01-07 DIAGNOSIS — Z452 Encounter for adjustment and management of vascular access device: Secondary | ICD-10-CM | POA: Diagnosis not present

## 2020-01-07 IMAGING — US IR PICC >5YO
2 series · 2 of 2 positions shown · non-contrast
Comparison: none

INDICATION: Patient with history of persistent bladder infection, Pseudomonas
UTI. Request is made for peripherally inserted central venous
catheter.

[Series 1: ir picc >5yo · 1 of 1 slices shown]
[im 1/1]
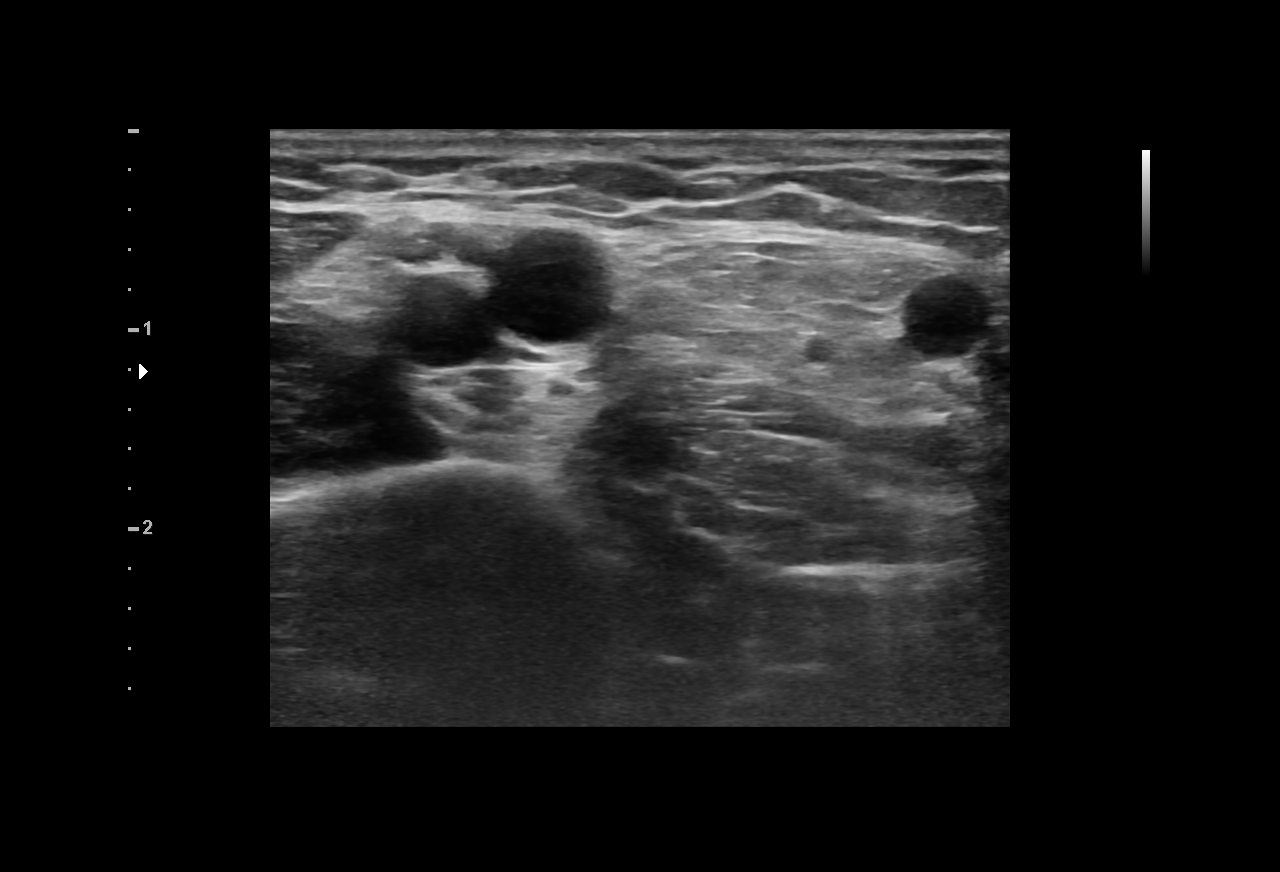

[Series 1: fl(-)  angio sharp · 1 of 1 slices shown]
[im 1/1]
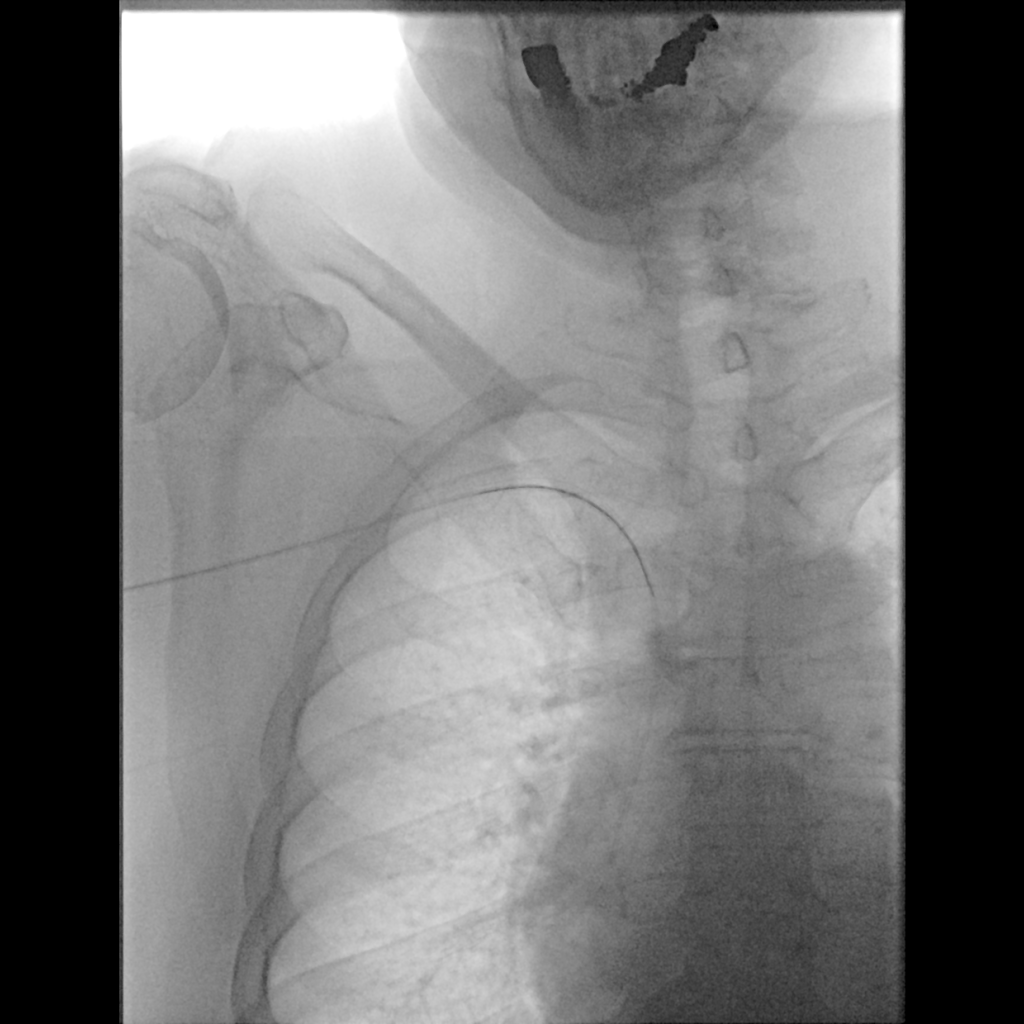

[2 of 2 positions shown; findings below may reference images not displayed]

EXAM:
PICC LINE PLACEMENT WITH ULTRASOUND AND FLUOROSCOPIC GUIDANCE

MEDICATIONS:
2 mL 1% lidocaine; The antibiotic was administered within an
appropriate time interval prior to skin puncture.

ANESTHESIA/SEDATION:
None

FLUOROSCOPY TIME:  Fluoroscopy Time: 20 seconds

COMPLICATIONS:
None immediate.

PROCEDURE:
The patient was advised of the possible risks and complications and
agreed to undergo the procedure. The patient was then brought to the
angiographic suite for the procedure.

The right arm was prepped with chlorhexidine, draped in the usual
sterile fashion using maximum barrier technique (cap and mask,
sterile gown, sterile gloves, large sterile sheet, hand hygiene and
cutaneous antisepsis) and infiltrated locally with 1% Lidocaine.

Ultrasound demonstrated patency of the right basilic vein, and this
was documented with an image. Under real-time ultrasound guidance,
this vein was accessed with a 21 gauge micropuncture needle and
image documentation was performed. A [DATE] wire was introduced in to
the vein. Over this, a single lumen right PICC was advanced to the
lower SVC/right atrial junction. Fluoroscopy during the procedure
and fluoro spot radiograph confirms appropriate catheter position.
The catheter was flushed and covered with a sterile dressing.

Catheter length: 40 cm
IMPRESSION: Successful right arm regular PICC line placement with ultrasound and
fluoroscopic guidance. The catheter is ready for use.

## 2020-01-07 MED ORDER — LIDOCAINE HCL 1 % IJ SOLN
INTRAMUSCULAR | Status: DC | PRN
  Administered 2020-01-07: 5 mL

## 2020-01-07 MED ORDER — HEPARIN SOD (PORK) LOCK FLUSH 100 UNIT/ML IV SOLN
INTRAVENOUS | Status: AC
  Administered 2020-01-07: 250 [IU]
  Filled 2020-01-07: qty 5

## 2020-01-07 MED ORDER — HEPARIN SOD (PORK) LOCK FLUSH 100 UNIT/ML IV SOLN: 250.0000 [IU] | Freq: Every day | INTRAVENOUS | Status: DC

## 2020-01-07 MED ORDER — HEPARIN SOD (PORK) LOCK FLUSH 100 UNIT/ML IV SOLN: 250.0000 [IU] | INTRAVENOUS | Status: DC | PRN

## 2020-01-07 MED ORDER — LIDOCAINE HCL 1 % IJ SOLN
INTRAMUSCULAR | Status: AC
  Filled 2020-01-07: qty 20

## 2020-01-07 MED ORDER — SODIUM CHLORIDE 0.9 % IV SOLN
1.0000 g | Freq: Once | INTRAVENOUS | Status: AC
  Administered 2020-01-07: 1 g via INTRAVENOUS
  Filled 2020-01-07: qty 1

## 2020-01-07 NOTE — Procedures (Signed)
Successful placement of single lumen PICC line to right basilic vein. Length 40cm Tip at lower SVC/RA PICC capped No complications Ready for use.  EBL < 5 mL   Docia Barrier PA-C 01/07/2020 12:29 PM

## 2020-01-07 NOTE — Telephone Encounter (Signed)
Please let them know that there are no interactions between meropenem and Myrbetriq or over-the-counter medications.  He can take them together.

## 2020-01-07 NOTE — Telephone Encounter (Signed)
Received voicemail in triage requesting advise regarding new IV antibiotic  meropenum and the effects it may have on OTC medications to treat sign and symptoms that may arise once patient received second covid-19 vaccine (tylenol for fevers) on 01/17/20. Wife of patient also want to know if meropenem will have any effects on myrbetriq. Wants to know if he should continue Myrbetriq while taking meropenem. Eugenia Mcalpine

## 2020-01-07 NOTE — Telephone Encounter (Signed)
[facility] able to take patient and coordinate nursing. Orders faxed, PICC placement 2/1, first dose at short stay after.  Patient aware 01/04/20. Landis Gandy, RN

## 2020-01-08 DIAGNOSIS — C7951 Secondary malignant neoplasm of bone: Secondary | ICD-10-CM | POA: Diagnosis not present

## 2020-01-08 DIAGNOSIS — N390 Urinary tract infection, site not specified: Secondary | ICD-10-CM | POA: Diagnosis not present

## 2020-01-08 DIAGNOSIS — Z792 Long term (current) use of antibiotics: Secondary | ICD-10-CM | POA: Diagnosis not present

## 2020-01-08 DIAGNOSIS — Z5181 Encounter for therapeutic drug level monitoring: Secondary | ICD-10-CM | POA: Diagnosis not present

## 2020-01-08 DIAGNOSIS — Z791 Long term (current) use of non-steroidal anti-inflammatories (NSAID): Secondary | ICD-10-CM | POA: Diagnosis not present

## 2020-01-08 DIAGNOSIS — Z7952 Long term (current) use of systemic steroids: Secondary | ICD-10-CM | POA: Diagnosis not present

## 2020-01-08 DIAGNOSIS — C61 Malignant neoplasm of prostate: Secondary | ICD-10-CM | POA: Diagnosis not present

## 2020-01-08 DIAGNOSIS — Z452 Encounter for adjustment and management of vascular access device: Secondary | ICD-10-CM | POA: Diagnosis not present

## 2020-01-08 DIAGNOSIS — R42 Dizziness and giddiness: Secondary | ICD-10-CM | POA: Diagnosis not present

## 2020-01-08 DIAGNOSIS — B965 Pseudomonas (aeruginosa) (mallei) (pseudomallei) as the cause of diseases classified elsewhere: Secondary | ICD-10-CM | POA: Diagnosis not present

## 2020-01-08 DIAGNOSIS — C7911 Secondary malignant neoplasm of bladder: Secondary | ICD-10-CM | POA: Diagnosis not present

## 2020-01-08 DIAGNOSIS — Z87440 Personal history of urinary (tract) infections: Secondary | ICD-10-CM | POA: Diagnosis not present

## 2020-01-08 DIAGNOSIS — E785 Hyperlipidemia, unspecified: Secondary | ICD-10-CM | POA: Diagnosis not present

## 2020-01-08 LAB — BASIC METABOLIC PANEL
Creatinine: 1.2 (ref 0.6–1.3)
Glucose: 113
Potassium: 3.7 (ref 3.4–5.3)
Sodium: 141 (ref 137–147)

## 2020-01-08 LAB — COMPREHENSIVE METABOLIC PANEL
Albumin: 3.1 — AB (ref 3.5–5.0)
Calcium: 7.9 — AB (ref 8.7–10.7)

## 2020-01-08 LAB — HEPATIC FUNCTION PANEL
ALT: 11 (ref 10–40)
AST: 16 (ref 14–40)
Alkaline Phosphatase: 47 (ref 25–125)
Bilirubin, Total: 0.3

## 2020-01-08 NOTE — Telephone Encounter (Signed)
When calling the patient [facility] nurse answer the phone, Mardene Celeste with [facility] requesting verbal order for picc care and medication education.  Verbal order given for picc care per protocol and medication education.  After speaking with the nurse patient came to the phone and made aware that he can take all medication together. No interaction between meropenem and mybetriq and OTC medications.  Patient appreciative of call. Eugenia Mcalpine

## 2020-01-09 LAB — CBC AND DIFFERENTIAL
Hemoglobin: 10.4 — AB (ref 13.5–17.5)
Platelets: 240 (ref 150–399)
WBC: 6.3

## 2020-01-10 ENCOUNTER — Telehealth: Payer: Self-pay | Admitting: Family Medicine

## 2020-01-10 ENCOUNTER — Encounter: Payer: Self-pay | Admitting: Family Medicine

## 2020-01-10 NOTE — Telephone Encounter (Signed)
Spoke with pt relaying Dr. Synthia Innocent message.  Pt verbalizes understanding.  States his appetite is good, although, he lost sense of taste with recent abx he was taking.  Everything is back to normal but he'll look into a protein supplement.  FYI to Dr. Darnell Level.

## 2020-01-10 NOTE — Telephone Encounter (Signed)
Received recent labs from home health agency -  Stable anemia  Kidney function actually improved Protein levels low - how is appetite? If having trouble getting regular protein in diet, would suggest adding protein supplement like boost or ensure.

## 2020-01-14 DIAGNOSIS — N390 Urinary tract infection, site not specified: Secondary | ICD-10-CM | POA: Diagnosis not present

## 2020-01-17 ENCOUNTER — Ambulatory Visit: Payer: PPO | Attending: Internal Medicine

## 2020-01-17 DIAGNOSIS — Z23 Encounter for immunization: Secondary | ICD-10-CM

## 2020-01-17 NOTE — Progress Notes (Signed)
   Covid-19 Vaccination Clinic  Name:  Sayf Lanzer    MRN: [id number] DOB: 1951/10/02  01/17/2020  Mr. Hlavaty was observed post Covid-19 immunization for 15 minutes without incidence. He was provided with Vaccine Information Sheet and instruction to access the V-Safe system.   Mr. Zenk was instructed to call 911 with any severe reactions post vaccine: Marland Kitchen Difficulty breathing  . Swelling of your face and throat  . A fast heartbeat  . A bad rash all over your body  . Dizziness and weakness    Immunizations Administered    Name Date Dose VIS Date Route   Pfizer COVID-19 Vaccine 01/17/2020  2:17 PM 0.3 mL 11/16/2019 Intramuscular   Manufacturer: [employer]   Lot: [id number]   [place]: [id number]

## 2020-01-18 DIAGNOSIS — N390 Urinary tract infection, site not specified: Secondary | ICD-10-CM | POA: Diagnosis not present

## 2020-01-18 DIAGNOSIS — B965 Pseudomonas (aeruginosa) (mallei) (pseudomallei) as the cause of diseases classified elsewhere: Secondary | ICD-10-CM | POA: Diagnosis not present

## 2020-01-21 ENCOUNTER — Telehealth: Payer: Self-pay

## 2020-01-21 ENCOUNTER — Encounter: Payer: Self-pay | Admitting: Family Medicine

## 2020-01-21 NOTE — Telephone Encounter (Signed)
Verbal orders given to Shane [name] per Dr. Megan Salon remove picc line now. Verbal orders read back and understood.  Shane [name]

## 2020-01-21 NOTE — Telephone Encounter (Signed)
Received call from [facility] requesting pull picc orders. Patient has completed 14 day course of IV meropenem. Routing to provider. Eugenia Mcalpine

## 2020-01-21 NOTE — Telephone Encounter (Signed)
Please have them remove the PICC now.  Thanks.

## 2020-01-22 DIAGNOSIS — N390 Urinary tract infection, site not specified: Secondary | ICD-10-CM | POA: Diagnosis not present

## 2020-01-23 ENCOUNTER — Other Ambulatory Visit: Payer: Self-pay

## 2020-01-23 ENCOUNTER — Other Ambulatory Visit: Payer: Self-pay | Admitting: Internal Medicine

## 2020-01-23 ENCOUNTER — Telehealth: Payer: Self-pay

## 2020-01-23 DIAGNOSIS — N390 Urinary tract infection, site not specified: Secondary | ICD-10-CM

## 2020-01-23 MED ORDER — MAGIC MOUTHWASH: 5.0000 mL | Freq: Three times a day (TID) | ORAL | 0 refills | Status: DC | PRN

## 2020-01-23 NOTE — Telephone Encounter (Signed)
Patient called office today requesting prescription for Magic Mouth Wash. States he has had loss of taste since late December.  States that Dr. Danise Mina prescribed this to him before and it helped. Dr. Danise Mina is not in office today and would like to know if MD could send in prescription.  Is using [facility] in [place].  [place]

## 2020-01-23 NOTE — Telephone Encounter (Signed)
Prescription sent

## 2020-01-30 ENCOUNTER — Telehealth: Payer: Self-pay

## 2020-01-30 NOTE — Telephone Encounter (Signed)
COVID-19 Pre-Screening Questions:01/30/20  Do you currently have a fever (>100 F), chills or unexplained body aches? NO  Are you currently experiencing new cough, shortness of breath, sore throat, runny nose? NO .  Have you recently travelled outside the state of [place] in the last 14 days?NO .  Have you been in contact with someone that is currently pending confirmation of Covid19 testing or has been confirmed to have the [place] virus? NO  **If the patient answers NO to ALL questions -  advise the patient to please call the clinic before coming to the office should any symptoms develop.

## 2020-01-31 ENCOUNTER — Other Ambulatory Visit: Payer: Self-pay

## 2020-01-31 ENCOUNTER — Encounter: Payer: Self-pay | Admitting: Internal Medicine

## 2020-01-31 ENCOUNTER — Ambulatory Visit (INDEPENDENT_AMBULATORY_CARE_PROVIDER_SITE_OTHER): Payer: PPO | Admitting: Internal Medicine

## 2020-01-31 DIAGNOSIS — N390 Urinary tract infection, site not specified: Secondary | ICD-10-CM

## 2020-01-31 DIAGNOSIS — R432 Parageusia: Secondary | ICD-10-CM | POA: Insufficient documentation

## 2020-01-31 NOTE — Assessment & Plan Note (Signed)
I am not sure what caused his loss of taste.  I certainly doubt that it is related to urinary tract infection or his recent antibiotic therapy.  He may have had Covid infection in December.  Testing is unlikely to be helpful at this point.

## 2020-01-31 NOTE — Assessment & Plan Note (Signed)
He has had partial improvement with treatment for probable Pseudomonas UTI.  I will repeat his urinalysis and urine culture today and see him back in 4 weeks.

## 2020-01-31 NOTE — Addendum Note (Signed)
Addended by: Michel Bickers on: 01/31/2020 12:05 PM   Modules accepted: Orders

## 2020-01-31 NOTE — Progress Notes (Signed)
[facility] for Infectious Disease  Patient Active Problem List   Diagnosis Date Noted  . Recurrent UTI 12/31/2019    Priority: High  . Bone metastases ([facility]) 12/31/2019    Priority: Medium  . Malignant neoplasm of prostate ([place]) 08/17/2019    Priority: Medium  . Cancer involving bladder by direct extension from prostate ([place]) 06/28/2019    Priority: Medium  . Loss of taste 01/31/2020  . HLD (hyperlipidemia) 08/02/2018  . Health maintenance examination 08/05/2017  . Anosmia 08/05/2017  . Family history of hemochromatosis 08/03/2016  . Medicare annual wellness visit, subsequent 07/29/2015  . Advanced care planning/counseling discussion 07/29/2015  . Bradycardia 07/29/2015  . History of cardiac monitoring 07/29/2015  . Syncopal episodes     Patient's Medications  New Prescriptions   No medications on file  Previous Medications   ABIRATERONE ACETATE (ZYTIGA) 250 MG TABLET    Take 4 tablets (1,000 mg total) by mouth daily. Take on an empty stomach 1 hour before or 2 hours after a meal   AMOXICILLIN-CLAVULANATE (AUGMENTIN) 875-125 MG TABLET    Take 1 tablet by mouth 2 (two) times daily.   CIPROFLOXACIN (CIPRO) 500 MG TABLET    Take 500 mg by mouth 2 (two) times daily.   MAGIC MOUTHWASH SOLN    Take 5 mLs by mouth 3 (three) times daily as needed for mouth pain.   MELOXICAM (MOBIC) 15 MG TABLET    Take 15 mg by mouth daily.   MEROPENEM 1 G IN SODIUM CHLORIDE 0.9 % 100 ML    Inject 1 g into the vein every 8 (eight) hours.   MYRBETRIQ 50 MG TB24 TABLET    Take 50 mg by mouth daily.   NYSTATIN (MYCOSTATIN) 100000 UNIT/ML SUSPENSION       PREDNISONE (DELTASONE) 5 MG TABLET    Take 1 tablet (5 mg total) by mouth daily with breakfast.   TAMSULOSIN (FLOMAX) 0.4 MG CAPS CAPSULE    Take 0.4 mg by mouth daily.   VENLAFAXINE (EFFEXOR) 25 MG TABLET    Take 25 mg by mouth daily.  Modified Medications   No medications on file  Discontinued Medications   No medications on file     Subjective: Shane [name] is in for his routine follow-up visit.  He developed irritative voiding symptoms 1 year ago.  He was treated for presumed prostatitis without improvement he developed gross hematuria and eventually underwent cystoscopy which revealed markedly abnormal prostatic urethra with tumor extending up into the trigonal region of the bladder.  He was diagnosed with metastatic prostate cancer including bone metastases.  He underwent transurethral resection of the prostate and bladder tumor in July.  He had bilateral hydronephrosis at that time.  Bilateral ureteral stents were placed.    He has continued to have dysuria, frequency, hesitancy and intermittent hematuria.  He was treated with ciprofloxacin in August without improvement.  He received a 3 dose course of fosfomycin in late August without improvement. In mid September he was given a 2-week course of IV meropenem.  He tells me that he did not notice any improvement.  He was treated with ciprofloxacin amoxicillin clavulanate together in mid December to manage his symptoms.  He did develop hives while on that combination.  Several urine cultures have grown multiple organisms.  His most recent urine culture on 12/21/2019 grew Pseudomonas susceptible only to IV antibiotics.  I elected to treat him with IV meropenem.  He had a PICC  placed and was treated from 01/07/2020-01/20/2020.  He says that he did improve while on meropenem.  He says that he no longer sees blood in his urine.  His dysuria improved about 50%.  He now tells me that the thing that is bothering him more than anything is that he lost his sense of taste in late December.  This makes it very difficult for him to eat.  He does not suffer from dry mouth.  He says that he lost essentially smell years ago.  He does not think that he had COVID-19 infection.  He received his 2 vaccines last month after he lost his sense of taste.  Review of Systems: Review of Systems   Constitutional: Negative for fever.  HENT:       As noted in HPI.  Gastrointestinal: Negative for abdominal pain, diarrhea, nausea and vomiting.  Genitourinary: Positive for dysuria and frequency. Negative for hematuria and urgency.  Skin: Negative for rash.    Past Medical History:  Diagnosis Date  . Cancer involving bladder by direct extension from prostate ([place]) 06/28/2019   Cystoscopy Shane [name]) - 2 bladder tumors, 1 on anterior prostate planned TURBT/TURP 06/2019 Biopsy - Gleason 5+4=9 prostate cancer Firmagon started 07/2019 planned metastatic survey  . History of chicken pox   . Lipoma of neck   . Seasonal allergies   . Syncopal episodes 2007   x 2 s/p unrevealing hospitalization without recurrence    Social History   Tobacco Use  . Smoking status: Never Smoker  . Smokeless tobacco: Never Used  Substance Use Topics  . Alcohol use: Yes    Alcohol/week: 0.0 standard drinks    Comment: 1 beer most evenings  . Drug use: No    Family History  Problem Relation Age of Onset  . Hyperlipidemia Mother   . Cancer Mother        peritoneal cancer  . Hemochromatosis Father        s/p liver transplant - pt screened negative 07/2015  . Hemochromatosis Paternal Grandmother   . Hemochromatosis Paternal Uncle   . Stroke Neg Hx   . Diabetes Neg Hx   . CAD Neg Hx     Allergies  Allergen Reactions  . Bactrim [Sulfamethoxazole-Trimethoprim] Hives    Red rash, hives  . Hydrocodone   . Percocet [Oxycodone-Acetaminophen] Other (See Comments)    TURNS RED FROM CHEST UP, LOOKS LIKE SUNBURN AND STARTS TO [place] BADLY  . Vancomycin   . Penicillins Swelling and Rash    Did it involve swelling of the face/tongue/throat, SOB, or low BP? No Did it involve sudden or severe rash/hives, skin peeling, or any reaction on the inside of your mouth or nose? No Did you need to seek medical attention at a hospital or doctor's office? No When did it last happen?50 years ago If all above answers  are "NO", may proceed with cephalosporin use.   . Zithromax [Azithromycin] Rash    Objective: Vitals:   01/31/20 0841  BP: (!) 149/74  Pulse: 65  Temp: 98.4 F (36.9 C)  TempSrc: Oral  SpO2: 100%  Weight: 180 lb (81.6 kg)  Height: 5' 11.5" (1.816 m)   Body mass index is 24.76 kg/m.  Physical Exam Constitutional:      Comments: He is pleasant and in no distress.  HENT:     Mouth/Throat:     Comments: He has no oral lesions.  His mucous membranes are moist. Abdominal:     Palpations: Abdomen is soft.  Tenderness: There is no abdominal tenderness. There is no right CVA tenderness or left CVA tenderness.  Psychiatric:        Mood and Affect: Mood normal.     Lab Results    Problem List Items Addressed This Visit      High   Recurrent UTI    He has had partial improvement with treatment for probable Pseudomonas UTI.  I will repeat his urinalysis and urine culture today and see him back in 4 weeks.      Relevant Orders   Urinalysis, Routine w reflex microscopic   Urine Culture     Unprioritized   Loss of taste    I am not sure what caused his loss of taste.  I certainly doubt that it is related to urinary tract infection or his recent antibiotic therapy.  He may have had Covid infection in December.  Testing is unlikely to be helpful at this point.          Michel Bickers, MD [facility] for Infect[facility] [place] [facility] [phone] pager   [phone] cell 01/31/2020, 9:07 AM

## 2020-02-02 LAB — URINALYSIS, ROUTINE W REFLEX MICROSCOPIC
Bacteria, UA: NONE SEEN /HPF
Bilirubin Urine: NEGATIVE
Glucose, UA: NEGATIVE
Hyaline Cast: NONE SEEN /LPF
Ketones, ur: NEGATIVE
Nitrite: POSITIVE — AB
Specific Gravity, Urine: 1.02 (ref 1.001–1.03)
pH: 6.5 (ref 5.0–8.0)

## 2020-02-02 LAB — URINE CULTURE
MICRO NUMBER:: 10191889
SPECIMEN QUALITY:: ADEQUATE

## 2020-02-04 NOTE — Telephone Encounter (Signed)
I called and spoke with Shane [name] this morning.  His urinalysis shows ongoing pyuria and hematuria.  He says that he has started seeing a little bit more blood in his urine.  He still has burning, suprapubic pain and dysuria.  He says that he uses a heating pad on his groin and that helps with the suprapubic pain.  He says that he can certainly tolerate his symptoms now.  He is going to discuss whether or not the ureteral stents can be removed with his urologist, Dr. [name].

## 2020-02-05 ENCOUNTER — Other Ambulatory Visit: Payer: Self-pay | Admitting: Oncology

## 2020-02-12 DIAGNOSIS — N130 Hydronephrosis with ureteropelvic junction obstruction: Secondary | ICD-10-CM | POA: Diagnosis not present

## 2020-02-12 DIAGNOSIS — C778 Secondary and unspecified malignant neoplasm of lymph nodes of multiple regions: Secondary | ICD-10-CM | POA: Diagnosis not present

## 2020-02-12 DIAGNOSIS — C7951 Secondary malignant neoplasm of bone: Secondary | ICD-10-CM | POA: Diagnosis not present

## 2020-02-12 DIAGNOSIS — C61 Malignant neoplasm of prostate: Secondary | ICD-10-CM | POA: Diagnosis not present

## 2020-02-13 ENCOUNTER — Other Ambulatory Visit: Payer: Self-pay

## 2020-02-13 ENCOUNTER — Observation Stay (HOSPITAL_COMMUNITY): Payer: PPO

## 2020-02-13 ENCOUNTER — Encounter (HOSPITAL_COMMUNITY): Payer: Self-pay | Admitting: Urology

## 2020-02-13 ENCOUNTER — Inpatient Hospital Stay (HOSPITAL_COMMUNITY)
Admission: AD | Admit: 2020-02-13 | Discharge: 2020-02-15 | DRG: 690 | Disposition: A | Discharge status: Home Health | Payer: PPO | Source: Ambulatory Visit | Attending: Urology | Admitting: Urology

## 2020-02-13 ENCOUNTER — Observation Stay: Payer: Self-pay

## 2020-02-13 DIAGNOSIS — N12 Tubulo-interstitial nephritis, not specified as acute or chronic: Secondary | ICD-10-CM | POA: Diagnosis not present

## 2020-02-13 DIAGNOSIS — R110 Nausea: Secondary | ICD-10-CM | POA: Diagnosis not present

## 2020-02-13 DIAGNOSIS — N130 Hydronephrosis with ureteropelvic junction obstruction: Secondary | ICD-10-CM | POA: Diagnosis not present

## 2020-02-13 DIAGNOSIS — Z20822 Contact with and (suspected) exposure to covid-19: Secondary | ICD-10-CM | POA: Diagnosis present

## 2020-02-13 DIAGNOSIS — C7951 Secondary malignant neoplasm of bone: Secondary | ICD-10-CM | POA: Diagnosis present

## 2020-02-13 DIAGNOSIS — Z79899 Other long term (current) drug therapy: Secondary | ICD-10-CM

## 2020-02-13 DIAGNOSIS — N419 Inflammatory disease of prostate, unspecified: Secondary | ICD-10-CM | POA: Diagnosis present

## 2020-02-13 DIAGNOSIS — Z8349 Family history of other endocrine, nutritional and metabolic diseases: Secondary | ICD-10-CM | POA: Diagnosis not present

## 2020-02-13 DIAGNOSIS — N131 Hydronephrosis with ureteral stricture, not elsewhere classified: Secondary | ICD-10-CM | POA: Diagnosis present

## 2020-02-13 DIAGNOSIS — Z1624 Resistance to multiple antibiotics: Secondary | ICD-10-CM | POA: Diagnosis present

## 2020-02-13 DIAGNOSIS — B965 Pseudomonas (aeruginosa) (mallei) (pseudomallei) as the cause of diseases classified elsewhere: Secondary | ICD-10-CM | POA: Diagnosis present

## 2020-02-13 DIAGNOSIS — Z808 Family history of malignant neoplasm of other organs or systems: Secondary | ICD-10-CM

## 2020-02-13 DIAGNOSIS — R319 Hematuria, unspecified: Secondary | ICD-10-CM | POA: Diagnosis present

## 2020-02-13 DIAGNOSIS — R430 Anosmia: Secondary | ICD-10-CM | POA: Diagnosis present

## 2020-02-13 DIAGNOSIS — Z885 Allergy status to narcotic agent status: Secondary | ICD-10-CM

## 2020-02-13 DIAGNOSIS — Z881 Allergy status to other antibiotic agents status: Secondary | ICD-10-CM

## 2020-02-13 DIAGNOSIS — N1330 Unspecified hydronephrosis: Secondary | ICD-10-CM

## 2020-02-13 DIAGNOSIS — Z936 Other artificial openings of urinary tract status: Secondary | ICD-10-CM | POA: Diagnosis not present

## 2020-02-13 DIAGNOSIS — C679 Malignant neoplasm of bladder, unspecified: Secondary | ICD-10-CM | POA: Diagnosis present

## 2020-02-13 DIAGNOSIS — E785 Hyperlipidemia, unspecified: Secondary | ICD-10-CM | POA: Diagnosis present

## 2020-02-13 DIAGNOSIS — N136 Pyonephrosis: Principal | ICD-10-CM | POA: Diagnosis present

## 2020-02-13 DIAGNOSIS — C61 Malignant neoplasm of prostate: Secondary | ICD-10-CM | POA: Diagnosis present

## 2020-02-13 DIAGNOSIS — N139 Obstructive and reflux uropathy, unspecified: Secondary | ICD-10-CM | POA: Diagnosis not present

## 2020-02-13 DIAGNOSIS — C7911 Secondary malignant neoplasm of bladder: Secondary | ICD-10-CM | POA: Diagnosis not present

## 2020-02-13 DIAGNOSIS — Z791 Long term (current) use of non-steroidal anti-inflammatories (NSAID): Secondary | ICD-10-CM

## 2020-02-13 DIAGNOSIS — R509 Fever, unspecified: Secondary | ICD-10-CM | POA: Diagnosis not present

## 2020-02-13 DIAGNOSIS — N390 Urinary tract infection, site not specified: Secondary | ICD-10-CM | POA: Diagnosis not present

## 2020-02-13 DIAGNOSIS — R310 Gross hematuria: Secondary | ICD-10-CM | POA: Diagnosis not present

## 2020-02-13 DIAGNOSIS — Z880 Allergy status to penicillin: Secondary | ICD-10-CM | POA: Diagnosis not present

## 2020-02-13 LAB — TYPE AND SCREEN
ABO/RH(D): O POS
Antibody Screen: NEGATIVE

## 2020-02-13 LAB — PROTIME-INR
INR: 1.2 (ref 0.8–1.2)
Prothrombin Time: 14.6 seconds (ref 11.4–15.2)

## 2020-02-13 LAB — ABO/RH: ABO/RH(D): O POS

## 2020-02-13 IMAGING — CT CT ABD-PELV W/O CM
2 of 4 series · 15 of 46 positions shown, 17 images · non-contrast
Comparison: Renal ultrasound of [DATE] and CT abdomen from
[DATE]

CLINICAL DATA: Hydronephrosis. Nausea and vomiting. History of
bladder cancer. Hematuria.

EXAM:
CT ABDOMEN AND PELVIS WITHOUT CONTRAST
TECHNIQUE: Multidetector CT imaging of the abdomen and pelvis was performed
following the standard protocol without IV contrast.

[Series 2: axial st · axial · 0.74mm/px · z∈[-439,-14]mm · 12 of 97 slices shown, 14 images]
[im 6/97  soft-tissue]
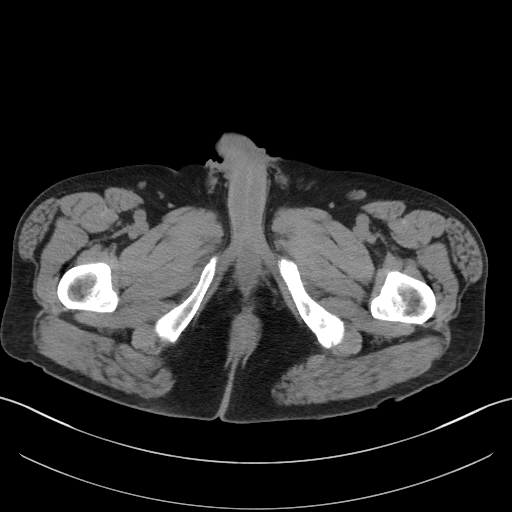
[im 6/97  bone]
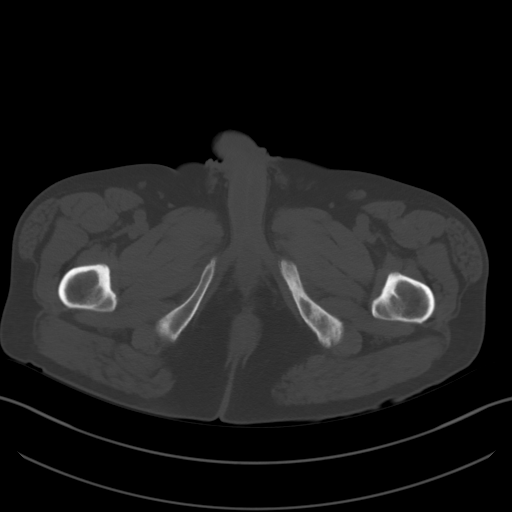
[im 16/97  soft-tissue]
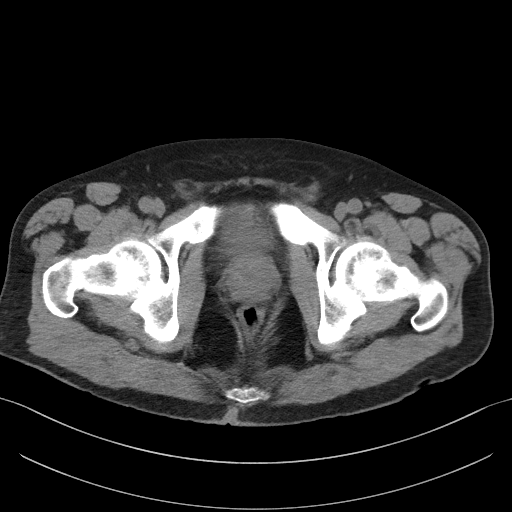
[im 21/97  soft-tissue]
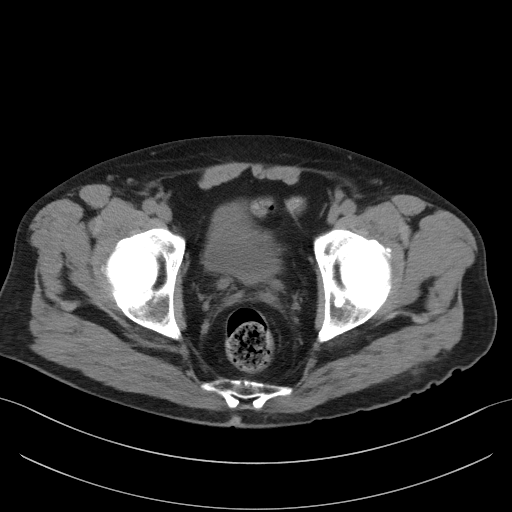
[im 31/97  soft-tissue]
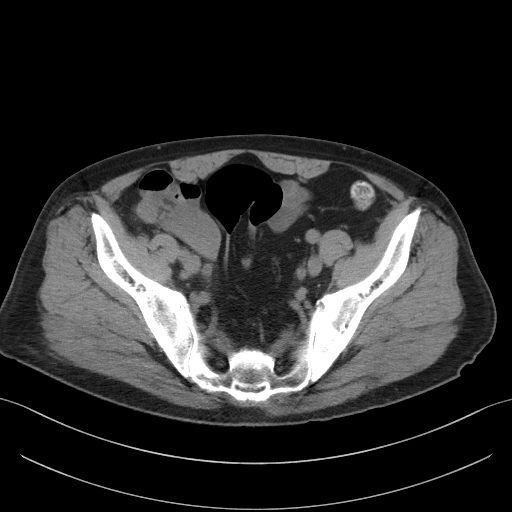
[im 36/97  soft-tissue]
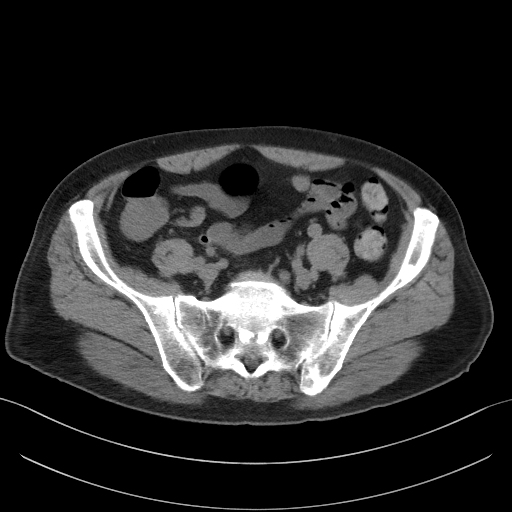
[im 46/97  soft-tissue]
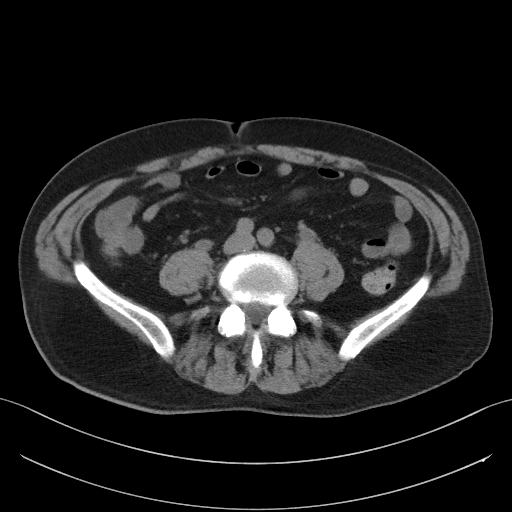
[im 51/97  soft-tissue]
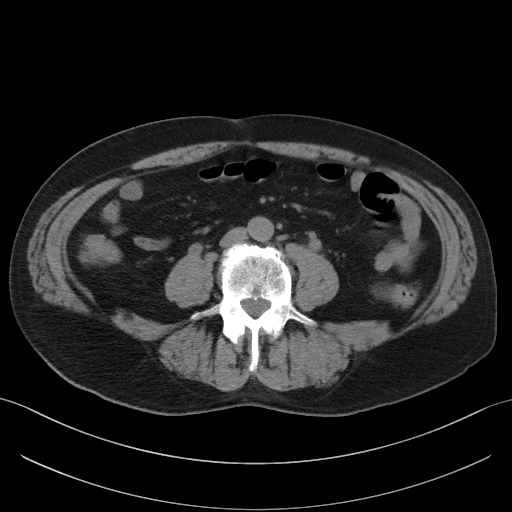
[im 61/97  soft-tissue]
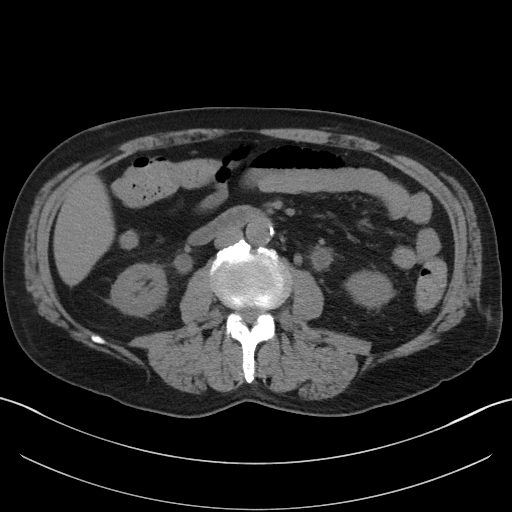
[im 66/97  soft-tissue]
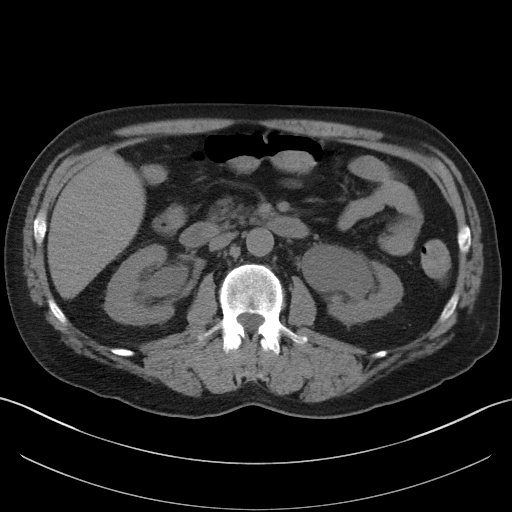
[im 66/97  bone]
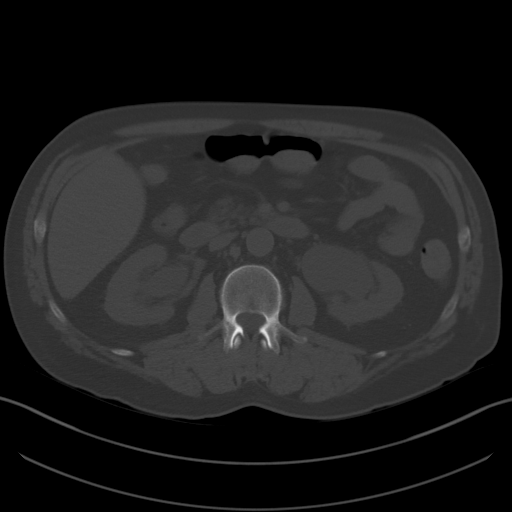
[im 76/97  soft-tissue]
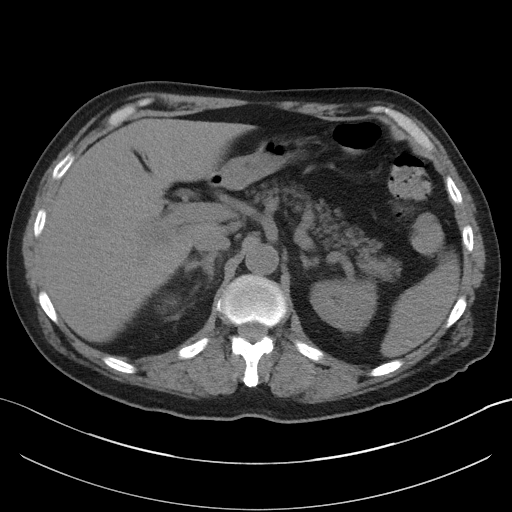
[im 81/97  soft-tissue]
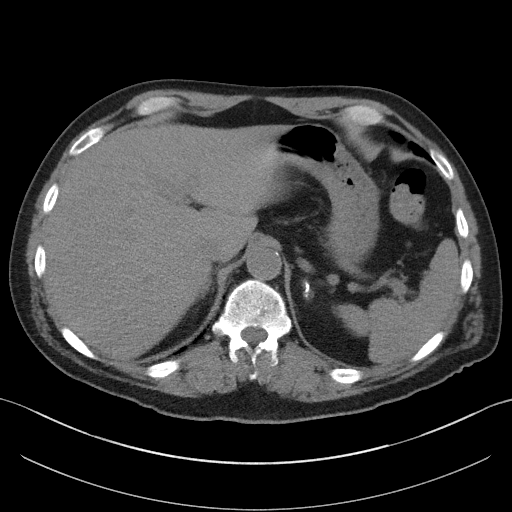
[im 91/97  soft-tissue]
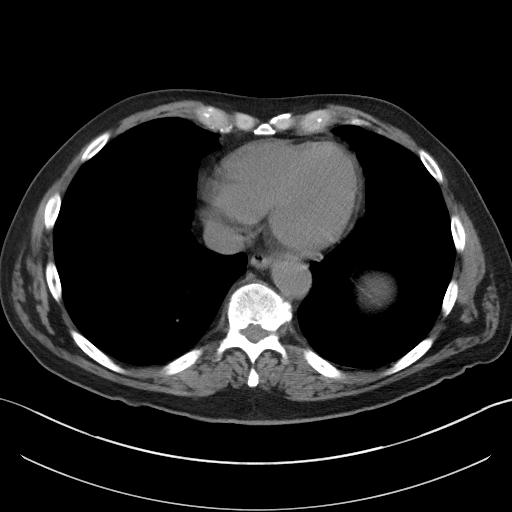

[Series 5: coronal st · coronal · 0.74mm/px · 3 of 78 slices shown]
[im 26/78  soft-tissue]
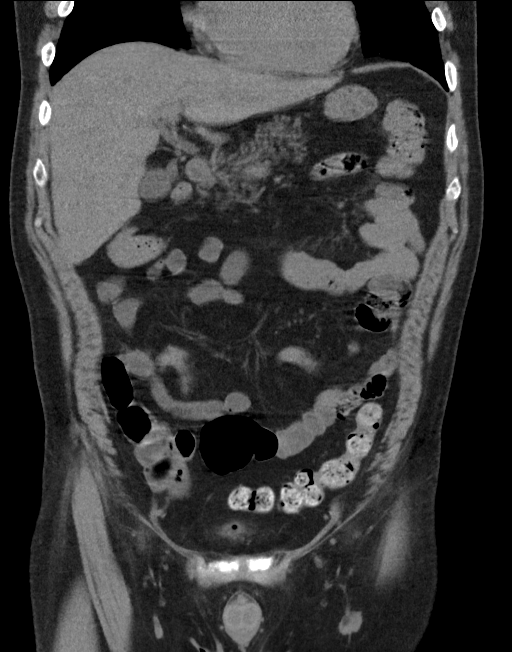
[im 35/78  soft-tissue]
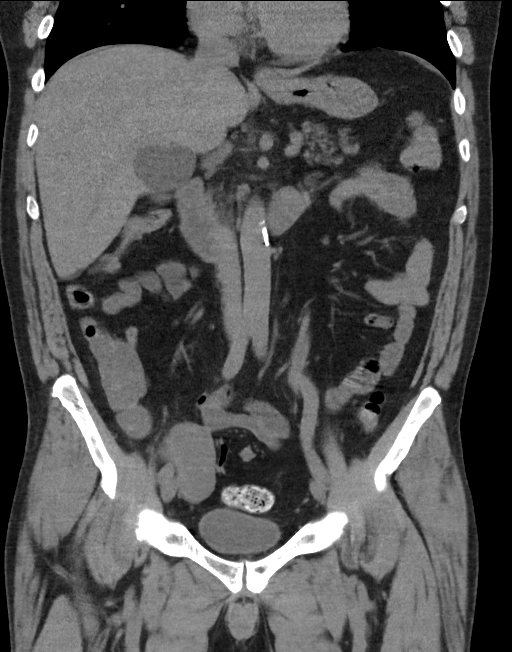
[im 43/78  soft-tissue]
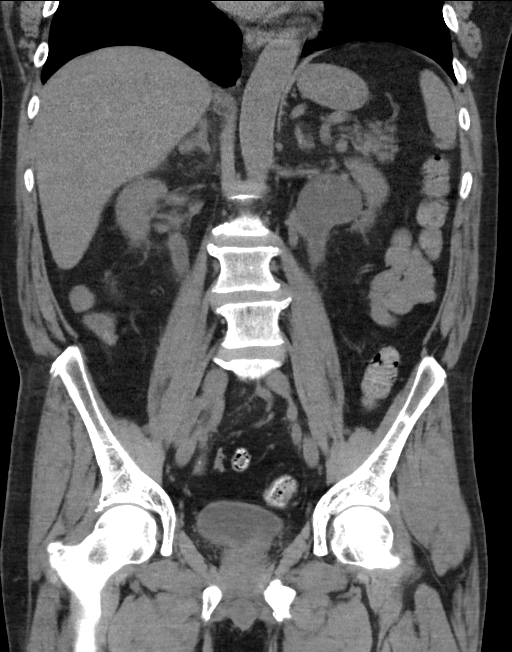

[15 of 46 positions shown; findings below may reference images not displayed]

FINDINGS: Lower chest: Stable 6 mm right lower lobe nodule on image [DATE].

Hepatobiliary: 4.7 by 3.7 cm cyst in the right hepatic lobe on image
[DATE], stable. The gallbladder appears unremarkable.

Pancreas: Unremarkable

Spleen: Unremarkable

Adrenals/Urinary Tract: Stable calcifications along the medial limb
of the left adrenal gland, no mass is identified.

The previous double-J ureteral stents shown on [DATE] have been
removed.

There is moderate right and prominent left hydronephrosis with
bilateral hydroureter extending down to the urinary bladder mild
stranding a along both collecting systems and proximal ureters. No
stones are identified.

Small amount of gas in the urinary bladder. I do not see obvious
bladder wall thickening although today's exam was performed without
IV contrast.

Right kidney upper pole fluid density exophytic lesion measuring
by 2.0 cm compatible with stable cyst.

Stomach/Bowel: Unremarkable

Vascular/Lymphatic: Aortoiliac atherosclerotic vascular disease.

Right external iliac lymph node 0.5 cm in short axis on image 68/2,
previously 1.1 cm on [DATE].

Anterior left perirectal space lymph node 0.6 cm in short axis on
image 81/2, previously 1.6 cm in short axis.

Left perirectal space lymph node previously measuring 0.7 cm in
short axis is no longer visible. A presacral lymph node eccentric to
the left previously measuring 0.7 cm in short axis is no longer
visible.

0.4 cm right internal iliac lymph node on image 74/2, previously
cm.

Reproductive: The prostate gland measures 3.9 by 3.4 by 3.8 cm
(volume = 26 cm^3), previously 4.9 by 4.6 by 5.9 cm (volume = 70
cm^3).

Other: No supplemental non-categorized findings.

Musculoskeletal: Previous lytic metastatic lesion of the left
inferior pubic ramus is now moderately sclerotic favoring interval
healing response. Previously lytic lesion of the left posterior
iliac bone is now sclerotic on image 65/2, measuring 2.3 by 1.5 cm.
A sclerotic lesion in the left iliac bone on image 62/2 measures
by 0.7 cm and likewise also probably represents a treated metastatic
lesion.

A 0.7 cm sclerotic focus in the L1 vertebral body eccentric to the
right is likely a previously treated sclerotic metastatic focus.
IMPRESSION: 1. Moderate right and prominent left hydronephrosis with bilateral
hydroureter extending down to the urinary bladder. No stones are
identified. A specific cause for the hydronephrosis and hydroureter
is not appreciated. The previous double-J ureteral stents have been
intervally removed.
2. Reduced size of the prostate gland, currently 26 cubic cm,
previously 70 cubic cm.
3. Reduced size of the pelvic lymph nodes common now currently
measuring within normal size limits.
4. Lesions in the left iliac bone, left inferior pubic ramus, and L1
vertebral body are now sclerotic favoring interval healing response.
5. Stable 6 mm right lower lobe pulmonary nodule.
6. Stable hepatic and right renal cysts.
7. Small amount of gas in the urinary bladder, query recent
catheterization.

Aortic Atherosclerosis ([8H]-[8H]).

## 2020-02-13 MED ORDER — ONDANSETRON HCL 4 MG PO TABS: 4.0000 mg | ORAL_TABLET | Freq: Four times a day (QID) | ORAL | Status: DC | PRN

## 2020-02-13 MED ORDER — TAMSULOSIN HCL 0.4 MG PO CAPS
0.4000 mg | ORAL_CAPSULE | Freq: Every day | ORAL | Status: DC
  Administered 2020-02-14 – 2020-02-15 (×2): 0.4 mg via ORAL
  Filled 2020-02-13 (×2): qty 1

## 2020-02-13 MED ORDER — PREDNISONE 5 MG PO TABS
5.0000 mg | ORAL_TABLET | Freq: Every day | ORAL | Status: DC
  Administered 2020-02-15: 5 mg via ORAL
  Filled 2020-02-13: qty 1

## 2020-02-13 MED ORDER — SODIUM CHLORIDE 0.9 % IV SOLN
2.0000 g | Freq: Three times a day (TID) | INTRAVENOUS | Status: DC
  Administered 2020-02-13 – 2020-02-14 (×2): 2 g via INTRAVENOUS
  Filled 2020-02-13 (×4): qty 2

## 2020-02-13 MED ORDER — ABIRATERONE ACETATE 250 MG PO TABS: 1000.0000 mg | ORAL_TABLET | Freq: Every day | ORAL | Status: DC

## 2020-02-13 MED ORDER — ONDANSETRON HCL 4 MG/2ML IJ SOLN: 4.0000 mg | Freq: Four times a day (QID) | INTRAMUSCULAR | Status: DC | PRN

## 2020-02-13 MED ORDER — TRAMADOL HCL 50 MG PO TABS
50.0000 mg | ORAL_TABLET | Freq: Four times a day (QID) | ORAL | Status: DC | PRN
  Administered 2020-02-14: 100 mg via ORAL
  Filled 2020-02-13: qty 2

## 2020-02-13 MED ORDER — MIRABEGRON ER 25 MG PO TB24
50.0000 mg | ORAL_TABLET | Freq: Every day | ORAL | Status: DC
  Administered 2020-02-14 – 2020-02-15 (×2): 50 mg via ORAL
  Filled 2020-02-13 (×2): qty 2

## 2020-02-13 MED ORDER — POTASSIUM CHLORIDE IN NACL 20-0.45 MEQ/L-% IV SOLN
INTRAVENOUS | Status: DC
  Filled 2020-02-13 (×5): qty 1000

## 2020-02-13 MED ORDER — ACETAMINOPHEN 500 MG PO TABS
1000.0000 mg | ORAL_TABLET | Freq: Three times a day (TID) | ORAL | Status: DC | PRN
  Administered 2020-02-13 – 2020-02-14 (×2): 1000 mg via ORAL
  Filled 2020-02-13 (×3): qty 2

## 2020-02-13 MED ORDER — MAGIC MOUTHWASH
5.0000 mL | Freq: Three times a day (TID) | ORAL | Status: DC | PRN
  Filled 2020-02-13: qty 5

## 2020-02-13 MED ORDER — ENOXAPARIN SODIUM 40 MG/0.4ML ~~LOC~~ SOLN: 40.0000 mg | SUBCUTANEOUS | Status: DC

## 2020-02-13 MED ORDER — VENLAFAXINE HCL 25 MG PO TABS
25.0000 mg | ORAL_TABLET | Freq: Every day | ORAL | Status: DC
  Administered 2020-02-14 – 2020-02-15 (×2): 25 mg via ORAL
  Filled 2020-02-13 (×2): qty 1

## 2020-02-13 MED ORDER — ENOXAPARIN SODIUM 40 MG/0.4ML ~~LOC~~ SOLN
40.0000 mg | SUBCUTANEOUS | Status: DC
  Administered 2020-02-14: 40 mg via SUBCUTANEOUS
  Filled 2020-02-13: qty 0.4

## 2020-02-13 NOTE — Progress Notes (Signed)
Referring Physician(s): Herrick,B  Supervising Physician: Aletta Edouard  Patient Status:  [name] - In-pt  Chief Complaint:  Back pain, fever, chills, urinary tract infection  Subjective: Patient familiar to IR service from right upper extremity PICC placement on 01/07/2020.  He has a history of advanced prostate cancer that was initially diagnosed in July 2020.  At that time he was also noted to have local extension into the bladder obscuring/obstructing his ureters.  He underwent ureteral stent placements at that time.  He has since been treated with hormonal therapy as well as systemic chemotherapy.  He has also had a refractory pseudomonal UTI/prostatitis.  He was seen by urology yesterday and ureteral stents were removed without difficulty.  Patient began having a fever last night associated with nausea.  He was seen again by urology today with ultrasound revealing bilateral hydronephrosis.  He was subsequently admitted to the hospital and request now received for bilateral percutaneous nephrostomies.  CT abdomen pelvis pending at this time.  He currently denies headache, chest pain, dyspnea, cough, abdominal pain, vomiting or abnormal bleeding.  He does have some back pain along with persistent temp elevation and some chills.  COVID-19 pending.  Past Medical History:  Diagnosis Date  . Cancer involving bladder by direct extension from prostate ([place]) 06/28/2019   Cystoscopy Shane [name]) - 2 bladder tumors, 1 on anterior prostate planned TURBT/TURP 06/2019 Biopsy - Gleason 5+4=9 prostate cancer Firmagon started 07/2019 planned metastatic survey  . History of chicken pox   . Lipoma of neck   . Seasonal allergies   . Syncopal episodes 2007   x 2 s/p unrevealing hospitalization without recurrence   Past Surgical History:  Procedure Laterality Date  . CYSTOSCOPY W/ RETROGRADES Bilateral 07/06/2019   Procedure: CYSTOSCOPY WITH RETROGRADE PYELOGRAM, BILATERAL STENT PLACEMENT;  Surgeon: Shane Hughs, MD;  Location: [facility];  Service: Urology;  Laterality: Bilateral;  . EP IMPLANTABLE DEVICE N/A 02/25/2016   Procedure: Loop Recorder Removal;  Surgeon: Deboraha Sprang, MD;  Location: [facility];  Service: Cardiovascular;  Laterality: N/A;  . Internal Cardiac Monitor  2007   due to syncopal episodes - never contacted for f/u  . LIPOMA EXCISION N/A 02/09/2016   Procedure: EXCISION NECK LIPOMA;  Surgeon: Autumn Messing III, MD;  Location: [facility];  Service: General;  Laterality: N/A;  . TRANSURETHRAL RESECTION OF BLADDER TUMOR N/A 07/06/2019   Procedure: TRANSURETHRAL RESECTION OF BLADDER TUMOR (TURBT);  Surgeon: Shane Hughs, MD;  Location: [facility];  Service: Urology;  Laterality: N/A;      Allergies: Bactrim [sulfamethoxazole-trimethoprim], Hydrocodone, Percocet [oxycodone-acetaminophen], Vancomycin, Penicillins, and Zithromax [azithromycin]  Medications: Prior to Admission medications   Medication Sig Start Date End Date Taking? Authorizing Provider  abiraterone acetate (ZYTIGA) 250 MG tablet Take 4 tablets (1,000 mg total) by mouth daily. Take on an empty stomach 1 hour before or 2 hours after a meal 12/13/19   [name], MD  magic mouthwash SOLN Take 5 mLs by mouth 3 (three) times daily as needed for mouth pain. 01/23/20   Michel Bickers, MD  meloxicam (MOBIC) 15 MG tablet Take 15 mg by mouth daily. 11/15/19   [provider]  MYRBETRIQ 50 MG TB24 tablet Take 50 mg by mouth daily. 11/19/19   [provider]  nystatin (MYCOSTATIN) 100000 UNIT/ML suspension  12/20/19   [provider]  predniSONE (DELTASONE) 5 MG tablet TAKE ONE TABLET BY MOUTH DAILY WITH BREAKFAST 02/05/20   Zola Button  N, MD  tamsulosin (FLOMAX) 0.4 MG CAPS capsule Take 0.4 mg by mouth daily. 05/02/19   [provider]  venlafaxine (EFFEXOR) 25 MG tablet Take 25 mg by mouth daily. 10/17/19   [provider]     Vital Signs: BP 134/73 (BP  Location: Right Arm)   Pulse 82   Temp (!) 101.5 F (38.6 C) (Oral)   SpO2 98%   Physical Exam awake, alert.  Chest with slightly diminished breath sounds bases.  Heart with regular rate/ rhythm.  Abdomen soft, positive bowel sounds, currently nontender.  No lower extremity edema.  Imaging: [place] EKG SITE RITE  Result Date: 02/13/2020 If Site Rite image not attached, placement could not be confirmed due to current cardiac rhythm.   Labs:  CBC: Recent Labs    07/04/19 1453 07/04/19 1453 07/06/19 1341 09/19/19 0901 11/20/19 0951 01/08/20 0000  WBC 7.2   < > 9.2 5.1 6.4 6.3  HGB 12.1*   < > 11.7* 10.2* 10.7* 10.4*  HCT 36.5*  --  35.2* 31.5* 33.1*  --   PLT 257   < > 229 262 241 240   < > = values in this interval not displayed.    COAGS: No results for input(s): INR, APTT in the last 8760 hours.  BMP: Recent Labs    07/04/19 1453 07/04/19 1453 07/06/19 1341 09/19/19 0901 11/20/19 0951 01/08/20 0000  NA 139   < > 141 143 140 141  K 4.5   < > 4.0 4.3 3.8 3.7  CL 110  --  108 108 107  --   CO2 22  --  21* 26 25  --   GLUCOSE 96  --  137* 95 98  --   BUN 31*  --  28* 25* 20  --   CALCIUM 9.3   < > 9.1 9.8 9.3 7.9*  CREATININE 2.06*   < > 2.18* 1.55* 1.50* 1.2  GFRNONAA 32*  --  30* 45* 47*  --   GFRAA 37*  --  35* 52* 54*  --    < > = values in this interval not displayed.    LIVER FUNCTION TESTS: Recent Labs    09/19/19 0901 11/20/19 0951 01/08/20 0000  BILITOT 0.5 0.5  --   AST 17 22 16   ALT 16 16 11   ALKPHOS 66 60 47  PROT 6.7 7.0  --   ALBUMIN 3.5 3.6 3.1*    Assessment and Plan: Pt with history of advanced prostate cancer that was initially diagnosed in July 2020.  At that time he was also noted to have local extension into the bladder obscuring/obstructing his ureters.  He underwent ureteral stent placements at that time.  He has since been treated with hormonal therapy as well as systemic chemotherapy.  He has also had a refractory pseudomonal  UTI/prostatitis.  He was seen by urology yesterday and ureteral stents were removed without difficulty.  Patient began having a fever last night associated with nausea.  He was seen again by urology today with ultrasound revealing bilateral hydronephrosis.  He was subsequently admitted to the hospital and request now received for bilateral percutaneous nephrostomies.  CT abdomen pelvis pending at this time.  Case has been reviewed by Dr. Pascal Lux.  Details/risks of nephrostomies, including but not limited to, internal bleeding, infection, injury to adjacent structures discussed with patient and spouse with their understanding and consent.  Procedure tentatively scheduled for tomorrow morning pending outcome of CT abdomen pelvis. Labs today  pending.    Electronically Signed: D. Rowe Robert, PA-C 02/13/2020, 4:02 PM   I spent a total of 30 minutes at the the patient's bedside AND on the patient's hospital floor or unit, greater than 50% of which was counseling/coordinating care for bilateral percutaneous nephrostomies    Patient ID: Shane [name], male   DOB: 12-10-1950, 69 y.o.   MRN: [id number]

## 2020-02-13 NOTE — Consult Note (Signed)
[name] for Infectious Disease       Reason for Consult: urinary tract infection/pyelonephritis    Referring Physician: Dr. [name]  Active Problems:   Cancer involving bladder by direct extension from prostate [name])   Malignant neoplasm of prostate ([name])   Recurrent UTI   Hydronephrosis   . enoxaparin (LOVENOX) injection  40 mg Subcutaneous Q24H  . [START ON 02/14/2020] mirabegron ER  50 mg Oral Daily  . [START ON 02/14/2020] predniSONE  5 mg Oral Q breakfast  . [START ON 02/14/2020] tamsulosin  0.4 mg Oral Daily  . [START ON 02/14/2020] venlafaxine  25 mg Oral Daily    Recommendations: Cefepime Blood cultures now I will hold the picc line due to rigors, fever, concern for bacteremia picc if blood cultures negative at 48 hours.   Assessment: He has Pseudomonal urine infection recently on meropenem and now with nephrostomy tubes removed followed by sudden onset of fever.  He currently is having significant rigors with a fever concerning for bacteremia and will check blood cultures and will starting cefepime. His BP and pulse are good  Antibiotics: Starting cefepime Received rocephin x 1 dose yesterday  HPI: Shane [name] is a 69 y.o. male with history of advanced prostate cancer diagnosed in July 2020 with extension into the bladder with stent placement.  He has been on chemotherapy and hormone therapy.  He had been treated for prostatitis empirically then saw my partner Dr. Megan Salon and treated with meropenem for Pseudomonal infection in the urine.  He did well with some improvement with his dysuria and hematuria.  Urine culture was repeated then and resistant to imipenem, FQ and sensitive to cefepime, ceftaz and pip/tazo.  He has a recent allergy with hives to Augmentin.  He is here with his wife.  He is having rigors, fever.    Review of Systems:  Constitutional: positive for fevers, chills and anorexia Gastrointestinal: negative for diarrhea Integument/breast:  negative for rash All other systems reviewed and are negative    Past Medical History:  Diagnosis Date  . Cancer involving bladder by direct extension from prostate ([name]) 06/28/2019   Cystoscopy [name]) - 2 bladder tumors, 1 on anterior prostate planned TURBT/TURP 06/2019 Biopsy - Gleason 5+4=9 prostate cancer Firmagon started 07/2019 planned metastatic survey  . History of chicken pox   . Lipoma of neck   . Seasonal allergies   . Syncopal episodes 2007   x 2 s/p unrevealing hospitalization without recurrence    Social History   Tobacco Use  . Smoking status: Never Smoker  . Smokeless tobacco: Never Used  Substance Use Topics  . Alcohol use: Yes    Alcohol/week: 0.0 standard drinks    Comment: 1 beer most evenings  . Drug use: No    Family History  Problem Relation Age of Onset  . Hyperlipidemia Mother   . Cancer Mother        peritoneal cancer  . Hemochromatosis Father        s/p liver transplant - pt screened negative 07/2015  . Hemochromatosis Paternal Grandmother   . Hemochromatosis Paternal Uncle   . Stroke Neg Hx   . Diabetes Neg Hx   . CAD Neg Hx     Allergies  Allergen Reactions  . Bactrim [Sulfamethoxazole-Trimethoprim] Hives    Red rash, hives  . Hydrocodone   . Percocet [Oxycodone-Acetaminophen] Other (See Comments)    TURNS RED FROM CHEST UP, LOOKS LIKE SUNBURN AND STARTS TO [place] BADLY  . Vancomycin   .  Penicillins Swelling and Rash    Did it involve swelling of the face/tongue/throat, SOB, or low BP? No Did it involve sudden or severe rash/hives, skin peeling, or any reaction on the inside of your mouth or nose? No Did you need to seek medical attention at a hospital or doctor's office? No When did it last happen?50 years ago If all above answers are "NO", may proceed with cephalosporin use.   . Zithromax [Azithromycin] Rash    Physical Exam: Constitutional: alert  Vitals:   02/13/20 1524  BP: 134/73  Pulse: 82  Temp: (!) 101.5 F (38.6  C)  SpO2: 98%   EYES: anicteric Cardiovascular: Cor RRR Respiratory: clear; Musculoskeletal: no pedal edema noted Skin: negatives: no rash  Lab Results  Component Value Date   WBC 6.3 01/08/2020   HGB 10.4 (A) 01/08/2020   HCT 33.1 (L) 11/20/2019   MCV 93.2 11/20/2019   PLT 240 01/08/2020    Lab Results  Component Value Date   CREATININE 1.2 01/08/2020   BUN 20 11/20/2019   NA 141 01/08/2020   K 3.7 01/08/2020   CL 107 11/20/2019   CO2 25 11/20/2019    Lab Results  Component Value Date   ALT 11 01/08/2020   AST 16 01/08/2020   ALKPHOS 47 01/08/2020     Microbiology: No results found for this or any previous visit (from the past 240 hour(s)).  Thayer Headings, MD [facility] for Infectious Disease [email or web address] 02/13/2020, 3:49 PM

## 2020-02-13 NOTE — Progress Notes (Signed)
Brief Pharmacy note:  Abiraterone (Zytiga) Hold Criteria:  ALT or AST > 5x ULN  TBili > 3x ULN  Active infection  Note that patient should be continued on prednisone if was receiving prednisone as outpatient.  Pt with active infection (on cefepime).  Zytiga discontinued.  Dolly Rias RPh 02/13/2020, 3:33 PM

## 2020-02-13 NOTE — H&P (Signed)
Cc: Hydronephrosis bilaterally, nausea, fever  History of present illness: 69 year old with a history of advanced prostate cancer that was initially diagnosed in July 2020.  At the time he was noted to have local extension into the bladder obscuring or obstructing his ureters.  He had stents placed at that time.  He has been treated with hormonal therapy as well as systemic chemotherapy.  He is also had a refractory pseudomonal UTI/prostatitis that has been very challenging and difficult to treat.  The patient was seen in clinic yesterday and we removed his stents.  At the time of stent removal we gave him an IM injection of 1 g Rocephin.  Stents were removed without difficulty.  Overnight the patient was noted to have a fever, had severe nausea, and this morning presented to our clinic for further evaluation.  Ultrasound was performed in clinic which demonstrated bilateral hydro-.  The patient's heart rate was noted to be 103, but he was afebrile.  Given the above circumstance he was admitted directly to the hospital.  Review of systems: A 12 point comprehensive review of systems was obtained and is negative unless otherwise stated in the history of present illness.  Patient Active Problem List   Diagnosis Date Noted  . Hydronephrosis 02/13/2020  . Loss of taste 01/31/2020  . Bone metastases ([place]) 12/31/2019  . Recurrent UTI 12/31/2019  . Malignant neoplasm of prostate ([place]) 08/17/2019  . Cancer involving bladder by direct extension from prostate ([place]) 06/28/2019  . HLD (hyperlipidemia) 08/02/2018  . Health maintenance examination 08/05/2017  . Anosmia 08/05/2017  . Family history of hemochromatosis 08/03/2016  . Medicare annual wellness visit, subsequent 07/29/2015  . Advanced care planning/counseling discussion 07/29/2015  . Bradycardia 07/29/2015  . History of cardiac monitoring 07/29/2015  . Syncopal episodes     No current facility-administered medications on file prior to  encounter.   Current Outpatient Medications on File Prior to Encounter  Medication Sig Dispense Refill  . abiraterone acetate (ZYTIGA) 250 MG tablet Take 4 tablets (1,000 mg total) by mouth daily. Take on an empty stomach 1 hour before or 2 hours after a meal 120 tablet 10  . magic mouthwash SOLN Take 5 mLs by mouth 3 (three) times daily as needed for mouth pain. 150 mL 0  . meloxicam (MOBIC) 15 MG tablet Take 15 mg by mouth daily.    . meropenem 1 g in sodium chloride 0.9 % 100 mL Inject 1 g into the vein every 8 (eight) hours. (Patient not taking: Reported on 01/31/2020)    . MYRBETRIQ 50 MG TB24 tablet Take 50 mg by mouth daily.    Marland Kitchen nystatin (MYCOSTATIN) 100000 UNIT/ML suspension     . predniSONE (DELTASONE) 5 MG tablet TAKE ONE TABLET BY MOUTH DAILY WITH BREAKFAST 90 tablet 0  . tamsulosin (FLOMAX) 0.4 MG CAPS capsule Take 0.4 mg by mouth daily.    Marland Kitchen venlafaxine (EFFEXOR) 25 MG tablet Take 25 mg by mouth daily.      Past Medical History:  Diagnosis Date  . Cancer involving bladder by direct extension from prostate ([place]) 06/28/2019   Cystoscopy [name]) - 2 bladder tumors, 1 on anterior prostate planned TURBT/TURP 06/2019 Biopsy - Gleason 5+4=9 prostate cancer Firmagon started 07/2019 planned metastatic survey  . History of chicken pox   . Lipoma of neck   . Seasonal allergies   . Syncopal episodes 2007   x 2 s/p unrevealing hospitalization without recurrence    Past Surgical History:  Procedure Laterality Date  .  CYSTOSCOPY W/ RETROGRADES Bilateral 07/06/2019   Procedure: CYSTOSCOPY WITH RETROGRADE PYELOGRAM, BILATERAL STENT PLACEMENT;  Surgeon: [name], MD;  Location: [facility];  Service: Urology;  Laterality: Bilateral;  . EP IMPLANTABLE DEVICE N/A 02/25/2016   Procedure: Loop Recorder Removal;  Surgeon: Deboraha Sprang, MD;  Location: [place] [facility];  Service: Cardiovascular;  Laterality: N/A;  . Internal Cardiac Monitor  2007   due to syncopal episodes - never  contacted for f/u  . LIPOMA EXCISION N/A 02/09/2016   Procedure: EXCISION NECK LIPOMA;  Surgeon: Autumn Messing III, MD;  Location: [place];  Service: General;  Laterality: N/A;  . TRANSURETHRAL RESECTION OF BLADDER TUMOR N/A 07/06/2019   Procedure: TRANSURETHRAL RESECTION OF BLADDER TUMOR (TURBT);  Surgeon: [name], MD;  Location: [facility];  Service: Urology;  Laterality: N/A;    Social History   Tobacco Use  . Smoking status: Never Smoker  . Smokeless tobacco: Never Used  Substance Use Topics  . Alcohol use: Yes    Alcohol/week: 0.0 standard drinks    Comment: 1 beer most evenings  . Drug use: No    Family History  Problem Relation Age of Onset  . Hyperlipidemia Mother   . Cancer Mother        peritoneal cancer  . Hemochromatosis Father        s/p liver transplant - pt screened negative 07/2015  . Hemochromatosis Paternal Grandmother   . Hemochromatosis Paternal Uncle   . Stroke Neg Hx   . Diabetes Neg Hx   . CAD Neg Hx     PE: There were no vitals filed for this visit. Patient appears to be in no acute distress  patient is alert and oriented x3 Atraumatic normocephalic head No cervical or supraclavicular lymphadenopathy appreciated No increased work of breathing, no audible wheezes/rhonchi Regular sinus rhythm/rate Abdomen is soft, nontender, nondistended, no CVA or suprapubic tenderness Lower extremities are symmetric without appreciable edema Grossly neurologically intact No identifiable skin lesions  No results for input(s): WBC, HGB, HCT in the last 72 hours. No results for input(s): NA, K, CL, CO2, GLUCOSE, BUN, CREATININE, CALCIUM in the last 72 hours. No results for input(s): LABPT, INR in the last 72 hours. No results for input(s): LABURIN in the last 72 hours. Results for orders placed or performed in visit on 01/31/20  Urine Culture     Status: Abnormal   Collection Time: 01/31/20  9:23 AM   Specimen: Urine  Result Value Ref Range  Status   MICRO NUMBER: [id number]  Final   SPECIMEN QUALITY: Adequate  Final   Sample Source URINE, CLEAN CATCH  Final   STATUS: FINAL  Final   ISOLATE 1: Pseudomonas aeruginosa (A)  Final    Comment: 10,000-49,000 CFU/mL of Pseudomonas aeruginosa      Susceptibility   Pseudomonas aeruginosa - URINE CULTURE, REFLEX    CEFTAZIDIME 2 Sensitive     CEFEPIME 2 Sensitive     CIPROFLOXACIN >=4 Resistant     LEVOFLOXACIN >=8 Resistant     GENTAMICIN <=1 Sensitive     IMIPENEM >=16 Resistant     PIP/TAZO 8 Sensitive     TOBRAMYCIN* <=1 Sensitive      * Legend:S = Susceptible  I = IntermediateR = Resistant  NS = Not susceptible* = Not tested  NR = Not reported**NN = See antimicrobic comments    Imaging: none  Imp: The patient has advanced prostate cancer with subsequent bilateral hydroureteronephrosis.  He  appears to have an associated urinary tract infection and possibly pyelonephritis.  He has been growing multidrug-resistant Pseudomonas as of a recent urine culture in February 26.  Recommendations: Admit for observation Consult infectious disease for antibiotic recommendations moving forward given clinical presentation Obtain PICC line for likelihood of home IV antibiotics Obtain CT of the abdomen and pelvis per interventional radiology for planning purposes for bilateral nephrostomy tube placement. N.p.o. past midnight for neph tubes early tomorrow morning. We will place the patient on subcu Lovenox as well as SCDs. We will resume the patient's home medications   [name]

## 2020-02-14 ENCOUNTER — Observation Stay (HOSPITAL_COMMUNITY): Payer: PPO

## 2020-02-14 DIAGNOSIS — Z1624 Resistance to multiple antibiotics: Secondary | ICD-10-CM | POA: Diagnosis present

## 2020-02-14 DIAGNOSIS — N136 Pyonephrosis: Secondary | ICD-10-CM | POA: Diagnosis present

## 2020-02-14 DIAGNOSIS — N390 Urinary tract infection, site not specified: Secondary | ICD-10-CM | POA: Diagnosis not present

## 2020-02-14 DIAGNOSIS — Z79899 Other long term (current) drug therapy: Secondary | ICD-10-CM | POA: Diagnosis not present

## 2020-02-14 DIAGNOSIS — E785 Hyperlipidemia, unspecified: Secondary | ICD-10-CM | POA: Diagnosis present

## 2020-02-14 DIAGNOSIS — N419 Inflammatory disease of prostate, unspecified: Secondary | ICD-10-CM | POA: Diagnosis present

## 2020-02-14 DIAGNOSIS — N131 Hydronephrosis with ureteral stricture, not elsewhere classified: Secondary | ICD-10-CM | POA: Diagnosis present

## 2020-02-14 DIAGNOSIS — Z8349 Family history of other endocrine, nutritional and metabolic diseases: Secondary | ICD-10-CM | POA: Diagnosis not present

## 2020-02-14 DIAGNOSIS — C679 Malignant neoplasm of bladder, unspecified: Secondary | ICD-10-CM | POA: Diagnosis present

## 2020-02-14 DIAGNOSIS — Z791 Long term (current) use of non-steroidal anti-inflammatories (NSAID): Secondary | ICD-10-CM | POA: Diagnosis not present

## 2020-02-14 DIAGNOSIS — C7951 Secondary malignant neoplasm of bone: Secondary | ICD-10-CM | POA: Diagnosis present

## 2020-02-14 DIAGNOSIS — B965 Pseudomonas (aeruginosa) (mallei) (pseudomallei) as the cause of diseases classified elsewhere: Secondary | ICD-10-CM | POA: Diagnosis present

## 2020-02-14 DIAGNOSIS — N139 Obstructive and reflux uropathy, unspecified: Secondary | ICD-10-CM

## 2020-02-14 DIAGNOSIS — R430 Anosmia: Secondary | ICD-10-CM | POA: Diagnosis present

## 2020-02-14 DIAGNOSIS — C61 Malignant neoplasm of prostate: Secondary | ICD-10-CM | POA: Diagnosis present

## 2020-02-14 DIAGNOSIS — Z808 Family history of malignant neoplasm of other organs or systems: Secondary | ICD-10-CM | POA: Diagnosis not present

## 2020-02-14 DIAGNOSIS — Z20822 Contact with and (suspected) exposure to covid-19: Secondary | ICD-10-CM | POA: Diagnosis present

## 2020-02-14 DIAGNOSIS — Z936 Other artificial openings of urinary tract status: Secondary | ICD-10-CM | POA: Diagnosis not present

## 2020-02-14 DIAGNOSIS — R319 Hematuria, unspecified: Secondary | ICD-10-CM | POA: Diagnosis present

## 2020-02-14 HISTORY — PX: IR NEPHROSTOMY PLACEMENT LEFT: IMG6063

## 2020-02-14 HISTORY — PX: IR NEPHROSTOMY PLACEMENT RIGHT: IMG6064

## 2020-02-14 LAB — CBC
HCT: 31.4 % — ABNORMAL LOW (ref 39.0–52.0)
Hemoglobin: 9.8 g/dL — ABNORMAL LOW (ref 13.0–17.0)
MCH: 29.3 pg (ref 26.0–34.0)
MCHC: 31.2 g/dL (ref 30.0–36.0)
MCV: 94 fL (ref 80.0–100.0)
Platelets: 170 10*3/uL (ref 150–400)
RBC: 3.34 MIL/uL — ABNORMAL LOW (ref 4.22–5.81)
RDW: 13.2 % (ref 11.5–15.5)
WBC: 6.7 10*3/uL (ref 4.0–10.5)
nRBC: 0 % (ref 0.0–0.2)

## 2020-02-14 LAB — COMPREHENSIVE METABOLIC PANEL
ALT: 17 U/L (ref 0–44)
AST: 27 U/L (ref 15–41)
Albumin: 3 g/dL — ABNORMAL LOW (ref 3.5–5.0)
Alkaline Phosphatase: 47 U/L (ref 38–126)
Anion gap: 8 (ref 5–15)
BUN: 18 mg/dL (ref 8–23)
CO2: 23 mmol/L (ref 22–32)
Calcium: 8.8 mg/dL — ABNORMAL LOW (ref 8.9–10.3)
Chloride: 106 mmol/L (ref 98–111)
Creatinine, Ser: 1.41 mg/dL — ABNORMAL HIGH (ref 0.61–1.24)
GFR calc Af Amer: 58 mL/min — ABNORMAL LOW (ref >60)
GFR calc non Af Amer: 50 mL/min — ABNORMAL LOW (ref >60)
Glucose, Bld: 103 mg/dL — ABNORMAL HIGH (ref 70–99)
Potassium: 3.8 mmol/L (ref 3.5–5.1)
Sodium: 137 mmol/L (ref 135–145)
Total Bilirubin: 0.8 mg/dL (ref 0.3–1.2)
Total Protein: 6 g/dL — ABNORMAL LOW (ref 6.5–8.1)

## 2020-02-14 LAB — SARS CORONAVIRUS 2 (TAT 6-24 HRS): SARS Coronavirus 2: NEGATIVE

## 2020-02-14 IMAGING — XA IR NEPHROSTOMY PLACEMENT RIGHT
2 series · 5 of 5 positions shown · non-contrast
Comparison: None.

CLINICAL DATA: Prostate carcinoma involving the bladder and causing
recurrent ureteral obstruction and bilateral hydronephrosis.

EXAM:
BILATERAL PERCUTANEOUS NEPHROSTOMY TUBE PLACEMENT WITH ULTRASOUND
AND FLUOROSCOPIC GUIDANCE

[Series 1: (id) · 3 of 3 slices shown]
[im 1/3]
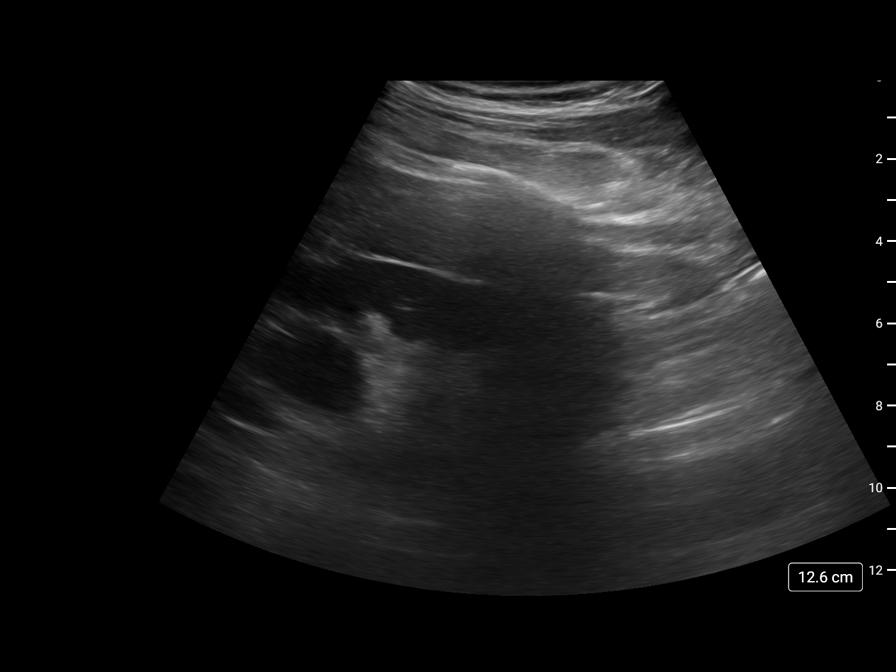
[im 2/3]
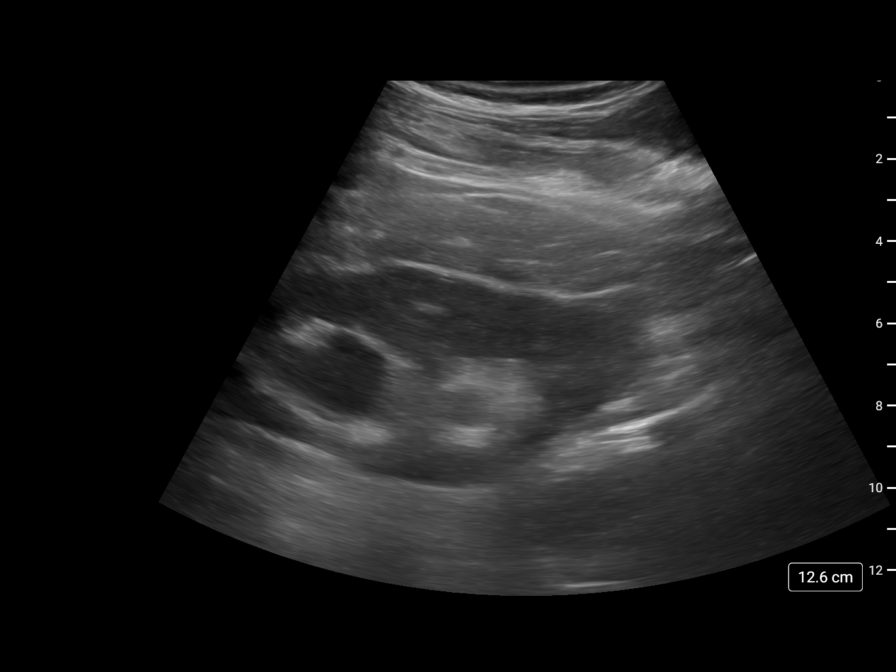
[im 3/3]
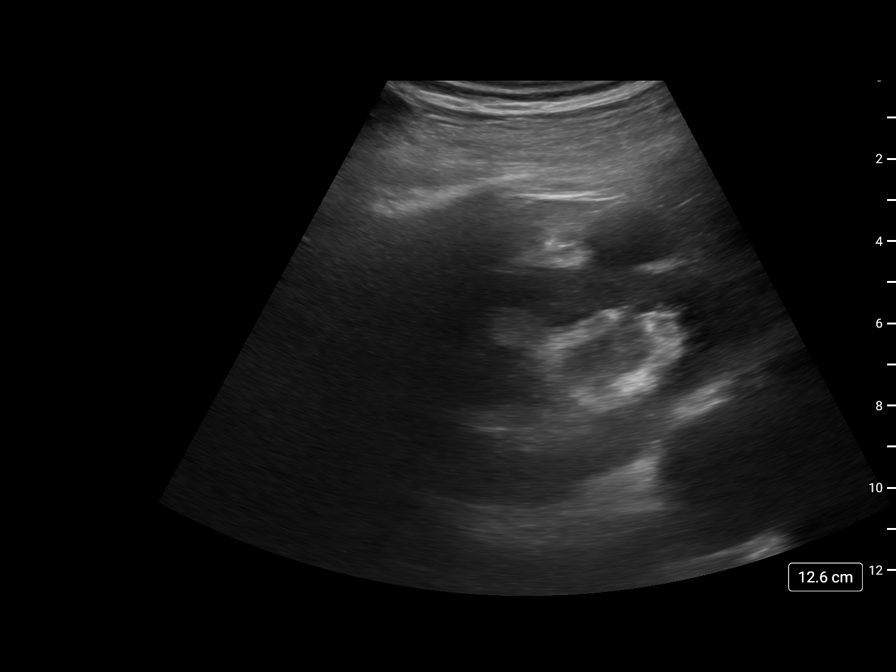

[Series 300: ir nephrostomy placement left · 2 of 2 slices shown]
[im 1/2]
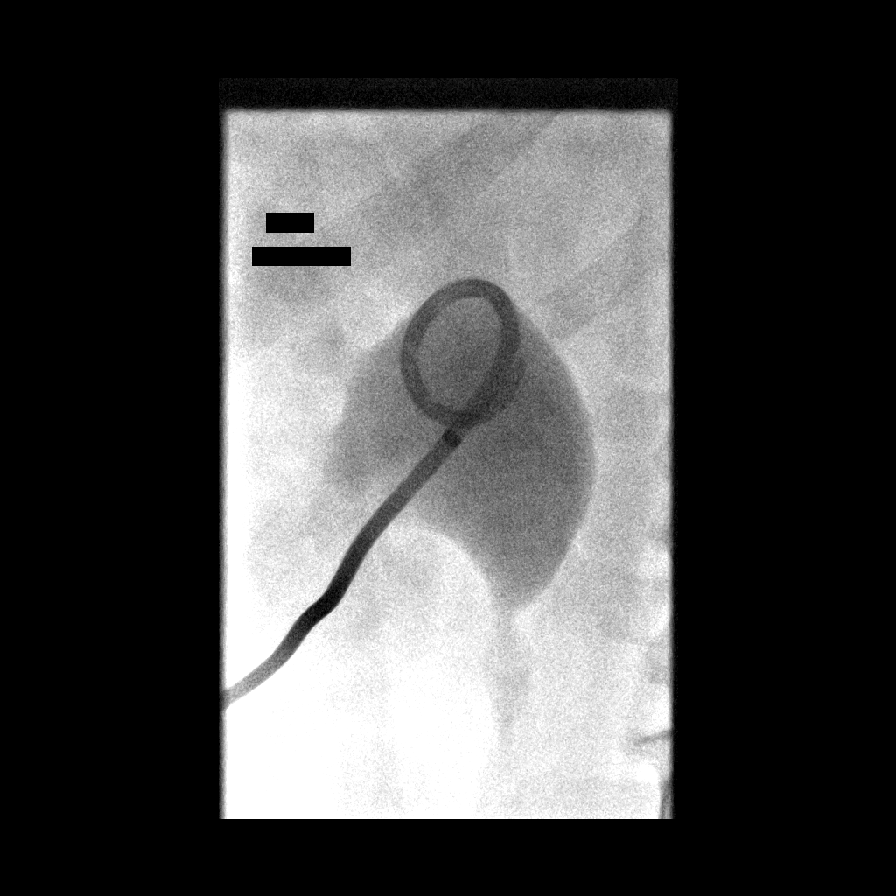
[im 2/2]
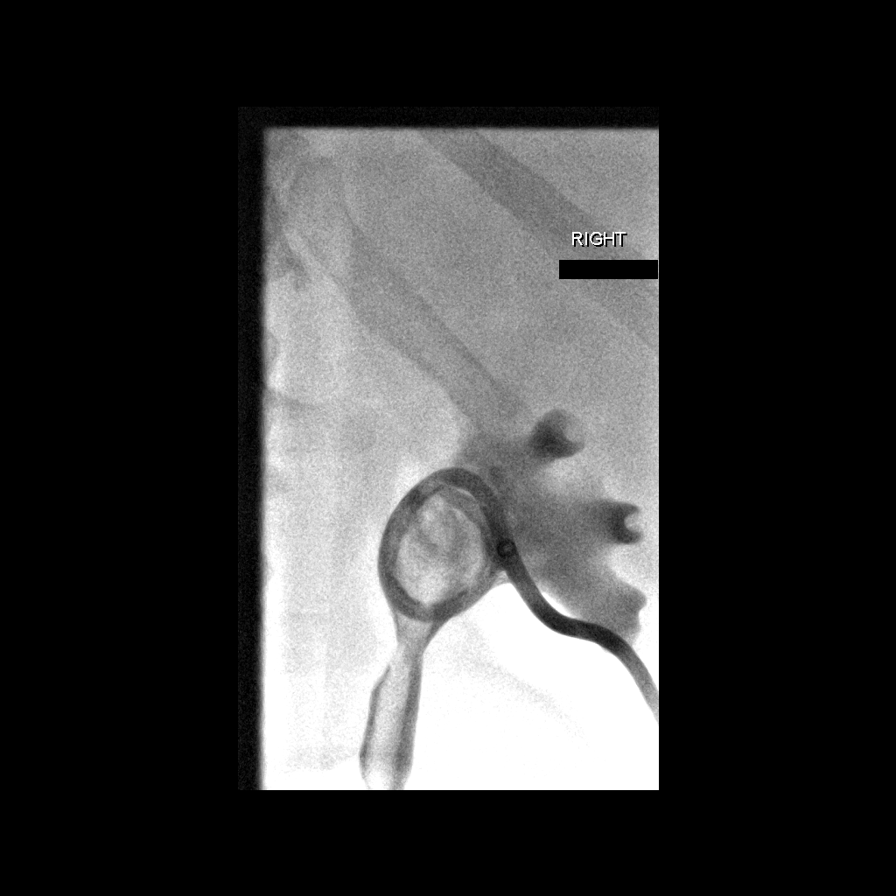

[5 of 5 positions shown; findings below may reference images not displayed]

ANESTHESIA/SEDATION:
2.5 mg IV Versed; 100 mcg IV Fentanyl.

Total Moderate Sedation Time

30 minutes.

The patient's level of consciousness and vital signs were monitored
continuously by radiology nursing throughout the procedure under my
direct supervision.

CONTRAST:  10 mL Omnipaque 300

MEDICATIONS:
The patient is receiving IV antibiotics and no additional
antibiotics were administered for the procedure.

FLUOROSCOPY TIME:  2 minutes and 18 seconds.  20.9 mGy.

PROCEDURE:
The procedure, risks, benefits, and alternatives were explained to
the patient. Questions regarding the procedure were encouraged and
answered. The patient understands and consents to the procedure. A
time-out was performed prior to initiating the procedure.

Bilateral flank regions were prepped with chlorhexidine in a sterile
fashion, and a sterile drape was applied covering the operative
field. A sterile gown and sterile gloves were used for the
procedure. Local anesthesia was provided with 1% Lidocaine.

Ultrasound was used to localize both kidneys. Under direct
ultrasound guidance, a 21 gauge needle was advanced into the renal
collecting system. Ultrasound image documentation was performed.
Aspiration of urine sample was performed followed by contrast
injection. A transitional dilator was advanced over a guidewire.
Percutaneous tract dilatation was then performed over the guidewire.

Bilateral 10 French percutaneous nephrostomy tubes were then
advanced and formed in the collecting system. Catheter position was
confirmed by fluoroscopy after contrast injection. Both catheters
were secured with Prolene retention sutures, StatLock devices and
attached to gravity bags.

COMPLICATIONS:
None.
FINDINGS: Ultrasound demonstrates significant bilateral hydronephrosis, left
greater than right. Bilateral nephrostomy tubes were placed and
formed at the level of the renal pelvis bilaterally. There was some
bleeding into the right renal collecting system after nephrostomy
tube placement.
IMPRESSION: Bilateral 10 French percutaneous nephrostomy tube placement. Both
tubes were attached to gravity bags.

## 2020-02-14 IMAGING — US IR NEPHROSTOMY PLACEMENT LEFT
2 series · 5 of 5 positions shown · non-contrast
Comparison: None.

CLINICAL DATA: Prostate carcinoma involving the bladder and causing
recurrent ureteral obstruction and bilateral hydronephrosis.

EXAM:
BILATERAL PERCUTANEOUS NEPHROSTOMY TUBE PLACEMENT WITH ULTRASOUND
AND FLUOROSCOPIC GUIDANCE

[Series 1: (id) · 3 of 3 slices shown]
[im 1/3]
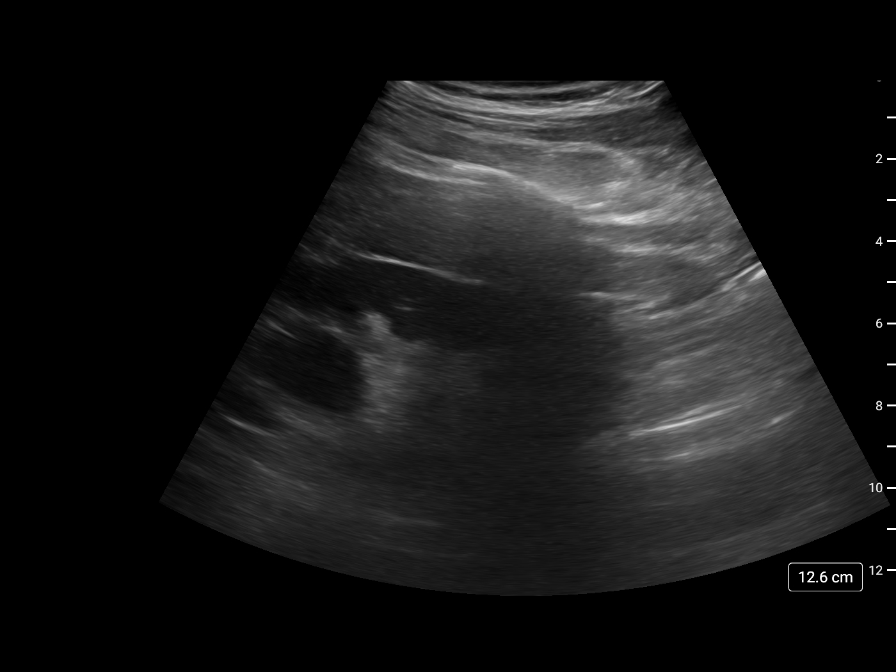
[im 2/3]
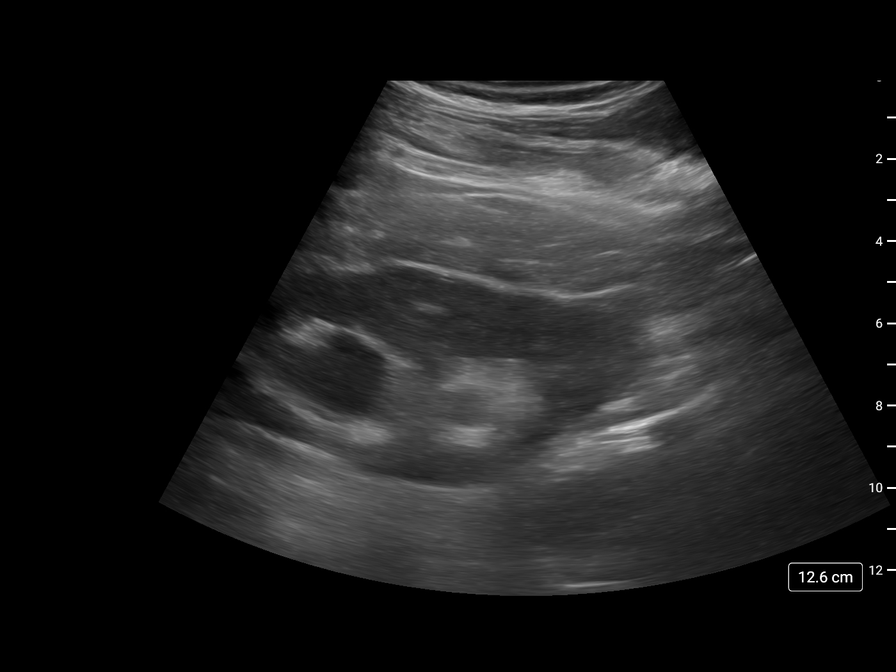
[im 3/3]
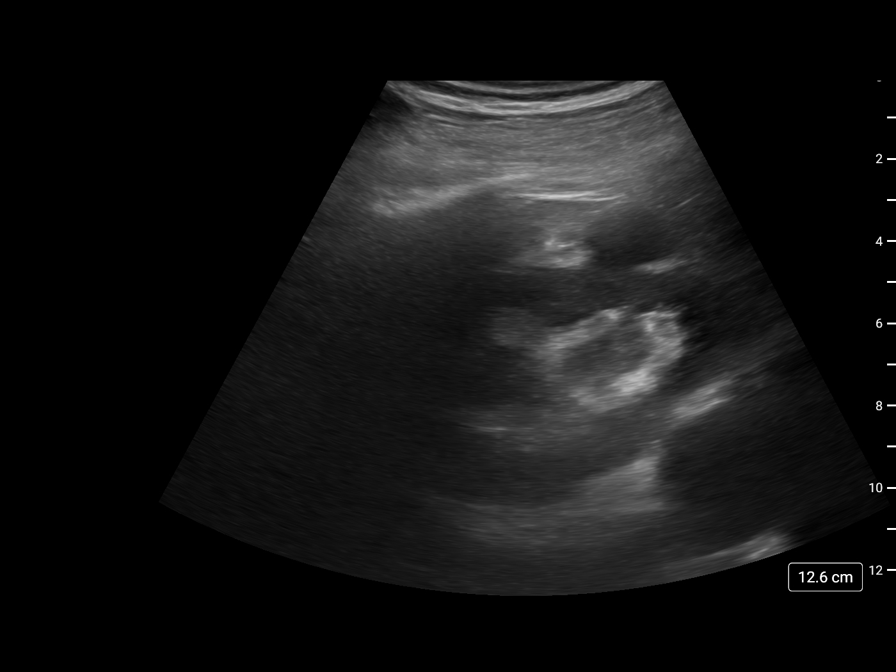

[Series 300: ir nephrostomy placement left · 2 of 2 slices shown]
[im 1/2]
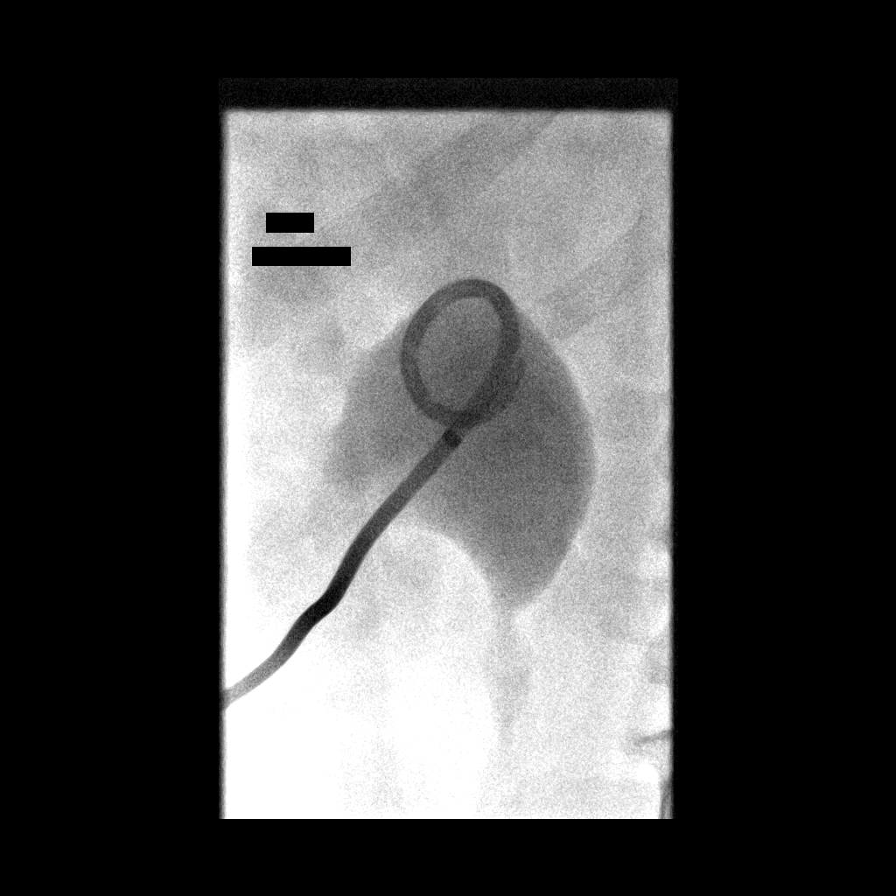
[im 2/2]
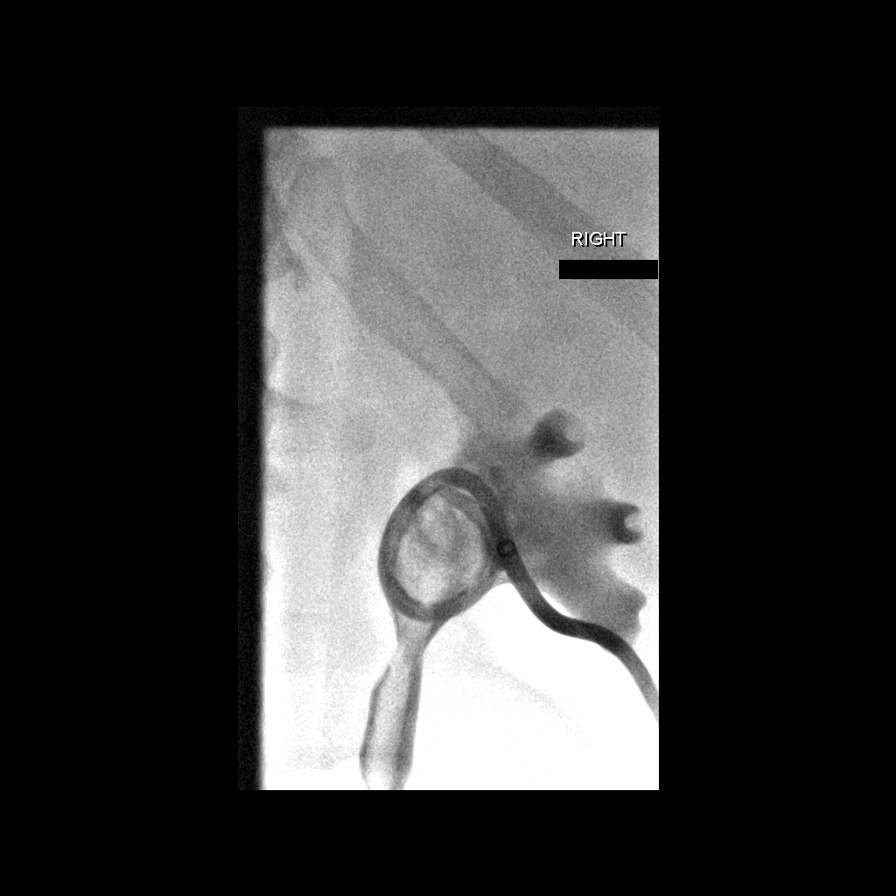

[5 of 5 positions shown; findings below may reference images not displayed]

ANESTHESIA/SEDATION:
2.5 mg IV Versed; 100 mcg IV Fentanyl.

Total Moderate Sedation Time

30 minutes.

The patient's level of consciousness and vital signs were monitored
continuously by radiology nursing throughout the procedure under my
direct supervision.

CONTRAST:  10 mL Omnipaque 300

MEDICATIONS:
The patient is receiving IV antibiotics and no additional
antibiotics were administered for the procedure.

FLUOROSCOPY TIME:  2 minutes and 18 seconds.  20.9 mGy.

PROCEDURE:
The procedure, risks, benefits, and alternatives were explained to
the patient. Questions regarding the procedure were encouraged and
answered. The patient understands and consents to the procedure. A
time-out was performed prior to initiating the procedure.

Bilateral flank regions were prepped with chlorhexidine in a sterile
fashion, and a sterile drape was applied covering the operative
field. A sterile gown and sterile gloves were used for the
procedure. Local anesthesia was provided with 1% Lidocaine.

Ultrasound was used to localize both kidneys. Under direct
ultrasound guidance, a 21 gauge needle was advanced into the renal
collecting system. Ultrasound image documentation was performed.
Aspiration of urine sample was performed followed by contrast
injection. A transitional dilator was advanced over a guidewire.
Percutaneous tract dilatation was then performed over the guidewire.

Bilateral 10 French percutaneous nephrostomy tubes were then
advanced and formed in the collecting system. Catheter position was
confirmed by fluoroscopy after contrast injection. Both catheters
were secured with Prolene retention sutures, StatLock devices and
attached to gravity bags.

COMPLICATIONS:
None.
FINDINGS: Ultrasound demonstrates significant bilateral hydronephrosis, left
greater than right. Bilateral nephrostomy tubes were placed and
formed at the level of the renal pelvis bilaterally. There was some
bleeding into the right renal collecting system after nephrostomy
tube placement.
IMPRESSION: Bilateral 10 French percutaneous nephrostomy tube placement. Both
tubes were attached to gravity bags.

## 2020-02-14 MED ORDER — ACETAMINOPHEN 650 MG RE SUPP
650.0000 mg | Freq: Once | RECTAL | Status: AC
  Administered 2020-02-14: 650 mg via RECTAL
  Filled 2020-02-14: qty 1

## 2020-02-14 MED ORDER — LIDOCAINE HCL 1 % IJ SOLN
INTRAMUSCULAR | Status: AC
  Filled 2020-02-14: qty 20

## 2020-02-14 MED ORDER — LOPERAMIDE HCL 2 MG PO CAPS: 2.0000 mg | ORAL_CAPSULE | ORAL | Status: DC | PRN

## 2020-02-14 MED ORDER — LIDOCAINE HCL (PF) 1 % IJ SOLN
INTRAMUSCULAR | Status: AC | PRN
  Administered 2020-02-14: 10 mL via INTRADERMAL

## 2020-02-14 MED ORDER — ABIRATERONE ACETATE 250 MG PO TABS
1000.0000 mg | ORAL_TABLET | Freq: Every day | ORAL | Status: DC
  Administered 2020-02-15: 1000 mg via ORAL

## 2020-02-14 MED ORDER — MIDAZOLAM HCL 2 MG/2ML IJ SOLN
INTRAMUSCULAR | Status: AC | PRN
  Administered 2020-02-14: 1 mg via INTRAVENOUS
  Administered 2020-02-14: 0.5 mg via INTRAVENOUS
  Administered 2020-02-14: 1 mg via INTRAVENOUS

## 2020-02-14 MED ORDER — IOHEXOL 300 MG/ML  SOLN
50.0000 mL | Freq: Once | INTRAMUSCULAR | Status: AC | PRN
  Administered 2020-02-14: 10 mL

## 2020-02-14 MED ORDER — SODIUM CHLORIDE 0.9 % IV SOLN
2.0000 g | Freq: Two times a day (BID) | INTRAVENOUS | Status: DC
  Administered 2020-02-14 – 2020-02-15 (×2): 2 g via INTRAVENOUS
  Filled 2020-02-14 (×3): qty 2

## 2020-02-14 MED ORDER — MIDAZOLAM HCL 2 MG/2ML IJ SOLN
INTRAMUSCULAR | Status: AC
  Filled 2020-02-14: qty 4

## 2020-02-14 MED ORDER — FENTANYL CITRATE (PF) 100 MCG/2ML IJ SOLN
INTRAMUSCULAR | Status: AC
  Filled 2020-02-14: qty 2

## 2020-02-14 MED ORDER — SODIUM CHLORIDE 0.9% FLUSH
5.0000 mL | Freq: Three times a day (TID) | INTRAVENOUS | Status: DC
  Administered 2020-02-15 (×2): 5 mL

## 2020-02-14 MED ORDER — FENTANYL CITRATE (PF) 100 MCG/2ML IJ SOLN
INTRAMUSCULAR | Status: AC | PRN
  Administered 2020-02-14 (×2): 50 ug via INTRAVENOUS

## 2020-02-14 NOTE — Progress Notes (Signed)
Urology Inpatient Progress Report  Hydronephrosis [N13.30]      Intv/Subj: No acute events overnight. Patient is without complaint. Feeling much better today, more lucid. Febrile overnight. Hungry.  Active Problems:   Cancer involving bladder by direct extension from prostate [facility])   Malignant neoplasm of prostate ([place])   Recurrent UTI   Hydronephrosis  Current Facility-Administered Medications  Medication Dose Route Frequency Provider Last Rate Last Admin  . 0.45 % NaCl with KCl 20 mEq / L infusion   Intravenous Continuous [name], MD 75 mL/hr at 02/14/20 0600 Rate Verify at 02/14/20 0600  . acetaminophen (TYLENOL) tablet 1,000 mg  1,000 mg Oral Q8H PRN [name], MD   1,000 mg at 02/13/20 1825  . ceFEPIme (MAXIPIME) 2 g in sodium chloride 0.9 % 100 mL IVPB  2 g Intravenous Q8H Thayer Headings, MD   Stopped at 02/14/20 0456  . enoxaparin (LOVENOX) injection 40 mg  40 mg Subcutaneous Q24H Allred, Darrell K, PA-C      . magic mouthwash  5 mL Oral TID PRN [name], MD      . mirabegron ER [facility]) tablet 50 mg  50 mg Oral Daily [name], MD      . ondansetron [facility]) tablet 4 mg  4 mg Oral Q6H PRN [name], MD       Or  . ondansetron [facility]) injection 4 mg  4 mg Intravenous Q6H PRN [name], MD      . predniSONE (DELTASONE) tablet 5 mg  5 mg Oral Q breakfast [name], MD      . tamsulosin [facility]) capsule 0.4 mg  0.4 mg Oral Daily [name], MD      . traMADol [facility]) tablet 50-100 mg  50-100 mg Oral Q6H PRN [name], MD      . venlafaxine [facility]) tablet 25 mg  25 mg Oral Daily [name], MD         Objective: Vital: Vitals:   02/13/20 1958 02/13/20 2332 02/14/20 0338 02/14/20 0608  BP: (!) 107/52 127/71 124/63 113/60  Pulse: 83 65 67 65  Resp:  20 18 18   Temp: (!) 100.5 F (38.1 C) 99.3 F (37.4 C) (!) 101.5 F (38.6 C) 99.5 F (37.5 C)  TempSrc: Oral  Oral Oral Oral  SpO2: 96% 100% 97% 96%  Weight:      Height:       I/Os: I/O last 3 completed shifts: In: 1051.6 [I.V.:901.6; IV Piggyback:150] Out: 350 [Urine:350]  Physical Exam:  General: Patient is in no apparent distress Lungs: Normal respiratory effort, chest expands symmetrically. GI: The abdomen is soft and nontender without mass. Ext: lower extremities symmetric  Lab Results: Recent Labs    02/14/20 0719  WBC 6.7  HGB 9.8*  HCT 31.4*   No results for input(s): NA, K, CL, CO2, GLUCOSE, BUN, CREATININE, CALCIUM in the last 72 hours. Recent Labs    02/13/20 1811  INR 1.2   No results for input(s): LABURIN in the last 72 hours. Results for orders placed or performed during the hospital encounter of 02/13/20  SARS CORONAVIRUS 2 (TAT 6-24 HRS) Nasopharyngeal Nasopharyngeal Swab     Status: None   Collection Time: 02/13/20  3:58 PM   Specimen: Nasopharyngeal Swab  Result Value Ref Range Status   SARS Coronavirus 2 NEGATIVE NEGATIVE Final    Comment: (NOTE) SARS-CoV-2 target nucleic acids are NOT DETECTED. The SARS-CoV-2 RNA is  generally detectable in upper and lower respiratory specimens during the acute phase of infection. Negative results do not preclude SARS-CoV-2 infection, do not rule out co-infections with other pathogens, and should not be used as the sole basis for treatment or other patient management decisions. Negative results must be combined with clinical observations, patient history, and epidemiological information. The expected result is Negative. Fact Sheet for Patients: SugarRoll.be Fact Sheet for Healthcare Providers: https://www.woods-mathews.com/ This test is not yet approved or cleared by the [place] FDA and  has been authorized for detection and/or diagnosis of SARS-CoV-2 by FDA under an Emergency Use Authorization (EUA). This EUA will remain  in effect (meaning this test can be used) for the  duration of the COVID-19 declaration under Section 56 4(b)(1) of the Act, 21 U.S.C. section 360bbb-3(b)(1), unless the authorization is terminated or revoked sooner. Performed at [facility], [place] [address], [place], [place] [address]     Studies/Results: CT ABDOMEN PELVIS WO CONTRAST  Result Date: 02/13/2020 CLINICAL DATA:  Hydronephrosis. Nausea and vomiting. History of bladder cancer. Hematuria. EXAM: CT ABDOMEN AND PELVIS WITHOUT CONTRAST TECHNIQUE: Multidetector CT imaging of the abdomen and pelvis was performed following the standard protocol without IV contrast. COMPARISON:  Renal ultrasound of 02/13/2020 and CT abdomen from 07/26/2019 FINDINGS: Lower chest: Stable 6 mm right lower lobe nodule on image 1/4. Hepatobiliary: 4.7 by 3.7 cm cyst in the right hepatic lobe on image 25/2, stable. The gallbladder appears unremarkable. Pancreas: Unremarkable Spleen: Unremarkable Adrenals/Urinary Tract: Stable calcifications along the medial limb of the left adrenal gland, no mass is identified. The previous double-J ureteral stents shown on 07/26/2019 have been removed. There is moderate right and prominent left hydronephrosis with bilateral hydroureter extending down to the urinary bladder mild stranding a along both collecting systems and proximal ureters. No stones are identified. Small amount of gas in the urinary bladder. I do not see obvious bladder wall thickening although today's exam was performed without IV contrast. Right kidney upper pole fluid density exophytic lesion measuring 2.4 by 2.0 cm compatible with stable cyst. Stomach/Bowel: Unremarkable Vascular/Lymphatic: Aortoiliac atherosclerotic vascular disease. Right external iliac lymph node 0.5 cm in short axis on image 68/2, previously 1.1 cm on 07/26/2019. Anterior left perirectal space lymph node 0.6 cm in short axis on image 81/2, previously 1.6 cm in short axis. Left perirectal space lymph node previously measuring 0.7 cm in short  axis is no longer visible. A presacral lymph node eccentric to the left previously measuring 0.7 cm in short axis is no longer visible. 0.4 cm right internal iliac lymph node on image 74/2, previously 1.3 cm. Reproductive: The prostate gland measures 3.9 by 3.4 by 3.8 cm (volume = 26 cm^3), previously 4.9 by 4.6 by 5.9 cm (volume = 70 cm^3). Other: No supplemental non-categorized findings. Musculoskeletal: Previous lytic metastatic lesion of the left inferior pubic ramus is now moderately sclerotic favoring interval healing response. Previously lytic lesion of the left posterior iliac bone is now sclerotic on image 65/2, measuring 2.3 by 1.5 cm. A sclerotic lesion in the left iliac bone on image 62/2 measures 1.2 by 0.7 cm and likewise also probably represents a treated metastatic lesion. A 0.7 cm sclerotic focus in the L1 vertebral body eccentric to the right is likely a previously treated sclerotic metastatic focus. IMPRESSION: 1. Moderate right and prominent left hydronephrosis with bilateral hydroureter extending down to the urinary bladder. No stones are identified. A specific cause for the hydronephrosis and hydroureter is not appreciated.  The previous double-J ureteral stents have been intervally removed. 2. Reduced size of the prostate gland, currently 26 cubic cm, previously 70 cubic cm. 3. Reduced size of the pelvic lymph nodes common now currently measuring within normal size limits. 4. Lesions in the left iliac bone, left inferior pubic ramus, and L1 vertebral body are now sclerotic favoring interval healing response. 5. Stable 6 mm right lower lobe pulmonary nodule. 6. Stable hepatic and right renal cysts. 7. Small amount of gas in the urinary bladder, query recent catheterization. Aortic Atherosclerosis (ICD10-I70.0). Electronically Signed   By: Van Clines M.D.   On: 02/13/2020 17:14   [place] EKG SITE RITE  Result Date: 02/13/2020 If Site Rite image not attached, placement could not be  confirmed due to current cardiac rhythm.   Assessment: OTherwise healthy 75M with advanced prostate cancer and refractory pseudomonas UTI with malignant ureteral extrinsic compression resulting in pyelonephritis and possibly bacteremia after his stents were removed in clinic.  He is started on Cefepime and is feeling better this AM.     Plan: Bilateral neph tubes today - appreciate IR's help Cont IV abx - f/u cultures, at some point he'll need a PICC. Will defer to ID for these decisions.  Appreciate their help/input. ADAT after procedure Will re-eval tomorrow for possible discharge   [name], MD Urology 02/14/2020, 7:39 AM

## 2020-02-14 NOTE — Progress Notes (Signed)
[facility] for Infectious Disease   Reason for visit: Follow up on urinary obstruction, UTI  Interval History: no growth on cultures. It appears urine culture was sent after starting antibiotics.  Patient feels much better, does not remember meeting me yesterday when more acutely ill.   Fever curve trending down today.  Had nephrostomy tubes placed this am.  WBC wnl.     Physical Exam: Constitutional:  Vitals:   02/14/20 0905 02/14/20 1205  BP: (!) 114/56 120/65  Pulse: (!) 56 68  Resp: 13 18  Temp:  98.1 F (36.7 C)  SpO2: 100% 100%   patient appears in NAD Eyes: anicteric Respiratory: Normal respiratory effort; CTA B Cardiovascular: RRR GI: soft, nt, nd  Review of Systems: Constitutional: negative for fevers and chills at this time Gastrointestinal: negative for diarrhea Integument/breast: negative for rash  Lab Results  Component Value Date   WBC 6.7 02/14/2020   HGB 9.8 (L) 02/14/2020   HCT 31.4 (L) 02/14/2020   MCV 94.0 02/14/2020   PLT 170 02/14/2020    Lab Results  Component Value Date   CREATININE 1.41 (H) 02/14/2020   BUN 18 02/14/2020   NA 137 02/14/2020   K 3.8 02/14/2020   CL 106 02/14/2020   CO2 23 02/14/2020    Lab Results  Component Value Date   ALT 17 02/14/2020   AST 27 02/14/2020   ALKPHOS 47 02/14/2020     Microbiology: Recent Results (from the past 240 hour(s))  SARS CORONAVIRUS 2 (TAT 6-24 HRS) Nasopharyngeal Nasopharyngeal Swab     Status: None   Collection Time: 02/13/20  3:58 PM   Specimen: Nasopharyngeal Swab  Result Value Ref Range Status   SARS Coronavirus 2 NEGATIVE NEGATIVE Final    Comment: (NOTE) SARS-CoV-2 target nucleic acids are NOT DETECTED. The SARS-CoV-2 RNA is generally detectable in upper and lower respiratory specimens during the acute phase of infection. Negative results do not preclude SARS-CoV-2 infection, do not rule out co-infections with other pathogens, and should not be used as the sole basis  for treatment or other patient management decisions. Negative results must be combined with clinical observations, patient history, and epidemiological information. The expected result is Negative. Fact Sheet for Patients: SugarRoll.be Fact Sheet for Healthcare Providers: https://www.woods-mathews.com/ This test is not yet approved or cleared by the [place] FDA and  has been authorized for detection and/or diagnosis of SARS-CoV-2 by FDA under an Emergency Use Authorization (EUA). This EUA will remain  in effect (meaning this test can be used) for the duration of the COVID-19 declaration under Section 56 4(b)(1) of the Act, 21 U.S.C. section 360bbb-3(b)(1), unless the authorization is terminated or revoked sooner. Performed at [facility], [place] [address], [place], [place] [address]   Culture, blood (routine x 2)     Status: None (Preliminary result)   Collection Time: 02/13/20  6:11 PM   Specimen: BLOOD  Result Value Ref Range Status   Specimen Description   Final    BLOOD LEFT ANTECUBITAL Performed at [facility] [address] [place], [place] [address]    Special Requests   Final    BOTTLES DRAWN AEROBIC AND ANAEROBIC Blood Culture adequate volume Performed at [facility] [address], [place], [place] [address]    Culture   Final    NO GROWTH < 12 HOURS Performed at [facility] [address], [place], [place] [address]    Report Status  PENDING  Incomplete  Culture, blood (routine x 2)     Status: None (Preliminary result)   Collection Time: 02/13/20  6:11 PM   Specimen: Arm; Blood  Result Value Ref Range Status   Specimen Description   Final    ARM LEFT LOWER Performed at [facility] [address] [place], [place] [address]    Special Requests   Final    BOTTLES DRAWN AEROBIC AND ANAEROBIC Blood Culture adequate volume Performed at [facility] [place] [address] [place], [place] [address]    Culture   Final    NO GROWTH < 12 HOURS Performed at [facility] [address] [place], [place] [address]    Report Status PENDING  Incomplete    Impression/Plan:  1. Urinary infection with obstruction - based on previous culture, I suspect Pseudomonas and on cefepime.  With complications of his history, I will treat him for 2 weeks.  Tolerating cefepime at this time.   Continue cefepime through march 24th  2.  Access - if blood cultures remain negative tomorrow by about mid day, I think it is ok to place a picc line and will order then.     3.  dispo - home health aware.

## 2020-02-14 NOTE — Procedures (Signed)
Interventional Radiology Procedure Note  Procedure: Bilateral nephrostomy tube placement  Complications: None  Estimated Blood Loss: < 10 mL  Findings: Bilateral 10 Fr percutaneous nephrostomy tubes placed and formed in renal pelvis bilaterally. Both attached to gravity bags. Some bleeding from right side post procedure; will flush both tubes.  Venetia Night. Kathlene Cote, M.D Pager:  [phone]

## 2020-02-14 NOTE — Progress Notes (Signed)
PHARMACY NOTE:  ANTIMICROBIAL RENAL DOSAGE ADJUSTMENT  Current antimicrobial regimen includes a mismatch between antimicrobial dosage and estimated renal function.  As per policy approved by the Pharmacy & Therapeutics and Medical Executive Committees, the antimicrobial dosage will be adjusted accordingly.  Current antimicrobial dosage:  Cefepime 2 g IV q8h  Indication: UTI/pyelonephritis  Renal Function:  Estimated Creatinine Clearance: 52.7 mL/min (A) (by C-G formula based on SCr of 1.41 mg/dL (H)).    Antimicrobial dosage has been changed to:  Cefepime 2 g IV q12h  Thank you for allowing pharmacy to be a part of this patient's care.  Lenis Noon, PharmD 02/14/2020 11:57 AM

## 2020-02-14 NOTE — Discharge Instructions (Signed)
Percutaneous Nephrostomy Home Guide Percutaneous nephrostomy is a procedure to insert a flexible tube into your kidney so that urine can leave your body. This procedure may be done if a medical condition prevents urine from leaving your kidney in the usual way. During the procedure, the nephrostomy tube is inserted in the right or left side of your lower back and is connected to an external drainage bag. After you have a nephrostomy tube placed, urine will collect in the drainage bag outside of your body. You will need to empty and change the drainage bag as needed. You will also need to take steps to care for the area where the nephrostomy tube was inserted (tube insertion site). How do I care for my nephrostomy tube?  Always keep your tubing, the leg bag, or the bedside drainage bag below the level of your kidney so that your urine drains freely.  Avoid activities that would cause bending or pulling of your tubing. Ask your health care provider what activities are safe for you.  When connecting your nephrostomy tube to a drainage bag, make sure that there are no kinks in the tubing and that your urine is draining freely. You may want to gently wrap an elastic bandage over the tubing. This will help keep the tubing in place and prevent it from kinking. Make sure there is no tension on the tubing so it does not become dislodged.  At night, you may want to connect your nephrostomy tube or the leg bag to a larger bedside drainage bag. How do I empty the drainage bag? Empty the leg bag or bedside drainage bag whenever it becomes ? full. Also empty it before you go to sleep. Most drainage bags have a drain at the bottom that allows urine to be emptied. Follow these basic steps: 1. Hold the drainage bag over a toilet or collection container. Use a measuring container if your health care provider told you to measure your urine. 2. Open the drain of the bag and allow the urine to drain out. 3. After all the  urine has drained from the drainage bag, close the drain fully. 4. Flush the urine down the toilet. If a collection container was used, rinse the container. How do I change the dressing around the nephrostomy tube? Change your dressing and clean your tube exit site as told by your health care provider. You may need to change the dressing every day for the first 2 weeks after having a nephrostomy tube inserted. After the first 2 weeks, you may be told to change the dressing two times a week. Supplies needed:  Mild soap and water.  Split gauze pads, 4  4 inches (10 x 10 cm).  Gauze pads, 4  4 inches (10 x 10 cm).  Paper tape. How to change the dressing: Because of the location of your nephrostomy tube, you may need help from another person to complete dressing changes. Follow these basic steps: 1. Wash hands with soap and water. 2. Gently remove the tape and dressing from around the nephrostomy tube. Be careful not to pull on the tube while removing the dressing. Avoid using scissors because they may damage the tube. 3. Wash the skin around the tube with mild soap and water, rinse well, and pat the skin dry with a clean cloth. 4. Check the skin around the drain for redness, swelling, pus, warmth, or a bad smell. 5. If the drain was sutured to the skin, check the suture to verify  that it is still anchored in the skin. °6. Place two split gauze pads in and around the tube exit site. Do not apply ointments or alcohol to the site. °7. Place a gauze pad on top of the split gauze pad. °8. Coil the tube on top of the gauze. The tubing should rest on the gauze, not on the skin. °9. Place tape around each edge of the gauze pad. °10. Secure the nephrostomy tubing. Make sure that the tube does not kink or become pinched. The tubing should rest on the gauze pad, not on the skin. °11. Dispose of used supplies properly. ° °How do I flush my nephrostomy tube? °Use a saline syringe to rinse out (flush) your  nephrostomy tube as told by your health care provider. Flushing is easier if a three-way stopcock is placed between the tube and the drainage bag. One connection of the stopcock connects to your tube, the second connects to the drainage bag, and the third is usually covered with a cap. The lever on the stopcock points to the direction on the stopcock that is closed to flow. Normally, the lever points in the direction of the cap to allow urine to drain from the tube to the drainage bag. °Supplies needed: °· Rubbing alcohol wipe. °· 10 mL 0.9% saline syringe. °How to flush the tube: °1. Move the lever of the three-way stopcock so it points toward the drainage bag. °2. Clean the cap with a rubbing alcohol wipe. °3. Screw the tip of a 10 mL 0.9% saline syringe onto the cap. °4. Using the syringe plunger, slowly push the 10 mL 0.9% saline in the syringe over 5-10 seconds. If resistance is met or pain occurs while pushing, stop pushing the saline. °5. Remove the syringe from the cap. °6. Return the stopcock lever to the usual position, pointing in the direction of the cap. °7. Dispose of used supplies properly. °How do I replace the drainage bag? °Replace the drainage bag, three-way stopcock, and any extension tubing as told by your health care provider. Make sure you always have an extra drainage bag and connecting tubing available. °1. Empty urine from your drainage bag. °2. Gather a new drainage bag, three-way stopcock, and any extension tubing. °3. Remove the drainage bag, three-way stopcock, and any extension tubing from the nephrostomy tube. °4. Attach the new leg bag or bedside drainage bag, three-way stopcock, and any extension tubing to the nephrostomy tube. °5. Dispose of the used drainage bag, three-way stopcock, and any extension. °Contact a health care provider if: °· You have problems with any of the valves or tubing. °· You have persistent pain or soreness in your back. °· You have more redness, swelling,  or pain around your tube insertion site. °· You have more fluid or blood coming from your tube insertion site. °· Your tube insertion site feels warm to the touch. °· You have pus or a bad smell coming from your tube insertion site. °· You have increased urine output or you feel burning when urinating. °Get help right away if: °· You have pain in your abdomen during the first week. °· You have chest pain or have trouble breathing. °· You have a new appearance of blood in your urine. °· You have a fever or chills. °· You have back pain that is not relieved by your medicine. °· You have decreased urine output. °· Your nephrostomy tube comes out. °This information is not intended to replace advice given to you by   your health care provider. Make sure you discuss any questions you have with your health care provider. Document Revised: 03/15/2019 Document Reviewed: 09/03/2016 Elsevier Patient Education  [place]. Percutaneous Nephrostomy, Care After This sheet gives you information about how to care for yourself after your procedure. Your health care provider may also give you more specific instructions. If you have problems or questions, contact your health care provider. What can I expect after the procedure? After the procedure, it is common to have:  Some soreness where the nephrostomy tube was inserted (tube insertion site).  Blood-tinged drainage from the nephrostomy tube for the first 24 hours. Follow these instructions at home: Activity  Return to your normal activities as told by your health care provider. Ask your health care provider what activities are safe for you.  Avoid activities that may cause the nephrostomy tubing to bend.  Do not take baths, swim, or use a hot tub until your health care provider approves. Ask your health care provider if you can take showers. Cover the nephrostomy tube dressing with a watertight covering when you take a shower.  Donot drive for 24 hours  if you were given a medicine to help you relax (sedative). Care of the tube insertion site   Follow instructions from your health care provider about how to take care of your tube insertion site. Make sure you: ? Wash your hands with soap and water before you change your bandage (dressing). If soap and water are not available, use hand sanitizer. ? Change your dressing as told by your health care provider. Be careful not to pull on the tube while removing the dressing. ? When you change the dressing, wash the skin around the tube, rinse well, and pat the skin dry.  Check the tube insertion area every day for signs of infection. Check for: ? More redness, swelling, or pain. ? More fluid or blood. ? Warmth. ? Pus or a bad smell. Care of the nephrostomy tube and drainage bag  Always keep the tubing, the leg bag, or the bedside drainage bags below the level of the kidney so that your urine drains freely.  When connecting your nephrostomy tube to a drainage bag, make sure that there are no kinks in the tubing and that your urine is draining freely. You may want to use an elastic bandage to wrap any exposed tubing that goes from the nephrostomy tube to any of the connecting tubes.  At night, you may want to connect your nephrostomy tube or the leg bag to a larger bedside drainage bag.  Follow instructions from your health care provider about how to empty or change the drainage bag.  Empty the drainage bag when it becomes ? full.  Replace the drainage bag and any extension tubing that is connected to your nephrostomy tube every 3 weeks or as often as told by your health care provider. Your health care provider will explain how to change the drainage bag and extension tubing. General instructions  Take over-the-counter and prescription medicines only as told by your health care provider.  Keep all follow-up visits as told by your health care provider. This is important. Contact a health care  provider if:  You have problems with any of the valves or tubing.  You have persistent pain or soreness in your back.  You have more redness, swelling, or pain around your tube insertion site.  You have more fluid or blood coming from your tube insertion site.  Your  tube insertion site feels warm to the touch.  You have pus or a bad smell coming from your tube insertion site.  You have increased urine output or you feel burning when urinating. Get help right away if:  You have pain in your abdomen during the first week.  You have chest pain or have trouble breathing.  You have a new appearance of blood in your urine.  You have a fever or chills.  You have back pain that is not relieved by your medicine.  You have decreased urine output.  Your nephrostomy tube comes out. This information is not intended to replace advice given to you by your health care provider. Make sure you discuss any questions you have with your health care provider. Document Revised: 11/04/2017 Document Reviewed: 09/03/2016 Elsevier Patient Education  [place]. Moderate Conscious Sedation, Adult, Care After These instructions provide you with information about caring for yourself after your procedure. Your health care provider may also give you more specific instructions. Your treatment has been planned according to current medical practices, but problems sometimes occur. Call your health care provider if you have any problems or questions after your procedure. What can I expect after the procedure? After your procedure, it is common:  To feel sleepy for several hours.  To feel clumsy and have poor balance for several hours.  To have poor judgment for several hours.  To vomit if you eat too soon. Follow these instructions at home: For at least 24 hours after the procedure:   Do not: ? Participate in activities where you could fall or become injured. ? Drive. ? Use heavy  machinery. ? Drink alcohol. ? Take sleeping pills or medicines that cause drowsiness. ? Make important decisions or sign legal documents. ? Take care of children on your own.  Rest. Eating and drinking  Follow the diet recommended by your health care provider.  If you vomit: ? Drink water, juice, or soup when you can drink without vomiting. ? Make sure you have little or no nausea before eating solid foods. General instructions  Have a responsible adult stay with you until you are awake and alert.  Take over-the-counter and prescription medicines only as told by your health care provider.  If you smoke, do not smoke without supervision.  Keep all follow-up visits as told by your health care provider. This is important. Contact a health care provider if:  You keep feeling nauseous or you keep vomiting.  You feel light-headed.  You develop a rash.  You have a fever. Get help right away if:  You have trouble breathing. This information is not intended to replace advice given to you by your health care provider. Make sure you discuss any questions you have with your health care provider. Document Revised: 11/04/2017 Document Reviewed: 03/13/2016 Elsevier Patient Education  2020 [employer].

## 2020-02-15 ENCOUNTER — Inpatient Hospital Stay: Payer: Self-pay

## 2020-02-15 DIAGNOSIS — N1330 Unspecified hydronephrosis: Secondary | ICD-10-CM

## 2020-02-15 LAB — BASIC METABOLIC PANEL
Anion gap: 6 (ref 5–15)
BUN: 16 mg/dL (ref 8–23)
CO2: 26 mmol/L (ref 22–32)
Calcium: 8.9 mg/dL (ref 8.9–10.3)
Chloride: 107 mmol/L (ref 98–111)
Creatinine, Ser: 1.32 mg/dL — ABNORMAL HIGH (ref 0.61–1.24)
GFR calc Af Amer: >60 mL/min (ref >60)
GFR calc non Af Amer: 55 mL/min — ABNORMAL LOW (ref >60)
Glucose, Bld: 102 mg/dL — ABNORMAL HIGH (ref 70–99)
Potassium: 4.4 mmol/L (ref 3.5–5.1)
Sodium: 139 mmol/L (ref 135–145)

## 2020-02-15 LAB — CBC
HCT: 31.9 % — ABNORMAL LOW (ref 39.0–52.0)
Hemoglobin: 9.9 g/dL — ABNORMAL LOW (ref 13.0–17.0)
MCH: 29.6 pg (ref 26.0–34.0)
MCHC: 31 g/dL (ref 30.0–36.0)
MCV: 95.2 fL (ref 80.0–100.0)
Platelets: 168 10*3/uL (ref 150–400)
RBC: 3.35 MIL/uL — ABNORMAL LOW (ref 4.22–5.81)
RDW: 12.9 % (ref 11.5–15.5)
WBC: 6.4 10*3/uL (ref 4.0–10.5)
nRBC: 0 % (ref 0.0–0.2)

## 2020-02-15 MED ORDER — CEFEPIME IV (FOR PTA / DISCHARGE USE ONLY): 2.0000 g | Freq: Two times a day (BID) | INTRAVENOUS | 0 refills | Status: AC

## 2020-02-15 MED ORDER — SODIUM CHLORIDE 0.9% FLUSH: 10.0000 mL | INTRAVENOUS | Status: DC | PRN

## 2020-02-15 MED ORDER — SODIUM CHLORIDE 0.9% FLUSH: 10.0000 mL | Freq: Two times a day (BID) | INTRAVENOUS | Status: DC

## 2020-02-15 MED ORDER — CHLORHEXIDINE GLUCONATE CLOTH 2 % EX PADS: 6.0000 | MEDICATED_PAD | Freq: Every day | CUTANEOUS | Status: DC

## 2020-02-15 MED ORDER — MELOXICAM 15 MG PO TABS: 15.0000 mg | ORAL_TABLET | Freq: Every day | ORAL | Status: DC | PRN

## 2020-02-15 NOTE — TOC Transition Note (Signed)
Transition of Care [facility]) - CM/SW Discharge Note   Patient Details  Name: Shane [name] MRN: [id number] Date of Birth: November 15, 1951  Transition of Care [facility] CM/SW Contact:  Lennart Pall, LCSW Phone Number: 02/15/2020, 3:14 PM   Clinical Narrative:   Pt ready for d/c following abx education.  HHRN via [place] [facility] and IV abx via [facility].    Final next level of care: [place] Barriers to Discharge: Barriers Resolved   Patient Goals and CMS Choice Patient states their goals for this hospitalization and ongoing recovery are:: to go home CMS Medicare.gov Compare Post Acute Care list provided to:: Patient Choice offered to / list presented to : Patient  Discharge Placement                       Discharge Plan and Services In-house Referral: Clinical Social Work   Post Acute Care Choice: Home Health          DME Arranged: IV pump/equipment DME Agency: Other - Comment([facility]) Date DME Agency Contacted: 02/14/20   Representative spoke with at DME Agency: Carolynn Sayers [facility] Arranged: RN [place] Agency: [place] Date [place]: 02/15/20 Time [place]: 47 Representative spoke with at [place]: [place] ([place]) Interventions     Readmission Risk Interventions No flowsheet data found.

## 2020-02-15 NOTE — Discharge Summary (Signed)
Date of admission: 02/13/2020  Date of discharge: 02/15/2020  Admission diagnosis: Hydronephrosis secondary to extrinsic ureteral compression from advanced prostate cancer, pseudomonal UTI  Discharge diagnosis: Same  Secondary diagnoses:  Patient Active Problem List   Diagnosis Date Noted  . Hydronephrosis 02/13/2020  . Loss of taste 01/31/2020  . Bone metastases ([place]) 12/31/2019  . Recurrent UTI 12/31/2019  . Malignant neoplasm of prostate ([place]) 08/17/2019  . Cancer involving bladder by direct extension from prostate ([place]) 06/28/2019  . HLD (hyperlipidemia) 08/02/2018  . Health maintenance examination 08/05/2017  . Anosmia 08/05/2017  . Family history of hemochromatosis 08/03/2016  . Medicare annual wellness visit, subsequent 07/29/2015  . Advanced care planning/counseling discussion 07/29/2015  . Bradycardia 07/29/2015  . History of cardiac monitoring 07/29/2015  . Syncopal episodes     Procedures performed:   History and Physical: For full details, please see admission history and physical. Briefly, Shane [name] is a 69 y.o. year old patient with history of advanced prostate cancer with associated bilateral hydroureteronephrosis.  He had stents placed back in July, and we opted to finally remove them in clinic 3 days ago.  The following day he presented to our office with nausea, fatigue, and fever.  He was noted to have bilateral hydronephrosis.  He was subsequently admitted to the hospital.  Hospital Course:  The patient was admitted directly from clinic.  He was started on IV cefepime.  Infectious disease was consulted and decided that he would be sent home with 2 weeks of IV antibiotics, cefepime twice daily.  He was given a PICC line prior to discharge.  Home health was set up for this.  He has follow-up with ID next week.  The patient also had bilateral nephrostomy tubes placed for his hydronephrosis.  Initially he had some hematuria in the right bag, but this resolved.   By the time of discharge she had clear yellow urine in both bags.  He was having some mild discomfort but otherwise doing okay.  The patient's mental status cleared significantly while he was in the hospital and being treated for his infection.  Laboratory values:  Recent Labs    02/14/20 0719 02/15/20 0444  WBC 6.7 6.4  HGB 9.8* 9.9*  HCT 31.4* 31.9*   Recent Labs    02/14/20 0719 02/15/20 0444  NA 137 139  K 3.8 4.4  CL 106 107  CO2 23 26  GLUCOSE 103* 102*  BUN 18 16  CREATININE 1.41* 1.32*  CALCIUM 8.8* 8.9   Recent Labs    02/13/20 1811  INR 1.2   No results for input(s): LABURIN in the last 72 hours. Results for orders placed or performed during the hospital encounter of 02/13/20  SARS CORONAVIRUS 2 (TAT 6-24 HRS) Nasopharyngeal Nasopharyngeal Swab     Status: None   Collection Time: 02/13/20  3:58 PM   Specimen: Nasopharyngeal Swab  Result Value Ref Range Status   SARS Coronavirus 2 NEGATIVE NEGATIVE Final    Comment: (NOTE) SARS-CoV-2 target nucleic acids are NOT DETECTED. The SARS-CoV-2 RNA is generally detectable in upper and lower respiratory specimens during the acute phase of infection. Negative results do not preclude SARS-CoV-2 infection, do not rule out co-infections with other pathogens, and should not be used as the sole basis for treatment or other patient management decisions. Negative results must be combined with clinical observations, patient history, and epidemiological information. The expected result is Negative. Fact Sheet for Patients: SugarRoll.be Fact Sheet for Healthcare Providers: https://www.woods-mathews.com/ This test is  not yet approved or cleared by the [place] and  has been authorized for detection and/or diagnosis of SARS-CoV-2 by FDA under an Emergency Use Authorization (EUA). This EUA will remain  in effect (meaning this test can be used) for the duration of the COVID-19  declaration under Section 56 4(b)(1) of the Act, 21 U.S.C. section 360bbb-3(b)(1), unless the authorization is terminated or revoked sooner. Performed at [facility], [place] [address] [place], [place] [address]   Culture, Urine     Status: Abnormal (Preliminary result)   Collection Time: 02/13/20  4:29 PM   Specimen: Urine, Random  Result Value Ref Range Status   Specimen Description   Final    URINE, RANDOM Performed at [facility] [address], [place], [place] [address]    Special Requests   Final    NONE Performed at [facility], [place] [address], [place], [place] [address]    Culture (A)  Final    10,000 COLONIES/mL GRAM NEGATIVE RODS 20,000 COLONIES/mL PSEUDOMONAS AERUGINOSA SUSCEPTIBILITIES TO FOLLOW Performed at [facility], [place] [address] [place], [place] [address]    Report Status PENDING  Incomplete  Culture, blood (routine x 2)     Status: None (Preliminary result)   Collection Time: 02/13/20  6:11 PM   Specimen: BLOOD  Result Value Ref Range Status   Specimen Description   Final    BLOOD LEFT ANTECUBITAL Performed at [facility] [address], [place], [place] [address]    Special Requests   Final    BOTTLES DRAWN AEROBIC AND ANAEROBIC Blood Culture adequate volume Performed at [facility] [address] [place], [place] [address]    Culture   Final    NO GROWTH 2 DAYS Performed at [facility] [address], [place], [place] [address]    Report Status PENDING  Incomplete  Culture, blood (routine x 2)     Status: None (Preliminary result)   Collection Time: 02/13/20  6:11 PM   Specimen: Arm; Blood  Result Value Ref Range Status   Specimen Description   Final    ARM LEFT LOWER Performed at [facility] [address], [place], [place] [address]    Special Requests   Final    BOTTLES DRAWN AEROBIC AND ANAEROBIC Blood Culture  adequate volume Performed at [facility] [address], [place], [place] [address]    Culture   Final    NO GROWTH 2 DAYS Performed at [facility] [address] [place],  [address]    Report Status PENDING  Incomplete    Disposition: Home  Discharge instruction: The patient was instructed to be ambulatory but told to refrain from heavy lifting, strenuous activity, or driving.   Discharge medications:  Allergies as of 02/15/2020      Reactions   Bactrim [sulfamethoxazole-trimethoprim] Hives   Red rash, hives   Hydrocodone    Percocet [oxycodone-acetaminophen] Other (See Comments)   TURNS RED FROM CHEST UP, LOOKS LIKE SUNBURN AND STARTS TO ITCH BADLY   Vancomycin    Penicillins Swelling, Rash   Did it involve swelling of the face/tongue/throat, SOB, or low BP? No Did it involve sudden or severe rash/hives, skin peeling, or any reaction on the inside of your mouth or nose? No Did you need to seek medical attention at a hospital or doctor's office? No When did it last happen?[place]  years ago If all above answers are "NO", may proceed with cephalosporin use.   Zithromax [azithromycin] Rash      Medication List    TAKE these medications   abiraterone acetate 250 MG tablet Commonly known as: Zytiga Take 4 tablets (1,000 mg total) by mouth daily. Take on an empty stomach 1 hour before or 2 hours after a meal   ceFEPime  IVPB Commonly known as: MAXIPIME Inject 2 g into the vein every 12 (twelve) hours for 12 days. Indication: pseudomonas UTI/pyelonephritis Last Day of Therapy:  02/27/2020 Labs - Once weekly:  CBC/D and BMP,   magic mouthwash Soln Take 5 mLs by mouth 3 (three) times daily as needed for mouth pain.   meloxicam 15 MG tablet Commonly known as: MOBIC Take 1 tablet (15 mg total) by mouth daily as needed for pain. What changed:   when to take this  reasons to take this   Myrbetriq 50 MG Tb24 tablet Generic drug:  mirabegron ER Take 50 mg by mouth daily.   nystatin 100000 UNIT/ML suspension Commonly known as: MYCOSTATIN Use as directed 5 mLs in the mouth or throat 4 (four) times daily as needed (mouth pain).   predniSONE 5 MG tablet Commonly known as: DELTASONE TAKE ONE TABLET BY MOUTH DAILY WITH BREAKFAST   tamsulosin 0.4 MG Caps capsule Commonly known as: FLOMAX Take 0.4 mg by mouth daily.   venlafaxine 25 MG tablet Commonly known as: EFFEXOR Take 25 mg by mouth daily.            Home Infusion Instuctions  (From admission, onward)         Start     Ordered   02/15/20 0000  Home infusion instructions    Question:  Instructions  Answer:  Flushing of vascular access device: 0.9% NaCl pre/post medication administration and prn patency; Heparin 100 u/ml, 53ml for implanted ports and Heparin 10u/ml, 32ml for all other central venous catheters.   02/15/20 1121          Followup:  Follow-up Information    [name], MD In 2 weeks.   Specialty: Urology Contact information: [place] [address] [phone]        Michel Bickers, MD On 02/20/2020.   Specialty: Infectious Diseases Contact information: [address] [place] [address] [phone]

## 2020-02-15 NOTE — Progress Notes (Signed)
Referring Physician(s): Herrick,B  Supervising Physician: Aletta Edouard  Patient Status:  [facility] - In-pt  Chief Complaint:  Back pain, fever, chills, urinary tract infection  Subjective: Patient doing well today; states plans are for possible discharge home today; has some bilateral flank soreness at PCN insertion sites; denies fever, respiratory problems, nausea or vomiting   Allergies: Bactrim [sulfamethoxazole-trimethoprim], Hydrocodone, Percocet [oxycodone-acetaminophen], Vancomycin, Penicillins, and Zithromax [azithromycin]  Medications: Prior to Admission medications   Medication Sig Start Date End Date Taking? Authorizing Provider  abiraterone acetate (ZYTIGA) 250 MG tablet Take 4 tablets (1,000 mg total) by mouth daily. Take on an empty stomach 1 hour before or 2 hours after a meal 12/13/19  Yes Shadad, Mathis Dad, MD  magic mouthwash SOLN Take 5 mLs by mouth 3 (three) times daily as needed for mouth pain. 01/23/20  Yes Michel Bickers, MD  MYRBETRIQ 50 MG TB24 tablet Take 50 mg by mouth daily. 11/19/19  Yes [provider]  predniSONE (DELTASONE) 5 MG tablet TAKE ONE TABLET BY MOUTH DAILY WITH BREAKFAST 02/05/20  Yes [name], MD  tamsulosin (FLOMAX) 0.4 MG CAPS capsule Take 0.4 mg by mouth daily. 05/02/19  Yes [provider]  venlafaxine (EFFEXOR) 25 MG tablet Take 25 mg by mouth daily. 10/17/19  Yes [provider]  ceFEPime (MAXIPIME) IVPB Inject 2 g into the vein every 12 (twelve) hours for 12 days. Indication: pseudomonas UTI/pyelonephritis Last Day of Therapy:  02/27/2020 Labs - Once weekly:  CBC/D and BMP, 02/15/20 02/27/20  [name], MD  meloxicam (MOBIC) 15 MG tablet Take 1 tablet (15 mg total) by mouth daily as needed for pain. 02/15/20   [name], MD  nystatin (MYCOSTATIN) 100000 UNIT/ML suspension Use as directed 5 mLs in the mouth or throat 4 (four) times daily as needed (mouth pain).  12/20/19   [provider]     Vital Signs: BP 109/66 (BP Location: Left Arm)   Pulse 64   Temp 98.9 F (37.2 C)   Resp 18   Ht 5\' 11"  (1.803 m)   Wt 175 lb (79.4 kg)   SpO2 95%   BMI 24.41 kg/m   Physical Exam awake, alert.  Bilateral PCNs intact; insertion sites clean and dry, mildly tender to palpation, output from left nephrostomy 1.2 L of slightly blood-tinged urine, 950 cc yellow urine from right nephrostomy  Imaging: CT ABDOMEN PELVIS WO CONTRAST  Result Date: 02/13/2020 CLINICAL DATA:  Hydronephrosis. Nausea and vomiting. History of bladder cancer. Hematuria. EXAM: CT ABDOMEN AND PELVIS WITHOUT CONTRAST TECHNIQUE: Multidetector CT imaging of the abdomen and pelvis was performed following the standard protocol without IV contrast. COMPARISON:  Renal ultrasound of 02/13/2020 and CT abdomen from 07/26/2019 FINDINGS: Lower chest: Stable 6 mm right lower lobe nodule on image 1/4. Hepatobiliary: 4.7 by 3.7 cm cyst in the right hepatic lobe on image 25/2, stable. The gallbladder appears unremarkable. Pancreas: Unremarkable Spleen: Unremarkable Adrenals/Urinary Tract: Stable calcifications along the medial limb of the left adrenal gland, no mass is identified. The previous double-J ureteral stents shown on 07/26/2019 have been removed. There is moderate right and prominent left hydronephrosis with bilateral hydroureter extending down to the urinary bladder mild stranding a along both collecting systems and proximal ureters. No stones are identified. Small amount of gas in the urinary bladder. I do not see obvious bladder wall thickening although today's exam was performed without IV contrast. Right kidney upper pole fluid density exophytic lesion measuring 2.4 by 2.0  cm compatible with stable cyst. Stomach/Bowel: Unremarkable Vascular/Lymphatic: Aortoiliac atherosclerotic vascular disease. Right external iliac lymph node 0.5 cm in short axis on image 68/2, previously 1.1 cm on 07/26/2019. Anterior left perirectal space  lymph node 0.6 cm in short axis on image 81/2, previously 1.6 cm in short axis. Left perirectal space lymph node previously measuring 0.7 cm in short axis is no longer visible. A presacral lymph node eccentric to the left previously measuring 0.7 cm in short axis is no longer visible. 0.4 cm right internal iliac lymph node on image 74/2, previously 1.3 cm. Reproductive: The prostate gland measures 3.9 by 3.4 by 3.8 cm (volume = 26 cm^3), previously 4.9 by 4.6 by 5.9 cm (volume = 70 cm^3). Other: No supplemental non-categorized findings. Musculoskeletal: Previous lytic metastatic lesion of the left inferior pubic ramus is now moderately sclerotic favoring interval healing response. Previously lytic lesion of the left posterior iliac bone is now sclerotic on image 65/2, measuring 2.3 by 1.5 cm. A sclerotic lesion in the left iliac bone on image 62/2 measures 1.2 by 0.7 cm and likewise also probably represents a treated metastatic lesion. A 0.7 cm sclerotic focus in the L1 vertebral body eccentric to the right is likely a previously treated sclerotic metastatic focus. IMPRESSION: 1. Moderate right and prominent left hydronephrosis with bilateral hydroureter extending down to the urinary bladder. No stones are identified. A specific cause for the hydronephrosis and hydroureter is not appreciated. The previous double-J ureteral stents have been intervally removed. 2. Reduced size of the prostate gland, currently 26 cubic cm, previously 70 cubic cm. 3. Reduced size of the pelvic lymph nodes common now currently measuring within normal size limits. 4. Lesions in the left iliac bone, left inferior pubic ramus, and L1 vertebral body are now sclerotic favoring interval healing response. 5. Stable 6 mm right lower lobe pulmonary nodule. 6. Stable hepatic and right renal cysts. 7. Small amount of gas in the urinary bladder, query recent catheterization. Aortic Atherosclerosis (ICD10-I70.0). Electronically Signed   By: Van Clines M.D.   On: 02/13/2020 17:14   IR NEPHROSTOMY PLACEMENT LEFT  Result Date: 02/14/2020 CLINICAL DATA:  Prostate carcinoma involving the bladder and causing recurrent ureteral obstruction and bilateral hydronephrosis. EXAM: BILATERAL PERCUTANEOUS NEPHROSTOMY TUBE PLACEMENT WITH ULTRASOUND AND FLUOROSCOPIC GUIDANCE COMPARISON:  None. ANESTHESIA/SEDATION: 2.5 mg IV Versed; 100 mcg IV Fentanyl. Total Moderate Sedation Time 30 minutes. The patient's level of consciousness and vital signs were monitored continuously by radiology nursing throughout the procedure under my direct supervision. CONTRAST:  10 mL Omnipaque 300 MEDICATIONS: The patient is receiving IV antibiotics and no additional antibiotics were administered for the procedure. FLUOROSCOPY TIME:  2 minutes and 18 seconds.  20.9 mGy. PROCEDURE: The procedure, risks, benefits, and alternatives were explained to the patient. Questions regarding the procedure were encouraged and answered. The patient understands and consents to the procedure. A time-out was performed prior to initiating the procedure. Bilateral flank regions were prepped with chlorhexidine in a sterile fashion, and a sterile drape was applied covering the operative field. A sterile gown and sterile gloves were used for the procedure. Local anesthesia was provided with 1% Lidocaine. Ultrasound was used to localize both kidneys. Under direct ultrasound guidance, a 21 gauge needle was advanced into the renal collecting system. Ultrasound image documentation was performed. Aspiration of urine sample was performed followed by contrast injection. A transitional dilator was advanced over a guidewire. Percutaneous tract dilatation was then performed over the guidewire. Bilateral 10 French percutaneous nephrostomy  tubes were then advanced and formed in the collecting system. Catheter position was confirmed by fluoroscopy after contrast injection. Both catheters were secured with Prolene  retention sutures, StatLock devices and attached to gravity bags. COMPLICATIONS: None. FINDINGS: Ultrasound demonstrates significant bilateral hydronephrosis, left greater than right. Bilateral nephrostomy tubes were placed and formed at the level of the renal pelvis bilaterally. There was some bleeding into the right renal collecting system after nephrostomy tube placement. IMPRESSION: Bilateral 10 French percutaneous nephrostomy tube placement. Both tubes were attached to gravity bags. Electronically Signed   By: Aletta Edouard M.D.   On: 02/14/2020 13:33   IR NEPHROSTOMY PLACEMENT RIGHT  Result Date: 02/14/2020 CLINICAL DATA:  Prostate carcinoma involving the bladder and causing recurrent ureteral obstruction and bilateral hydronephrosis. EXAM: BILATERAL PERCUTANEOUS NEPHROSTOMY TUBE PLACEMENT WITH ULTRASOUND AND FLUOROSCOPIC GUIDANCE COMPARISON:  None. ANESTHESIA/SEDATION: 2.5 mg IV Versed; 100 mcg IV Fentanyl. Total Moderate Sedation Time 30 minutes. The patient's level of consciousness and vital signs were monitored continuously by radiology nursing throughout the procedure under my direct supervision. CONTRAST:  10 mL Omnipaque 300 MEDICATIONS: The patient is receiving IV antibiotics and no additional antibiotics were administered for the procedure. FLUOROSCOPY TIME:  2 minutes and 18 seconds.  20.9 mGy. PROCEDURE: The procedure, risks, benefits, and alternatives were explained to the patient. Questions regarding the procedure were encouraged and answered. The patient understands and consents to the procedure. A time-out was performed prior to initiating the procedure. Bilateral flank regions were prepped with chlorhexidine in a sterile fashion, and a sterile drape was applied covering the operative field. A sterile gown and sterile gloves were used for the procedure. Local anesthesia was provided with 1% Lidocaine. Ultrasound was used to localize both kidneys. Under direct ultrasound guidance, a 21  gauge needle was advanced into the renal collecting system. Ultrasound image documentation was performed. Aspiration of urine sample was performed followed by contrast injection. A transitional dilator was advanced over a guidewire. Percutaneous tract dilatation was then performed over the guidewire. Bilateral 10 French percutaneous nephrostomy tubes were then advanced and formed in the collecting system. Catheter position was confirmed by fluoroscopy after contrast injection. Both catheters were secured with Prolene retention sutures, StatLock devices and attached to gravity bags. COMPLICATIONS: None. FINDINGS: Ultrasound demonstrates significant bilateral hydronephrosis, left greater than right. Bilateral nephrostomy tubes were placed and formed at the level of the renal pelvis bilaterally. There was some bleeding into the right renal collecting system after nephrostomy tube placement. IMPRESSION: Bilateral 10 French percutaneous nephrostomy tube placement. Both tubes were attached to gravity bags. Electronically Signed   By: Aletta Edouard M.D.   On: 02/14/2020 13:33   [place] EKG SITE RITE  Result Date: 02/15/2020 If Site Rite image not attached, placement could not be confirmed due to current cardiac rhythm.  [place] EKG SITE RITE  Result Date: 02/13/2020 If Site Rite image not attached, placement could not be confirmed due to current cardiac rhythm.   Labs:  CBC: Recent Labs    09/19/19 0901 09/19/19 0901 11/20/19 0951 01/08/20 0000 02/14/20 0719 02/15/20 0444  WBC 5.1   < > 6.4 6.3 6.7 6.4  HGB 10.2*   < > 10.7* 10.4* 9.8* 9.9*  HCT 31.5*  --  33.1*  --  31.4* 31.9*  PLT 262   < > 241 240 170 168   < > = values in this interval not displayed.    COAGS: Recent Labs    02/13/20 1811  INR 1.2  BMP: Recent Labs    09/19/19 0901 09/19/19 0901 11/20/19 0951 01/08/20 0000 02/14/20 0719 02/15/20 0444  NA 143   < > 140 141 137 139  K 4.3   < > 3.8 3.7 3.8 4.4  CL 108  --  107   --  106 107  CO2 26  --  25  --  23 26  GLUCOSE 95  --  98  --  103* 102*  BUN 25*  --  20  --  18 16  CALCIUM 9.8   < > 9.3 7.9* 8.8* 8.9  CREATININE 1.55*   < > 1.50* 1.2 1.41* 1.32*  GFRNONAA 45*  --  47*  --  50* 55*  GFRAA 52*  --  54*  --  58* >60   < > = values in this interval not displayed.    LIVER FUNCTION TESTS: Recent Labs    09/19/19 0901 11/20/19 0951 01/08/20 0000 02/14/20 0719  BILITOT 0.5 0.5  --  0.8  AST 17 22 16 27   ALT 16 16 11 17   ALKPHOS 66 60 47 47  PROT 6.7 7.0  --  6.0*  ALBUMIN 3.5 3.6 3.1* 3.0*    Assessment and Plan: Patient with history of advanced prostate cancer, bilateral hydronephrosis, refractory pseudomonal UTI/prostatitis; status post bilateral nephrostomies on 3/11; afebrile, creatinine 1.32 down from 1.41, WBC normal, hemoglobin 9.9, blood culture negative to date, urine culture with gram-negative rods/Pseudomonas susceptibilities pending; ID on board; further plans as outlined by urology.   Electronically Signed: D. Rowe Robert, PA-C 02/15/2020, 11:35 AM   I spent a total of 15 minutes at the the patient's bedside AND on the patient's hospital floor or unit, greater than 50% of which was counseling/coordinating care for bilateral nephrostomies    Patient ID: Shane [name], male   DOB: 1951-09-07, 69 y.o.   MRN: [id number]

## 2020-02-15 NOTE — Progress Notes (Signed)
Urology Inpatient Progress Report  Hydronephrosis [N13.30] Infection   Intv/Subj: No acute events overnight. Patient is without complaint. Neph tubes placed yesterday.  Feels better - more clear today.  Eating without nausea.  Has had some loose stools. Bladder and penile pain improving. Feels sore at neph tube insertion site  Active Problems:   Cancer involving bladder by direct extension from prostate [facility])   Malignant neoplasm of prostate ([place])   Recurrent UTI   Hydronephrosis  Current Facility-Administered Medications  Medication Dose Route Frequency Provider Last Rate Last Admin  . 0.45 % NaCl with KCl 20 mEq / L infusion   Intravenous Continuous [name], MD 75 mL/hr at 02/15/20 0110 New Bag at 02/15/20 0110  . abiraterone acetate (ZYTIGA) tablet 1,000 mg  1,000 mg Oral Daily [name], MD      . acetaminophen (TYLENOL) tablet 1,000 mg  1,000 mg Oral Q8H PRN [name], MD   1,000 mg at 02/14/20 1713  . ceFEPIme (MAXIPIME) 2 g in sodium chloride 0.9 % 100 mL IVPB  2 g Intravenous Q12H Lenis Noon, RPH 200 mL/hr at 02/14/20 2142 2 g at 02/14/20 2142  . enoxaparin (LOVENOX) injection 40 mg  40 mg Subcutaneous Q24H Allred, Darrell K, PA-C   40 mg at 02/14/20 1714  . loperamide (IMODIUM) capsule 2 mg  2 mg Oral PRN [name], MD      . magic mouthwash  5 mL Oral TID PRN [name], MD      . mirabegron ER [facility]) tablet 50 mg  50 mg Oral Daily [name], MD   50 mg at 02/14/20 0946  . ondansetron (ZOFRAN) tablet 4 mg  4 mg Oral Q6H PRN [name], MD       Or  . ondansetron [facility]) injection 4 mg  4 mg Intravenous Q6H PRN [name], MD      . predniSONE (DELTASONE) tablet 5 mg  5 mg Oral Q breakfast [name], MD      . sodium chloride flush (NS) 0.9 % injection 5 mL  5 mL Intracatheter Q8H Aletta Edouard, MD   5 mL at 02/15/20 0543  . tamsulosin (FLOMAX) capsule 0.4 mg  0.4 mg Oral  Daily [name], MD   0.4 mg at 02/14/20 0946  . traMADol (ULTRAM) tablet 50-100 mg  50-100 mg Oral Q6H PRN [name], MD   100 mg at 02/14/20 2147  . venlafaxine [facility]) tablet 25 mg  25 mg Oral Daily [name], MD   25 mg at 02/14/20 0946     Objective: Vital: Vitals:   02/14/20 1205 02/14/20 1550 02/14/20 2032 02/15/20 0543  BP: 120/65 122/63 110/62 109/66  Pulse: 68 67 63 64  Resp: 18 18 20 18   Temp: 98.1 F (36.7 C) 100.2 F (37.9 C) 98.9 F (37.2 C) 98.9 F (37.2 C)  TempSrc: Oral     SpO2: 100% 98% 97% 95%  Weight:      Height:       I/Os: I/O last 3 completed shifts: In: 2321 [I.V.:2111; Other:10; IV Piggyback:200] Out: 2750 [Urine:2750]  Physical Exam:  General: Patient is in no apparent distress Lungs: Normal respiratory effort, chest expands symmetrically. GI: The abdomen is soft and nontender without mass. Neph tube dressing c/d/i, tubes draining clear urine Foley: Condom cath - dark urine Ext: lower extremities symmetric  Lab Results: Recent Labs    02/14/20 0719 02/15/20  0444  WBC 6.7 6.4  HGB 9.8* 9.9*  HCT 31.4* 31.9*   Recent Labs    02/14/20 0719 02/15/20 0444  NA 137 139  K 3.8 4.4  CL 106 107  CO2 23 26  GLUCOSE 103* 102*  BUN 18 16  CREATININE 1.41* 1.32*  CALCIUM 8.8* 8.9   Recent Labs    02/13/20 1811  INR 1.2   No results for input(s): LABURIN in the last 72 hours. Results for orders placed or performed during the hospital encounter of 02/13/20  SARS CORONAVIRUS 2 (TAT 6-24 HRS) Nasopharyngeal Nasopharyngeal Swab     Status: None   Collection Time: 02/13/20  3:58 PM   Specimen: Nasopharyngeal Swab  Result Value Ref Range Status   SARS Coronavirus 2 NEGATIVE NEGATIVE Final    Comment: (NOTE) SARS-CoV-2 target nucleic acids are NOT DETECTED. The SARS-CoV-2 RNA is generally detectable in upper and lower respiratory specimens during the acute phase of infection. Negative results do not  preclude SARS-CoV-2 infection, do not rule out co-infections with other pathogens, and should not be used as the sole basis for treatment or other patient management decisions. Negative results must be combined with clinical observations, patient history, and epidemiological information. The expected result is Negative. Fact Sheet for Patients: SugarRoll.be Fact Sheet for Healthcare Providers: https://www.woods-mathews.com/ This test is not yet approved or cleared by the [place] FDA and  has been authorized for detection and/or diagnosis of SARS-CoV-2 by FDA under an Emergency Use Authorization (EUA). This EUA will remain  in effect (meaning this test can be used) for the duration of the COVID-19 declaration under Section 56 4(b)(1) of the Act, 21 U.S.C. section 360bbb-3(b)(1), unless the authorization is terminated or revoked sooner. Performed at [facility], [place] [address], [place], [place] [address]   Culture, Urine     Status: Abnormal (Preliminary result)   Collection Time: 02/13/20  4:29 PM   Specimen: Urine, Random  Result Value Ref Range Status   Specimen Description   Final    URINE, RANDOM Performed at [place] [address], [place], [place] [address]    Special Requests   Final    NONE Performed at [facility] [place] [facility], [place] [address], [place], [place] [address]    Culture 10,000 COLONIES/mL GRAM NEGATIVE RODS (A)  Final   Report Status PENDING  Incomplete  Culture, blood (routine x 2)     Status: None (Preliminary result)   Collection Time: 02/13/20  6:11 PM   Specimen: BLOOD  Result Value Ref Range Status   Specimen Description   Final    BLOOD LEFT ANTECUBITAL Performed at [place] [address], [place], [place] [address]    Special Requests   Final    BOTTLES DRAWN AEROBIC AND ANAEROBIC Blood Culture adequate volume Performed at [place] [address] [place], [place] [address]    Culture   Final    NO GROWTH 2 DAYS Performed at [place] [address], [place], [place] [address]    Report Status PENDING  Incomplete  Culture, blood (routine x 2)     Status: None (Preliminary result)   Collection Time: 02/13/20  6:11 PM   Specimen: Arm; Blood  Result Value Ref Range Status   Specimen Description   Final    ARM LEFT LOWER Performed at [place] [address], [place], [place] [address]    Special  Requests   Final    BOTTLES DRAWN AEROBIC AND ANAEROBIC Blood Culture adequate volume Performed at [place] [address], [place], [place] [address]    Culture   Final    NO GROWTH 2 DAYS Performed at [facility] [place] [address], [place], [place] [address]    Report Status PENDING  Incomplete    Studies/Results: CT ABDOMEN PELVIS WO CONTRAST  Result Date: 02/13/2020 CLINICAL DATA:  Hydronephrosis. Nausea and vomiting. History of bladder cancer. Hematuria. EXAM: CT ABDOMEN AND PELVIS WITHOUT CONTRAST TECHNIQUE: Multidetector CT imaging of the abdomen and pelvis was performed following the standard protocol without IV contrast. COMPARISON:  Renal ultrasound of 02/13/2020 and CT abdomen from 07/26/2019 FINDINGS: Lower chest: Stable 6 mm right lower lobe nodule on image 1/4. Hepatobiliary: 4.7 by 3.7 cm cyst in the right hepatic lobe on image 25/2, stable. The gallbladder appears unremarkable. Pancreas: Unremarkable Spleen: Unremarkable Adrenals/Urinary Tract: Stable calcifications along the medial limb of the left adrenal gland, no mass is identified. The previous double-J ureteral stents shown on 07/26/2019 have been removed. There is moderate right and prominent left hydronephrosis with bilateral hydroureter extending down to the urinary bladder mild stranding a along both collecting systems and proximal ureters. No stones are identified.  Small amount of gas in the urinary bladder. I do not see obvious bladder wall thickening although today's exam was performed without IV contrast. Right kidney upper pole fluid density exophytic lesion measuring 2.4 by 2.0 cm compatible with stable cyst. Stomach/Bowel: Unremarkable Vascular/Lymphatic: Aortoiliac atherosclerotic vascular disease. Right external iliac lymph node 0.5 cm in short axis on image 68/2, previously 1.1 cm on 07/26/2019. Anterior left perirectal space lymph node 0.6 cm in short axis on image 81/2, previously 1.6 cm in short axis. Left perirectal space lymph node previously measuring 0.7 cm in short axis is no longer visible. A presacral lymph node eccentric to the left previously measuring 0.7 cm in short axis is no longer visible. 0.4 cm right internal iliac lymph node on image 74/2, previously 1.3 cm. Reproductive: The prostate gland measures 3.9 by 3.4 by 3.8 cm (volume = 26 cm^3), previously 4.9 by 4.6 by 5.9 cm (volume = 70 cm^3). Other: No supplemental non-categorized findings. Musculoskeletal: Previous lytic metastatic lesion of the left inferior pubic ramus is now moderately sclerotic favoring interval healing response. Previously lytic lesion of the left posterior iliac bone is now sclerotic on image 65/2, measuring 2.3 by 1.5 cm. A sclerotic lesion in the left iliac bone on image 62/2 measures 1.2 by 0.7 cm and likewise also probably represents a treated metastatic lesion. A 0.7 cm sclerotic focus in the L1 vertebral body eccentric to the right is likely a previously treated sclerotic metastatic focus. IMPRESSION: 1. Moderate right and prominent left hydronephrosis with bilateral hydroureter extending down to the urinary bladder. No stones are identified. A specific cause for the hydronephrosis and hydroureter is not appreciated. The previous double-J ureteral stents have been intervally removed. 2. Reduced size of the prostate gland, currently 26 cubic cm, previously 70 cubic cm. 3.  Reduced size of the pelvic lymph nodes common now currently measuring within normal size limits. 4. Lesions in the left iliac bone, left inferior pubic ramus, and L1 vertebral body are now sclerotic favoring interval healing response. 5. Stable 6 mm right lower lobe pulmonary nodule. 6. Stable hepatic and right renal cysts. 7. Small amount of gas in the urinary bladder, query recent catheterization. Aortic Atherosclerosis (ICD10-I70.0). Electronically Signed  By: Van Clines M.D.   On: 02/13/2020 17:14   IR NEPHROSTOMY PLACEMENT LEFT  Result Date: 02/14/2020 CLINICAL DATA:  Prostate carcinoma involving the bladder and causing recurrent ureteral obstruction and bilateral hydronephrosis. EXAM: BILATERAL PERCUTANEOUS NEPHROSTOMY TUBE PLACEMENT WITH ULTRASOUND AND FLUOROSCOPIC GUIDANCE COMPARISON:  None. ANESTHESIA/SEDATION: 2.5 mg IV Versed; 100 mcg IV Fentanyl. Total Moderate Sedation Time 30 minutes. The patient's level of consciousness and vital signs were monitored continuously by radiology nursing throughout the procedure under my direct supervision. CONTRAST:  10 mL Omnipaque 300 MEDICATIONS: The patient is receiving IV antibiotics and no additional antibiotics were administered for the procedure. FLUOROSCOPY TIME:  2 minutes and 18 seconds.  20.9 mGy. PROCEDURE: The procedure, risks, benefits, and alternatives were explained to the patient. Questions regarding the procedure were encouraged and answered. The patient understands and consents to the procedure. A time-out was performed prior to initiating the procedure. Bilateral flank regions were prepped with chlorhexidine in a sterile fashion, and a sterile drape was applied covering the operative field. A sterile gown and sterile gloves were used for the procedure. Local anesthesia was provided with 1% Lidocaine. Ultrasound was used to localize both kidneys. Under direct ultrasound guidance, a 21 gauge needle was advanced into the renal collecting  system. Ultrasound image documentation was performed. Aspiration of urine sample was performed followed by contrast injection. A transitional dilator was advanced over a guidewire. Percutaneous tract dilatation was then performed over the guidewire. Bilateral 10 French percutaneous nephrostomy tubes were then advanced and formed in the collecting system. Catheter position was confirmed by fluoroscopy after contrast injection. Both catheters were secured with Prolene retention sutures, StatLock devices and attached to gravity bags. COMPLICATIONS: None. FINDINGS: Ultrasound demonstrates significant bilateral hydronephrosis, left greater than right. Bilateral nephrostomy tubes were placed and formed at the level of the renal pelvis bilaterally. There was some bleeding into the right renal collecting system after nephrostomy tube placement. IMPRESSION: Bilateral 10 French percutaneous nephrostomy tube placement. Both tubes were attached to gravity bags. Electronically Signed   By: Aletta Edouard M.D.   On: 02/14/2020 13:33   IR NEPHROSTOMY PLACEMENT RIGHT  Result Date: 02/14/2020 CLINICAL DATA:  Prostate carcinoma involving the bladder and causing recurrent ureteral obstruction and bilateral hydronephrosis. EXAM: BILATERAL PERCUTANEOUS NEPHROSTOMY TUBE PLACEMENT WITH ULTRASOUND AND FLUOROSCOPIC GUIDANCE COMPARISON:  None. ANESTHESIA/SEDATION: 2.5 mg IV Versed; 100 mcg IV Fentanyl. Total Moderate Sedation Time 30 minutes. The patient's level of consciousness and vital signs were monitored continuously by radiology nursing throughout the procedure under my direct supervision. CONTRAST:  10 mL Omnipaque 300 MEDICATIONS: The patient is receiving IV antibiotics and no additional antibiotics were administered for the procedure. FLUOROSCOPY TIME:  2 minutes and 18 seconds.  20.9 mGy. PROCEDURE: The procedure, risks, benefits, and alternatives were explained to the patient. Questions regarding the procedure were encouraged  and answered. The patient understands and consents to the procedure. A time-out was performed prior to initiating the procedure. Bilateral flank regions were prepped with chlorhexidine in a sterile fashion, and a sterile drape was applied covering the operative field. A sterile gown and sterile gloves were used for the procedure. Local anesthesia was provided with 1% Lidocaine. Ultrasound was used to localize both kidneys. Under direct ultrasound guidance, a 21 gauge needle was advanced into the renal collecting system. Ultrasound image documentation was performed. Aspiration of urine sample was performed followed by contrast injection. A transitional dilator was advanced over a guidewire. Percutaneous tract dilatation was then performed over the guidewire. Bilateral  10 French percutaneous nephrostomy tubes were then advanced and formed in the collecting system. Catheter position was confirmed by fluoroscopy after contrast injection. Both catheters were secured with Prolene retention sutures, StatLock devices and attached to gravity bags. COMPLICATIONS: None. FINDINGS: Ultrasound demonstrates significant bilateral hydronephrosis, left greater than right. Bilateral nephrostomy tubes were placed and formed at the level of the renal pelvis bilaterally. There was some bleeding into the right renal collecting system after nephrostomy tube placement. IMPRESSION: Bilateral 10 French percutaneous nephrostomy tube placement. Both tubes were attached to gravity bags. Electronically Signed   By: Aletta Edouard M.D.   On: 02/14/2020 13:33   [place] EKG SITE RITE  Result Date: 02/13/2020 If Site Rite image not attached, placement could not be confirmed due to current cardiac rhythm.   Assessment: Hydro bilaterally from extrinsic ureteral compression from advanced prostate cancer with presumed pseudomonas urinary tract infection.  Fortunately blood cultures were negative.  He's doing much better now that he's on the correct  abx and his has neph tubes. His CT scan shows improvement from his LAD/bone metastasis and he appears to be responding to his PRostate cancer treatment.  Plan: PICC line today Home with IV abx.  Will need home health for this and ABX will need to be set-up for him. Hoping to send him home today if ID agrees.   [name], MD Urology 02/15/2020, 7:38 AM

## 2020-02-15 NOTE — Progress Notes (Signed)
PHARMACY CONSULT NOTE FOR:  OUTPATIENT  PARENTERAL ANTIBIOTIC THERAPY (OPAT)  Indication: cefepime 2gm IV q12h Regimen: Pseudomonas UTI/pyelonephritis End date: 02/27/2020  IV antibiotic discharge orders are pended. To discharging provider:  please sign these orders via discharge navigator,  Select New Orders & click on the button choice - Manage This Unsigned Work.     Thank you for allowing pharmacy to be a part of this patient's care.  Doreene Eland, PharmD, BCPS.   Work Cell: [phone] 02/15/2020 9:31 AM

## 2020-02-15 NOTE — TOC Initial Note (Signed)
Transition of Care [facility]) - Initial/Assessment Note    Patient Details  Name: Shane [name] MRN: [id number] Date of Birth: 04-17-1951  Transition of [facility] CM/SW Contact:    Lennart Pall, LCSW Phone Number: 02/15/2020, 3:13 PM  Clinical Narrative:        Very pleasant gentleman who is eager to d/c home today with wife as primary support.  Awaiting education on home IV abx.  No further concerns.           Expected Discharge Plan: [place] Barriers to Discharge: Barriers Resolved   Patient Goals and CMS Choice Patient states their goals for this hospitalization and ongoing recovery are:: to go home CMS Medicare.gov Compare Post Acute Care list provided to:: Patient Choice offered to / list presented to : Patient  Expected Discharge Plan and Services Expected Discharge Plan: [place] In-house Referral: Clinical Social Work   Post Acute Care Choice: [place] arrangements for the past 2 months: [place] Expected Discharge Date: 02/15/20               DME Arranged: IV pump/equipment DME Agency: Other - Comment([facility]) Date DME Agency Contacted: 02/14/20   Representative spoke with at DME Agency: Carolynn Sayers HH Arranged: RN [place] Agency: [place] Date [place]: 02/15/20 Time HH Agency Contacted: 69 Representative spoke with at [place]: Sharmon Revere  Prior Living Arrangements/Services Living arrangements for the past 2 months: [place] with:: Spouse   Do you feel safe going back to the place where you live?: Yes      Need for Family Participation in Patient Care: Yes (Comment) Care giver support system in place?: Yes (comment)   Criminal Activity/Legal Involvement Pertinent to Current Situation/Hospitalization: No - Comment as needed  Activities of Daily Living Home Assistive Devices/Equipment: Eyeglasses ADL Screening (condition at time of  admission) Patient's cognitive ability adequate to safely complete daily activities?: Yes Is the patient deaf or have difficulty hearing?: No Does the patient have difficulty seeing, even when wearing glasses/contacts?: No Does the patient have difficulty concentrating, remembering, or making decisions?: No Patient able to express need for assistance with ADLs?: Yes Does the patient have difficulty dressing or bathing?: No Independently performs ADLs?: Yes (appropriate for developmental age) Does the patient have difficulty walking or climbing stairs?: No Weakness of Legs: None Weakness of Arms/Hands: None  Permission Sought/Granted Permission sought to share information with : Family Supports Permission granted to share information with : Yes, Verbal Permission Granted  Share Information with NAME: Darth Crismon  Permission granted to share info w AGENCY: [facility]  Permission granted to share info w Relationship: wife  Permission granted to share info w Contact Information: [phone]  Emotional Assessment Appearance:: Appears stated age, Well-Groomed Attitude/Demeanor/Rapport: Engaged, Self-Confident Affect (typically observed): Accepting, Adaptable, Happy Orientation: : Oriented to Self, Oriented to Place, Oriented to  Time   Psych Involvement: No (comment)  Admission diagnosis:  Hydronephrosis [N13.30] Patient Active Problem List   Diagnosis Date Noted  . Hydronephrosis 02/13/2020  . Loss of taste 01/31/2020  . Bone metastases ([place]) 12/31/2019  . Recurrent UTI 12/31/2019  . Malignant neoplasm of prostate ([place]) 08/17/2019  . Cancer involving bladder by direct extension from prostate ([place]) 06/28/2019  . HLD (hyperlipidemia) 08/02/2018  . Health maintenance examination 08/05/2017  . Anosmia 08/05/2017  . Family history of hemochromatosis 08/03/2016  . Medicare annual wellness visit, subsequent 07/29/2015  .  Advanced care planning/counseling  discussion 07/29/2015  . Bradycardia 07/29/2015  . History of cardiac monitoring 07/29/2015  . Syncopal episodes    PCP:  [name], MD Pharmacy:   [place], [place] [address] Phone: [phone] Fax: [phone]  [place], [place] [address] Phone: [phone] Fax: [phone]  [facility] [place], [place] [address] Phone: [phone] Fax: [phone]     Social Determinants of Health (SDOH) Interventions    Readmission Risk Interventions No flowsheet data found.

## 2020-02-15 NOTE — Progress Notes (Addendum)
Blood cultures ngtd and now afebrile, doing well. picc line order in. opat ordered.   [place] for d/c Discussed with home health and pharmacy Has follow up with Dr. Megan Salon already Thayer Headings, MD

## 2020-02-15 NOTE — Progress Notes (Signed)
Peripherally Inserted Central Catheter/Midline Placement  The IV Nurse has discussed with the patient and/or persons authorized to consent for the patient, the purpose of this procedure and the potential benefits and risks involved with this procedure.  The benefits include less needle sticks, lab draws from the catheter, and the patient may be discharged home with the catheter. Risks include, but not limited to, infection, bleeding, blood clot (thrombus formation), and puncture of an artery; nerve damage and irregular heartbeat and possibility to perform a PICC exchange if needed/ordered by physician.  Alternatives to this procedure were also discussed.  Bard Power PICC patient education guide, fact sheet on infection prevention and patient information card has been provided to patient /or left at bedside.    PICC/Midline Placement Documentation  PICC Single Lumen 02/15/20 PICC Right Brachial 42 cm (Active)  Indication for Insertion or Continuance of Line Home intravenous therapies (PICC only) 02/15/20 1400  Site Assessment Clean;Dry;Intact 02/15/20 1400  Line Status Flushed;Blood return noted 02/15/20 1400  Dressing Type Transparent 02/15/20 1400  Dressing Status Clean;Dry;Intact;Antimicrobial disc in place 02/15/20 1400  Dressing Change Due 02/22/20 02/15/20 1400       Jule Economy [place] 02/15/2020, 2:08 PM

## 2020-02-16 LAB — URINE CULTURE: Culture: 20000 — AB

## 2020-02-18 ENCOUNTER — Telehealth: Payer: Self-pay

## 2020-02-18 LAB — CULTURE, BLOOD (ROUTINE X 2)
Culture: NO GROWTH
Special Requests: ADEQUATE

## 2020-02-18 NOTE — Telephone Encounter (Signed)
Patient called office to follow up concerns he has with home health nursing. States he has tubes in his back that is supposed to be looked at everyday once a day. States home health has not been out to his house to do patient education.  Spoke with RN with [facility] who states they received a referral Saturday and were unable to verify insurance until today. Patient is scheduled to see RN tomorrow after dose of antibiotics. Will call patient to set this up.  [place]

## 2020-02-19 ENCOUNTER — Inpatient Hospital Stay: Payer: PPO | Attending: Oncology

## 2020-02-19 ENCOUNTER — Telehealth: Payer: Self-pay | Admitting: Oncology

## 2020-02-19 ENCOUNTER — Other Ambulatory Visit: Payer: Self-pay

## 2020-02-19 ENCOUNTER — Inpatient Hospital Stay (HOSPITAL_BASED_OUTPATIENT_CLINIC_OR_DEPARTMENT_OTHER): Payer: PPO | Admitting: Oncology

## 2020-02-19 ENCOUNTER — Encounter: Payer: Self-pay | Admitting: Internal Medicine

## 2020-02-19 VITALS — BP 126/70 | HR 62 | Temp 98.3°F | Resp 20 | Ht 71.0 in | Wt 182.8 lb

## 2020-02-19 DIAGNOSIS — N1330 Unspecified hydronephrosis: Secondary | ICD-10-CM | POA: Insufficient documentation

## 2020-02-19 DIAGNOSIS — C7951 Secondary malignant neoplasm of bone: Secondary | ICD-10-CM | POA: Diagnosis not present

## 2020-02-19 DIAGNOSIS — C7911 Secondary malignant neoplasm of bladder: Secondary | ICD-10-CM | POA: Insufficient documentation

## 2020-02-19 DIAGNOSIS — C61 Malignant neoplasm of prostate: Secondary | ICD-10-CM | POA: Diagnosis not present

## 2020-02-19 DIAGNOSIS — Z452 Encounter for adjustment and management of vascular access device: Secondary | ICD-10-CM | POA: Diagnosis not present

## 2020-02-19 DIAGNOSIS — Z5181 Encounter for therapeutic drug level monitoring: Secondary | ICD-10-CM | POA: Diagnosis not present

## 2020-02-19 DIAGNOSIS — Z436 Encounter for attention to other artificial openings of urinary tract: Secondary | ICD-10-CM | POA: Diagnosis not present

## 2020-02-19 DIAGNOSIS — N10 Acute tubulo-interstitial nephritis: Secondary | ICD-10-CM | POA: Diagnosis not present

## 2020-02-19 DIAGNOSIS — Z79899 Other long term (current) drug therapy: Secondary | ICD-10-CM | POA: Diagnosis not present

## 2020-02-19 DIAGNOSIS — B9689 Other specified bacterial agents as the cause of diseases classified elsewhere: Secondary | ICD-10-CM | POA: Diagnosis not present

## 2020-02-19 DIAGNOSIS — Z86711 Personal history of pulmonary embolism: Secondary | ICD-10-CM | POA: Diagnosis not present

## 2020-02-19 LAB — CBC WITH DIFFERENTIAL (CANCER CENTER ONLY)
Abs Immature Granulocytes: 0.86 10*3/uL — ABNORMAL HIGH (ref 0.00–0.07)
Basophils Absolute: 0.1 10*3/uL (ref 0.0–0.1)
Basophils Relative: 1 %
Eosinophils Absolute: 0.5 10*3/uL (ref 0.0–0.5)
Eosinophils Relative: 6 %
HCT: 32.1 % — ABNORMAL LOW (ref 39.0–52.0)
Hemoglobin: 10.2 g/dL — ABNORMAL LOW (ref 13.0–17.0)
Immature Granulocytes: 10 %
Lymphocytes Relative: 13 %
Lymphs Abs: 1.1 10*3/uL (ref 0.7–4.0)
MCH: 29.1 pg (ref 26.0–34.0)
MCHC: 31.8 g/dL (ref 30.0–36.0)
MCV: 91.5 fL (ref 80.0–100.0)
Monocytes Absolute: 0.6 10*3/uL (ref 0.1–1.0)
Monocytes Relative: 7 %
Neutro Abs: 5.5 10*3/uL (ref 1.7–7.7)
Neutrophils Relative %: 63 %
Platelet Count: 280 10*3/uL (ref 150–400)
RBC: 3.51 MIL/uL — ABNORMAL LOW (ref 4.22–5.81)
RDW: 12.9 % (ref 11.5–15.5)
WBC Count: 8.7 10*3/uL (ref 4.0–10.5)
nRBC: 0 % (ref 0.0–0.2)

## 2020-02-19 LAB — CMP (CANCER CENTER ONLY)
ALT: 27 U/L (ref 0–44)
AST: 33 U/L (ref 15–41)
Albumin: 3.3 g/dL — ABNORMAL LOW (ref 3.5–5.0)
Alkaline Phosphatase: 59 U/L (ref 38–126)
Anion gap: 8 (ref 5–15)
BUN: 20 mg/dL (ref 8–23)
CO2: 26 mmol/L (ref 22–32)
Calcium: 9.5 mg/dL (ref 8.9–10.3)
Chloride: 107 mmol/L (ref 98–111)
Creatinine: 1.3 mg/dL — ABNORMAL HIGH (ref 0.61–1.24)
GFR, Est AFR Am: >60 mL/min (ref >60)
GFR, Estimated: 56 mL/min — ABNORMAL LOW (ref >60)
Glucose, Bld: 94 mg/dL (ref 70–99)
Potassium: 3.9 mmol/L (ref 3.5–5.1)
Sodium: 141 mmol/L (ref 135–145)
Total Bilirubin: 0.5 mg/dL (ref 0.3–1.2)
Total Protein: 6.9 g/dL (ref 6.5–8.1)

## 2020-02-19 NOTE — Progress Notes (Signed)
Hematology and Oncology Follow Up Visit  Shane [name] [id number] Dec 18, 1950 69 y.o. 02/19/2020 8:46 AM Shane [name], MDGutierrez, Shane Hatchet, MD   Principle Diagnosis: 69 year old man with advanced prostate cancer with disease to the bone and adenopathy noted in July 2020.  He has castration-sensitive with Gleason score of 4+4 = 9 at the time of his initial diagnosis.   Prior Therapy:  He is status post TURBT and stent placement bilaterally the pathology showed Gleason score 5+4 = 9 prostate cancer invading into the bladder.  Current therapy:   Androgen deprivation under the care of Dr. [name].  Zytiga 1000 mg daily with prednisone 5 mg started in September 2020.  Interim History: Shane [name] is here for a repeat evaluation.  Since the last visit, he was hospitalized between March 10 and February 15, 2020 for Pseudomonas UTI and hydronephrosis.  He underwent bilateral nephrostomy tube placement.  Since his discharge, he reports feeling reasonably well without any major complications at this time.  He denies any nausea vomiting or abdominal pain.  He denies any fevers or chills.  He does report metallic taste related to continuous intravenous antibiotics.  He is currently on cefepime twice a day for the next 10 days.  He continues to tolerate Zytiga without any new complaints at this time.  Denies any lower extremity edema or excessive weakness.  He denies any increased blood pressure.         Medications: Reviewed without changes. Current Outpatient Medications  Medication Sig Dispense Refill  . abiraterone acetate (ZYTIGA) 250 MG tablet Take 4 tablets (1,000 mg total) by mouth daily. Take on an empty stomach 1 hour before or 2 hours after a meal 120 tablet 10  . ceFEPime (MAXIPIME) IVPB Inject 2 g into the vein every 12 (twelve) hours for 12 days. Indication: pseudomonas UTI/pyelonephritis Last Day of Therapy:  02/27/2020 Labs - Once weekly:  CBC/D and BMP, 25 Units 0  . magic  mouthwash SOLN Take 5 mLs by mouth 3 (three) times daily as needed for mouth pain. 150 mL 0  . meloxicam (MOBIC) 15 MG tablet Take 1 tablet (15 mg total) by mouth daily as needed for pain.    Marland Kitchen MYRBETRIQ 50 MG TB24 tablet Take 50 mg by mouth daily.    Marland Kitchen nystatin (MYCOSTATIN) 100000 UNIT/ML suspension Use as directed 5 mLs in the mouth or throat 4 (four) times daily as needed (mouth pain).     . predniSONE (DELTASONE) 5 MG tablet TAKE ONE TABLET BY MOUTH DAILY WITH BREAKFAST 90 tablet 0  . tamsulosin (FLOMAX) 0.4 MG CAPS capsule Take 0.4 mg by mouth daily.    Marland Kitchen venlafaxine (EFFEXOR) 25 MG tablet Take 25 mg by mouth daily.     No current facility-administered medications for this visit.     Allergies:  Allergies  Allergen Reactions  . Bactrim [Sulfamethoxazole-Trimethoprim] Hives    Red rash, hives  . Hydrocodone   . Percocet [Oxycodone-Acetaminophen] Other (See Comments)    TURNS RED FROM CHEST UP, LOOKS LIKE SUNBURN AND STARTS TO [place] BADLY  . Vancomycin   . Penicillins Swelling and Rash    Did it involve swelling of the face/tongue/throat, SOB, or low BP? No Did it involve sudden or severe rash/hives, skin peeling, or any reaction on the inside of your mouth or nose? No Did you need to seek medical attention at a hospital or doctor's office? No When did it last happen?50 years ago If all above answers are "NO", may proceed with  cephalosporin use.   Marland Kitchen Zithromax [Azithromycin] Rash        Physical Exam:  Blood pressure 126/70, pulse 62, temperature 98.3 F (36.8 C), temperature source Temporal, resp. rate 20, height 5\' 11"  (1.803 m), weight 182 lb 12.8 oz (82.9 kg), SpO2 100 %.   ECOG: 0     General appearance: Comfortable appearing without any discomfort Head: Normocephalic without any trauma Oropharynx: Mucous membranes are moist and pink without any thrush or ulcers. Eyes: Pupils are equal and round reactive to light. Lymph nodes: No cervical, supraclavicular,  inguinal or axillary lymphadenopathy.   Heart:regular rate and rhythm.  S1 and S2 without leg edema. Lung: Clear without any rhonchi or wheezes.  No dullness to percussion. Abdomin: Soft, nontender, nondistended with good bowel sounds.  No hepatosplenomegaly. Musculoskeletal: No joint deformity or effusion.  Full range of motion noted. Neurological: No deficits noted on motor, sensory and deep tendon reflex exam. Skin: No petechial rash or dryness.  Appeared moist.        Lab Results: Lab Results  Component Value Date   WBC 6.4 02/15/2020   HGB 9.9 (L) 02/15/2020   HCT 31.9 (L) 02/15/2020   MCV 95.2 02/15/2020   PLT 168 02/15/2020     Chemistry      Component Value Date/Time   NA 139 02/15/2020 0444   NA 141 01/08/2020 0000   K 4.4 02/15/2020 0444   CL 107 02/15/2020 0444   CO2 26 02/15/2020 0444   BUN 16 02/15/2020 0444   CREATININE 1.32 (H) 02/15/2020 0444   CREATININE 1.50 (H) 11/20/2019 0951   GLU 113 01/08/2020 0000      Component Value Date/Time   CALCIUM 8.9 02/15/2020 0444   ALKPHOS 47 02/14/2020 0719   AST 27 02/14/2020 0719   AST 22 11/20/2019 0951   ALT 17 02/14/2020 0719   ALT 16 11/20/2019 0951   BILITOT 0.8 02/14/2020 0719   BILITOT 0.5 11/20/2019 0951      Results for LINK, MCFEELEY (MRN [id number]) as of 02/19/2020 08:48  Ref. Range 09/19/2019 09:01 11/20/2019 09:51  Prostate Specific Ag, Serum Latest Ref Range: 0.0 - 4.0 ng/mL 0.8 0.2   IMPRESSION: 1. Moderate right and prominent left hydronephrosis with bilateral hydroureter extending down to the urinary bladder. No stones are identified. A specific cause for the hydronephrosis and hydroureter is not appreciated. The previous double-J ureteral stents have been intervally removed. 2. Reduced size of the prostate gland, currently 26 cubic cm, previously 70 cubic cm. 3. Reduced size of the pelvic lymph nodes common now currently measuring within normal size limits. 4. Lesions in the left  iliac bone, left inferior pubic ramus, and L1 vertebral body are now sclerotic favoring interval healing response. 5. Stable 6 mm right lower lobe pulmonary nodule. 6. Stable hepatic and right renal cysts. 7. Small amount of gas in the urinary bladder, query recent catheterization.  Impression and Plan:   69 year old with:  1.    Advanced prostate cancer with adenopathy and bone disease diagnosed in July 2020.  He has castration-sensitive disease at this time.  He remains on Zytiga without any major complications.  His PSA continues to show reasonable decline currently at 0.2 in December 2020.  CT scan obtained on February 13, 2020 was personally reviewed and showed response to therapy with resolution of his lymphadenopathy and improvement in his bone disease.  He did have worsening hydronephrosis.  Risks and benefits of continuing this treatment was discussed today and he is  agreeable to continue.    2.  Pulmonary embolism: He is status post Xarelto previously completed full dose anticoagulation.  3.  Bilateral hydronephrosis: Nephrostomy tube placed bilaterally with improvement in his kidney function.  4.  Hematuria: Resolved at this time.  5.  Androgen deprivation: He continues to receive that under the care of Dr. [name] and recommended to continue indefinitely.  6.  Follow-up: In 3 months for repeat evaluation.  30 minutes were dedicated to this visit.  The time was spent on reviewing his imaging studies, laboratory data, disease status update and coordinating future plan of care.  Zola Button, MD 3/16/20218:46 AM

## 2020-02-19 NOTE — Telephone Encounter (Signed)
Scheduled appts per 3/16 los. Gave pt a print out of AVS.

## 2020-02-20 ENCOUNTER — Encounter: Payer: Self-pay | Admitting: Internal Medicine

## 2020-02-20 ENCOUNTER — Telehealth: Payer: Self-pay

## 2020-02-20 ENCOUNTER — Ambulatory Visit (INDEPENDENT_AMBULATORY_CARE_PROVIDER_SITE_OTHER): Payer: PPO | Admitting: Internal Medicine

## 2020-02-20 DIAGNOSIS — N390 Urinary tract infection, site not specified: Secondary | ICD-10-CM

## 2020-02-20 LAB — CULTURE, BLOOD (ROUTINE X 2)
Culture: NO GROWTH
Special Requests: ADEQUATE

## 2020-02-20 LAB — PROSTATE-SPECIFIC AG, SERUM (LABCORP): Prostate Specific Ag, Serum: <0.1 ng/mL (ref 0.0–4.0)

## 2020-02-20 NOTE — Telephone Encounter (Signed)
-----   Message from [name], MD sent at 02/20/2020  8:45 AM EDT ----- Please let him know his PSA is low

## 2020-02-20 NOTE — Telephone Encounter (Signed)
Called and informed patient of PSA level. Patient verbalized understanding.  

## 2020-02-20 NOTE — Assessment & Plan Note (Signed)
He is improving on therapy for symptomatic Pseudomonas UTI.  He will complete therapy on 02/27/2020 and have his PICC removed.  He will follow-up here in 4 to 5 weeks.

## 2020-02-20 NOTE — Progress Notes (Signed)
[facility] for Infectious Disease  Patient Active Problem List   Diagnosis Date Noted  . Recurrent UTI 12/31/2019    Priority: High  . Bone metastases ([place]) 12/31/2019    Priority: Medium  . Malignant neoplasm of prostate ([place]) 08/17/2019    Priority: Medium  . Cancer involving bladder by direct extension from prostate ([place]) 06/28/2019    Priority: Medium  . Hydronephrosis 02/13/2020  . Loss of taste 01/31/2020  . HLD (hyperlipidemia) 08/02/2018  . Health maintenance examination 08/05/2017  . Anosmia 08/05/2017  . Family history of hemochromatosis 08/03/2016  . Medicare annual wellness visit, subsequent 07/29/2015  . Advanced care planning/counseling discussion 07/29/2015  . Bradycardia 07/29/2015  . History of cardiac monitoring 07/29/2015  . Syncopal episodes     Patient's Medications  New Prescriptions   No medications on file  Previous Medications   ABIRATERONE ACETATE (ZYTIGA) 250 MG TABLET    Take 4 tablets (1,000 mg total) by mouth daily. Take on an empty stomach 1 hour before or 2 hours after a meal   CEFEPIME (MAXIPIME) IVPB    Inject 2 g into the vein every 12 (twelve) hours for 12 days. Indication: pseudomonas UTI/pyelonephritis Last Day of Therapy:  02/27/2020 Labs - Once weekly:  CBC/D and BMP,   MAGIC MOUTHWASH SOLN    Take 5 mLs by mouth 3 (three) times daily as needed for mouth pain.   MELOXICAM (MOBIC) 15 MG TABLET    Take 1 tablet (15 mg total) by mouth daily as needed for pain.   MYRBETRIQ 50 MG TB24 TABLET    Take 50 mg by mouth daily.   NYSTATIN (MYCOSTATIN) 100000 UNIT/ML SUSPENSION    Use as directed 5 mLs in the mouth or throat 4 (four) times daily as needed (mouth pain).    PREDNISONE (DELTASONE) 5 MG TABLET    TAKE ONE TABLET BY MOUTH DAILY WITH BREAKFAST   TAMSULOSIN (FLOMAX) 0.4 MG CAPS CAPSULE    Take 0.4 mg by mouth daily.   VENLAFAXINE (EFFEXOR) 25 MG TABLET    Take 25 mg by mouth daily.  Modified Medications   No medications on  file  Discontinued Medications   No medications on file    Subjective: Shane [name] is in for his routine follow-up visit.  He had his bilateral ureteral stents removed on 02/12/2020.  Within 24 hours he had developed high fever to 103 degrees and bilateral hydronephrosis leading to readmission to the hospital.  He had bilateral percutaneous nephrostomy tubes placed.  Urine culture grew Pseudomonas again.  A PICC was placed and he was discharged on IV cefepime with a plan for 2 weeks total therapy through 02/27/2020.  Other than some discomfort from his right nephrostomy tube he is feeling dramatically better.  He is no longer having any suprapubic pain.  When he does pass urine he no longer has any dysuria.  He has not had any problems tolerating his PICC or cefepime.  Review of Systems: Review of Systems  Constitutional: Negative for chills, diaphoresis and fever.  Gastrointestinal: Negative for abdominal pain, diarrhea, nausea and vomiting.  Genitourinary: Negative for dysuria.    Past Medical History:  Diagnosis Date  . Cancer involving bladder by direct extension from prostate ([place]) 06/28/2019   Cystoscopy [name]) - 2 bladder tumors, 1 on anterior prostate planned TURBT/TURP 06/2019 Biopsy - Gleason 5+4=9 prostate cancer Firmagon started 07/2019 planned metastatic survey  . History of chicken pox   . Lipoma of  neck   . Seasonal allergies   . Syncopal episodes 2007   x 2 s/p unrevealing hospitalization without recurrence    Social History   Tobacco Use  . Smoking status: Never Smoker  . Smokeless tobacco: Never Used  Substance Use Topics  . Alcohol use: Yes    Alcohol/week: 0.0 standard drinks    Comment: 1 beer most evenings  . Drug use: No    Family History  Problem Relation Age of Onset  . Hyperlipidemia Mother   . Cancer Mother        peritoneal cancer  . Hemochromatosis Father        s/p liver transplant - pt screened negative 07/2015  . Hemochromatosis Paternal Grandmother    . Hemochromatosis Paternal Uncle   . Stroke Neg Hx   . Diabetes Neg Hx   . CAD Neg Hx     Allergies  Allergen Reactions  . Bactrim [Sulfamethoxazole-Trimethoprim] Hives    Red rash, hives  . Hydrocodone   . Percocet [Oxycodone-Acetaminophen] Other (See Comments)    TURNS RED FROM CHEST UP, LOOKS LIKE SUNBURN AND STARTS TO [place] BADLY  . Vancomycin   . Penicillins Swelling and Rash    Did it involve swelling of the face/tongue/throat, SOB, or low BP? No Did it involve sudden or severe rash/hives, skin peeling, or any reaction on the inside of your mouth or nose? No Did you need to seek medical attention at a hospital or doctor's office? No When did it last happen?50 years ago If all above answers are "NO", may proceed with cephalosporin use.   . Zithromax [Azithromycin] Rash    Objective: Vitals:   02/20/20 1552  BP: 135/71  Pulse: 66  Temp: 97.8 F (36.6 C)  TempSrc: Oral  SpO2: 98%  Weight: 181 lb (82.1 kg)   Body mass index is 25.24 kg/m.  Physical Exam Constitutional:      Comments: He is in good spirits.  Abdominal:     Palpations: Abdomen is soft.     Tenderness: There is no abdominal tenderness. There is no right CVA tenderness or left CVA tenderness.      Problem List Items Addressed This Visit      High   Recurrent UTI    He is improving on therapy for symptomatic Pseudomonas UTI.  He will complete therapy on 02/27/2020 and have his PICC removed.  He will follow-up here in 4 to 5 weeks.          Michel Bickers, MD [facility] for Infect[facility] [place] [facility] [phone] pager   [phone] cell 02/20/2020, 4:17 PM

## 2020-02-21 ENCOUNTER — Telehealth: Payer: Self-pay

## 2020-02-21 NOTE — Telephone Encounter (Signed)
Called Mary @  [facility] to confirm abx end date and to give verbal orders to pull PICC after last dose on 3/24. Spoke with patients wife to confirm that they are to give last doses and that the [facility] team will be out to pull PICC upon completion.   Azai Gaffin Lorita Officer, RN

## 2020-02-22 DIAGNOSIS — C61 Malignant neoplasm of prostate: Secondary | ICD-10-CM | POA: Diagnosis not present

## 2020-02-22 DIAGNOSIS — N390 Urinary tract infection, site not specified: Secondary | ICD-10-CM | POA: Diagnosis not present

## 2020-02-22 DIAGNOSIS — C7911 Secondary malignant neoplasm of bladder: Secondary | ICD-10-CM | POA: Diagnosis not present

## 2020-02-22 DIAGNOSIS — B965 Pseudomonas (aeruginosa) (mallei) (pseudomallei) as the cause of diseases classified elsewhere: Secondary | ICD-10-CM | POA: Diagnosis not present

## 2020-02-22 DIAGNOSIS — C7951 Secondary malignant neoplasm of bone: Secondary | ICD-10-CM | POA: Diagnosis not present

## 2020-02-25 ENCOUNTER — Encounter: Payer: Self-pay | Admitting: Internal Medicine

## 2020-02-25 ENCOUNTER — Encounter: Payer: Self-pay | Admitting: Family Medicine

## 2020-02-25 DIAGNOSIS — Z86718 Personal history of other venous thrombosis and embolism: Secondary | ICD-10-CM | POA: Insufficient documentation

## 2020-02-25 DIAGNOSIS — N10 Acute tubulo-interstitial nephritis: Secondary | ICD-10-CM | POA: Diagnosis not present

## 2020-03-04 DIAGNOSIS — N130 Hydronephrosis with ureteropelvic junction obstruction: Secondary | ICD-10-CM | POA: Diagnosis not present

## 2020-03-04 DIAGNOSIS — C778 Secondary and unspecified malignant neoplasm of lymph nodes of multiple regions: Secondary | ICD-10-CM | POA: Diagnosis not present

## 2020-03-04 DIAGNOSIS — C61 Malignant neoplasm of prostate: Secondary | ICD-10-CM | POA: Diagnosis not present

## 2020-03-06 DIAGNOSIS — Z436 Encounter for attention to other artificial openings of urinary tract: Secondary | ICD-10-CM | POA: Diagnosis not present

## 2020-03-06 DIAGNOSIS — N10 Acute tubulo-interstitial nephritis: Secondary | ICD-10-CM | POA: Diagnosis not present

## 2020-03-06 DIAGNOSIS — Z5181 Encounter for therapeutic drug level monitoring: Secondary | ICD-10-CM | POA: Diagnosis not present

## 2020-03-06 DIAGNOSIS — N130 Hydronephrosis with ureteropelvic junction obstruction: Secondary | ICD-10-CM | POA: Diagnosis not present

## 2020-03-06 DIAGNOSIS — C61 Malignant neoplasm of prostate: Secondary | ICD-10-CM | POA: Diagnosis not present

## 2020-03-06 DIAGNOSIS — Z452 Encounter for adjustment and management of vascular access device: Secondary | ICD-10-CM | POA: Diagnosis not present

## 2020-03-06 DIAGNOSIS — C7911 Secondary malignant neoplasm of bladder: Secondary | ICD-10-CM | POA: Diagnosis not present

## 2020-03-06 DIAGNOSIS — B9689 Other specified bacterial agents as the cause of diseases classified elsewhere: Secondary | ICD-10-CM | POA: Diagnosis not present

## 2020-03-06 DIAGNOSIS — C7951 Secondary malignant neoplasm of bone: Secondary | ICD-10-CM | POA: Diagnosis not present

## 2020-03-13 DIAGNOSIS — N130 Hydronephrosis with ureteropelvic junction obstruction: Secondary | ICD-10-CM | POA: Diagnosis not present

## 2020-04-07 ENCOUNTER — Telehealth: Payer: Self-pay | Admitting: Family Medicine

## 2020-04-07 NOTE — Telephone Encounter (Signed)
Updated pt's chart.  

## 2020-04-07 NOTE — Telephone Encounter (Signed)
Patient's wife called today to have the patient's vaccines updated.  She stated he had his 2nd Shingrix Vaccine on 4/20 @ [place]

## 2020-04-08 ENCOUNTER — Ambulatory Visit: Payer: PPO | Admitting: Internal Medicine

## 2020-04-08 ENCOUNTER — Encounter: Payer: Self-pay | Admitting: Internal Medicine

## 2020-04-08 ENCOUNTER — Other Ambulatory Visit: Payer: Self-pay

## 2020-04-08 DIAGNOSIS — N390 Urinary tract infection, site not specified: Secondary | ICD-10-CM

## 2020-04-08 NOTE — Progress Notes (Signed)
[facility] for Infectious Disease  Patient Active Problem List   Diagnosis Date Noted  . Recurrent UTI 12/31/2019    Priority: High  . Bone metastases ([place]) 12/31/2019    Priority: Medium  . Malignant neoplasm of prostate ([place]) 08/17/2019    Priority: Medium  . Cancer involving bladder by direct extension from prostate ([place]) 06/28/2019    Priority: Medium  . History of DVT of lower extremity 02/25/2020  . Hydronephrosis 02/13/2020  . Loss of taste 01/31/2020  . HLD (hyperlipidemia) 08/02/2018  . Health maintenance examination 08/05/2017  . Anosmia 08/05/2017  . Family history of hemochromatosis 08/03/2016  . Medicare annual wellness visit, subsequent 07/29/2015  . Advanced care planning/counseling discussion 07/29/2015  . Bradycardia 07/29/2015  . History of cardiac monitoring 07/29/2015  . Syncopal episodes     Patient's Medications  New Prescriptions   No medications on file  Previous Medications   ABIRATERONE ACETATE (ZYTIGA) 250 MG TABLET    Take 4 tablets (1,000 mg total) by mouth daily. Take on an empty stomach 1 hour before or 2 hours after a meal   MELOXICAM (MOBIC) 15 MG TABLET    Take 1 tablet (15 mg total) by mouth daily as needed for pain.   MYRBETRIQ 50 MG TB24 TABLET    Take 50 mg by mouth daily.   PREDNISONE (DELTASONE) 5 MG TABLET    TAKE ONE TABLET BY MOUTH DAILY WITH BREAKFAST   TAMSULOSIN (FLOMAX) 0.4 MG CAPS CAPSULE    Take 0.4 mg by mouth daily.   VENLAFAXINE (EFFEXOR) 25 MG TABLET    Take 25 mg by mouth daily.  Modified Medications   No medications on file  Discontinued Medications   MAGIC MOUTHWASH SOLN    Take 5 mLs by mouth 3 (three) times daily as needed for mouth pain.   NYSTATIN (MYCOSTATIN) 100000 UNIT/ML SUSPENSION    Use as directed 5 mLs in the mouth or throat 4 (four) times daily as needed (mouth pain).     Subjective: Shane [name] completed 2 weeks of IV cefepime for Pseudomonas UTI on 02/27/2020.  He had his bilateral  nephrostomy tubes removed about 3 weeks ago.  He is feeling much better.  He has not had any further fever, dysuria or back pain.  Review of Systems: Review of Systems  Constitutional: Negative for fever.  Gastrointestinal: Negative for abdominal pain, diarrhea, nausea and vomiting.  Genitourinary: Negative for dysuria, flank pain and urgency.    Past Medical History:  Diagnosis Date  . Cancer involving bladder by direct extension from prostate ([place]) 06/28/2019   Cystoscopy [name]) - 2 bladder tumors, 1 on anterior prostate planned TURBT/TURP 06/2019 Biopsy - Gleason 5+4=9 prostate cancer Firmagon started 07/2019 planned metastatic survey  . History of chicken pox   . Lipoma of neck   . Seasonal allergies   . Syncopal episodes 2007   x 2 s/p unrevealing hospitalization without recurrence    Social History   Tobacco Use  . Smoking status: Never Smoker  . Smokeless tobacco: Never Used  Substance Use Topics  . Alcohol use: Yes    Alcohol/week: 0.0 standard drinks    Comment: 1 beer most evenings  . Drug use: No    Family History  Problem Relation Age of Onset  . Hyperlipidemia Mother   . Cancer Mother        peritoneal cancer  . Hemochromatosis Father        s/p liver transplant -  pt screened negative 07/2015  . Hemochromatosis Paternal Grandmother   . Hemochromatosis Paternal Uncle   . Stroke Neg Hx   . Diabetes Neg Hx   . CAD Neg Hx     Allergies  Allergen Reactions  . Bactrim [Sulfamethoxazole-Trimethoprim] Hives    Red rash, hives  . Hydrocodone   . Percocet [Oxycodone-Acetaminophen] Other (See Comments)    TURNS RED FROM CHEST UP, LOOKS LIKE SUNBURN AND STARTS TO [place] BADLY  . Vancomycin   . Penicillins Swelling and Rash    Did it involve swelling of the face/tongue/throat, SOB, or low BP? No Did it involve sudden or severe rash/hives, skin peeling, or any reaction on the inside of your mouth or nose? No Did you need to seek medical attention at a hospital or  doctor's office? No When did it last happen?50 years ago If all above answers are "NO", may proceed with cephalosporin use.   . Zithromax [Azithromycin] Rash    Objective: Vitals:   04/08/20 0924  BP: 138/72  Pulse: 63  Temp: 98 F (36.7 C)  SpO2: 99%  Weight: 180 lb (81.6 kg)  Height: 5' 11.5" (1.816 m)   Body mass index is 24.76 kg/m.  Physical Exam Constitutional:      Comments: He is smiling and in good spirits.  Cardiovascular:     Rate and Rhythm: Normal rate and regular rhythm.     Heart sounds: No murmur.  Pulmonary:     Effort: Pulmonary effort is normal.     Breath sounds: Normal breath sounds.  Abdominal:     Palpations: Abdomen is soft.     Tenderness: There is no abdominal tenderness. There is no right CVA tenderness or left CVA tenderness.  Psychiatric:        Mood and Affect: Mood normal.     Lab Results    Problem List Items Addressed This Visit      High   Recurrent UTI    His Pseudomonas UTI has been cured.  He can follow-up here as needed.          Michel Bickers, MD [facility] for [facility] [place] [facility] [phone] pager   [phone] cell 04/08/2020, 9:43 AM

## 2020-04-08 NOTE — Assessment & Plan Note (Signed)
His Pseudomonas UTI has been cured.  He can follow-up here as needed.

## 2020-04-14 ENCOUNTER — Telehealth: Payer: Self-pay | Admitting: Family Medicine

## 2020-04-14 NOTE — Telephone Encounter (Signed)
Patient called to r/s AWV. He stated that due to him finding out his had cancer that he needed to cancel He wanted to know if you thought he still needed the lab work done since he has had several labs done at other offices.  Please advise

## 2020-04-15 NOTE — Telephone Encounter (Signed)
We last saw him 11/2018 but he is regularly seeing urology and oncology.  I do check some things at physical that they may not check such as cholesterol levels, but reasonable to postpone at this time. Would suggest reschedule for end of the year.  Hopefully he's doing well in interim - I'm glad picc line is out (removed 02/2020).

## 2020-04-16 NOTE — Telephone Encounter (Signed)
Spoke with pt/pt's wife, [name] relaying Dr. Synthia Innocent message.  Says there was some misunderstanding in pt's message to Dr. Darnell Level.  Pt wants to keep wellness visit with Dr. Darnell Level on 05/01/20.  He was asking about having lab visit for that appointment.  Plz schedule pt for appt with Calandra and fasting lab visit before 05/01/20 with Dr. Darnell Level.

## 2020-04-17 ENCOUNTER — Telehealth: Payer: Self-pay | Admitting: Family Medicine

## 2020-04-17 NOTE — Telephone Encounter (Signed)
Called patient and got him scheduled for cpe labs and AWV.

## 2020-04-17 NOTE — Telephone Encounter (Signed)
Noted  

## 2020-04-24 ENCOUNTER — Ambulatory Visit (INDEPENDENT_AMBULATORY_CARE_PROVIDER_SITE_OTHER): Payer: PPO

## 2020-04-24 ENCOUNTER — Other Ambulatory Visit (INDEPENDENT_AMBULATORY_CARE_PROVIDER_SITE_OTHER): Payer: PPO

## 2020-04-24 ENCOUNTER — Other Ambulatory Visit: Payer: Self-pay | Admitting: Family Medicine

## 2020-04-24 DIAGNOSIS — E7800 Pure hypercholesterolemia, unspecified: Secondary | ICD-10-CM | POA: Diagnosis not present

## 2020-04-24 DIAGNOSIS — Z0000 Encounter for general adult medical examination without abnormal findings: Secondary | ICD-10-CM | POA: Diagnosis not present

## 2020-04-24 DIAGNOSIS — C61 Malignant neoplasm of prostate: Secondary | ICD-10-CM

## 2020-04-24 LAB — CBC WITH DIFFERENTIAL/PLATELET
Basophils Absolute: 0 10*3/uL (ref 0.0–0.1)
Basophils Relative: 0.7 % (ref 0.0–3.0)
Eosinophils Absolute: 0.3 10*3/uL (ref 0.0–0.7)
Eosinophils Relative: 6.9 % — ABNORMAL HIGH (ref 0.0–5.0)
HCT: 35.1 % — ABNORMAL LOW (ref 39.0–52.0)
Hemoglobin: 11.6 g/dL — ABNORMAL LOW (ref 13.0–17.0)
Lymphocytes Relative: 28 % (ref 12.0–46.0)
Lymphs Abs: 1.3 10*3/uL (ref 0.7–4.0)
MCHC: 32.9 g/dL (ref 30.0–36.0)
MCV: 92.1 fl (ref 78.0–100.0)
Monocytes Absolute: 0.5 10*3/uL (ref 0.1–1.0)
Monocytes Relative: 10.4 % (ref 3.0–12.0)
Neutro Abs: 2.6 10*3/uL (ref 1.4–7.7)
Neutrophils Relative %: 54 % (ref 43.0–77.0)
Platelets: 219 10*3/uL (ref 150.0–400.0)
RBC: 3.8 Mil/uL — ABNORMAL LOW (ref 4.22–5.81)
RDW: 14.8 % (ref 11.5–15.5)
WBC: 4.8 10*3/uL (ref 4.0–10.5)

## 2020-04-24 LAB — COMPREHENSIVE METABOLIC PANEL
ALT: 12 U/L (ref 0–53)
AST: 17 U/L (ref 0–37)
Albumin: 4.2 g/dL (ref 3.5–5.2)
Alkaline Phosphatase: 45 U/L (ref 39–117)
BUN: 23 mg/dL (ref 6–23)
CO2: 28 mEq/L (ref 19–32)
Calcium: 9.7 mg/dL (ref 8.4–10.5)
Chloride: 105 mEq/L (ref 96–112)
Creatinine, Ser: 1.23 mg/dL (ref 0.40–1.50)
GFR: 58.22 mL/min — ABNORMAL LOW (ref >60.00)
Glucose, Bld: 95 mg/dL (ref 70–99)
Potassium: 4.4 mEq/L (ref 3.5–5.1)
Sodium: 139 mEq/L (ref 135–145)
Total Bilirubin: 0.5 mg/dL (ref 0.2–1.2)
Total Protein: 6.6 g/dL (ref 6.0–8.3)

## 2020-04-24 LAB — LIPID PANEL
Cholesterol: 196 mg/dL (ref 0–200)
HDL: 51.9 mg/dL (ref >39.00)
LDL Cholesterol: 122 mg/dL — ABNORMAL HIGH (ref 0–99)
NonHDL: 143.64
Total CHOL/HDL Ratio: 4
Triglycerides: 106 mg/dL (ref 0.0–149.0)
VLDL: 21.2 mg/dL (ref 0.0–40.0)

## 2020-04-24 LAB — PSA, MEDICARE: PSA: 0.05 ng/ml — ABNORMAL LOW (ref 0.10–4.00)

## 2020-04-24 NOTE — Progress Notes (Signed)
PCP notes:  Health Maintenance: Cologuard due   Abnormal Screenings: none   Patient concerns: none   Nurse concerns: none   Next PCP appt.: 05/01/2020 @ 9 am

## 2020-04-24 NOTE — Progress Notes (Signed)
Subjective:   Bryshere Mcferren is a 69 y.o. male who presents for Medicare Annual/Subsequent preventive examination.  Review of Systems: N/A   I connected with the patient today by telephone and verified that I am speaking with the correct person using two identifiers. Location patient: home Location nurse: work Persons participating in the virtual visit: patient, Marine scientist.   I discussed the limitations, risks, security and privacy concerns of performing an evaluation and management service by telephone and the availability of in person appointments. I also discussed with the patient that there may be a patient responsible charge related to this service. The patient expressed understanding and verbally consented to this telephonic visit.    Interactive audio and video telecommunications were attempted between this nurse and patient, however failed, due to patient having technical difficulties OR patient did not have access to video capability.  We continued and completed visit with audio only.     Cardiac Risk Factors include: advanced age (>44men, >76 women);dyslipidemia;male gender     Objective:    Vitals: There were no vitals taken for this visit.  There is no height or weight on file to calculate BMI.  Advanced Directives 02/19/2020 02/13/2020 07/06/2019 07/04/2019 02/25/2016 02/09/2016 02/03/2016  Does Patient Have a Medical Advance Directive? Yes Yes No No No No No  Type of Advance Directive Living will Living will - - - - -  Does patient want to make changes to medical advance directive? No - Patient declined No - Patient declined - - - - -  Would patient like information on creating a medical advance directive? - - No - Patient declined Yes (MAU/Ambulatory/Procedural Areas - Information given) No - patient declined information No - patient declined information -    Tobacco Social History   Tobacco Use  Smoking Status Never Smoker  Smokeless Tobacco Never Used     Counseling  given: Not Answered   Clinical Intake:  Pre-visit preparation completed: Yes  Pain : No/denies pain Pain Score: 0-No pain     Nutritional Risks: None Diabetes: No  How often do you need to have someone help you when you read instructions, pamphlets, or other written materials from your doctor or pharmacy?: 1 - Never What is the last grade level you completed in school?: some college  Interpreter Needed?: No  Information entered by :: CJohnson, LPN  Past Medical History:  Diagnosis Date  . Cancer involving bladder by direct extension from prostate ([facility]) 06/28/2019   Cystoscopy [name]) - 2 bladder tumors, 1 on anterior prostate planned TURBT/TURP 06/2019 Biopsy - Gleason 5+4=9 prostate cancer [facility] started 07/2019 planned metastatic survey  . History of chicken pox   . Lipoma of neck   . Seasonal allergies   . Syncopal episodes 2007   x 2 s/p unrevealing hospitalization without recurrence   Past Surgical History:  Procedure Laterality Date  . CYSTOSCOPY W/ RETROGRADES Bilateral 07/06/2019   Procedure: CYSTOSCOPY WITH RETROGRADE PYELOGRAM, BILATERAL STENT PLACEMENT;  Surgeon: [name], MD;  Location: [facility];  Service: Urology;  Laterality: Bilateral;  . EP IMPLANTABLE DEVICE N/A 02/25/2016   Procedure: Loop Recorder Removal;  Surgeon: Deboraha Sprang, MD;  Location: [facility];  Service: Cardiovascular;  Laterality: N/A;  . Internal Cardiac Monitor  2007   due to syncopal episodes - never contacted for f/u  . IR NEPHROSTOMY PLACEMENT LEFT  02/14/2020  . IR NEPHROSTOMY PLACEMENT RIGHT  02/14/2020  . LIPOMA EXCISION N/A 02/09/2016   Procedure: EXCISION NECK  LIPOMA;  Surgeon: Autumn Messing III, MD;  Location: [facility];  Service: General;  Laterality: N/A;  . TRANSURETHRAL RESECTION OF BLADDER TUMOR N/A 07/06/2019   Procedure: TRANSURETHRAL RESECTION OF BLADDER TUMOR (TURBT);  Surgeon: [name], MD;  Location: [facility];  Service: Urology;   Laterality: N/A;   Family History  Problem Relation Age of Onset  . Hyperlipidemia Mother   . Cancer Mother        peritoneal cancer  . Hemochromatosis Father        s/p liver transplant - pt screened negative 07/2015  . Hemochromatosis Paternal Grandmother   . Hemochromatosis Paternal Uncle   . Stroke Neg Hx   . Diabetes Neg Hx   . CAD Neg Hx    Social History   Socioeconomic History  . Marital status: Married    Spouse name: Not on file  . Number of children: Not on file  . Years of education: Not on file  . Highest education level: Not on file  Occupational History  . Not on file  Tobacco Use  . Smoking status: Never Smoker  . Smokeless tobacco: Never Used  Substance and Sexual Activity  . Alcohol use: Yes    Alcohol/week: 0.0 standard drinks    Comment: 1 beer most evenings  . Drug use: No  . Sexual activity: Not on file  Other Topics Concern  . Not on file  Social History Narrative   Lives with wife   Grown children   Occ: retired Engineer, structural, Musician   Edu: Bachelor of Art   Activity: stays active outdoors working   Diet: some water, fruits/vegetables daily   Social Determinants of Health   Financial Resource Strain: Low Risk   . Difficulty of Paying Living Expenses: Not hard at all  Food Insecurity: No Food Insecurity  . Worried About Charity fundraiser in the Last Year: Never true  . Ran Out of Food in the Last Year: Never true  Transportation Needs: No Transportation Needs  . Lack of Transportation (Medical): No  . Lack of Transportation (Non-Medical): No  Physical Activity: Inactive  . Days of Exercise per Week: 0 days  . Minutes of Exercise per Session: 0 min  Stress: No Stress Concern Present  . Feeling of Stress : Not at all  Social Connections:   . Frequency of Communication with Friends and Family:   . Frequency of Social Gatherings with Friends and Family:   . Attends Religious Services:   . Active Member of Clubs or  Organizations:   . Attends Archivist Meetings:   Marland Kitchen Marital Status:     Outpatient Encounter Medications as of 04/24/2020  Medication Sig  . abiraterone acetate (ZYTIGA) 250 MG tablet Take 4 tablets (1,000 mg total) by mouth daily. Take on an empty stomach 1 hour before or 2 hours after a meal  . meloxicam (MOBIC) 15 MG tablet Take 1 tablet (15 mg total) by mouth daily as needed for pain.  Marland Kitchen MYRBETRIQ 50 MG TB24 tablet Take 50 mg by mouth daily.  . predniSONE (DELTASONE) 5 MG tablet TAKE ONE TABLET BY MOUTH DAILY WITH BREAKFAST  . tamsulosin (FLOMAX) 0.4 MG CAPS capsule Take 0.4 mg by mouth daily.  Marland Kitchen venlafaxine (EFFEXOR) 25 MG tablet Take 25 mg by mouth daily.   No facility-administered encounter medications on file as of 04/24/2020.    Activities of Daily Living In your present state of health, do you have  any difficulty performing the following activities: 04/24/2020 02/13/2020  Hearing? N N  Vision? N N  Difficulty concentrating or making decisions? N N  Walking or climbing stairs? N N  Dressing or bathing? N N  Doing errands, shopping? N N  Preparing Food and eating ? N -  Using the Toilet? N -  In the past six months, have you accidently leaked urine? N -  Do you have problems with loss of bowel control? N -  Managing your Medications? N -  Managing your Finances? N -  Housekeeping or managing your Housekeeping? N -  Some recent data might be hidden    Patient Care Team: [name], MD as PCP - General (Family Medicine) Cira Rue, RN Nurse Navigator as Registered Nurse (Medical Oncology)   Assessment:   This is a routine wellness examination for Vanny.  Exercise Activities and Dietary recommendations Current Exercise Habits: The patient does not participate in regular exercise at present, Exercise limited by: None identified  Goals    . Patient Stated     04/24/2020, I will maintain and continue medications as prescribed.        Fall Risk Fall  Risk  04/24/2020 04/08/2020 01/31/2020 01/01/2020 08/18/2018  Falls in the past year? 0 0 0 0 No  Number falls in past yr: 0 - - 0 -  Injury with Fall? 0 - - - -  Risk for fall due to : Medication side effect - - - -  Follow up Falls evaluation completed;Falls prevention discussed Falls evaluation completed Falls evaluation completed Falls evaluation completed -   Is the patient's home free of loose throw rugs in walkways, pet beds, electrical cords, etc?   yes      Grab bars in the bathroom? no      Handrails on the stairs?   yes      Adequate lighting?   yes  Timed Get Up and Go Performed: N/A  Depression Screen PHQ 2/9 Scores 04/24/2020 04/08/2020 01/01/2020 08/18/2018  PHQ - 2 Score 0 0 0 0  PHQ- 9 Score 0 - - -    Cognitive Function MMSE - Mini Mental State Exam 04/24/2020  Orientation to time 5  Orientation to Place 5  Registration 3  Attention/ Calculation 5  Recall 3  Language- repeat 1       Mini Cog  Mini-Cog screen was completed. Maximum score is 22. A value of 0 denotes this part of the MMSE was not completed or the patient failed this part of the Mini-Cog screening.  Immunization History  Administered Date(s) Administered  . Influenza-Unspecified 10/09/2019  . PFIZER SARS-COV-2 Vaccination 12/28/2019, 01/17/2020  . Pneumococcal Conjugate-13 08/03/2016  . Pneumococcal Polysaccharide-23 08/05/2017  . Tdap 02/02/2017  . Zoster Recombinat (Shingrix) 10/09/2019, 03/25/2020    Qualifies for Shingles Vaccine: Completed series  Screening Tests Health Maintenance  Topic Date Due  . DTAP VACCINES (1) 10/02/1951  . Fecal DNA (Cologuard)  08/19/2019  . INFLUENZA VACCINE  07/06/2020  . DTaP/Tdap/Td (2 - Td) 02/02/2027  . TETANUS/TDAP  02/02/2027  . COVID-19 Vaccine  Completed  . Hepatitis C Screening  Completed  . PNA vac Low Risk Adult  Completed   Cancer Screenings: Lung: Low Dose CT Chest recommended if Age 59-80 years, 30 pack-year currently smoking OR have quit  w/in 15 years. Patient does not qualify. Colorectal: Cologuard due  Additional Screenings:  Hepatitis C Screening: 07/30/2016      Plan:   Patient will maintain and  continue medications as prescribed.   I have personally reviewed and noted the following in the patient's chart:   . Medical and social history . Use of alcohol, tobacco or illicit drugs  . Current medications and supplements . Functional ability and status . Nutritional status . Physical activity . Advanced directives . List of other physicians . Hospitalizations, surgeries, and ER visits in previous 12 months . Vitals . Screenings to include cognitive, depression, and falls . Referrals and appointments  In addition, I have reviewed and discussed with patient certain preventive protocols, quality metrics, and best practice recommendations. A written personalized care plan for preventive services as well as general preventive health recommendations were provided to patient.     Andrez Grime, LPN  [phone]

## 2020-04-24 NOTE — Patient Instructions (Signed)
Shane [name] , Thank you for taking time to come for your Medicare Wellness Visit. I appreciate your ongoing commitment to your health goals. Please review the following plan we discussed and let me know if I can assist you in the future.   Screening recommendations/referrals: Colonoscopy: Cologuard due Recommended yearly ophthalmology/optometry visit for glaucoma screening and checkup Recommended yearly dental visit for hygiene and checkup  Vaccinations: Influenza vaccine: Up to date, completed 10/09/2019 Pneumococcal vaccine: Completed series Tdap vaccine: Up to date, completed 02/02/2017 Shingles vaccine: Completed series    Advanced directives: Advance directive discussed with you today. Even though you declined this today please call our office should you change your mind and we can give you the proper paperwork for you to fill out.  Conditions/risks identified: hyperlipidemia  Next appointment: 05/01/2020 @ 9 am   Preventive Care 65 Years and Older, Male Preventive care refers to lifestyle choices and visits with your health care provider that can promote health and wellness. What does preventive care include?  A yearly physical exam. This is also called an annual well check.  Dental exams once or twice a year.  Routine eye exams. Ask your health care provider how often you should have your eyes checked.  Personal lifestyle choices, including:  Daily care of your teeth and gums.  Regular physical activity.  Eating a healthy diet.  Avoiding tobacco and drug use.  Limiting alcohol use.  Practicing safe sex.  Taking low doses of aspirin every day.  Taking vitamin and mineral supplements as recommended by your health care provider. What happens during an annual well check? The services and screenings done by your health care provider during your annual well check will depend on your age, overall health, lifestyle risk factors, and family history of disease. Counseling    Your health care provider may ask you questions about your:  Alcohol use.  Tobacco use.  Drug use.  Emotional well-being.  Home and relationship well-being.  Sexual activity.  Eating habits.  History of falls.  Memory and ability to understand (cognition).  Work and work Statistician. Screening  You may have the following tests or measurements:  Height, weight, and BMI.  Blood pressure.  Lipid and cholesterol levels. These may be checked every 5 years, or more frequently if you are over 69 years old.  Skin check.  Lung cancer screening. You may have this screening every year starting at age 68 if you have a 30-pack-year history of smoking and currently smoke or have quit within the past 15 years.  Fecal occult blood test (FOBT) of the stool. You may have this test every year starting at age 85.  Flexible sigmoidoscopy or colonoscopy. You may have a sigmoidoscopy every 5 years or a colonoscopy every 10 years starting at age 66.  Prostate cancer screening. Recommendations will vary depending on your family history and other risks.  Hepatitis C blood test.  Hepatitis B blood test.  Sexually transmitted disease (STD) testing.  Diabetes screening. This is done by checking your blood sugar (glucose) after you have not eaten for a while (fasting). You may have this done every 1-3 years.  Abdominal aortic aneurysm (AAA) screening. You may need this if you are a current or former smoker.  Osteoporosis. You may be screened starting at age 56 if you are at high risk. Talk with your health care provider about your test results, treatment options, and if necessary, the need for more tests. Vaccines  Your health care provider may  recommend certain vaccines, such as:  Influenza vaccine. This is recommended every year.  Tetanus, diphtheria, and acellular pertussis (Tdap, Td) vaccine. You may need a Td booster every 10 years.  Zoster vaccine. You may need this after age  6.  Pneumococcal 13-valent conjugate (PCV13) vaccine. One dose is recommended after age 52.  Pneumococcal polysaccharide (PPSV23) vaccine. One dose is recommended after age 79. Talk to your health care provider about which screenings and vaccines you need and how often you need them. This information is not intended to replace advice given to you by your health care provider. Make sure you discuss any questions you have with your health care provider. Document Released: 12/19/2015 Document Revised: 08/11/2016 Document Reviewed: 09/23/2015 Elsevier Interactive Patient Education  2017 [place] Prevention in the Home Falls can cause injuries. They can happen to people of all ages. There are many things you can do to make your home safe and to help prevent falls. What can I do on the outside of my home?  Regularly fix the edges of walkways and driveways and fix any cracks.  Remove anything that might make you trip as you walk through a door, such as a raised step or threshold.  Trim any bushes or trees on the path to your home.  Use bright outdoor lighting.  Clear any walking paths of anything that might make someone trip, such as rocks or tools.  Regularly check to see if handrails are loose or broken. Make sure that both sides of any steps have handrails.  Any raised decks and porches should have guardrails on the edges.  Have any leaves, snow, or ice cleared regularly.  Use sand or salt on walking paths during winter.  Clean up any spills in your garage right away. This includes oil or grease spills. What can I do in the bathroom?  Use night lights.  Install grab bars by the toilet and in the tub and shower. Do not use towel bars as grab bars.  Use non-skid mats or decals in the tub or shower.  If you need to sit down in the shower, use a plastic, non-slip stool.  Keep the floor dry. Clean up any water that spills on the floor as soon as it happens.  Remove  soap buildup in the tub or shower regularly.  Attach bath mats securely with double-sided non-slip rug tape.  Do not have throw rugs and other things on the floor that can make you trip. What can I do in the bedroom?  Use night lights.  Make sure that you have a light by your bed that is easy to reach.  Do not use any sheets or blankets that are too big for your bed. They should not hang down onto the floor.  Have a firm chair that has side arms. You can use this for support while you get dressed.  Do not have throw rugs and other things on the floor that can make you trip. What can I do in the kitchen?  Clean up any spills right away.  Avoid walking on wet floors.  Keep items that you use a lot in easy-to-reach places.  If you need to reach something above you, use a strong step stool that has a grab bar.  Keep electrical cords out of the way.  Do not use floor polish or wax that makes floors slippery. If you must use wax, use non-skid floor wax.  Do not have throw rugs and  other things on the floor that can make you trip. What can I do with my stairs?  Do not leave any items on the stairs.  Make sure that there are handrails on both sides of the stairs and use them. Fix handrails that are broken or loose. Make sure that handrails are as long as the stairways.  Check any carpeting to make sure that it is firmly attached to the stairs. Fix any carpet that is loose or worn.  Avoid having throw rugs at the top or bottom of the stairs. If you do have throw rugs, attach them to the floor with carpet tape.  Make sure that you have a light switch at the top of the stairs and the bottom of the stairs. If you do not have them, ask someone to add them for you. What else can I do to help prevent falls?  Wear shoes that:  Do not have high heels.  Have rubber bottoms.  Are comfortable and fit you well.  Are closed at the toe. Do not wear sandals.  If you use a  stepladder:  Make sure that it is fully opened. Do not climb a closed stepladder.  Make sure that both sides of the stepladder are locked into place.  Ask someone to hold it for you, if possible.  Clearly mark and make sure that you can see:  Any grab bars or handrails.  First and last steps.  Where the edge of each step is.  Use tools that help you move around (mobility aids) if they are needed. These include:  Canes.  Walkers.  Scooters.  Crutches.  Turn on the lights when you go into a dark area. Replace any light bulbs as soon as they burn out.  Set up your furniture so you have a clear path. Avoid moving your furniture around.  If any of your floors are uneven, fix them.  If there are any pets around you, be aware of where they are.  Review your medicines with your doctor. Some medicines can make you feel dizzy. This can increase your chance of falling. Ask your doctor what other things that you can do to help prevent falls. This information is not intended to replace advice given to you by your health care provider. Make sure you discuss any questions you have with your health care provider. Document Released: 09/18/2009 Document Revised: 04/29/2016 Document Reviewed: 12/27/2014 Elsevier Interactive Patient Education  2017 [employer].

## 2020-05-01 ENCOUNTER — Encounter: Payer: Self-pay | Admitting: Family Medicine

## 2020-05-01 ENCOUNTER — Ambulatory Visit (INDEPENDENT_AMBULATORY_CARE_PROVIDER_SITE_OTHER): Payer: PPO | Admitting: Family Medicine

## 2020-05-01 ENCOUNTER — Other Ambulatory Visit: Payer: Self-pay

## 2020-05-01 VITALS — BP 122/74 | HR 65 | Temp 97.8°F | Ht 71.0 in | Wt 183.6 lb

## 2020-05-01 DIAGNOSIS — Z0000 Encounter for general adult medical examination without abnormal findings: Secondary | ICD-10-CM | POA: Diagnosis not present

## 2020-05-01 DIAGNOSIS — C7951 Secondary malignant neoplasm of bone: Secondary | ICD-10-CM

## 2020-05-01 DIAGNOSIS — E7800 Pure hypercholesterolemia, unspecified: Secondary | ICD-10-CM | POA: Diagnosis not present

## 2020-05-01 DIAGNOSIS — R432 Parageusia: Secondary | ICD-10-CM

## 2020-05-01 DIAGNOSIS — C7911 Secondary malignant neoplasm of bladder: Secondary | ICD-10-CM

## 2020-05-01 DIAGNOSIS — C61 Malignant neoplasm of prostate: Secondary | ICD-10-CM | POA: Diagnosis not present

## 2020-05-01 DIAGNOSIS — Z7189 Other specified counseling: Secondary | ICD-10-CM | POA: Diagnosis not present

## 2020-05-01 DIAGNOSIS — Z86718 Personal history of other venous thrombosis and embolism: Secondary | ICD-10-CM | POA: Diagnosis not present

## 2020-05-01 DIAGNOSIS — N390 Urinary tract infection, site not specified: Secondary | ICD-10-CM

## 2020-05-01 NOTE — Patient Instructions (Addendum)
We will sign you up for cologuard. Advanced directive packet provided.  You are doing well today. Continue current medicines.  Return as needed or in 1 year for next physical or wellness visit.   Health Maintenance After Age 70 After age 38, you are at a higher risk for certain long-term diseases and infections as well as injuries from falls. Falls are a major cause of broken bones and head injuries in people who are older than age 59. Getting regular preventive care can help to keep you healthy and well. Preventive care includes getting regular testing and making lifestyle changes as recommended by your health care provider. Talk with your health care provider about:  Which screenings and tests you should have. A screening is a test that checks for a disease when you have no symptoms.  A diet and exercise plan that is right for you. What should I know about screenings and tests to prevent falls? Screening and testing are the best ways to find a health problem early. Early diagnosis and treatment give you the best chance of managing medical conditions that are common after age 60. Certain conditions and lifestyle choices may make you more likely to have a fall. Your health care provider may recommend:  Regular vision checks. Poor vision and conditions such as cataracts can make you more likely to have a fall. If you wear glasses, make sure to get your prescription updated if your vision changes.  Medicine review. Work with your health care provider to regularly review all of the medicines you are taking, including over-the-counter medicines. Ask your health care provider about any side effects that may make you more likely to have a fall. Tell your health care provider if any medicines that you take make you feel dizzy or sleepy.  Osteoporosis screening. Osteoporosis is a condition that causes the bones to get weaker. This can make the bones weak and cause them to break more easily.  Blood  pressure screening. Blood pressure changes and medicines to control blood pressure can make you feel dizzy.  Strength and balance checks. Your health care provider may recommend certain tests to check your strength and balance while standing, walking, or changing positions.  Foot health exam. Foot pain and numbness, as well as not wearing proper footwear, can make you more likely to have a fall.  Depression screening. You may be more likely to have a fall if you have a fear of falling, feel emotionally low, or feel unable to do activities that you used to do.  Alcohol use screening. Using too much alcohol can affect your balance and may make you more likely to have a fall. What actions can I take to lower my risk of falls? General instructions  Talk with your health care provider about your risks for falling. Tell your health care provider if: ? You fall. Be sure to tell your health care provider about all falls, even ones that seem minor. ? You feel dizzy, sleepy, or off-balance.  Take over-the-counter and prescription medicines only as told by your health care provider. These include any supplements.  Eat a healthy diet and maintain a healthy weight. A healthy diet includes low-fat dairy products, low-fat (lean) meats, and fiber from whole grains, beans, and lots of fruits and vegetables. Home safety  Remove any tripping hazards, such as rugs, cords, and clutter.  Install safety equipment such as grab bars in bathrooms and safety rails on stairs.  Keep rooms and walkways well-lit. Activity  Follow a regular exercise program to stay fit. This will help you maintain your balance. Ask your health care provider what types of exercise are appropriate for you.  If you need a cane or walker, use it as recommended by your health care provider.  Wear supportive shoes that have nonskid soles. Lifestyle  Do not drink alcohol if your health care provider tells you not to drink.  If you  drink alcohol, limit how much you have: ? 0-1 drink a day for women. ? 0-2 drinks a day for men.  Be aware of how much alcohol is in your drink. In the [place] one drink equals one typical bottle of beer (12 oz), one-half glass of wine (5 oz), or one shot of hard liquor (1 oz).  Do not use any products that contain nicotine or tobacco, such as cigarettes and e-cigarettes. If you need help quitting, ask your health care provider. Summary  Having a healthy lifestyle and getting preventive care can help to protect your health and wellness after age 75.  Screening and testing are the best way to find a health problem early and help you avoid having a fall. Early diagnosis and treatment give you the best chance for managing medical conditions that are more common for people who are older than age 64.  Falls are a major cause of broken bones and head injuries in people who are older than age 32. Take precautions to prevent a fall at home.  Work with your health care provider to learn what changes you can make to improve your health and wellness and to prevent falls. This information is not intended to replace advice given to you by your health care provider. Make sure you discuss any questions you have with your health care provider. Document Revised: 03/15/2019 Document Reviewed: 10/05/2017 Elsevier Patient Education  2020 [employer].

## 2020-05-01 NOTE — Progress Notes (Signed)
This visit was conducted in person.  BP 122/74 (BP Location: Left Arm, Patient Position: Sitting, Cuff Size: Normal)   Pulse 65   Temp 97.8 F (36.6 C) (Temporal)   Ht 5\' 11"  (1.803 m)   Wt 183 lb 9 oz (83.3 kg)   SpO2 98%   BMI 25.60 kg/m    CC: CPE Subjective:    Patient ID: Shane [name], male    DOB: 01-06-1951, 69 y.o.   MRN: [id number]  HPI: Shane [name] is a 69 y.o. male presenting on 05/01/2020 for Annual Exam (Prt 2. 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 Has seen urology [name]) onc Alen Blew) and ID Megan Salon). Cleared by ID this month! Continues zytiga and lupron injection Q6 mo, seems to be tolerating well except for hot flashes. Planned androgen deprivation indefinitely. Had 3 separate PICC lines.   Saw health advisor last week for medicare wellness visit. Note reviewed.   No exam data present    Clinical Support from 04/24/2020 in [place] at [facility] Total Score  0      Fall Risk  04/24/2020 04/08/2020 01/31/2020 01/01/2020 08/18/2018  Falls in the past year? 0 0 0 0 No  Number falls in past yr: 0 - - 0 -  Injury with Fall? 0 - - - -  Risk for fall due to : Medication side effect - - - -  Follow up Falls evaluation completed;Falls prevention discussed Falls evaluation completed Falls evaluation completed Falls evaluation completed -    Preventative: Colonoscopy remotely normal. Cologuard normal 2017. Interested in repeat - will sign up for this.  Prostate cancer - see above. Lung cancer screening - not eligible Flu shot - declines  Prevnar2017, pneumovax 07/2017 Tdap 01/2017 COVID vaccine - completed pfizer vaccine 01/2020 shingrix - completed 03/2020 Advanced planning - does not have this set up.  Planning on setting up, to see lawyer. Wife would be HCPOA. Packet provided today  Seat belt use discussed  Sunscreen use discussed, no changing moles on skin. Not a smoker.  Alcohol - 1-2 beer/day Dentist yearly Eye exam yearly Bowel - no constipation Bladder - nocturia x2, no incontinence  Lives with wife Grown children Occ: retired Engineer, structural, Musician Edu: [employer] Activity: stays active working outdoors  Diet: some water, fruits/vegetables daily     Relevant past medical, surgical, family and social history reviewed and updated as indicated. Interim medical history since our last visit reviewed. Allergies and medications reviewed and updated. Outpatient Medications Prior to Visit  Medication Sig Dispense Refill  . abiraterone acetate (ZYTIGA) 250 MG tablet Take 4 tablets (1,000 mg total) by mouth daily. Take on an empty stomach 1 hour before or 2 hours after a meal 120 tablet 10  . meloxicam (MOBIC) 15 MG tablet Take 1 tablet (15 mg total) by mouth daily as needed for pain.    Marland Kitchen MYRBETRIQ 50 MG TB24 tablet Take 50 mg by mouth daily.    . predniSONE (DELTASONE) 5 MG tablet TAKE ONE TABLET BY MOUTH DAILY WITH BREAKFAST 90 tablet 0  . tamsulosin (FLOMAX) 0.4 MG CAPS capsule Take 0.4 mg by mouth daily.    Marland Kitchen venlafaxine (EFFEXOR) 25 MG tablet Take 25 mg by mouth daily.  No facility-administered medications prior to visit.     Per HPI unless specifically indicated in ROS section below Review of Systems  Constitutional: Negative for activity change, appetite change, chills, fatigue, fever and unexpected weight change.  HENT: Negative for hearing loss.   Eyes: Negative for visual disturbance.  Respiratory: Negative for cough, chest tightness, shortness of breath and wheezing.   Cardiovascular: Negative for chest pain, palpitations and leg swelling.  Gastrointestinal: Negative for abdominal distention, abdominal pain, blood in stool, constipation,  diarrhea, nausea and vomiting.  Genitourinary: Negative for difficulty urinating and hematuria.  Musculoskeletal: Negative for arthralgias, myalgias and neck pain.  Skin: Negative for rash.  Neurological: Negative for dizziness, seizures, syncope and headaches.  Hematological: Negative for adenopathy. Bruises/bleeds easily.  Psychiatric/Behavioral: Negative for dysphoric mood. The patient is not nervous/anxious.    Objective:  BP 122/74 (BP Location: Left Arm, Patient Position: Sitting, Cuff Size: Normal)   Pulse 65   Temp 97.8 F (36.6 C) (Temporal)   Ht 5\' 11"  (1.803 m)   Wt 183 lb 9 oz (83.3 kg)   SpO2 98%   BMI 25.60 kg/m   Wt Readings from Last 3 Encounters:  05/01/20 183 lb 9 oz (83.3 kg)  04/08/20 180 lb (81.6 kg)  02/20/20 181 lb (82.1 kg)      Physical Exam Vitals and nursing note reviewed.  Constitutional:      General: He is not in acute distress.    Appearance: Normal appearance. He is well-developed. He is not ill-appearing.  HENT:     Head: Normocephalic and atraumatic.     Right Ear: Hearing, tympanic membrane, ear canal and external ear normal.     Left Ear: Hearing, tympanic membrane, ear canal and external ear normal.     Mouth/Throat:     Pharynx: Uvula midline.  Eyes:     General: No scleral icterus.    Conjunctiva/sclera: Conjunctivae normal.     Pupils: Pupils are equal, round, and reactive to light.  Cardiovascular:     Rate and Rhythm: Normal rate and regular rhythm.     Pulses: Normal pulses.          Radial pulses are 2+ on the right side and 2+ on the left side.     Heart sounds: Normal heart sounds. No murmur.  Pulmonary:     Effort: Pulmonary effort is normal. No respiratory distress.     Breath sounds: Normal breath sounds. No wheezing, rhonchi or rales.  Abdominal:     General: Abdomen is flat. Bowel sounds are normal. There is no distension.     Palpations: Abdomen is soft. There is no mass.     Tenderness: There is no abdominal  tenderness. There is no guarding or rebound.     Hernia: No hernia is present.  Musculoskeletal:        General: Normal range of motion.     Cervical back: Normal range of motion and neck supple.     Right lower leg: No edema.     Left lower leg: No edema.  Lymphadenopathy:     Cervical: No cervical adenopathy.  Skin:    General: Skin is warm and dry.     Findings: No rash.  Neurological:     General: No focal deficit present.     Mental Status: He is alert and oriented to person, place, and time.     Comments: CN grossly intact, station and gait intact  Psychiatric:  Mood and Affect: Mood normal.        Behavior: Behavior normal.        Thought Content: Thought content normal.        Judgment: Judgment normal.       Results for orders placed or performed in visit on 04/24/20  Comprehensive metabolic panel  Result Value Ref Range   Sodium 139 135 - 145 mEq/L   Potassium 4.4 3.5 - 5.1 mEq/L   Chloride 105 96 - 112 mEq/L   CO2 28 19 - 32 mEq/L   Glucose, Bld 95 70 - 99 mg/dL   BUN 23 6 - 23 mg/dL   Creatinine, Ser 1.23 0.40 - 1.50 mg/dL   Total Bilirubin 0.5 0.2 - 1.2 mg/dL   Alkaline Phosphatase 45 39 - 117 U/L   AST 17 0 - 37 U/L   ALT 12 0 - 53 U/L   Total Protein 6.6 6.0 - 8.3 g/dL   Albumin 4.2 3.5 - 5.2 g/dL   GFR 58.22 (L) >60.00 mL/min   Calcium 9.7 8.4 - 10.5 mg/dL  CBC with Differential/Platelet  Result Value Ref Range   WBC 4.8 4.0 - 10.5 K/uL   RBC 3.80 (L) 4.22 - 5.81 Mil/uL   Hemoglobin 11.6 (L) 13.0 - 17.0 g/dL   HCT 35.1 (L) 39.0 - 52.0 %   MCV 92.1 78.0 - 100.0 fl   MCHC 32.9 30.0 - 36.0 g/dL   RDW 14.8 11.5 - 15.5 %   Platelets 219.0 150.0 - 400.0 K/uL   Neutrophils Relative % 54.0 43.0 - 77.0 %   Lymphocytes Relative 28.0 12.0 - 46.0 %   Monocytes Relative 10.4 3.0 - 12.0 %   Eosinophils Relative 6.9 (H) 0.0 - 5.0 %   Basophils Relative 0.7 0.0 - 3.0 %   Neutro Abs 2.6 1.4 - 7.7 K/uL   Lymphs Abs 1.3 0.7 - 4.0 K/uL   Monocytes Absolute  0.5 0.1 - 1.0 K/uL   Eosinophils Absolute 0.3 0.0 - 0.7 K/uL   Basophils Absolute 0.0 0.0 - 0.1 K/uL  Lipid panel  Result Value Ref Range   Cholesterol 196 0 - 200 mg/dL   Triglycerides 106.0 0.0 - 149.0 mg/dL   HDL 51.90 >39.00 mg/dL   VLDL 21.2 0.0 - 40.0 mg/dL   LDL Cholesterol 122 (H) 0 - 99 mg/dL   Total CHOL/HDL Ratio 4    NonHDL 143.64   PSA, Medicare  Result Value Ref Range   PSA 0.05 (L) 0.10 - 4.00 ng/ml   Assessment & Plan:  This visit occurred during the SARS-CoV-2 public health emergency.  Safety protocols were in place, including screening questions prior to the visit, additional usage of staff PPE, and extensive cleaning of exam room while observing appropriate contact time as indicated for disinfecting solutions.   Problem List Items Addressed This Visit    Recurrent UTI    H/o recalcitrant pseudomonas UTI finally cured earlier this year s/p prolonged IV abx (cefepime). Has been released from ID clinic      Malignant neoplasm of prostate ([place])    Appreciate uro and onc care. Very stable period despite aggressive prostate cancer. ADT sensitive cancer. Continue indefinite zytiga and lupron Q6 mo(pt unsure dosage).       Loss of taste    This has improved off antibiotics.       HLD (hyperlipidemia)    Chronic, minimal off statin. Will remain off med. Encouraged diet choices to maintain good lipid control.  The  10-year ASCVD risk score Mikey Bussing DC Brooke Bonito., et al., 2013) is: 15.2%   Values used to calculate the score:     Age: 1 years     Sex: Male     Is Non-Hispanic African American: No     Diabetic: No     Tobacco smoker: No     Systolic Blood Pressure: 123XX123 mmHg     Is BP treated: No     HDL Cholesterol: 51.9 mg/dL     Total Cholesterol: 196 mg/dL       History of DVT of lower extremity   Health maintenance examination - Primary    Preventative protocols reviewed and updated unless pt declined. Discussed healthy diet and lifestyle.       Cancer involving  bladder by direct extension from prostate [facility])    Appreciate uro and onc care. Very stable period despite aggressive prostate cancer.       Bone metastases ([place])   Advanced care planning/counseling discussion    Advanced planning - does not have this set up. Planning on setting up, to see lawyer. Wife would be HCPOA. Packet provided today           No orders of the defined types were placed in this encounter.  No orders of the defined types were placed in this encounter.   Patient instructions: We will sign you up for cologuard. Advanced directive packet provided.  You are doing well today. Continue current medicines.  Return as needed or in 1 year for next physical or wellness visit.   Follow up plan: Return in about 1 year (around 05/01/2021) for annual exam, prior fasting for blood work, medicare wellness visit.  [name], MD

## 2020-05-03 NOTE — Assessment & Plan Note (Addendum)
Preventative protocols reviewed and updated unless pt declined. Discussed healthy diet and lifestyle.  

## 2020-05-03 NOTE — Assessment & Plan Note (Signed)
This has improved off antibiotics.

## 2020-05-03 NOTE — Assessment & Plan Note (Signed)
Advanced planning - does not have this set up. Planning on setting up, to see lawyer. Wife would be HCPOA. Packet provided today

## 2020-05-03 NOTE — Assessment & Plan Note (Addendum)
H/o recalcitrant pseudomonas UTI finally cured earlier this year s/p prolonged IV abx (cefepime). Has been released from ID clinic

## 2020-05-03 NOTE — Assessment & Plan Note (Addendum)
Chronic, minimal off statin. Will remain off med. Encouraged diet choices to maintain good lipid control.  The 10-year ASCVD risk score Mikey Bussing DC Brooke Bonito., et al., 2013) is: 15.2%   Values used to calculate the score:     Age: 69 years     Sex: Male     Is Non-Hispanic African American: No     Diabetic: No     Tobacco smoker: No     Systolic Blood Pressure: 123XX123 mmHg     Is BP treated: No     HDL Cholesterol: 51.9 mg/dL     Total Cholesterol: 196 mg/dL

## 2020-05-03 NOTE — Assessment & Plan Note (Addendum)
Appreciate uro and onc care. Very stable period despite aggressive prostate cancer. ADT sensitive cancer. Continue indefinite zytiga and lupron Q6 mo(pt unsure dosage).

## 2020-05-03 NOTE — Assessment & Plan Note (Signed)
Appreciate uro and onc care. Very stable period despite aggressive prostate cancer.

## 2020-05-08 DIAGNOSIS — Z1211 Encounter for screening for malignant neoplasm of colon: Secondary | ICD-10-CM | POA: Diagnosis not present

## 2020-05-08 DIAGNOSIS — Z1212 Encounter for screening for malignant neoplasm of rectum: Secondary | ICD-10-CM | POA: Diagnosis not present

## 2020-05-08 LAB — COLOGUARD: Cologuard: POSITIVE — AB

## 2020-05-12 LAB — COLOGUARD: COLOGUARD: POSITIVE — AB

## 2020-05-12 LAB — EXTERNAL GENERIC LAB PROCEDURE: COLOGUARD: POSITIVE — AB

## 2020-05-14 ENCOUNTER — Telehealth: Payer: Self-pay

## 2020-05-14 DIAGNOSIS — Z1211 Encounter for screening for malignant neoplasm of colon: Secondary | ICD-10-CM

## 2020-05-14 NOTE — Telephone Encounter (Signed)
Shane [name] called to follow-up on positive Cologuard results. Stated they were faxed 05-12-20. She is asking for a call back if they were not received. [phone]

## 2020-05-14 NOTE — Telephone Encounter (Signed)
Spoke with Shane [name] at [employer] notifying here we have not received Cologuard results.   Confirmed our correct fax #.  Says she will fax results today.

## 2020-05-15 ENCOUNTER — Other Ambulatory Visit: Payer: Self-pay | Admitting: Oncology

## 2020-05-21 ENCOUNTER — Inpatient Hospital Stay: Payer: PPO | Attending: Oncology | Admitting: Oncology

## 2020-05-21 ENCOUNTER — Other Ambulatory Visit: Payer: Self-pay

## 2020-05-21 ENCOUNTER — Inpatient Hospital Stay: Payer: PPO

## 2020-05-21 VITALS — BP 151/78 | HR 62 | Temp 97.5°F | Resp 17 | Ht 71.0 in | Wt 187.3 lb

## 2020-05-21 DIAGNOSIS — C7951 Secondary malignant neoplasm of bone: Secondary | ICD-10-CM | POA: Insufficient documentation

## 2020-05-21 DIAGNOSIS — C61 Malignant neoplasm of prostate: Secondary | ICD-10-CM | POA: Diagnosis not present

## 2020-05-21 DIAGNOSIS — C7911 Secondary malignant neoplasm of bladder: Secondary | ICD-10-CM | POA: Diagnosis not present

## 2020-05-21 DIAGNOSIS — Z79899 Other long term (current) drug therapy: Secondary | ICD-10-CM | POA: Insufficient documentation

## 2020-05-21 DIAGNOSIS — Z7952 Long term (current) use of systemic steroids: Secondary | ICD-10-CM | POA: Diagnosis not present

## 2020-05-21 DIAGNOSIS — Z86711 Personal history of pulmonary embolism: Secondary | ICD-10-CM | POA: Diagnosis not present

## 2020-05-21 DIAGNOSIS — Z791 Long term (current) use of non-steroidal anti-inflammatories (NSAID): Secondary | ICD-10-CM | POA: Diagnosis not present

## 2020-05-21 LAB — CBC WITH DIFFERENTIAL (CANCER CENTER ONLY)
Abs Immature Granulocytes: 0.05 10*3/uL (ref 0.00–0.07)
Basophils Absolute: 0 10*3/uL (ref 0.0–0.1)
Basophils Relative: 1 %
Eosinophils Absolute: 0.4 10*3/uL (ref 0.0–0.5)
Eosinophils Relative: 8 %
HCT: 37.3 % — ABNORMAL LOW (ref 39.0–52.0)
Hemoglobin: 12.2 g/dL — ABNORMAL LOW (ref 13.0–17.0)
Immature Granulocytes: 1 %
Lymphocytes Relative: 20 %
Lymphs Abs: 1 10*3/uL (ref 0.7–4.0)
MCH: 30.8 pg (ref 26.0–34.0)
MCHC: 32.7 g/dL (ref 30.0–36.0)
MCV: 94.2 fL (ref 80.0–100.0)
Monocytes Absolute: 0.4 10*3/uL (ref 0.1–1.0)
Monocytes Relative: 8 %
Neutro Abs: 3.1 10*3/uL (ref 1.7–7.7)
Neutrophils Relative %: 62 %
Platelet Count: 196 10*3/uL (ref 150–400)
RBC: 3.96 MIL/uL — ABNORMAL LOW (ref 4.22–5.81)
RDW: 13.2 % (ref 11.5–15.5)
WBC Count: 5 10*3/uL (ref 4.0–10.5)
nRBC: 0 % (ref 0.0–0.2)

## 2020-05-21 LAB — CMP (CANCER CENTER ONLY)
ALT: 15 U/L (ref 0–44)
AST: 23 U/L (ref 15–41)
Albumin: 4 g/dL (ref 3.5–5.0)
Alkaline Phosphatase: 51 U/L (ref 38–126)
Anion gap: 12 (ref 5–15)
BUN: 18 mg/dL (ref 8–23)
CO2: 25 mmol/L (ref 22–32)
Calcium: 10.1 mg/dL (ref 8.9–10.3)
Chloride: 107 mmol/L (ref 98–111)
Creatinine: 1.5 mg/dL — ABNORMAL HIGH (ref 0.61–1.24)
GFR, Est AFR Am: 54 mL/min — ABNORMAL LOW (ref >60)
GFR, Estimated: 47 mL/min — ABNORMAL LOW (ref >60)
Glucose, Bld: 91 mg/dL (ref 70–99)
Potassium: 3.8 mmol/L (ref 3.5–5.1)
Sodium: 144 mmol/L (ref 135–145)
Total Bilirubin: 0.8 mg/dL (ref 0.3–1.2)
Total Protein: 7.4 g/dL (ref 6.5–8.1)

## 2020-05-21 NOTE — Progress Notes (Signed)
Hematology and Oncology Follow Up Visit  Shane [name] [id number] [birth year] 69 y.o. 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 He was found to have Gleason score of 4+4 = 9 with his initial biopsy.   Prior Therapy:  He is status post TURBT and stent placement bilaterally the pathology showed Gleason score 5+4 = 9 prostate cancer invading into the bladder.  Current therapy:   Androgen deprivation under the care of Dr. Louis [name].  Zytiga 1000 mg daily with prednisone 5 mg started in September 2020.  Interim History: Shane [name] returns today for a follow-up visit. Since the last visit, he reports no major changes in his health.  He has tolerated Zytiga without any major complaints.  He denies any recent nausea, vomiting or abdominal pain.  Denies recent hospitalizations or illnesses.  He denies any hematuria or dysuria.  He is urinating freely at this time and all his catheters have been removed.         Medications: Updated on review. Current Outpatient Medications  Medication Sig Dispense Refill  . abiraterone acetate (ZYTIGA) 250 MG tablet Take 4 tablets (1,000 mg total) by mouth daily. Take on an empty stomach 1 hour before or 2 hours after a meal 120 tablet 10  . meloxicam (MOBIC) 15 MG tablet Take 1 tablet (15 mg total) by mouth daily as needed for pain.    Marland Kitchen MYRBETRIQ 50 MG TB24 tablet Take 50 mg by mouth daily.    . predniSONE (DELTASONE) 5 MG tablet TAKE ONE TABLET BY MOUTH DAILY WITH BREAKFAST 90 tablet 0  . tamsulosin (FLOMAX) 0.4 MG CAPS capsule Take 0.4 mg by mouth daily.    Marland Kitchen venlafaxine (EFFEXOR) 25 MG tablet Take 25 mg by mouth daily.     No current facility-administered medications for this visit.     Allergies:  Allergies  Allergen Reactions  . Bactrim [Sulfamethoxazole-Trimethoprim] Hives    Red rash, hives  . Hydrocodone   .  Percocet [Oxycodone-Acetaminophen] Other (See Comments)    TURNS RED FROM CHEST UP, LOOKS LIKE SUNBURN AND STARTS TO [place] BADLY  . Vancomycin   . Penicillins Swelling and Rash    Did it involve swelling of the face/tongue/throat, SOB, or low BP? No Did it involve sudden or severe rash/hives, skin peeling, or any reaction on the inside of your mouth or nose? No Did you need to seek medical attention at a hospital or doctor's office? No When did it last happen?50 years ago If all above answers are "NO", may proceed with cephalosporin use.   Marland Kitchen Zithromax [Azithromycin] Rash        Physical Exam:  Blood pressure (!) 151/78, pulse 62, temperature (!) 97.5 F (36.4 C), temperature source Temporal, resp. rate 17, height 5\' 11"  (1.803 m), weight 187 lb 4.8 oz (85 kg), SpO2 100 %.    ECOG: 0    General appearance: Alert, awake without any distress. Head: Atraumatic without abnormalities Oropharynx: Without any thrush or ulcers. Eyes: No scleral icterus. Lymph nodes: No lymphadenopathy noted in the cervical, supraclavicular, or axillary nodes Heart:regular rate and rhythm, without any murmurs or gallops.   Lung: Clear to auscultation without any rhonchi, wheezes or dullness to percussion. Abdomin: Soft, nontender without any shifting dullness or ascites. Musculoskeletal: No clubbing or cyanosis. Neurological: No motor or sensory deficits. Skin: No rashes or lesions.        Lab Results: Lab Results  Component Value Date   WBC 5.0 05/21/2020   HGB 12.2 (L) 05/21/2020   HCT 37.3 (L) 05/21/2020   MCV 94.2 05/21/2020   PLT 196 05/21/2020     Chemistry      Component Value Date/Time   NA 139 04/24/2020 0741   NA 141 01/08/2020 0000   K 4.4 04/24/2020 0741   CL 105 04/24/2020 0741   CO2 28 04/24/2020 0741   BUN 23 04/24/2020 0741   CREATININE 1.23 04/24/2020 0741   CREATININE 1.30 (H) 02/19/2020 0913   GLU 113 01/08/2020 0000      Component Value Date/Time    CALCIUM 9.7 04/24/2020 0741   ALKPHOS 45 04/24/2020 0741   AST 17 04/24/2020 0741   AST 33 02/19/2020 0913   ALT 12 04/24/2020 0741   ALT 27 02/19/2020 0913   BILITOT 0.5 04/24/2020 0741   BILITOT 0.5 02/19/2020 0913       Results for Shane [name], Shane [name] (MRN [id number]) as of 05/21/2020 09:15  Ref. Range 02/19/2020 09:13 04/24/2020 07:41  PSA Latest Ref Range: 0.10 - 4.00 ng/ml  0.05 (L)  Prostate Specific Ag, Serum Latest Ref Range: 0.0 - 4.0 ng/mL <0.1       Impression and Plan:   69 year old with:  1.   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   He has tolerated the Zytiga without any major complications with excellent PSA response that is currently undetectable. Risks and benefits of continuing this therapy were reviewed. Potential complications that include nausea, fatigue, hypertension and edema were reiterated. Alternative options would be systemic chemotherapy which will be deferred if he develops castration-resistant disease.  He is agreeable to continue at this time.   2.  Pulmonary embolism: He has completed a course of Xarelto no longer on anticoagulation. He has no evidence of recurrent bleeding or thrombosis.  3.  Bilateral hydronephrosis: Resolved and nephrostomy tube has been removed.  4.  Hematuria: This has resolved at this time.   5.  Androgen deprivation: I recommended continuing this indefinitely.  He has received previously under the care of Dr. Louis [name].  He has expressed interest to receive androgen deprivation in the future at the cancer center.  Long-term complication associated with this treatment were reviewed which include hot flashes, weight gain and sexual dysfunction.  He is agreeable to proceed and we will tentatively start that in September 2021.  6.  Follow-up: Will be in 3 months for a follow-up visit.  30 minutes were spent on this encounter. The time was dedicated to updating his  disease status, discussing treatment options and addressing complications related to his cancer and cancer therapy.  Shane Button, MD 6/16/20219:13 AM

## 2020-05-22 ENCOUNTER — Telehealth: Payer: Self-pay | Admitting: *Deleted

## 2020-05-22 ENCOUNTER — Telehealth: Payer: Self-pay | Admitting: Oncology

## 2020-05-22 DIAGNOSIS — H25813 Combined forms of age-related cataract, bilateral: Secondary | ICD-10-CM | POA: Diagnosis not present

## 2020-05-22 LAB — PROSTATE-SPECIFIC AG, SERUM (LABCORP): Prostate Specific Ag, Serum: <0.1 ng/mL (ref 0.0–4.0)

## 2020-05-22 NOTE — Telephone Encounter (Signed)
Called patient to let him know of his PSA results per Dr. Alen Blew request.  Spoke with wife and she advised she would relay results.

## 2020-05-22 NOTE — Telephone Encounter (Signed)
Scheduled appt per 6/16 los.  Spoke with pt and scheduled appt per pt request.  Pt aware of his appt date and time,

## 2020-05-22 NOTE — Telephone Encounter (Signed)
-----   Message from [name], MD sent at 05/22/2020  8:51 AM EDT ----- Please let him know his PSA is still low

## 2020-05-26 ENCOUNTER — Encounter: Payer: Self-pay | Admitting: Family Medicine

## 2020-05-26 NOTE — Telephone Encounter (Signed)
If that's the case, I would recommend he repeat iFOB once hemorrhoids are not actively inflamed. Please mail him a kit.

## 2020-05-26 NOTE — Telephone Encounter (Addendum)
Please notify patient cologuard returned positive for either blood and/or increased risk on DNA evaluation for colon cancer or colon polyps.  Usual next step is colonoscopy - is he interested in referral to GI to discuss options?  Results placed in Lisa's box.

## 2020-05-26 NOTE — Telephone Encounter (Signed)
Spoke with pt relaying Dr. Synthia Innocent message and notified him a iFOB kit will be mailed.  Pt verbalizes understanding.

## 2020-05-26 NOTE — Addendum Note (Signed)
Addended by: [name] on: 05/26/2020 01:13 PM   Modules accepted: Orders

## 2020-05-26 NOTE — Telephone Encounter (Signed)
IFOB mailed today

## 2020-05-26 NOTE — Telephone Encounter (Signed)
Documented Cologuard results.  Spoke with pt relaying results.  Verbalizes understanding and wants to make Dr. Darnell Level aware he had some hemorrhoids at the time.  However, pt agrees to GI referral.

## 2020-05-30 ENCOUNTER — Other Ambulatory Visit (INDEPENDENT_AMBULATORY_CARE_PROVIDER_SITE_OTHER): Payer: PPO

## 2020-05-30 DIAGNOSIS — Z1211 Encounter for screening for malignant neoplasm of colon: Secondary | ICD-10-CM | POA: Diagnosis not present

## 2020-05-30 LAB — FECAL OCCULT BLOOD, IMMUNOCHEMICAL: Fecal Occult Bld: NEGATIVE

## 2020-08-05 ENCOUNTER — Other Ambulatory Visit: Payer: Self-pay | Admitting: *Deleted

## 2020-08-05 DIAGNOSIS — C61 Malignant neoplasm of prostate: Secondary | ICD-10-CM

## 2020-08-05 MED ORDER — ABIRATERONE ACETATE 250 MG PO TABS: 1000.0000 mg | ORAL_TABLET | Freq: Every day | ORAL | 10 refills | Status: DC

## 2020-08-07 ENCOUNTER — Other Ambulatory Visit: Payer: Self-pay | Admitting: Oncology

## 2020-08-19 ENCOUNTER — Other Ambulatory Visit: Payer: Self-pay | Admitting: Oncology

## 2020-08-19 ENCOUNTER — Telehealth: Payer: Self-pay | Admitting: Oncology

## 2020-08-19 DIAGNOSIS — C61 Malignant neoplasm of prostate: Secondary | ICD-10-CM

## 2020-08-19 NOTE — Telephone Encounter (Signed)
Rescheduled per provider pal, called patient regarding 09/15 appointment. Left a voicemail.

## 2020-08-20 ENCOUNTER — Inpatient Hospital Stay: Payer: PPO | Admitting: Oncology

## 2020-08-20 ENCOUNTER — Inpatient Hospital Stay: Payer: PPO | Attending: Oncology

## 2020-08-20 ENCOUNTER — Inpatient Hospital Stay: Payer: PPO

## 2020-08-20 DIAGNOSIS — C61 Malignant neoplasm of prostate: Secondary | ICD-10-CM | POA: Insufficient documentation

## 2020-08-20 DIAGNOSIS — C7911 Secondary malignant neoplasm of bladder: Secondary | ICD-10-CM | POA: Insufficient documentation

## 2020-08-26 ENCOUNTER — Other Ambulatory Visit: Payer: PPO

## 2020-08-26 ENCOUNTER — Ambulatory Visit: Payer: PPO

## 2020-08-26 ENCOUNTER — Ambulatory Visit: Payer: PPO | Admitting: Oncology

## 2020-09-02 ENCOUNTER — Telehealth: Payer: Self-pay | Admitting: Oncology

## 2020-09-02 DIAGNOSIS — N130 Hydronephrosis with ureteropelvic junction obstruction: Secondary | ICD-10-CM | POA: Diagnosis not present

## 2020-09-02 NOTE — Telephone Encounter (Signed)
Rescheduled appt per 9/28 sch msg - pt wife aware.

## 2020-09-03 ENCOUNTER — Inpatient Hospital Stay: Payer: PPO

## 2020-09-03 ENCOUNTER — Other Ambulatory Visit: Payer: Self-pay

## 2020-09-03 VITALS — BP 138/69 | HR 58 | Resp 18

## 2020-09-03 DIAGNOSIS — C7911 Secondary malignant neoplasm of bladder: Secondary | ICD-10-CM | POA: Diagnosis not present

## 2020-09-03 DIAGNOSIS — C61 Malignant neoplasm of prostate: Secondary | ICD-10-CM

## 2020-09-03 LAB — CMP (CANCER CENTER ONLY)
ALT: 38 U/L (ref 0–44)
AST: 33 U/L (ref 15–41)
Albumin: 3.9 g/dL (ref 3.5–5.0)
Alkaline Phosphatase: 49 U/L (ref 38–126)
Anion gap: 5 (ref 5–15)
BUN: 19 mg/dL (ref 8–23)
CO2: 30 mmol/L (ref 22–32)
Calcium: 9.7 mg/dL (ref 8.9–10.3)
Chloride: 105 mmol/L (ref 98–111)
Creatinine: 1.34 mg/dL — ABNORMAL HIGH (ref 0.61–1.24)
GFR, Est AFR Am: >60 mL/min (ref >60)
GFR, Estimated: 53 mL/min — ABNORMAL LOW (ref >60)
Glucose, Bld: 79 mg/dL (ref 70–99)
Potassium: 4.1 mmol/L (ref 3.5–5.1)
Sodium: 140 mmol/L (ref 135–145)
Total Bilirubin: 0.9 mg/dL (ref 0.3–1.2)
Total Protein: 7.1 g/dL (ref 6.5–8.1)

## 2020-09-03 LAB — CBC WITH DIFFERENTIAL (CANCER CENTER ONLY)
Abs Immature Granulocytes: 0.06 10*3/uL (ref 0.00–0.07)
Basophils Absolute: 0 10*3/uL (ref 0.0–0.1)
Basophils Relative: 1 %
Eosinophils Absolute: 0.4 10*3/uL (ref 0.0–0.5)
Eosinophils Relative: 8 %
HCT: 36.7 % — ABNORMAL LOW (ref 39.0–52.0)
Hemoglobin: 12.3 g/dL — ABNORMAL LOW (ref 13.0–17.0)
Immature Granulocytes: 1 %
Lymphocytes Relative: 24 %
Lymphs Abs: 1.2 10*3/uL (ref 0.7–4.0)
MCH: 32.6 pg (ref 26.0–34.0)
MCHC: 33.5 g/dL (ref 30.0–36.0)
MCV: 97.3 fL (ref 80.0–100.0)
Monocytes Absolute: 0.5 10*3/uL (ref 0.1–1.0)
Monocytes Relative: 10 %
Neutro Abs: 2.8 10*3/uL (ref 1.7–7.7)
Neutrophils Relative %: 56 %
Platelet Count: 217 10*3/uL (ref 150–400)
RBC: 3.77 MIL/uL — ABNORMAL LOW (ref 4.22–5.81)
RDW: 12.4 % (ref 11.5–15.5)
WBC Count: 5 10*3/uL (ref 4.0–10.5)
nRBC: 0 % (ref 0.0–0.2)

## 2020-09-03 MED ORDER — LEUPROLIDE ACETATE (4 MONTH) 30 MG ~~LOC~~ KIT
30.0000 mg | PACK | Freq: Once | SUBCUTANEOUS | Status: AC
  Administered 2020-09-03: 30 mg via SUBCUTANEOUS

## 2020-09-03 MED ORDER — LEUPROLIDE ACETATE (4 MONTH) 30 MG ~~LOC~~ KIT
PACK | SUBCUTANEOUS | Status: AC
  Filled 2020-09-03: qty 30

## 2020-09-03 MED ORDER — LEUPROLIDE ACETATE (4 MONTH) 30 MG IM KIT: 30.0000 mg | PACK | Freq: Once | INTRAMUSCULAR | Status: DC

## 2020-09-03 NOTE — Patient Instructions (Signed)
Leuprolide injection What is this medicine? LEUPROLIDE (loo PROE lide) is a man-made hormone. It is used to treat the symptoms of prostate cancer. This medicine may also be used to treat children with early onset of puberty. It may be used for other hormonal conditions. This medicine may be used for other purposes; ask your health care provider or pharmacist if you have questions. COMMON BRAND NAME(S): Lupron What should I tell my health care provider before I take this medicine? They need to know if you have any of these conditions:  diabetes  heart disease or previous heart attack  high blood pressure  high cholesterol  pain or difficulty passing urine  spinal cord metastasis  stroke  tobacco smoker  an unusual or allergic reaction to leuprolide, benzyl alcohol, other medicines, foods, dyes, or preservatives  pregnant or trying to get pregnant  breast-feeding How should I use this medicine? This medicine is for injection under the skin or into a muscle. You will be taught how to prepare and give this medicine. Use exactly as directed. Take your medicine at regular intervals. Do not take your medicine more often than directed. It is important that you put your used needles and syringes in a special sharps container. Do not put them in a trash can. If you do not have a sharps container, call your pharmacist or healthcare provider to get one. A special MedGuide will be given to you by the pharmacist with each prescription and refill. Be sure to read this information carefully each time. Talk to your pediatrician regarding the use of this medicine in children. While this medicine may be prescribed for children as young as 8 years for selected conditions, precautions do apply. Overdosage: If you think you have taken too much of this medicine contact a poison control center or emergency room at once. NOTE: This medicine is only for you. Do not share this medicine with others. What if  I miss a dose? If you miss a dose, take it as soon as you can. If it is almost time for your next dose, take only that dose. Do not take double or extra doses. What may interact with this medicine? Do not take this medicine with any of the following medications:  chasteberry This medicine may also interact with the following medications:  herbal or dietary supplements, like black cohosh or DHEA  male hormones, like estrogens or progestins and birth control pills, patches, rings, or injections  male hormones, like testosterone This list may not describe all possible interactions. Give your health care provider a list of all the medicines, herbs, non-prescription drugs, or dietary supplements you use. Also tell them if you smoke, drink alcohol, or use illegal drugs. Some items may interact with your medicine. What should I watch for while using this medicine? Visit your doctor or health care professional for regular checks on your progress. During the first week, your symptoms may get worse, but then will improve as you continue your treatment. You may get hot flashes, increased bone pain, increased difficulty passing urine, or an aggravation of nerve symptoms. Discuss these effects with your doctor or health care professional, some of them may improve with continued use of this medicine. Male patients may experience a menstrual cycle or spotting during the first 2 months of therapy with this medicine. If this continues, contact your doctor or health care professional. This medicine may increase blood sugar. Ask your healthcare provider if changes in diet or medicines are needed if   you have diabetes. What side effects may I notice from receiving this medicine? Side effects that you should report to your doctor or health care professional as soon as possible:  allergic reactions like skin rash, itching or hives, swelling of the face, lips, or tongue  breathing problems  chest  pain  depression or memory disorders  pain in your legs or groin  pain at site where injected  severe headache  signs and symptoms of high blood sugar such as being more thirsty or hungry or having to urinate more than normal. You may also feel very tired or have blurry vision  swelling of the feet and legs  visual changes  vomiting Side effects that usually do not require medical attention (report to your doctor or health care professional if they continue or are bothersome):  breast swelling or tenderness  decrease in sex drive or performance  diarrhea  hot flashes  loss of appetite  muscle, joint, or bone pains  nausea  redness or irritation at site where injected  skin problems or acne This list may not describe all possible side effects. Call your doctor for medical advice about side effects. You may report side effects to FDA at 1-800-FDA-1088. Where should I keep my medicine? Keep out of the reach of children. Store below 25 degrees C (77 degrees F). Do not freeze. Protect from light. Do not use if it is not clear or if there are particles present. Throw away any unused medicine after the expiration date. NOTE: This sheet is a summary. It may not cover all possible information. If you have questions about this medicine, talk to your doctor, pharmacist, or health care provider.  2020 Elsevier/Gold Standard (2018-09-21 09:52:48)  

## 2020-09-04 LAB — PROSTATE-SPECIFIC AG, SERUM (LABCORP): Prostate Specific Ag, Serum: <0.1 ng/mL (ref 0.0–4.0)

## 2020-09-10 ENCOUNTER — Inpatient Hospital Stay: Payer: PPO

## 2020-09-10 ENCOUNTER — Inpatient Hospital Stay: Payer: PPO | Admitting: Oncology

## 2020-09-16 DIAGNOSIS — C61 Malignant neoplasm of prostate: Secondary | ICD-10-CM | POA: Diagnosis not present

## 2020-09-16 DIAGNOSIS — C778 Secondary and unspecified malignant neoplasm of lymph nodes of multiple regions: Secondary | ICD-10-CM | POA: Diagnosis not present

## 2020-09-16 DIAGNOSIS — C7951 Secondary malignant neoplasm of bone: Secondary | ICD-10-CM | POA: Diagnosis not present

## 2020-09-24 ENCOUNTER — Other Ambulatory Visit: Payer: Self-pay

## 2020-09-24 ENCOUNTER — Inpatient Hospital Stay: Payer: PPO | Attending: Oncology | Admitting: Oncology

## 2020-09-24 DIAGNOSIS — C7951 Secondary malignant neoplasm of bone: Secondary | ICD-10-CM | POA: Diagnosis not present

## 2020-09-24 DIAGNOSIS — C61 Malignant neoplasm of prostate: Secondary | ICD-10-CM | POA: Insufficient documentation

## 2020-09-24 DIAGNOSIS — Z7952 Long term (current) use of systemic steroids: Secondary | ICD-10-CM | POA: Diagnosis not present

## 2020-09-24 DIAGNOSIS — Z86711 Personal history of pulmonary embolism: Secondary | ICD-10-CM | POA: Diagnosis not present

## 2020-09-24 DIAGNOSIS — N1330 Unspecified hydronephrosis: Secondary | ICD-10-CM | POA: Insufficient documentation

## 2020-09-24 DIAGNOSIS — Z791 Long term (current) use of non-steroidal anti-inflammatories (NSAID): Secondary | ICD-10-CM | POA: Insufficient documentation

## 2020-09-24 DIAGNOSIS — Z79899 Other long term (current) drug therapy: Secondary | ICD-10-CM | POA: Insufficient documentation

## 2020-09-24 NOTE — Progress Notes (Signed)
Hematology and Oncology Follow Up Visit  Shane [name] [id number] [birth year] 69 y.o. 09/24/2020 8:11 AM [name], MDGutierrez, [name], MD   Principle Diagnosis: 69 year old man with advanced prostate cancer diagnosed in July 2020. He presented with Gleason score of 4+4 = 9, lymphadenopathy and bone disease indicating castration-sensitive disease.   Prior Therapy:  He is status post TURBT and stent placement bilaterally the pathology showed Gleason score 5+4 = 9 prostate cancer invading into the bladder and the lower heartburn 2020.  Current therapy:   Eligard 30 mg every 4 months. Last treatment given on 09/03/2020.  Zytiga 1000 mg daily with prednisone 5 mg started in September 2020.  Interim History: Shane [name] returns today for a repeat evaluation. Since the last visit, he reports no major changes in his health. He denies any complications related to Zytiga. Denies any excessive fatigue or tiredness. He denies any bone pain or pathological fractures. Formal status quality of life remain excellent.         Medications: Unchanged on review. Current Outpatient Medications  Medication Sig Dispense Refill  . abiraterone acetate (ZYTIGA) 250 MG tablet Take 4 tablets (1,000 mg total) by mouth daily. Take on an empty stomach 1 hour before or 2 hours after a meal 120 tablet 10  . meloxicam (MOBIC) 15 MG tablet Take 1 tablet (15 mg total) by mouth daily as needed for pain.    Marland Kitchen MYRBETRIQ 50 MG TB24 tablet Take 50 mg by mouth daily.    . predniSONE (DELTASONE) 5 MG tablet TAKE ONE TABLET BY MOUTH DAILY WITH BREAKFAST 90 tablet 0  . tamsulosin (FLOMAX) 0.4 MG CAPS capsule Take 0.4 mg by mouth daily.    Marland Kitchen venlafaxine (EFFEXOR) 25 MG tablet Take 25 mg by mouth daily.     No current facility-administered medications for this visit.     Allergies:  Allergies  Allergen Reactions  . Bactrim [Sulfamethoxazole-Trimethoprim] Hives    Red rash, hives  . Hydrocodone   .  Percocet [Oxycodone-Acetaminophen] Other (See Comments)    TURNS RED FROM CHEST UP, LOOKS LIKE SUNBURN AND STARTS TO [place] BADLY  . Vancomycin   . Penicillins Swelling and Rash    Did it involve swelling of the face/tongue/throat, SOB, or low BP? No Did it involve sudden or severe rash/hives, skin peeling, or any reaction on the inside of your mouth or nose? No Did you need to seek medical attention at a hospital or doctor's office? No When did it last happen?50 years ago If all above answers are "NO", may proceed with cephalosporin use.   Marland Kitchen Zithromax [Azithromycin] Rash        Physical Exam:      ECOG: 0    General appearance: Comfortable appearing without any discomfort Head: Normocephalic without any trauma Oropharynx: Mucous membranes are moist and pink without any thrush or ulcers. Eyes: Pupils are equal and round reactive to light. Lymph nodes: No cervical, supraclavicular, inguinal or axillary lymphadenopathy.   Heart:regular rate and rhythm.  S1 and S2 without leg edema. Lung: Clear without any rhonchi or wheezes.  No dullness to percussion. Abdomin: Soft, nontender, nondistended with good bowel sounds.  No hepatosplenomegaly. Musculoskeletal: No joint deformity or effusion.  Full range of motion noted. Neurological: No deficits noted on motor, sensory and deep tendon reflex exam. Skin: No petechial rash or dryness.  Appeared moist.          Lab Results: Lab Results  Component Value Date   WBC 5.0 09/03/2020  HGB 12.3 (L) 09/03/2020   HCT 36.7 (L) 09/03/2020   MCV 97.3 09/03/2020   PLT 217 09/03/2020     Chemistry      Component Value Date/Time   NA 140 09/03/2020 0934   NA 141 01/08/2020 0000   K 4.1 09/03/2020 0934   CL 105 09/03/2020 0934   CO2 30 09/03/2020 0934   BUN 19 09/03/2020 0934   CREATININE 1.34 (H) 09/03/2020 0934   GLU 113 01/08/2020 0000      Component Value Date/Time   CALCIUM 9.7 09/03/2020 0934   ALKPHOS 49  09/03/2020 0934   AST 33 09/03/2020 0934   ALT 38 09/03/2020 0934   BILITOT 0.9 09/03/2020 0934       Results for Shane [name], Shane [name] (MRN [id number]) as of 09/24/2020 08:12  Ref. Range 05/21/2020 08:55 09/03/2020 09:34  Prostate Specific Ag, Serum Latest Ref Range: 0.0 - 4.0 ng/mL <0.1 <0.1       Impression and Plan:   69 year old with:  1.   Advanced prostate cancer with disease to the bone and lymphadenopathy diagnosed in July 2020. He has castration-sensitive at this time.  He is currently on Zytiga which she has tolerated very well. His PSA continues to be undetectable without any evidence of disease relapse. Risks and benefits of follow-up continuing Zytiga for the time being were discussed. Complications including hypertension, weight gain and adrenal insufficiency were reiterated. Alternative options such as systemic chemotherapy among others will be deferred unless he develops castration-resistant disease. He is agreeable with this plan. We'll update his staging scans in 7290 and certainly sooner if he developed any new symptoms.   2.  Pulmonary embolism: No evidence of relapse at this time after completing Xarelto.  3.  Bilateral hydronephrosis: Kidney function has normalized without any nephrostomy tubes in place.  4.  Androgen deprivation: He is currently on Eligard 30 mg every 4 months with the last injection given September this year. This will be repeated in 4 months.  6.  Follow-up: In 4 months for repeat follow-up.  30 minutes were dedicated to this visit. The time was spent on reviewing laboratory data, discussing disease status, treatment options and future plan of care review.  Zola Button, MD 10/20/20218:11 AM

## 2020-11-05 ENCOUNTER — Other Ambulatory Visit: Payer: Self-pay | Admitting: Oncology

## 2020-12-03 ENCOUNTER — Telehealth: Payer: Self-pay

## 2020-12-03 NOTE — Telephone Encounter (Signed)
Patient called to let me know that he had been auto re-enrolled in [employer] for Zytiga at no cost 12/06/20-12/05/21  [facility] phone [phone]  Cruzita Lederer [facility] Phone [phone] Fax [phone] 12/03/2020 11:11 AM

## 2021-01-02 ENCOUNTER — Inpatient Hospital Stay: Payer: PPO | Attending: Oncology

## 2021-01-02 ENCOUNTER — Inpatient Hospital Stay: Payer: PPO

## 2021-01-02 ENCOUNTER — Inpatient Hospital Stay: Payer: PPO | Admitting: Oncology

## 2021-01-02 ENCOUNTER — Other Ambulatory Visit: Payer: Self-pay

## 2021-01-02 VITALS — BP 158/69 | HR 62 | Temp 98.2°F | Resp 18 | Ht 71.0 in | Wt 198.5 lb

## 2021-01-02 DIAGNOSIS — I10 Essential (primary) hypertension: Secondary | ICD-10-CM | POA: Insufficient documentation

## 2021-01-02 DIAGNOSIS — Z79899 Other long term (current) drug therapy: Secondary | ICD-10-CM | POA: Insufficient documentation

## 2021-01-02 DIAGNOSIS — Z7952 Long term (current) use of systemic steroids: Secondary | ICD-10-CM | POA: Insufficient documentation

## 2021-01-02 DIAGNOSIS — C7951 Secondary malignant neoplasm of bone: Secondary | ICD-10-CM | POA: Insufficient documentation

## 2021-01-02 DIAGNOSIS — C61 Malignant neoplasm of prostate: Secondary | ICD-10-CM

## 2021-01-02 LAB — CMP (CANCER CENTER ONLY)
ALT: 20 U/L (ref 0–44)
AST: 24 U/L (ref 15–41)
Albumin: 4.2 g/dL (ref 3.5–5.0)
Alkaline Phosphatase: 50 U/L (ref 38–126)
Anion gap: 9 (ref 5–15)
BUN: 18 mg/dL (ref 8–23)
CO2: 25 mmol/L (ref 22–32)
Calcium: 9.7 mg/dL (ref 8.9–10.3)
Chloride: 105 mmol/L (ref 98–111)
Creatinine: 1.28 mg/dL — ABNORMAL HIGH (ref 0.61–1.24)
GFR, Estimated: >60 mL/min (ref >60)
Glucose, Bld: 116 mg/dL — ABNORMAL HIGH (ref 70–99)
Potassium: 4.3 mmol/L (ref 3.5–5.1)
Sodium: 139 mmol/L (ref 135–145)
Total Bilirubin: 0.6 mg/dL (ref 0.3–1.2)
Total Protein: 7.4 g/dL (ref 6.5–8.1)

## 2021-01-02 LAB — CBC WITH DIFFERENTIAL (CANCER CENTER ONLY)
Abs Immature Granulocytes: 0.07 10*3/uL (ref 0.00–0.07)
Basophils Absolute: 0 10*3/uL (ref 0.0–0.1)
Basophils Relative: 0 %
Eosinophils Absolute: 0.2 10*3/uL (ref 0.0–0.5)
Eosinophils Relative: 3 %
HCT: 38.9 % — ABNORMAL LOW (ref 39.0–52.0)
Hemoglobin: 13 g/dL (ref 13.0–17.0)
Immature Granulocytes: 1 %
Lymphocytes Relative: 11 %
Lymphs Abs: 0.8 10*3/uL (ref 0.7–4.0)
MCH: 31.8 pg (ref 26.0–34.0)
MCHC: 33.4 g/dL (ref 30.0–36.0)
MCV: 95.1 fL (ref 80.0–100.0)
Monocytes Absolute: 0.4 10*3/uL (ref 0.1–1.0)
Monocytes Relative: 6 %
Neutro Abs: 5.4 10*3/uL (ref 1.7–7.7)
Neutrophils Relative %: 79 %
Platelet Count: 217 10*3/uL (ref 150–400)
RBC: 4.09 MIL/uL — ABNORMAL LOW (ref 4.22–5.81)
RDW: 12.1 % (ref 11.5–15.5)
WBC Count: 6.9 10*3/uL (ref 4.0–10.5)
nRBC: 0 % (ref 0.0–0.2)

## 2021-01-02 MED ORDER — LEUPROLIDE ACETATE (4 MONTH) 30 MG ~~LOC~~ KIT
PACK | SUBCUTANEOUS | Status: AC
  Filled 2021-01-02: qty 30

## 2021-01-02 MED ORDER — LEUPROLIDE ACETATE (4 MONTH) 30 MG ~~LOC~~ KIT
30.0000 mg | PACK | Freq: Once | SUBCUTANEOUS | Status: AC
  Administered 2021-01-02: 30 mg via SUBCUTANEOUS

## 2021-01-02 NOTE — Patient Instructions (Signed)

## 2021-01-02 NOTE — Progress Notes (Signed)
Hematology and Oncology Follow Up Visit  Shane [name] [id number] 11/06/1951 70 y.o. 01/02/2021 1:01 PM [name], MDGutierrez, [name], MD   Principle Diagnosis: 70 year old man with castration-sensitive advanced prostate cancer with lymphadenopathy and bone disease diagnosed in July 2020. He was found to have Gleason score of 5+4 = 9 at the time of diagnosis.   Prior Therapy:  He is status post TURBT and stent placement bilaterally the pathology showed Gleason score 5+4 = 9 prostate cancer invading into the bladder.  Current therapy:   Eligard 30 mg every 4 months.  Last treatment given in September 2021 and will be repeated today.  Zytiga 1000 mg daily with prednisone 5 mg started in September 2020.  Interim History: Mr. [name] is here for a follow-up visit.  Since the last visit, he reports no major changes in his health.  He denies any nausea, vomiting or abdominal pain.  He has reported hot flashes related to Eligard.  He denies any recent hospitalization or illnesses.  He denies any complications related to Zytiga.  He denies any edema or excessive fatigue.        Medications: Reviewed without changes. Current Outpatient Medications  Medication Sig Dispense Refill  . abiraterone acetate (ZYTIGA) 250 MG tablet Take 4 tablets (1,000 mg total) by mouth daily. Take on an empty stomach 1 hour before or 2 hours after a meal 120 tablet 10  . meloxicam (MOBIC) 15 MG tablet Take 1 tablet (15 mg total) by mouth daily as needed for pain.    Marland Kitchen MYRBETRIQ 50 MG TB24 tablet Take 50 mg by mouth daily.    . predniSONE (DELTASONE) 5 MG tablet TAKE ONE TABLET BY MOUTH DAILY WITH BREAKFAST 90 tablet 0  . tamsulosin (FLOMAX) 0.4 MG CAPS capsule Take 0.4 mg by mouth daily.    Marland Kitchen venlafaxine (EFFEXOR) 25 MG tablet Take 25 mg by mouth daily.     No current facility-administered medications for this visit.     Allergies:  Allergies  Allergen Reactions  . Bactrim  [Sulfamethoxazole-Trimethoprim] Hives    Red rash, hives  . Hydrocodone   . Percocet [Oxycodone-Acetaminophen] Other (See Comments)    TURNS RED FROM CHEST UP, LOOKS LIKE SUNBURN AND STARTS TO [place] BADLY  . Vancomycin   . Penicillins Swelling and Rash    Did it involve swelling of the face/tongue/throat, SOB, or low BP? No Did it involve sudden or severe rash/hives, skin peeling, or any reaction on the inside of your mouth or nose? No Did you need to seek medical attention at a hospital or doctor's office? No When did it last happen?50 years ago If all above answers are "NO", may proceed with cephalosporin use.   Marland Kitchen Zithromax [Azithromycin] Rash        Physical Exam:   Blood pressure (!) 158/69, pulse 62, temperature 98.2 F (36.8 C), temperature source Tympanic, resp. rate 18, height 5\' 11"  (1.803 m), weight 198 lb 8 oz (90 kg), SpO2 100 %.     ECOG: 0    General appearance: Alert, awake without any distress. Head: Atraumatic without abnormalities Oropharynx: Without any thrush or ulcers. Eyes: No scleral icterus. Lymph nodes: No lymphadenopathy noted in the cervical, supraclavicular, or axillary nodes Heart:regular rate and rhythm, without any murmurs or gallops.   Lung: Clear to auscultation without any rhonchi, wheezes or dullness to percussion. Abdomin: Soft, nontender without any shifting dullness or ascites. Musculoskeletal: No clubbing or cyanosis. Neurological: No motor or sensory deficits. Skin: No rashes or  lesions.          Lab Results: Lab Results  Component Value Date   WBC 5.0 09/03/2020   HGB 12.3 (L) 09/03/2020   HCT 36.7 (L) 09/03/2020   MCV 97.3 09/03/2020   PLT 217 09/03/2020     Chemistry      Component Value Date/Time   NA 140 09/03/2020 0934   NA 141 01/08/2020 0000   K 4.1 09/03/2020 0934   CL 105 09/03/2020 0934   CO2 30 09/03/2020 0934   BUN 19 09/03/2020 0934   CREATININE 1.34 (H) 09/03/2020 0934   GLU 113  01/08/2020 0000      Component Value Date/Time   CALCIUM 9.7 09/03/2020 0934   ALKPHOS 49 09/03/2020 0934   AST 33 09/03/2020 0934   ALT 38 09/03/2020 0934   BILITOT 0.9 09/03/2020 0934         Results for Shane [name], Shane [name] (MRN [id number]) as of 01/02/2021 13:04  Ref. Range 09/03/2020 09:34  Prostate Specific Ag, Serum Latest Ref Range: 0.0 - 4.0 ng/mL <0.1      Impression and Plan:   70 year old with:  1.    Castration-sensitive advanced prostate cancer with disease to the bone and lymphadenopathy diagnosed in July 2020.   The natural course of this disease was reviewed at this time and treatment options were reiterated.  He is currently on Zytiga which he has tolerated very well without any complaints.  His PSA continues to be undetectable which indicates positive response to therapy.  Long-term issues associated with this medication plan hypertension, renal insufficiency among others were discussed.  Alternative options such as systemic chemotherapy will be deferred unless he has castration resistant disease.  He is agreeable to continue at this time and we will update his staging scans in the near future.   2.    Bone directed therapy: I recommended calcium and vitamin D supplements and obtaining dental clearance for possible Xgeva start in the future.  He has deferred this option for the time being.  3.  Bilateral hydronephrosis: Related to his prostate cancer and has resolved at this time.  Repeat imaging studies in the near future we will update his findings.  4.  Androgen deprivation: I recommended continuing Eligard indefinitely.  Complications that include weight gain, hot flashes and sexual dysfunction.  He will receive it today and repeated in 4 months.  5.  Lower urinary tract symptoms: Manageable at this time with the current treatment.  6.  Follow-up: He will return in 4 months for repeat follow-up.  30 minutes were spent on this encounter.  Time was  dedicated to reviewing his disease status, reviewing laboratory data and treatment options for the future.  Zola Button, MD 1/28/20221:01 PM

## 2021-01-03 LAB — PROSTATE-SPECIFIC AG, SERUM (LABCORP): Prostate Specific Ag, Serum: <0.1 ng/mL (ref 0.0–4.0)

## 2021-01-05 ENCOUNTER — Telehealth: Payer: Self-pay

## 2021-01-05 NOTE — Telephone Encounter (Signed)
-----   Message from Shane Portela, MD sent at 01/05/2021  9:36 AM EST ----- Please let him know his PSA is still low

## 2021-01-05 NOTE — Telephone Encounter (Signed)
I have left a message for the pt advising as indicated. Advised to call back if he has any questions/concerns.

## 2021-01-15 ENCOUNTER — Telehealth: Payer: Self-pay

## 2021-01-15 ENCOUNTER — Other Ambulatory Visit: Payer: Self-pay

## 2021-01-15 ENCOUNTER — Ambulatory Visit (HOSPITAL_COMMUNITY)
Admission: RE | Admit: 2021-01-15 | Discharge: 2021-01-15 | Disposition: A | Discharge status: Home / Self Care | Payer: PPO | Source: Ambulatory Visit | Attending: Oncology | Admitting: Oncology

## 2021-01-15 DIAGNOSIS — C61 Malignant neoplasm of prostate: Secondary | ICD-10-CM | POA: Diagnosis not present

## 2021-01-15 IMAGING — CT CT CHEST-ABD-PELV W/ CM
3 of 5 series · 14 of 36 positions shown, 16 images · IV contrast (OMNIPAQUE)
Comparison: [DATE]

CLINICAL DATA: Prostate cancer, assess treatment response, ongoing
hormone and chemotherapy

EXAM:
CT CHEST, ABDOMEN, AND PELVIS WITH CONTRAST
TECHNIQUE: Multidetector CT imaging of the chest, abdomen and pelvis was
performed following the standard protocol during bolus
administration of intravenous contrast.
CONTRAST:  100mL OMNIPAQUE IOHEXOL 300 MG/ML SOLN, additional oral
enteric contrast

[Series 2: cap with · axial · 0.86mm/px · z∈[-686,-111]mm · 9 of 145 slices shown, 11 images]
[im 15/145  mediastinal]
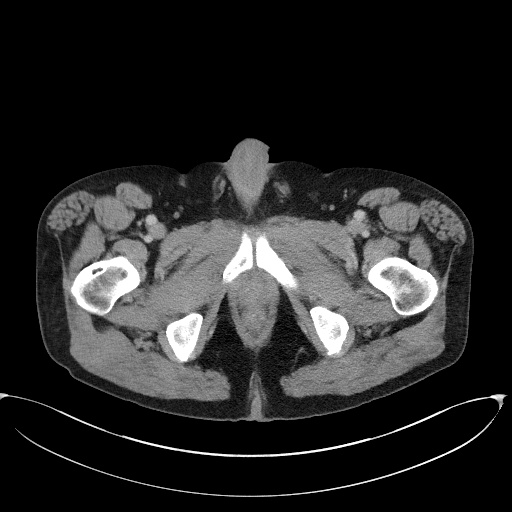
[im 15/145  bone]
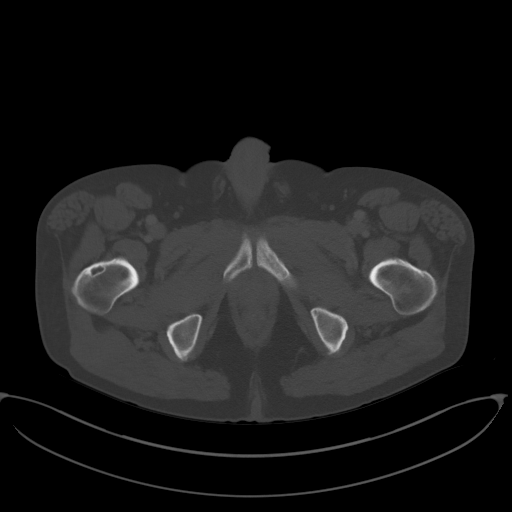
[im 29/145  mediastinal]
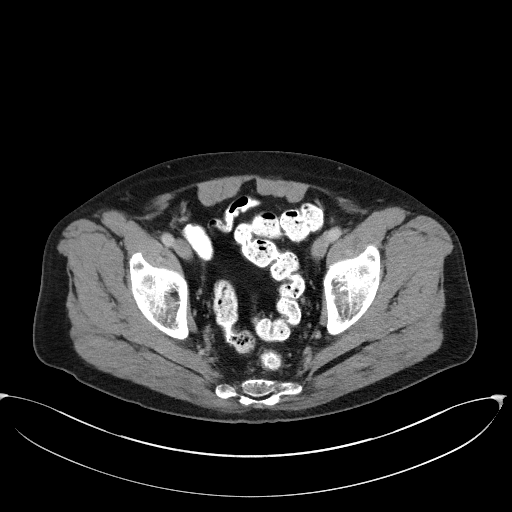
[im 44/145  mediastinal]
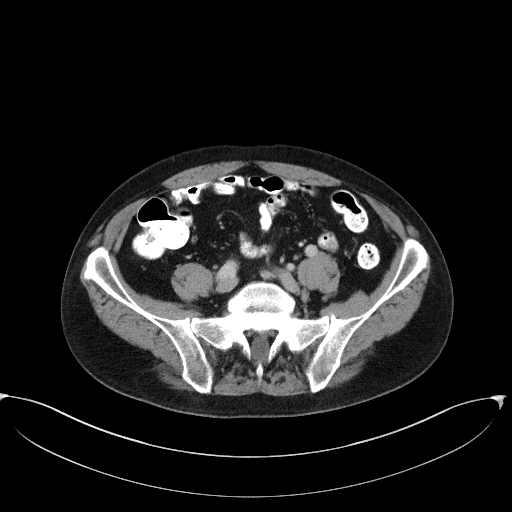
[im 58/145  mediastinal]
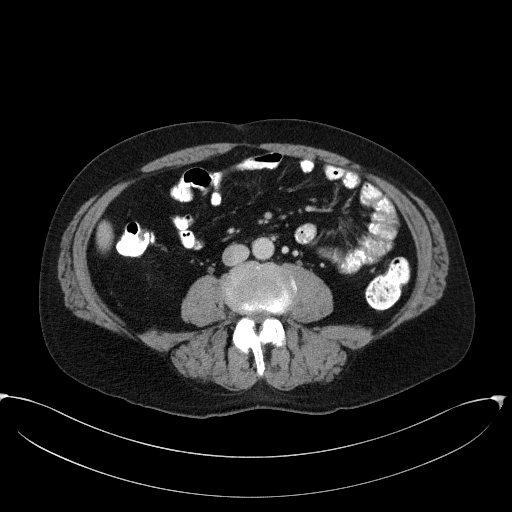
[im 73/145  mediastinal]
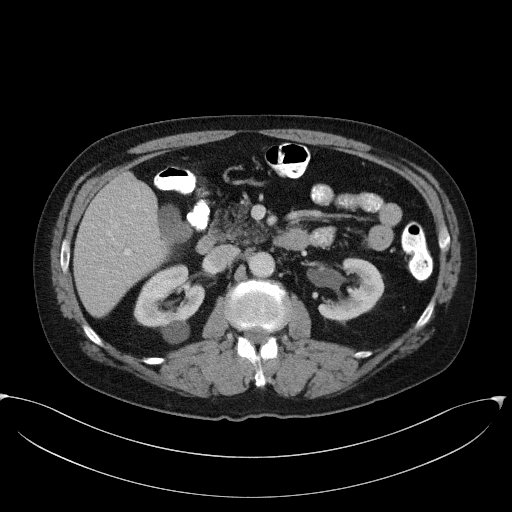
[im 87/145  mediastinal]
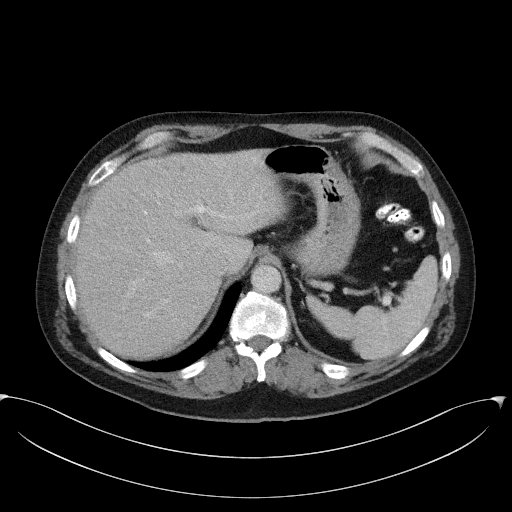
[im 101/145  mediastinal]
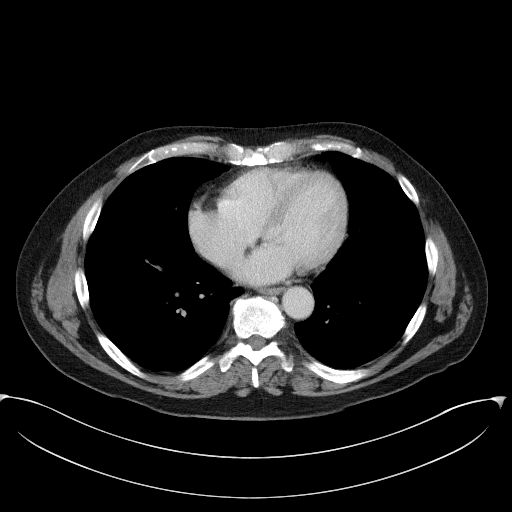
[im 116/145  mediastinal]
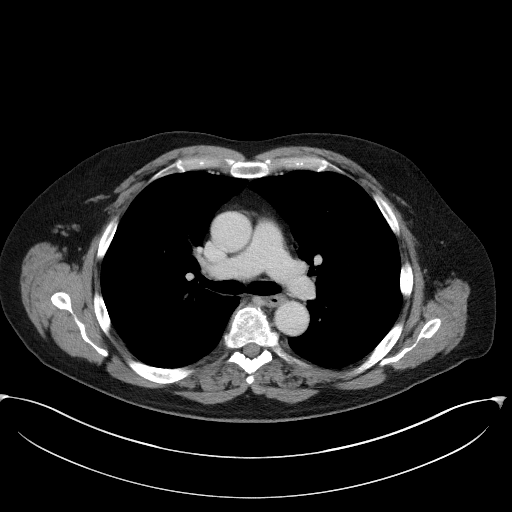
[im 130/145  mediastinal]
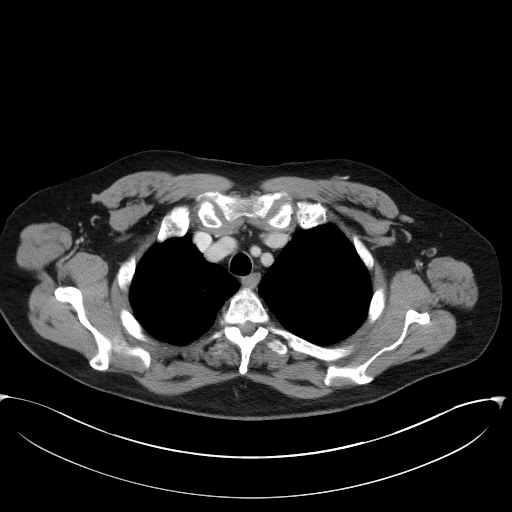
[im 130/145  bone]
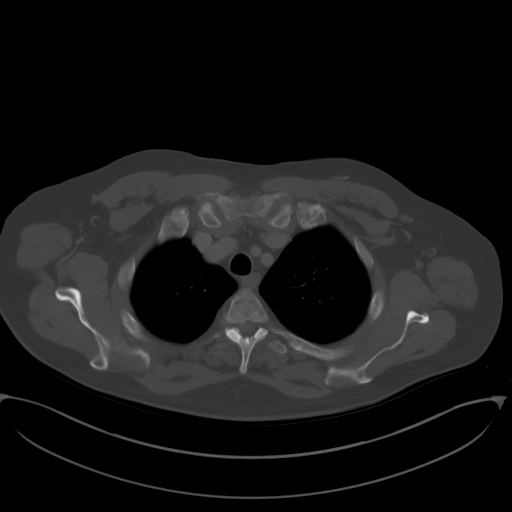

[Series 4: lung · axial · 0.86mm/px · z∈[-368,-312]mm · 2 of 180 slices shown]
[im 14/180  bone]
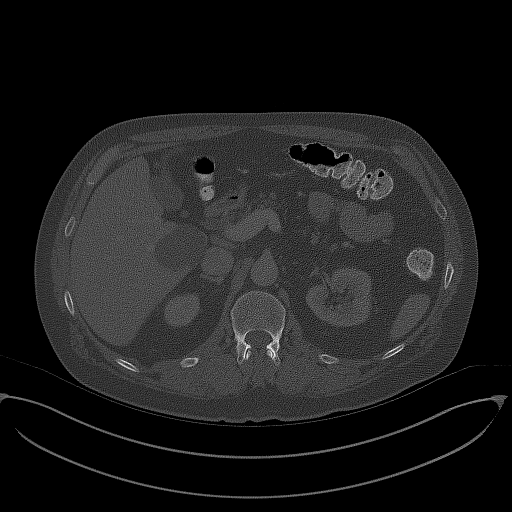
[im 42/180  bone]
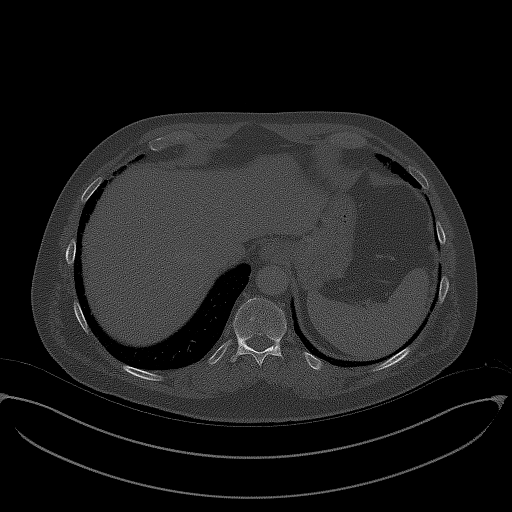

[Series 5: coronals · coronal · 0.86mm/px · 3 of 156 slices shown]
[im 32/156  mediastinal]
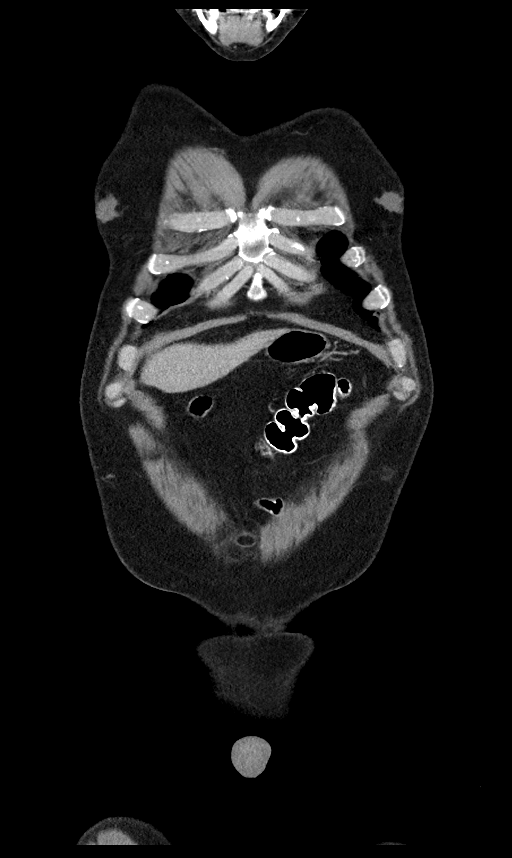
[im 63/156  mediastinal]
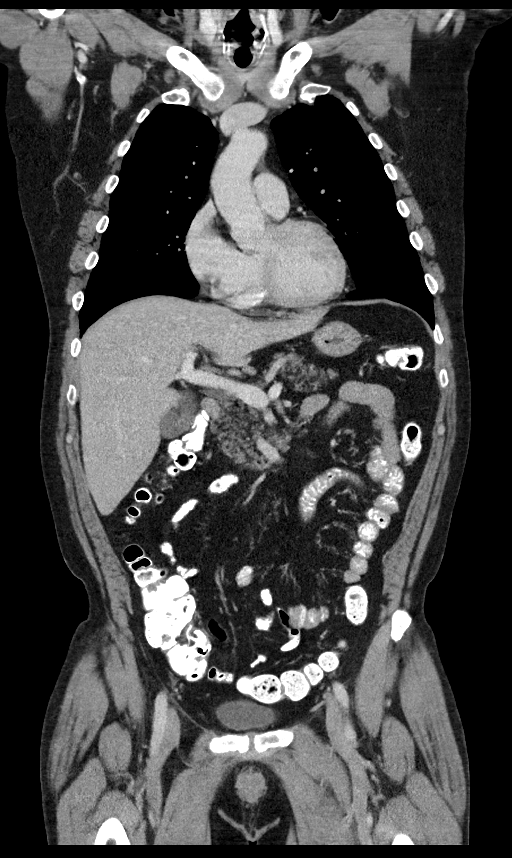
[im 94/156  mediastinal]
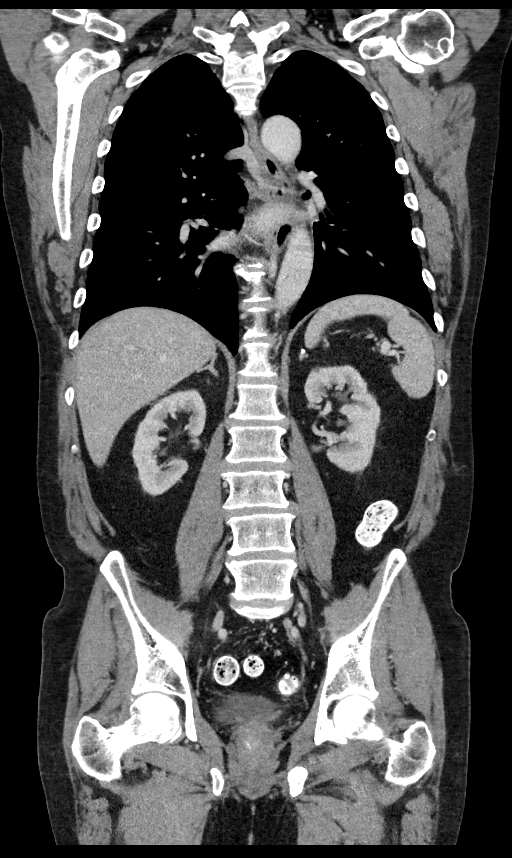

[14 of 36 positions shown; findings below may reference images not displayed]

FINDINGS: CT CHEST FINDINGS

Cardiovascular: Three-vessel coronary artery calcifications. Normal
heart size. No pericardial effusion.

Mediastinum/Nodes: No enlarged mediastinal, hilar, or axillary lymph
nodes. Thyroid gland, trachea, and esophagus demonstrate no
significant findings.

Lungs/Pleura: Unchanged 6 mm pulmonary nodule of the dependent right
lower lobe (series 4, image 111). No pleural effusion or
pneumothorax.

Musculoskeletal: No chest wall mass or suspicious bone lesions
identified.

CT ABDOMEN PELVIS FINDINGS

Hepatobiliary: No solid liver abnormality is seen. No gallstones,
gallbladder wall thickening, or biliary dilatation.

Pancreas: Unremarkable. No pancreatic ductal dilatation or
surrounding inflammatory changes.

Spleen: Normal in size without significant abnormality.

Adrenals/Urinary Tract: Adrenal glands are unremarkable. Tiny
nonobstructive calculus of the inferior pole of the left kidney.
Previously noted bilateral hydronephrosis is essentially resolved,
with mild persistent prominence of the renal pelves and ureters,
patent to the bladder. Bladder is unremarkable.

Stomach/Bowel: Stomach is within normal limits. Appendix appears
normal. No evidence of bowel wall thickening, distention, or
inflammatory changes.

Vascular/Lymphatic: Aortic atherosclerosis. No enlarged abdominal or
pelvic lymph nodes.

Reproductive: No mass or other abnormality.

Other: No abdominal wall hernia or abnormality. No abdominopelvic
ascites.

Musculoskeletal: No acute or significant osseous findings.
IMPRESSION: 1. No evidence of mass, lymphadenopathy, or specific evidence of
metastatic disease within the chest, abdomen, or pelvis.
2. Unchanged 6 mm pulmonary nodule of the dependent right lower
lobe.
3. Previously noted bilateral hydronephrosis is essentially
resolved, with mild persistent prominence of the renal pelves and
ureters, patent to the bladder.
4. Tiny nonobstructive inferior pole calculus of the left kidney.
5. Coronary artery disease.

Aortic Atherosclerosis ([FD]-[FD]).

## 2021-01-15 MED ORDER — IOHEXOL 300 MG/ML  SOLN
100.0000 mL | Freq: Once | INTRAMUSCULAR | Status: AC | PRN
  Administered 2021-01-15: 100 mL via INTRAVENOUS

## 2021-01-15 NOTE — Telephone Encounter (Signed)
Called patient to let him know Dr. Hazeline Junker message below. Patient verbalized understanding.

## 2021-01-15 NOTE — Telephone Encounter (Signed)
-----   Message from [name], MD sent at 01/15/2021  4:03 PM EST ----- Please let him know the CT scan is completely normal.  Nothing to be worried about at this time. Thanks ----- Message ----- From: Tami Lin, RN Sent: 01/15/2021   4:02 PM EST To: [name], MD  Patient had a CT scan today and has received the results in my chart. His next visit is not until 04/2021. He wants to know if there is anything from the scan that he should be concerned about. Lanelle Bal

## 2021-02-09 ENCOUNTER — Other Ambulatory Visit: Payer: Self-pay | Admitting: Oncology

## 2021-02-18 ENCOUNTER — Telehealth: Payer: Self-pay

## 2021-02-18 ENCOUNTER — Other Ambulatory Visit: Payer: Self-pay

## 2021-02-18 ENCOUNTER — Ambulatory Visit
Admission: EM | Admit: 2021-02-18 | Discharge: 2021-02-18 | Disposition: A | Discharge status: Home / Self Care | Payer: PPO | Attending: Family Medicine | Admitting: Family Medicine

## 2021-02-18 DIAGNOSIS — R109 Unspecified abdominal pain: Secondary | ICD-10-CM | POA: Diagnosis not present

## 2021-02-18 DIAGNOSIS — R197 Diarrhea, unspecified: Secondary | ICD-10-CM | POA: Diagnosis not present

## 2021-02-18 DIAGNOSIS — K529 Noninfective gastroenteritis and colitis, unspecified: Secondary | ICD-10-CM

## 2021-02-18 DIAGNOSIS — Z7689 Persons encountering health services in other specified circumstances: Secondary | ICD-10-CM | POA: Diagnosis not present

## 2021-02-18 LAB — POCT URINALYSIS DIP (MANUAL ENTRY)
Bilirubin, UA: NEGATIVE
Glucose, UA: NEGATIVE mg/dL
Ketones, POC UA: NEGATIVE mg/dL
Leukocytes, UA: NEGATIVE
Nitrite, UA: NEGATIVE
Protein Ur, POC: NEGATIVE mg/dL
Spec Grav, UA: 1.025 (ref 1.010–1.025)
Urobilinogen, UA: 0.2 E.U./dL
pH, UA: 5.5 (ref 5.0–8.0)

## 2021-02-18 NOTE — ED Triage Notes (Signed)
Pt c/o lack of appetite, n/v/d, dizziness/lightheadedness, onset last night; last emesis in the middle of the night, diarrhea this morning. Low grade fever this morning of 99.9. States he had right flank/low back pain last night, now resolved.   No dizziness this morning. Denies abdominal pain, dysuria sx, SOB, CP, cough, congestion, runny nose, sore throat, HA, ear pain. Denies exposure to anyone with similar symptoms.

## 2021-02-18 NOTE — Telephone Encounter (Signed)
Noted  

## 2021-02-18 NOTE — Telephone Encounter (Signed)
Noted. Agree with [facility] eval. plz call tomorrow for update on symptoms.

## 2021-02-18 NOTE — Discharge Instructions (Addendum)
I believe this may be viral We are checking some blood work, checking for flu, covid.  Stay hydrated. Pepto as needed.  Follow up as needed for continued or worsening symptoms

## 2021-02-18 NOTE — Telephone Encounter (Addendum)
Pts wife (DPR signed) left v/m that pt has CA and is presently on oral chemotherapy and hormone therapy. Starting on 02/17/21 pt has vomited several times, 99.9 temp;lightheaded, deep belching,and achy joints and pt having slight pain in lower back. No abd pain but pt just had diarrhea and pts mouth is dry. pts wife said pt does not drink enough water even when he is not sick. pts wife concerned this could be the flu and wants pt seen ASAP due to his being treated for prostate CA; [facility] to [facility] or [facility] in [place] for eval and possible testing. No UTI symptoms and no CP or SOB per pts wife. pts wife will update later this week by mychart or she will call to [facility]. Sending note to Dr Darnell Level.

## 2021-02-19 LAB — COMPREHENSIVE METABOLIC PANEL
ALT: 17 IU/L (ref 0–44)
AST: 23 IU/L (ref 0–40)
Albumin/Globulin Ratio: 2 (ref 1.2–2.2)
Albumin: 4.3 g/dL (ref 3.8–4.8)
Alkaline Phosphatase: 46 IU/L (ref 44–121)
BUN/Creatinine Ratio: 18 (ref 10–24)
BUN: 22 mg/dL (ref 8–27)
Bilirubin Total: 0.7 mg/dL (ref 0.0–1.2)
CO2: 17 mmol/L — ABNORMAL LOW (ref 20–29)
Calcium: 9 mg/dL (ref 8.6–10.2)
Chloride: 102 mmol/L (ref 96–106)
Creatinine, Ser: 1.25 mg/dL (ref 0.76–1.27)
Globulin, Total: 2.2 g/dL (ref 1.5–4.5)
Glucose: 108 mg/dL — ABNORMAL HIGH (ref 65–99)
Potassium: 3.8 mmol/L (ref 3.5–5.2)
Sodium: 139 mmol/L (ref 134–144)
Total Protein: 6.5 g/dL (ref 6.0–8.5)
eGFR: 62 mL/min/{1.73_m2} (ref >59)

## 2021-02-19 LAB — CBC WITH DIFFERENTIAL/PLATELET
Basophils Absolute: 0 10*3/uL (ref 0.0–0.2)
Basos: 0 %
EOS (ABSOLUTE): 0.2 10*3/uL (ref 0.0–0.4)
Eos: 3 %
Hematocrit: 39.3 % (ref 37.5–51.0)
Hemoglobin: 13.5 g/dL (ref 13.0–17.7)
Immature Grans (Abs): 0.1 10*3/uL (ref 0.0–0.1)
Immature Granulocytes: 1 %
Lymphocytes Absolute: 0.6 10*3/uL — ABNORMAL LOW (ref 0.7–3.1)
Lymphs: 9 %
MCH: 33.1 pg — ABNORMAL HIGH (ref 26.6–33.0)
MCHC: 34.4 g/dL (ref 31.5–35.7)
MCV: 96 fL (ref 79–97)
Monocytes Absolute: 0.5 10*3/uL (ref 0.1–0.9)
Monocytes: 7 %
Neutrophils Absolute: 5.4 10*3/uL (ref 1.4–7.0)
Neutrophils: 80 %
Platelets: 213 10*3/uL (ref 150–450)
RBC: 4.08 x10E6/uL — ABNORMAL LOW (ref 4.14–5.80)
RDW: 12.3 % (ref 11.6–15.4)
WBC: 6.8 10*3/uL (ref 3.4–10.8)

## 2021-02-19 LAB — COVID-19, FLU A+B NAA
Influenza A, NAA: NOT DETECTED
Influenza B, NAA: NOT DETECTED
SARS-CoV-2, NAA: NOT DETECTED

## 2021-02-19 NOTE — ED Provider Notes (Signed)
Shane [name]    CSN: [id number] Arrival date & time: 02/18/21  1135      History   Chief Complaint Chief Complaint  Patient presents with  . Abdominal Pain  . Emesis    HPI Shane [name] is a 70 y.o. male.   Pt is a 70 year old male with PMH of prostate cancer that presents with lack of appetite, n/v/d, dizziness/lightheadedness, onset last night; last emesis in the middle of the night, diarrhea this morning. Low grade fever this morning of 99.9. States he had right flank/low back pain last night, now resolved. No dizziness this morning. Denies abdominal pain, dysuria sx, SOB, CP, cough, congestion, runny nose, sore throat, HA, ear pain. Denies exposure to anyone with similar symptoms.   Abdominal Pain Associated symptoms: vomiting   Emesis Associated symptoms: abdominal pain     Past Medical History:  Diagnosis Date  . Cancer involving bladder by direct extension from prostate ([place]) 06/28/2019   Cystoscopy Shane [name]) - 2 bladder tumors, 1 on anterior prostate planned TURBT/TURP 06/2019 Biopsy - Gleason 5+4=9 prostate cancer [place] started 07/2019 planned metastatic survey  . History of chicken pox   . Lipoma of neck   . Seasonal allergies   . Syncopal episodes 2007   x 2 s/p unrevealing hospitalization without recurrence    Patient Active Problem List   Diagnosis Date Noted  . History of DVT of lower extremity 02/25/2020  . Hydronephrosis 02/13/2020  . Loss of taste 01/31/2020  . Bone metastases ([place]) 12/31/2019  . Recurrent UTI 12/31/2019  . Malignant neoplasm of prostate (Shane [name]) 08/17/2019  . Cancer involving bladder by direct extension from prostate ([name]) 06/28/2019  . HLD (hyperlipidemia) 08/02/2018  . Health maintenance examination 08/05/2017  . Anosmia 08/05/2017  . Family history of hemochromatosis 08/03/2016  . Medicare annual wellness visit, subsequent 07/29/2015  . Advanced care planning/counseling discussion 07/29/2015  . Bradycardia 07/29/2015   . History of cardiac monitoring 07/29/2015  . Syncopal episodes     Past Surgical History:  Procedure Laterality Date  . CYSTOSCOPY W/ RETROGRADES Bilateral 07/06/2019   Procedure: CYSTOSCOPY WITH RETROGRADE PYELOGRAM, BILATERAL STENT PLACEMENT;  Surgeon: [name], MD;  Location: [facility];  Service: Urology;  Laterality: Bilateral;  . EP IMPLANTABLE DEVICE N/A 02/25/2016   Procedure: Loop Recorder Removal;  Surgeon: Deboraha Sprang, MD;  Location: [facility];  Service: Cardiovascular;  Laterality: N/A;  . Internal Cardiac Monitor  2007   due to syncopal episodes - never contacted for f/u  . IR NEPHROSTOMY PLACEMENT LEFT  02/14/2020  . IR NEPHROSTOMY PLACEMENT RIGHT  02/14/2020  . LIPOMA EXCISION N/A 02/09/2016   Procedure: EXCISION NECK LIPOMA;  Surgeon: Autumn Messing III, MD;  Location: [place];  Service: General;  Laterality: N/A;  . TRANSURETHRAL RESECTION OF BLADDER TUMOR N/A 07/06/2019   Procedure: TRANSURETHRAL RESECTION OF BLADDER TUMOR (TURBT);  Surgeon: [name], MD;  Location: [facility];  Service: Urology;  Laterality: N/A;       Home Medications    Prior to Admission medications   Medication Sig Start Date End Date Taking? Authorizing Provider  abiraterone acetate (ZYTIGA) 250 MG tablet Take 4 tablets (1,000 mg total) by mouth daily. Take on an empty stomach 1 hour before or 2 hours after a meal 08/05/20  Yes Shadad, Mathis Dad, MD  predniSONE (DELTASONE) 5 MG tablet TAKE ONE TABLET BY MOUTH DAILY WITH BREAKFAST 02/09/21  Yes [name], MD  meloxicam [facility])  15 MG tablet Take 1 tablet (15 mg total) by mouth daily as needed for pain. 02/15/20   [name], MD  MYRBETRIQ 50 MG TB24 tablet Take 50 mg by mouth daily. 11/19/19 02/18/21  [provider]  venlafaxine (EFFEXOR) 25 MG tablet Take 25 mg by mouth daily. 10/17/19 02/18/21  [provider]    Family History Family History  Problem Relation Age of Onset  .  Hyperlipidemia Mother   . Cancer Mother        peritoneal cancer  . Hemochromatosis Father        s/p liver transplant - pt screened negative 07/2015  . Hemochromatosis Paternal Grandmother   . Hemochromatosis Paternal Uncle   . Stroke Neg Hx   . Diabetes Neg Hx   . CAD Neg Hx     Social History Social History   Tobacco Use  . Smoking status: Never Smoker  . Smokeless tobacco: Never Used  Vaping Use  . Vaping Use: Never used  Substance Use Topics  . Alcohol use: Yes    Alcohol/week: 0.0 standard drinks    Comment: 1 beer most evenings  . Drug use: No     Allergies   Bactrim [sulfamethoxazole-trimethoprim], Hydrocodone, Percocet [oxycodone-acetaminophen], Vancomycin, Penicillins, and Zithromax [azithromycin]   Review of Systems Review of Systems  Gastrointestinal: Positive for abdominal pain and vomiting.     Physical Exam Triage Vital Signs ED Triage Vitals  Enc Vitals Group     BP 02/18/21 1146 120/68     Pulse Rate 02/18/21 1146 66     Resp 02/18/21 1146 18     Temp 02/18/21 1146 99 F (37.2 C)     Temp Source 02/18/21 1146 Oral     SpO2 02/18/21 1146 96 %     Weight --      Height --      Head Circumference --      Peak Flow --      Pain Score 02/18/21 1154 0     Pain Loc --      Pain Edu? --      Excl. in [place]? --    No data found.  Updated Vital Signs BP 120/68 (BP Location: Left Arm)   Pulse 66   Temp 99 F (37.2 C) (Oral)   Resp 18   SpO2 96%   Visual Acuity Right Eye Distance:   Left Eye Distance:   Bilateral Distance:    Right Eye Near:   Left Eye Near:    Bilateral Near:     Physical Exam Vitals and nursing note reviewed.  Constitutional:      General: He is not in acute distress.    Appearance: Normal appearance. He is not ill-appearing, toxic-appearing or diaphoretic.  HENT:     Head: Normocephalic and atraumatic.     Right Ear: Tympanic membrane and ear canal normal.     Left Ear: Tympanic membrane and ear canal normal.      Nose: Nose normal.     Mouth/Throat:     Pharynx: Oropharynx is clear.  Eyes:     Conjunctiva/sclera: Conjunctivae normal.  Cardiovascular:     Rate and Rhythm: Normal rate.  Pulmonary:     Effort: Pulmonary effort is normal.     Breath sounds: Normal breath sounds.  Abdominal:     Palpations: Abdomen is soft.     Tenderness: There is no abdominal tenderness.  Musculoskeletal:        General: Normal range  of motion.     Cervical back: Normal range of motion.  Skin:    General: Skin is warm and dry.  Neurological:     General: No focal deficit present.     Mental Status: He is alert.  Psychiatric:        Mood and Affect: Mood normal.      UC Treatments / Results  Labs (all labs ordered are listed, but only abnormal results are displayed) Labs Reviewed  POCT URINALYSIS DIP (MANUAL ENTRY) - Abnormal; Notable for the following components:      Result Value   Blood, UA trace-lysed (*)    All other components within normal limits  COVID-19, FLU A+B NAA  CBC WITH DIFFERENTIAL/PLATELET  COMPREHENSIVE METABOLIC PANEL    EKG   Radiology No results found.  Procedures Procedures (including critical care time)  Medications Ordered in UC Medications - No data to display  Initial Impression / Assessment and Plan / UC Course  I have reviewed the triage vital signs and the nursing notes.  Pertinent labs & imaging results that were available during my care of the patient were reviewed by me and considered in my medical decision making (see chart for details).     Gastroenteritis No concerns on exam, non toxic or ill appearing, VSS Recommended stay hydrated, rest.  CBC, CMP and flu test pending.  Pepto as needed since this is helping Follow up as needed for continued or worsening symptoms  Final Clinical Impressions(s) / UC Diagnoses   Final diagnoses:  Noninfectious gastroenteritis, unspecified type  Diarrhea, unspecified type     Discharge Instructions      I believe this may be viral We are checking some blood work, checking for flu, covid.  Stay hydrated. Pepto as needed.  Follow up as needed for continued or worsening symptoms      ED Prescriptions    None     PDMP not reviewed this encounter.   Orvan July, NP 02/19/21 [phone]

## 2021-02-20 NOTE — Telephone Encounter (Signed)
Spoke with pt asking for an update on sxs.  States he's doing much better.  Still a little tired but no more vomiting or excessive gas and belching.  Says he's still waiting for flu and COVID test results.

## 2021-04-25 ENCOUNTER — Other Ambulatory Visit: Payer: Self-pay | Admitting: Family Medicine

## 2021-04-25 DIAGNOSIS — E7800 Pure hypercholesterolemia, unspecified: Secondary | ICD-10-CM

## 2021-04-25 DIAGNOSIS — C61 Malignant neoplasm of prostate: Secondary | ICD-10-CM

## 2021-04-25 DIAGNOSIS — Z8349 Family history of other endocrine, nutritional and metabolic diseases: Secondary | ICD-10-CM

## 2021-04-27 ENCOUNTER — Other Ambulatory Visit: Payer: Self-pay

## 2021-04-27 ENCOUNTER — Other Ambulatory Visit (INDEPENDENT_AMBULATORY_CARE_PROVIDER_SITE_OTHER): Payer: PPO

## 2021-04-27 ENCOUNTER — Ambulatory Visit: Payer: PPO

## 2021-04-27 DIAGNOSIS — E7800 Pure hypercholesterolemia, unspecified: Secondary | ICD-10-CM | POA: Diagnosis not present

## 2021-04-27 DIAGNOSIS — C61 Malignant neoplasm of prostate: Secondary | ICD-10-CM

## 2021-04-27 DIAGNOSIS — C7911 Secondary malignant neoplasm of bladder: Secondary | ICD-10-CM

## 2021-04-27 LAB — COMPREHENSIVE METABOLIC PANEL
ALT: 20 U/L (ref 0–53)
AST: 24 U/L (ref 0–37)
Albumin: 4.2 g/dL (ref 3.5–5.2)
Alkaline Phosphatase: 49 U/L (ref 39–117)
BUN: 22 mg/dL (ref 6–23)
CO2: 26 mEq/L (ref 19–32)
Calcium: 9.8 mg/dL (ref 8.4–10.5)
Chloride: 106 mEq/L (ref 96–112)
Creatinine, Ser: 1.23 mg/dL (ref 0.40–1.50)
GFR: 59.39 mL/min — ABNORMAL LOW (ref >60.00)
Glucose, Bld: 93 mg/dL (ref 70–99)
Potassium: 4.1 mEq/L (ref 3.5–5.1)
Sodium: 141 mEq/L (ref 135–145)
Total Bilirubin: 0.7 mg/dL (ref 0.2–1.2)
Total Protein: 6.9 g/dL (ref 6.0–8.3)

## 2021-04-27 LAB — CBC WITH DIFFERENTIAL/PLATELET
Basophils Absolute: 0 10*3/uL (ref 0.0–0.1)
Basophils Relative: 0.6 % (ref 0.0–3.0)
Eosinophils Absolute: 0.4 10*3/uL (ref 0.0–0.7)
Eosinophils Relative: 7.2 % — ABNORMAL HIGH (ref 0.0–5.0)
HCT: 39.7 % (ref 39.0–52.0)
Hemoglobin: 13.7 g/dL (ref 13.0–17.0)
Lymphocytes Relative: 23.6 % (ref 12.0–46.0)
Lymphs Abs: 1.2 10*3/uL (ref 0.7–4.0)
MCHC: 34.6 g/dL (ref 30.0–36.0)
MCV: 94.9 fl (ref 78.0–100.0)
Monocytes Absolute: 0.5 10*3/uL (ref 0.1–1.0)
Monocytes Relative: 10.2 % (ref 3.0–12.0)
Neutro Abs: 3 10*3/uL (ref 1.4–7.7)
Neutrophils Relative %: 58.4 % (ref 43.0–77.0)
Platelets: 205 10*3/uL (ref 150.0–400.0)
RBC: 4.18 Mil/uL — ABNORMAL LOW (ref 4.22–5.81)
RDW: 13 % (ref 11.5–15.5)
WBC: 5.1 10*3/uL (ref 4.0–10.5)

## 2021-04-27 LAB — LIPID PANEL
Cholesterol: 196 mg/dL (ref 0–200)
HDL: 47 mg/dL (ref >39.00)
LDL Cholesterol: 117 mg/dL — ABNORMAL HIGH (ref 0–99)
NonHDL: 149.32
Total CHOL/HDL Ratio: 4
Triglycerides: 163 mg/dL — ABNORMAL HIGH (ref 0.0–149.0)
VLDL: 32.6 mg/dL (ref 0.0–40.0)

## 2021-05-01 ENCOUNTER — Other Ambulatory Visit: Payer: Self-pay

## 2021-05-01 ENCOUNTER — Inpatient Hospital Stay: Payer: PPO

## 2021-05-01 ENCOUNTER — Inpatient Hospital Stay: Payer: PPO | Attending: Oncology

## 2021-05-01 ENCOUNTER — Inpatient Hospital Stay: Payer: PPO | Admitting: Oncology

## 2021-05-01 VITALS — BP 162/81 | HR 60 | Temp 98.2°F | Resp 19 | Wt 193.6 lb

## 2021-05-01 DIAGNOSIS — C61 Malignant neoplasm of prostate: Secondary | ICD-10-CM

## 2021-05-01 DIAGNOSIS — C7951 Secondary malignant neoplasm of bone: Secondary | ICD-10-CM | POA: Diagnosis not present

## 2021-05-01 DIAGNOSIS — Z7952 Long term (current) use of systemic steroids: Secondary | ICD-10-CM | POA: Insufficient documentation

## 2021-05-01 DIAGNOSIS — I10 Essential (primary) hypertension: Secondary | ICD-10-CM | POA: Diagnosis not present

## 2021-05-01 DIAGNOSIS — Z79899 Other long term (current) drug therapy: Secondary | ICD-10-CM | POA: Insufficient documentation

## 2021-05-01 LAB — CMP (CANCER CENTER ONLY)
ALT: 26 U/L (ref 0–44)
AST: 31 U/L (ref 15–41)
Albumin: 3.9 g/dL (ref 3.5–5.0)
Alkaline Phosphatase: 60 U/L (ref 38–126)
Anion gap: 11 (ref 5–15)
BUN: 18 mg/dL (ref 8–23)
CO2: 24 mmol/L (ref 22–32)
Calcium: 9.7 mg/dL (ref 8.9–10.3)
Chloride: 105 mmol/L (ref 98–111)
Creatinine: 1.18 mg/dL (ref 0.61–1.24)
GFR, Estimated: >60 mL/min (ref >60)
Glucose, Bld: 102 mg/dL — ABNORMAL HIGH (ref 70–99)
Potassium: 3.9 mmol/L (ref 3.5–5.1)
Sodium: 140 mmol/L (ref 135–145)
Total Bilirubin: 0.6 mg/dL (ref 0.3–1.2)
Total Protein: 7.2 g/dL (ref 6.5–8.1)

## 2021-05-01 LAB — CBC WITH DIFFERENTIAL (CANCER CENTER ONLY)
Abs Immature Granulocytes: 0.05 10*3/uL (ref 0.00–0.07)
Basophils Absolute: 0 10*3/uL (ref 0.0–0.1)
Basophils Relative: 1 %
Eosinophils Absolute: 0.4 10*3/uL (ref 0.0–0.5)
Eosinophils Relative: 9 %
HCT: 37.6 % — ABNORMAL LOW (ref 39.0–52.0)
Hemoglobin: 13.2 g/dL (ref 13.0–17.0)
Immature Granulocytes: 1 %
Lymphocytes Relative: 18 %
Lymphs Abs: 0.8 10*3/uL (ref 0.7–4.0)
MCH: 32.6 pg (ref 26.0–34.0)
MCHC: 35.1 g/dL (ref 30.0–36.0)
MCV: 92.8 fL (ref 80.0–100.0)
Monocytes Absolute: 0.5 10*3/uL (ref 0.1–1.0)
Monocytes Relative: 12 %
Neutro Abs: 2.5 10*3/uL (ref 1.7–7.7)
Neutrophils Relative %: 59 %
Platelet Count: 162 10*3/uL (ref 150–400)
RBC: 4.05 MIL/uL — ABNORMAL LOW (ref 4.22–5.81)
RDW: 12 % (ref 11.5–15.5)
WBC Count: 4.2 10*3/uL (ref 4.0–10.5)
nRBC: 0 % (ref 0.0–0.2)

## 2021-05-01 MED ORDER — LEUPROLIDE ACETATE (4 MONTH) 30 MG ~~LOC~~ KIT
30.0000 mg | PACK | Freq: Once | SUBCUTANEOUS | Status: AC
  Administered 2021-05-01: 30 mg via SUBCUTANEOUS

## 2021-05-01 MED ORDER — LEUPROLIDE ACETATE (4 MONTH) 30 MG ~~LOC~~ KIT
PACK | SUBCUTANEOUS | Status: AC
  Filled 2021-05-01: qty 30

## 2021-05-01 NOTE — Progress Notes (Signed)
Hematology and Oncology Follow Up Visit  Shane [name] [id number] 08/26/1951 70 y.o. 05/01/2021 8:44 AM Shane [name], MDGutierrez, Shane Hatchet, MD   Principle Diagnosis: 70 year old man with advanced prostate cancer with lymphadenopathy and bone disease diagnosed in July 2020.  He has castration-sensitive  after presenting with Gleason score of 5+4 = 9.    Prior Therapy:  He is status post TURBT and stent placement bilaterally the pathology showed Gleason score 5+4 = 9 prostate cancer invading into the bladder.  Current therapy:   Eligard 30 mg every 4 months.   He is scheduled to receive Eligard today.  Zytiga 1000 mg daily with prednisone 5 mg started in September 2020.  Interim History: Shane [name] returns today for repeat evaluation.  Since the last visit, he reports no major changes in his health.  He has tolerated Zytiga without any major complaints.  He does report some occasional hot flashes and fatigue but manageable overall.  Denies any chest pain, shortness of breath or pathological fractures.  He denies any hospitalizations or illnesses.  His performance status and quality of life are unchanged.  Continues to be active and enjoying reasonable quality of life.        Medications: Updated on review. Current Outpatient Medications  Medication Sig Dispense Refill  . abiraterone acetate (ZYTIGA) 250 MG tablet Take 4 tablets (1,000 mg total) by mouth daily. Take on an empty stomach 1 hour before or 2 hours after a meal 120 tablet 10  . meloxicam (MOBIC) 15 MG tablet Take 1 tablet (15 mg total) by mouth daily as needed for pain.    . predniSONE (DELTASONE) 5 MG tablet TAKE ONE TABLET BY MOUTH DAILY WITH BREAKFAST 90 tablet 0   No current facility-administered medications for this visit.     Allergies:  Allergies  Allergen Reactions  . Bactrim [Sulfamethoxazole-Trimethoprim] Hives    Red rash, hives  . Hydrocodone   . Percocet [Oxycodone-Acetaminophen] Other (See  Comments)    TURNS RED FROM CHEST UP, LOOKS LIKE SUNBURN AND STARTS TO [place] BADLY  . Vancomycin   . Penicillins Swelling and Rash    Did it involve swelling of the face/tongue/throat, SOB, or low BP? No Did it involve sudden or severe rash/hives, skin peeling, or any reaction on the inside of your mouth or nose? No Did you need to seek medical attention at a hospital or doctor's office? No When did it last happen?50 years ago If all above answers are "NO", may proceed with cephalosporin use.   Marland Kitchen Zithromax [Azithromycin] Rash        Physical Exam:     Blood pressure (!) 162/81, pulse 60, temperature 98.2 F (36.8 C), temperature source Tympanic, resp. rate 19, weight 193 lb 9.6 oz (87.8 kg), SpO2 99 %.    ECOG: 0     General appearance: Comfortable appearing without any discomfort Head: Normocephalic without any trauma Oropharynx: Mucous membranes are moist and pink without any thrush or ulcers. Eyes: Pupils are equal and round reactive to light. Lymph nodes: No cervical, supraclavicular, inguinal or axillary lymphadenopathy.   Heart:regular rate and rhythm.  S1 and S2 without leg edema. Lung: Clear without any rhonchi or wheezes.  No dullness to percussion. Abdomin: Soft, nontender, nondistended with good bowel sounds.  No hepatosplenomegaly. Musculoskeletal: No joint deformity or effusion.  Full range of motion noted. Neurological: No deficits noted on motor, sensory and deep tendon reflex exam. Skin: No petechial rash or dryness.  Appeared moist.  Lab Results: Lab Results  Component Value Date   WBC 5.1 04/27/2021   HGB 13.7 04/27/2021   HCT 39.7 04/27/2021   MCV 94.9 04/27/2021   PLT 205.0 04/27/2021     Chemistry      Component Value Date/Time   NA 141 04/27/2021 0729   NA 139 02/18/2021 1236   K 4.1 04/27/2021 0729   CL 106 04/27/2021 0729   CO2 26 04/27/2021 0729   BUN 22 04/27/2021 0729   BUN 22 02/18/2021 1236   CREATININE  1.23 04/27/2021 0729   CREATININE 1.28 (H) 01/02/2021 1311   GLU 113 01/08/2020 0000      Component Value Date/Time   CALCIUM 9.8 04/27/2021 0729   ALKPHOS 49 04/27/2021 0729   AST 24 04/27/2021 0729   AST 24 01/02/2021 1311   ALT 20 04/27/2021 0729   ALT 20 01/02/2021 1311   BILITOT 0.7 04/27/2021 0729   BILITOT 0.7 02/18/2021 1236   BILITOT 0.6 01/02/2021 1311        Results for Shane, [name] (MRN [id number]) as of 05/01/2021 08:45  Ref. Range 01/02/2021 13:11  Prostate Specific Ag, Serum Latest Ref Range: 0.0 - 4.0 ng/mL <0.1     IMPRESSION: 1. No evidence of mass, lymphadenopathy, or specific evidence of metastatic disease within the chest, abdomen, or pelvis. 2. Unchanged 6 mm pulmonary nodule of the dependent right lower lobe. 3. Previously noted bilateral hydronephrosis is essentially resolved, with mild persistent prominence of the renal pelves and ureters, patent to the bladder. 4. Tiny nonobstructive inferior pole calculus of the left kidney. 5. Coronary artery disease.  Aortic Atherosclerosis (ICD10-I70.0).    Impression and Plan:   70 year old with:  1.    Advanced prostate cancer with lymphadenopathy diagnosed in July 2020.  He has castration-sensitive disease at this time.  He continues to be on Zytiga with excellent PSA response and no evidence of disease relapse.  Risks and benefits of continuing this treatment were discussed at this time.  Complications including hypertension, adrenal insufficiency among others were reviewed.  Alternative treatment options such as systemic chemotherapy will be deferred unless he has progression of disease in the future.  CT scan obtained in February 2022 showed excellent response to therapy with resolution of any metastatic disease.  He is agreeable to continue at this time.   2.    Bone directed therapy: He is currently on calcium and vitamin D supplements which I recommended continuing for the time  being.  3.  Bilateral hydronephrosis: Resolved based on imaging studies.  4.  Androgen deprivation: He will receive Eligard today and repeated in 4 months.  Complications including weight gain, hot flashes were reviewed.  5.  Hypertension: His blood pressure is mildly elevated today.  I asked him to monitor his blood pressure at home and record these findings.  He might require blood pressure medication if this persists.  6.  Follow-up: In 4 months for repeat evaluation.  30 minutes were dedicated to this visit.  Time spent on reviewing laboratory data, disease status update, reviewing imaging studies and future plan of care review.  Zola Button, MD 5/27/20228:44 AM

## 2021-05-02 LAB — PROSTATE-SPECIFIC AG, SERUM (LABCORP): Prostate Specific Ag, Serum: <0.1 ng/mL (ref 0.0–4.0)

## 2021-05-05 ENCOUNTER — Other Ambulatory Visit: Payer: Self-pay | Admitting: Oncology

## 2021-05-05 ENCOUNTER — Telehealth: Payer: Self-pay | Admitting: *Deleted

## 2021-05-05 NOTE — Telephone Encounter (Signed)
PC to patient, no answer, left VM - informed patient his PSA is <0.1   Informed patient he may call office with any questions/concerns [phone]

## 2021-05-05 NOTE — Telephone Encounter (Signed)
-----   Message from [name], MD sent at 05/05/2021  8:21 AM EDT ----- Please let him know his PSA is still low

## 2021-05-06 ENCOUNTER — Telehealth: Payer: Self-pay | Admitting: *Deleted

## 2021-05-06 ENCOUNTER — Ambulatory Visit (INDEPENDENT_AMBULATORY_CARE_PROVIDER_SITE_OTHER): Payer: PPO | Admitting: Family Medicine

## 2021-05-06 ENCOUNTER — Encounter: Payer: Self-pay | Admitting: Family Medicine

## 2021-05-06 ENCOUNTER — Telehealth: Payer: Self-pay | Admitting: Family Medicine

## 2021-05-06 ENCOUNTER — Other Ambulatory Visit: Payer: Self-pay

## 2021-05-06 VITALS — BP 128/70 | HR 63 | Temp 97.6°F | Ht 69.75 in | Wt 195.0 lb

## 2021-05-06 DIAGNOSIS — Z1211 Encounter for screening for malignant neoplasm of colon: Secondary | ICD-10-CM

## 2021-05-06 DIAGNOSIS — Z7189 Other specified counseling: Secondary | ICD-10-CM

## 2021-05-06 DIAGNOSIS — I700 Atherosclerosis of aorta: Secondary | ICD-10-CM

## 2021-05-06 DIAGNOSIS — C7951 Secondary malignant neoplasm of bone: Secondary | ICD-10-CM

## 2021-05-06 DIAGNOSIS — Z0000 Encounter for general adult medical examination without abnormal findings: Secondary | ICD-10-CM | POA: Diagnosis not present

## 2021-05-06 DIAGNOSIS — C61 Malignant neoplasm of prostate: Secondary | ICD-10-CM

## 2021-05-06 DIAGNOSIS — I2510 Atherosclerotic heart disease of native coronary artery without angina pectoris: Secondary | ICD-10-CM | POA: Insufficient documentation

## 2021-05-06 DIAGNOSIS — E7800 Pure hypercholesterolemia, unspecified: Secondary | ICD-10-CM

## 2021-05-06 NOTE — Assessment & Plan Note (Addendum)
Chronic, stable period off cholesterol medicine. Will stay off at this time The 10-year ASCVD risk score Mikey Bussing DC Brooke Bonito., et al., 2013) is: 18.4%   Values used to calculate the score:     Age: 70 years     Sex: Male     Is Non-Hispanic African American: No     Diabetic: No     Tobacco smoker: No     Systolic Blood Pressure: 384 mmHg     Is BP treated: No     HDL Cholesterol: 47 mg/dL     Total Cholesterol: 196 mg/dL

## 2021-05-06 NOTE — Assessment & Plan Note (Signed)

## 2021-05-06 NOTE — Assessment & Plan Note (Addendum)
No evidence of active disease of bones or lymph nodes on latest CT scan.

## 2021-05-06 NOTE — Telephone Encounter (Signed)
Mrs. Shane [name] called in and wanted to report his covid boosters.  1st- 09/25/20 2nd-04/20/21

## 2021-05-06 NOTE — Progress Notes (Signed)
Patient ID: Shane [name], male    DOB: 1951-05-27, 70 y.o.   MRN: [id number]  This visit was conducted in person.  BP 128/70   Pulse 63   Temp 97.6 F (36.4 C) (Temporal)   Ht 5' 9.75" (1.772 m)   Wt 195 lb (88.5 kg)   SpO2 98%   BMI 28.18 kg/m    CC: CPE  Subjective:   HPI: Shane [name] is a 70 y.o. male presenting on 05/06/2021 for Medicare Wellness   Did not see health advisor this year.    Hearing Screening   125Hz  250Hz  500Hz  1000Hz  2000Hz  3000Hz  4000Hz  6000Hz  8000Hz   Right ear:   20 25 20   40    Left ear:   20 20 20   0      Visual Acuity Screening   Right eye Left eye Both eyes  Without correction:     With correction: 20/25 20/25 20/20     Flowsheet Row Office Visit from 05/06/2021 in [place] at [facility]  PHQ-2 Total Score 0      Fall Risk  05/06/2021 04/24/2020 04/08/2020 01/31/2020 01/01/2020  Falls in the past year? 0 0 0 0 0  Number falls in past yr: - 0 - - 0  Injury with Fall? - 0 - - -  Risk for fall due to : - Medication side effect - - -  Follow up - Falls evaluation completed;Falls prevention discussed Falls evaluation completed Falls evaluation completed Falls evaluation completed   Metastatic prostate cancer to bladder, LN, and bone dx (03/980) complicated by PE s/p xarelto and pseudomonas UTI resistant to treatment s/p initial stents then bilateral nephrostomy tubes (removed 2021) s/p IV cefepime course via PICC line. Saw urology [name]) onc Alen Blew) and ID Megan Salon). Continues zytiga and lupron injection Q6 mo, tolerating well except for hot flashes. Planned androgen deprivation indefinitely.    Recently saw onc Alen Blew), note reviewed - continues Eligard 30mg  Q4 months with Zytiga 1000mg  daily with prednisone 5mg  daily since 08/2019.   Preventative: Colonoscopy remotely normal. Cologuard normal 2017, positive 05/2020 (while actively inflamed hemorrhoids), subsequent iFOB returned negative 05/2020. Agrees to GI referral for colonoscopy.   Prostate cancer - see above.  Lung cancer screening - not eligible Flu shot - declines  COVID vaccine Pfizer 12/2019, 01/2020, boosters x2 - will call us with dates Prevnar2017, pneumovax 07/2017 Tdap 01/2017 Shingrix - 10/2019, 03/2020 Advanced planning - does not have this set up. Planning on setting up, to see lawyer. Wife would be HCPOA. Packet previously provided  Seat belt use discussed  Sunscreen use discussed, no changing moles on skin. Not a smoker.  Alcohol - 1beer/day Dentist yearly Eye exam yearly Bowel - no constipation Bladder - nocturia x2, no incontinence. Doing well on myrbetriq   Lives with wife Grown children Occ: retired Engineer, structural, Musician Edu: [employer] Activity: stays activeworkingoutdoors  Diet: some water, fruits/vegetables daily     Relevant past medical, surgical, family and social history reviewed and updated as indicated. Interim medical history since our last visit reviewed. Allergies and medications reviewed and updated. Outpatient Medications Prior to Visit  Medication Sig Dispense Refill  . abiraterone acetate (ZYTIGA) 250 MG tablet Take 4 tablets (1,000 mg total) by mouth daily. Take on an empty stomach 1 hour before or 2 hours after a meal 120 tablet 10  . Calcium-Magnesium-Vitamin D (CITRACAL SLOW RELEASE PO) Take 2 tablets by mouth daily.    . Mirabegron (MYRBETRIQ PO) Take  by mouth daily.    . predniSONE (DELTASONE) 5 MG tablet TAKE ONE TABLET BY MOUTH DAILY WITH BREAKFAST 90 tablet 0  . meloxicam (MOBIC) 15 MG tablet Take 1 tablet (15 mg total) by mouth daily as needed for pain.    Marland Kitchen venlafaxine (EFFEXOR) 25 MG tablet Take 50 mg by mouth 2 (two) times daily. Takes 1/2 tablet twice a day    . venlafaxine (EFFEXOR) 25 MG tablet Take 1 tablet (25 mg total) by mouth 2 (two) times daily.     No facility-administered medications prior to visit.     Per HPI unless specifically indicated in ROS section below Review  of Systems  Constitutional: Negative for activity change, appetite change, chills, fatigue, fever and unexpected weight change.  HENT: Negative for hearing loss.   Eyes: Negative for visual disturbance.  Respiratory: Positive for cough (?allergies). Negative for chest tightness, shortness of breath and wheezing.   Cardiovascular: Negative for chest pain, palpitations and leg swelling.  Gastrointestinal: Negative for abdominal distention, abdominal pain, blood in stool, constipation, diarrhea, nausea and vomiting.  Genitourinary: Negative for difficulty urinating and hematuria.  Musculoskeletal: Negative for arthralgias, myalgias and neck pain.  Skin: Negative for rash.  Neurological: Negative for dizziness, seizures, syncope and headaches.  Hematological: Negative for adenopathy. Bruises/bleeds easily.  Psychiatric/Behavioral: Negative for dysphoric mood. The patient is not nervous/anxious.    Objective:  BP 128/70   Pulse 63   Temp 97.6 F (36.4 C) (Temporal)   Ht 5' 9.75" (1.772 m)   Wt 195 lb (88.5 kg)   SpO2 98%   BMI 28.18 kg/m   Wt Readings from Last 3 Encounters:  05/06/21 195 lb (88.5 kg)  05/01/21 193 lb 9.6 oz (87.8 kg)  01/02/21 198 lb 8 oz (90 kg)      Physical Exam Vitals and nursing note reviewed.  Constitutional:      General: He is not in acute distress.    Appearance: Normal appearance. He is well-developed. He is not ill-appearing.  HENT:     Head: Normocephalic and atraumatic.     Right Ear: Hearing, tympanic membrane, ear canal and external ear normal.     Left Ear: Hearing, tympanic membrane, ear canal and external ear normal.  Eyes:     General: No scleral icterus.    Extraocular Movements: Extraocular movements intact.     Conjunctiva/sclera: Conjunctivae normal.     Pupils: Pupils are equal, round, and reactive to light.  Neck:     Thyroid: No thyroid mass or thyromegaly.     Vascular: No carotid bruit.  Cardiovascular:     Rate and Rhythm:  Normal rate and regular rhythm.     Pulses: Normal pulses.          Radial pulses are 2+ on the right side and 2+ on the left side.     Heart sounds: Normal heart sounds. No murmur heard.   Pulmonary:     Effort: Pulmonary effort is normal. No respiratory distress.     Breath sounds: Normal breath sounds. No wheezing, rhonchi or rales.  Abdominal:     General: Bowel sounds are normal. There is no distension.     Palpations: Abdomen is soft. There is no mass.     Tenderness: There is no abdominal tenderness. There is no guarding or rebound.     Hernia: No hernia is present.  Musculoskeletal:        General: Normal range of motion.     Cervical  back: Normal range of motion and neck supple.     Right lower leg: No edema.     Left lower leg: No edema.  Lymphadenopathy:     Cervical: No cervical adenopathy.  Skin:    General: Skin is warm and dry.     Findings: No rash.  Neurological:     General: No focal deficit present.     Mental Status: He is alert and oriented to person, place, and time.     Comments:  CN grossly intact, station and gait intact Recall 3/3  Calculation 5/5 DLROW  Psychiatric:        Mood and Affect: Mood normal.        Behavior: Behavior normal.        Thought Content: Thought content normal.        Judgment: Judgment normal.       Results for orders placed or performed in visit on 05/01/21  Prostate-Specific AG, Serum  Result Value Ref Range   Prostate Specific Ag, Serum <0.1 0.0 - 4.0 ng/mL  CBC with Differential (Cancer Center Only)  Result Value Ref Range   WBC Count 4.2 4.0 - 10.5 K/uL   RBC 4.05 (L) 4.22 - 5.81 MIL/uL   Hemoglobin 13.2 13.0 - 17.0 g/dL   HCT 37.6 (L) 39.0 - 52.0 %   MCV 92.8 80.0 - 100.0 fL   MCH 32.6 26.0 - 34.0 pg   MCHC 35.1 30.0 - 36.0 g/dL   RDW 12.0 11.5 - 15.5 %   Platelet Count 162 150 - 400 K/uL   nRBC 0.0 0.0 - 0.2 %   Neutrophils Relative % 59 %   Neutro Abs 2.5 1.7 - 7.7 K/uL   Lymphocytes Relative 18 %    Lymphs Abs 0.8 0.7 - 4.0 K/uL   Monocytes Relative 12 %   Monocytes Absolute 0.5 0.1 - 1.0 K/uL   Eosinophils Relative 9 %   Eosinophils Absolute 0.4 0.0 - 0.5 K/uL   Basophils Relative 1 %   Basophils Absolute 0.0 0.0 - 0.1 K/uL   Immature Granulocytes 1 %   Abs Immature Granulocytes 0.05 0.00 - 0.07 K/uL  CMP (Cancer Center only)  Result Value Ref Range   Sodium 140 135 - 145 mmol/L   Potassium 3.9 3.5 - 5.1 mmol/L   Chloride 105 98 - 111 mmol/L   CO2 24 22 - 32 mmol/L   Glucose, Bld 102 (H) 70 - 99 mg/dL   BUN 18 8 - 23 mg/dL   Creatinine 1.18 0.61 - 1.24 mg/dL   Calcium 9.7 8.9 - 10.3 mg/dL   Total Protein 7.2 6.5 - 8.1 g/dL   Albumin 3.9 3.5 - 5.0 g/dL   AST 31 15 - 41 U/L   ALT 26 0 - 44 U/L   Alkaline Phosphatase 60 38 - 126 U/L   Total Bilirubin 0.6 0.3 - 1.2 mg/dL   GFR, Estimated >60 >60 mL/min   Anion gap 11 5 - 15   Assessment & Plan:  This visit occurred during the SARS-CoV-2 public health emergency.  Safety protocols were in place, including screening questions prior to the visit, additional usage of staff PPE, and extensive cleaning of exam room while observing appropriate contact time as indicated for disinfecting solutions.   Problem List Items Addressed This Visit    Medicare annual wellness visit, subsequent - Primary    I have personally reviewed the Medicare Annual Wellness questionnaire and have noted 1. The patient's medical and  social history 2. Their use of alcohol, tobacco or illicit drugs 3. Their current medications and supplements 4. The patient's functional ability including ADL's, fall risks, home safety risks and hearing or visual impairment. Cognitive function has been assessed and addressed as indicated.  5. Diet and physical activity 6. Evidence for depression or mood disorders The patients weight, height, BMI have been recorded in the chart. I have made referrals, counseling and provided education to the patient based on review of the above  and I have provided the pt with a written personalized care plan for preventive services. Provider list updated.. See scanned questionairre as needed for further documentation. Reviewed preventative protocols and updated unless pt declined.       Advanced care planning/counseling discussion    Advanced planning - does not have this set up. Planning on setting up, to see lawyer. Wife would be HCPOA. Packet previously provided       Health maintenance examination    Preventative protocols reviewed and updated unless pt declined. Discussed healthy diet and lifestyle.  Discussed colon cancer screening - h/o positive Cologuard at a time hemorrhoids were active, subsequent iFOB last year returned normal. Reviewed colonoscopy vs continued stool kits - will refer to GI to discuss possible colonoscopy.       HLD (hyperlipidemia)    Chronic, stable period off cholesterol medicine. Will stay off at this time The 10-year ASCVD risk score Mikey Bussing DC Brooke Bonito., et al., 2013) is: 18.4%   Values used to calculate the score:     Age: 30 years     Sex: Male     Is Non-Hispanic African American: No     Diabetic: No     Tobacco smoker: No     Systolic Blood Pressure: 585 mmHg     Is BP treated: No     HDL Cholesterol: 47 mg/dL     Total Cholesterol: 196 mg/dL       Cancer involving bladder by direct extension from prostate [facility])    Appreciate onc care.  Very stable period on daily zytiga.       Malignant neoplasm of prostate ([place])    Stable period on zytiga with latest PSA >0.1 - continues to see onc regularly.       Bone metastases ([facility])    No evidence of active disease of bones or lymph nodes on latest CT scan.       Atherosclerosis of aorta ([place])    Noted on CT scan - consider adding statin.       CAD (coronary artery disease)    Incidentally noted on recent imaging study. Consider updated EKG and statin discussion at future visit. No fmhx CAD/CVA.        Other Visit Diagnoses    Special  screening for malignant neoplasms, colon       Relevant Orders   Ambulatory referral to Gastroenterology       No orders of the defined types were placed in this encounter.  Orders Placed This Encounter  Procedures  . Ambulatory referral to Gastroenterology    Referral Priority:   Routine    Referral Type:   Consultation    Referral Reason:   Specialty Services Required    Number of Visits Requested:   1    Patient instructions; You are doing well today Return as needed or in 1 year for next physical/wellness visit.  We will refer you to GI to discuss colonoscopy.  Keep working on advanced directives  Follow up plan: Return in about 1 year (around 05/06/2022) for annual exam, prior fasting for blood work, medicare wellness visit.  [name], MD

## 2021-05-06 NOTE — Telephone Encounter (Signed)
Mr Pascucci states PCP recommended a colonoscopy. He wants to make sure Dr Alen Blew is OK with him having the procedure

## 2021-05-06 NOTE — Assessment & Plan Note (Signed)
Advanced planning - does not have this set up. Planning on setting up, to see lawyer. Wife would be HCPOA. Packet previously provided

## 2021-05-06 NOTE — Telephone Encounter (Signed)
Noted. Updated pt's chart.  

## 2021-05-06 NOTE — Telephone Encounter (Signed)
No objections

## 2021-05-06 NOTE — Assessment & Plan Note (Signed)
Noted on CT scan - consider adding statin.

## 2021-05-06 NOTE — Assessment & Plan Note (Signed)
Appreciate onc care.  Very stable period on daily zytiga.

## 2021-05-06 NOTE — Patient Instructions (Addendum)
You are doing well today Return as needed or in 1 year for next physical/wellness visit.  We will refer you to GI to discuss colonoscopy.  Keep working on advanced directives.   Health Maintenance After Age 71 After age 87, you are at a higher risk for certain long-term diseases and infections as well as injuries from falls. Falls are a major cause of broken bones and head injuries in people who are older than age 91. Getting regular preventive care can help to keep you healthy and well. Preventive care includes getting regular testing and making lifestyle changes as recommended by your health care provider. Talk with your health care provider about:  Which screenings and tests you should have. A screening is a test that checks for a disease when you have no symptoms.  A diet and exercise plan that is right for you. What should I know about screenings and tests to prevent falls? Screening and testing are the best ways to find a health problem early. Early diagnosis and treatment give you the best chance of managing medical conditions that are common after age 12. Certain conditions and lifestyle choices may make you more likely to have a fall. Your health care provider may recommend:  Regular vision checks. Poor vision and conditions such as cataracts can make you more likely to have a fall. If you wear glasses, make sure to get your prescription updated if your vision changes.  Medicine review. Work with your health care provider to regularly review all of the medicines you are taking, including over-the-counter medicines. Ask your health care provider about any side effects that may make you more likely to have a fall. Tell your health care provider if any medicines that you take make you feel dizzy or sleepy.  Osteoporosis screening. Osteoporosis is a condition that causes the bones to get weaker. This can make the bones weak and cause them to break more easily.  Blood pressure screening.  Blood pressure changes and medicines to control blood pressure can make you feel dizzy.  Strength and balance checks. Your health care provider may recommend certain tests to check your strength and balance while standing, walking, or changing positions.  Foot health exam. Foot pain and numbness, as well as not wearing proper footwear, can make you more likely to have a fall.  Depression screening. You may be more likely to have a fall if you have a fear of falling, feel emotionally low, or feel unable to do activities that you used to do.  Alcohol use screening. Using too much alcohol can affect your balance and may make you more likely to have a fall. What actions can I take to lower my risk of falls? General instructions  Talk with your health care provider about your risks for falling. Tell your health care provider if: ? You fall. Be sure to tell your health care provider about all falls, even ones that seem minor. ? You feel dizzy, sleepy, or off-balance.  Take over-the-counter and prescription medicines only as told by your health care provider. These include any supplements.  Eat a healthy diet and maintain a healthy weight. A healthy diet includes low-fat dairy products, low-fat (lean) meats, and fiber from whole grains, beans, and lots of fruits and vegetables. Home safety  Remove any tripping hazards, such as rugs, cords, and clutter.  Install safety equipment such as grab bars in bathrooms and safety rails on stairs.  Keep rooms and walkways well-lit. Activity  Follow a  regular exercise program to stay fit. This will help you maintain your balance. Ask your health care provider what types of exercise are appropriate for you.  If you need a cane or walker, use it as recommended by your health care provider.  Wear supportive shoes that have nonskid soles.   Lifestyle  Do not drink alcohol if your health care provider tells you not to drink.  If you drink alcohol, limit  how much you have: ? 0-1 drink a day for women. ? 0-2 drinks a day for men.  Be aware of how much alcohol is in your drink. In the [place] one drink equals one typical bottle of beer (12 oz), one-half glass of wine (5 oz), or one shot of hard liquor (1 oz).  Do not use any products that contain nicotine or tobacco, such as cigarettes and e-cigarettes. If you need help quitting, ask your health care provider. Summary  Having a healthy lifestyle and getting preventive care can help to protect your health and wellness after age 29.  Screening and testing are the best way to find a health problem early and help you avoid having a fall. Early diagnosis and treatment give you the best chance for managing medical conditions that are more common for people who are older than age 70.  Falls are a major cause of broken bones and head injuries in people who are older than age 20. Take precautions to prevent a fall at home.  Work with your health care provider to learn what changes you can make to improve your health and wellness and to prevent falls. This information is not intended to replace advice given to you by your health care provider. Make sure you discuss any questions you have with your health care provider. Document Revised: 03/15/2019 Document Reviewed: 10/05/2017 Elsevier Patient Education  2021 [employer].

## 2021-05-06 NOTE — Assessment & Plan Note (Addendum)
Preventative protocols reviewed and updated unless pt declined. Discussed healthy diet and lifestyle.  Discussed colon cancer screening - h/o positive Cologuard at a time hemorrhoids were active, subsequent iFOB last year returned normal. Reviewed colonoscopy vs continued stool kits - will refer to GI to discuss possible colonoscopy.

## 2021-05-06 NOTE — Assessment & Plan Note (Addendum)
Stable period on zytiga with latest PSA >0.1 - continues to see onc regularly.

## 2021-05-06 NOTE — Assessment & Plan Note (Signed)
Incidentally noted on recent imaging study. Consider updated EKG and statin discussion at future visit. No fmhx CAD/CVA.

## 2021-05-07 ENCOUNTER — Telehealth: Payer: Self-pay | Admitting: Family Medicine

## 2021-05-07 ENCOUNTER — Encounter: Payer: Self-pay | Admitting: Oncology

## 2021-05-07 DIAGNOSIS — Z1211 Encounter for screening for malignant neoplasm of colon: Secondary | ICD-10-CM

## 2021-05-07 NOTE — Telephone Encounter (Signed)
Mr. gruenwald called in wanted to let Dr.G know that he changed is mind for the colonoscopy hes okay with the smear test.

## 2021-05-07 NOTE — Addendum Note (Signed)
Addended by: [name] on: 05/07/2021 04:34 PM   Modules accepted: Orders

## 2021-05-07 NOTE — Telephone Encounter (Signed)
Noted. Will cancel colonoscopy referral and plz mail iFOB or have him pick up.

## 2021-05-07 NOTE — Telephone Encounter (Signed)
Notified of message below

## 2021-05-07 NOTE — Addendum Note (Signed)
Addended by: [name] on: 05/07/2021 04:35 PM   Modules accepted: Orders

## 2021-05-08 NOTE — Telephone Encounter (Signed)
Spoke with pt's wife, [name] (on dpr), relaying Dr. Synthia Innocent message.  She verbalizes understanding and suggests mailing kit since they are leaving town for a few days.  She will inform pt.

## 2021-05-20 ENCOUNTER — Ambulatory Visit: Payer: PPO

## 2021-05-25 ENCOUNTER — Other Ambulatory Visit (INDEPENDENT_AMBULATORY_CARE_PROVIDER_SITE_OTHER): Payer: PPO

## 2021-05-25 DIAGNOSIS — Z1211 Encounter for screening for malignant neoplasm of colon: Secondary | ICD-10-CM

## 2021-05-25 LAB — FECAL OCCULT BLOOD, IMMUNOCHEMICAL: Fecal Occult Bld: NEGATIVE

## 2021-05-25 LAB — FECAL OCCULT BLOOD, GUAIAC: Fecal Occult Blood: NEGATIVE

## 2021-05-26 ENCOUNTER — Encounter: Payer: Self-pay | Admitting: Family Medicine

## 2021-06-05 ENCOUNTER — Telehealth: Payer: Self-pay | Admitting: *Deleted

## 2021-06-05 NOTE — Telephone Encounter (Signed)
Mr Bohlken states he started having a headache on Monday. Had been exposed to Covid. Tested positive on Tuesday. Wants to know is he needs to take monoclonal antibodies. He has only experienced a HA, still able to work in yard etc.

## 2021-06-05 NOTE — Telephone Encounter (Signed)
Notified that he does not need to do any treatment at this time. Go to PCP or UC if problems develop

## 2021-06-23 ENCOUNTER — Other Ambulatory Visit (INDEPENDENT_AMBULATORY_CARE_PROVIDER_SITE_OTHER): Payer: PPO

## 2021-06-23 ENCOUNTER — Observation Stay
Admission: EM | Admit: 2021-06-23 | Discharge: 2021-06-24 | Disposition: A | Discharge status: Home / Self Care | Payer: PPO | Attending: Hospitalist | Admitting: Hospitalist

## 2021-06-23 ENCOUNTER — Telehealth: Payer: Self-pay

## 2021-06-23 ENCOUNTER — Telehealth: Payer: Self-pay | Admitting: Emergency Medicine

## 2021-06-23 ENCOUNTER — Encounter: Payer: Self-pay | Admitting: Emergency Medicine

## 2021-06-23 ENCOUNTER — Ambulatory Visit
Admission: EM | Admit: 2021-06-23 | Discharge: 2021-06-23 | Disposition: A | Discharge status: Home / Self Care | Payer: PPO | Source: Home / Self Care

## 2021-06-23 ENCOUNTER — Ambulatory Visit (INDEPENDENT_AMBULATORY_CARE_PROVIDER_SITE_OTHER): Payer: PPO

## 2021-06-23 ENCOUNTER — Other Ambulatory Visit: Payer: Self-pay

## 2021-06-23 ENCOUNTER — Ambulatory Visit (INDEPENDENT_AMBULATORY_CARE_PROVIDER_SITE_OTHER)
Admit: 2021-06-23 | Discharge: 2021-06-23 | Disposition: A | Discharge status: Home / Self Care | Payer: PPO | Attending: Family Medicine | Admitting: Family Medicine

## 2021-06-23 ENCOUNTER — Emergency Department: Payer: PPO

## 2021-06-23 DIAGNOSIS — R1084 Generalized abdominal pain: Secondary | ICD-10-CM | POA: Insufficient documentation

## 2021-06-23 DIAGNOSIS — R079 Chest pain, unspecified: Secondary | ICD-10-CM

## 2021-06-23 DIAGNOSIS — I2699 Other pulmonary embolism without acute cor pulmonale: Principal | ICD-10-CM | POA: Insufficient documentation

## 2021-06-23 DIAGNOSIS — I2510 Atherosclerotic heart disease of native coronary artery without angina pectoris: Secondary | ICD-10-CM | POA: Insufficient documentation

## 2021-06-23 DIAGNOSIS — J90 Pleural effusion, not elsewhere classified: Secondary | ICD-10-CM

## 2021-06-23 DIAGNOSIS — R1010 Upper abdominal pain, unspecified: Secondary | ICD-10-CM | POA: Diagnosis not present

## 2021-06-23 DIAGNOSIS — N281 Cyst of kidney, acquired: Secondary | ICD-10-CM | POA: Diagnosis not present

## 2021-06-23 DIAGNOSIS — R911 Solitary pulmonary nodule: Secondary | ICD-10-CM | POA: Diagnosis not present

## 2021-06-23 DIAGNOSIS — R053 Chronic cough: Secondary | ICD-10-CM

## 2021-06-23 DIAGNOSIS — C7951 Secondary malignant neoplasm of bone: Secondary | ICD-10-CM | POA: Insufficient documentation

## 2021-06-23 DIAGNOSIS — C61 Malignant neoplasm of prostate: Secondary | ICD-10-CM

## 2021-06-23 DIAGNOSIS — R109 Unspecified abdominal pain: Secondary | ICD-10-CM

## 2021-06-23 DIAGNOSIS — Z20822 Contact with and (suspected) exposure to covid-19: Secondary | ICD-10-CM | POA: Diagnosis not present

## 2021-06-23 DIAGNOSIS — M47816 Spondylosis without myelopathy or radiculopathy, lumbar region: Secondary | ICD-10-CM | POA: Diagnosis not present

## 2021-06-23 DIAGNOSIS — Z8546 Personal history of malignant neoplasm of prostate: Secondary | ICD-10-CM | POA: Insufficient documentation

## 2021-06-23 DIAGNOSIS — Z86711 Personal history of pulmonary embolism: Secondary | ICD-10-CM | POA: Diagnosis present

## 2021-06-23 DIAGNOSIS — R059 Cough, unspecified: Secondary | ICD-10-CM | POA: Diagnosis not present

## 2021-06-23 DIAGNOSIS — R3129 Other microscopic hematuria: Secondary | ICD-10-CM

## 2021-06-23 DIAGNOSIS — Z86718 Personal history of other venous thrombosis and embolism: Secondary | ICD-10-CM

## 2021-06-23 DIAGNOSIS — C7911 Secondary malignant neoplasm of bladder: Secondary | ICD-10-CM | POA: Diagnosis present

## 2021-06-23 DIAGNOSIS — N200 Calculus of kidney: Secondary | ICD-10-CM | POA: Diagnosis not present

## 2021-06-23 DIAGNOSIS — K7689 Other specified diseases of liver: Secondary | ICD-10-CM | POA: Diagnosis not present

## 2021-06-23 LAB — URINALYSIS, COMPLETE (UACMP) WITH MICROSCOPIC
Bilirubin Urine: NEGATIVE
Glucose, UA: NEGATIVE mg/dL
Ketones, ur: NEGATIVE mg/dL
Leukocytes,Ua: NEGATIVE
Nitrite: NEGATIVE
Protein, ur: NEGATIVE mg/dL
Specific Gravity, Urine: 1.025 (ref 1.005–1.030)
pH: 6 (ref 5.0–8.0)

## 2021-06-23 LAB — COMPREHENSIVE METABOLIC PANEL
ALT: 31 U/L (ref 0–44)
AST: 31 U/L (ref 15–41)
Albumin: 4 g/dL (ref 3.5–5.0)
Alkaline Phosphatase: 56 U/L (ref 38–126)
Anion gap: 8 (ref 5–15)
BUN: 19 mg/dL (ref 8–23)
CO2: 25 mmol/L (ref 22–32)
Calcium: 9.5 mg/dL (ref 8.9–10.3)
Chloride: 103 mmol/L (ref 98–111)
Creatinine, Ser: 1.14 mg/dL (ref 0.61–1.24)
GFR, Estimated: >60 mL/min (ref >60)
Glucose, Bld: 144 mg/dL — ABNORMAL HIGH (ref 70–99)
Potassium: 3.7 mmol/L (ref 3.5–5.1)
Sodium: 136 mmol/L (ref 135–145)
Total Bilirubin: 1.3 mg/dL — ABNORMAL HIGH (ref 0.3–1.2)
Total Protein: 7.6 g/dL (ref 6.5–8.1)

## 2021-06-23 LAB — LIPASE, BLOOD: Lipase: 28 U/L (ref 11–51)

## 2021-06-23 LAB — CBC WITH DIFFERENTIAL/PLATELET
Abs Immature Granulocytes: 0.11 10*3/uL — ABNORMAL HIGH (ref 0.00–0.07)
Basophils Absolute: 0 10*3/uL (ref 0.0–0.1)
Basophils Relative: 0 %
Eosinophils Absolute: 0.2 10*3/uL (ref 0.0–0.5)
Eosinophils Relative: 2 %
HCT: 36.7 % — ABNORMAL LOW (ref 39.0–52.0)
Hemoglobin: 12.5 g/dL — ABNORMAL LOW (ref 13.0–17.0)
Immature Granulocytes: 1 %
Lymphocytes Relative: 8 %
Lymphs Abs: 0.8 10*3/uL (ref 0.7–4.0)
MCH: 31.7 pg (ref 26.0–34.0)
MCHC: 34.1 g/dL (ref 30.0–36.0)
MCV: 93.1 fL (ref 80.0–100.0)
Monocytes Absolute: 1.2 10*3/uL — ABNORMAL HIGH (ref 0.1–1.0)
Monocytes Relative: 11 %
Neutro Abs: 7.9 10*3/uL — ABNORMAL HIGH (ref 1.7–7.7)
Neutrophils Relative %: 78 %
Platelets: 228 10*3/uL (ref 150–400)
RBC: 3.94 MIL/uL — ABNORMAL LOW (ref 4.22–5.81)
RDW: 11.8 % (ref 11.5–15.5)
WBC: 10.2 10*3/uL (ref 4.0–10.5)
nRBC: 0 % (ref 0.0–0.2)

## 2021-06-23 LAB — CBC
HCT: 36.5 % — ABNORMAL LOW (ref 39.0–52.0)
Hemoglobin: 12.5 g/dL — ABNORMAL LOW (ref 13.0–17.0)
MCH: 32.2 pg (ref 26.0–34.0)
MCHC: 34.2 g/dL (ref 30.0–36.0)
MCV: 94.1 fL (ref 80.0–100.0)
Platelets: 220 10*3/uL (ref 150–400)
RBC: 3.88 MIL/uL — ABNORMAL LOW (ref 4.22–5.81)
RDW: 11.8 % (ref 11.5–15.5)
WBC: 10.2 10*3/uL (ref 4.0–10.5)
nRBC: 0 % (ref 0.0–0.2)

## 2021-06-23 LAB — D-DIMER, QUANTITATIVE
D-Dimer, Quant: 1.38 mcg/mL FEU — ABNORMAL HIGH (ref <0.50)
D-Dimer, Quant: 1.39 ug/mL-FEU — ABNORMAL HIGH (ref 0.00–0.50)

## 2021-06-23 LAB — BASIC METABOLIC PANEL
Anion gap: 9 (ref 5–15)
BUN: 20 mg/dL (ref 8–23)
CO2: 26 mmol/L (ref 22–32)
Calcium: 9.4 mg/dL (ref 8.9–10.3)
Chloride: 100 mmol/L (ref 98–111)
Creatinine, Ser: 1.15 mg/dL (ref 0.61–1.24)
GFR, Estimated: >60 mL/min (ref >60)
Glucose, Bld: 200 mg/dL — ABNORMAL HIGH (ref 70–99)
Potassium: 3.8 mmol/L (ref 3.5–5.1)
Sodium: 135 mmol/L (ref 135–145)

## 2021-06-23 LAB — TROPONIN I (HIGH SENSITIVITY): Troponin I (High Sensitivity): 7 ng/L (ref <18)

## 2021-06-23 IMAGING — CR DG CHEST 2V
2 series · 2 of 2 positions shown · non-contrast
Comparison: [DATE]

CLINICAL DATA: Chest pain, chronic cough

EXAM:
CHEST - 2 VIEW

[chest pa]
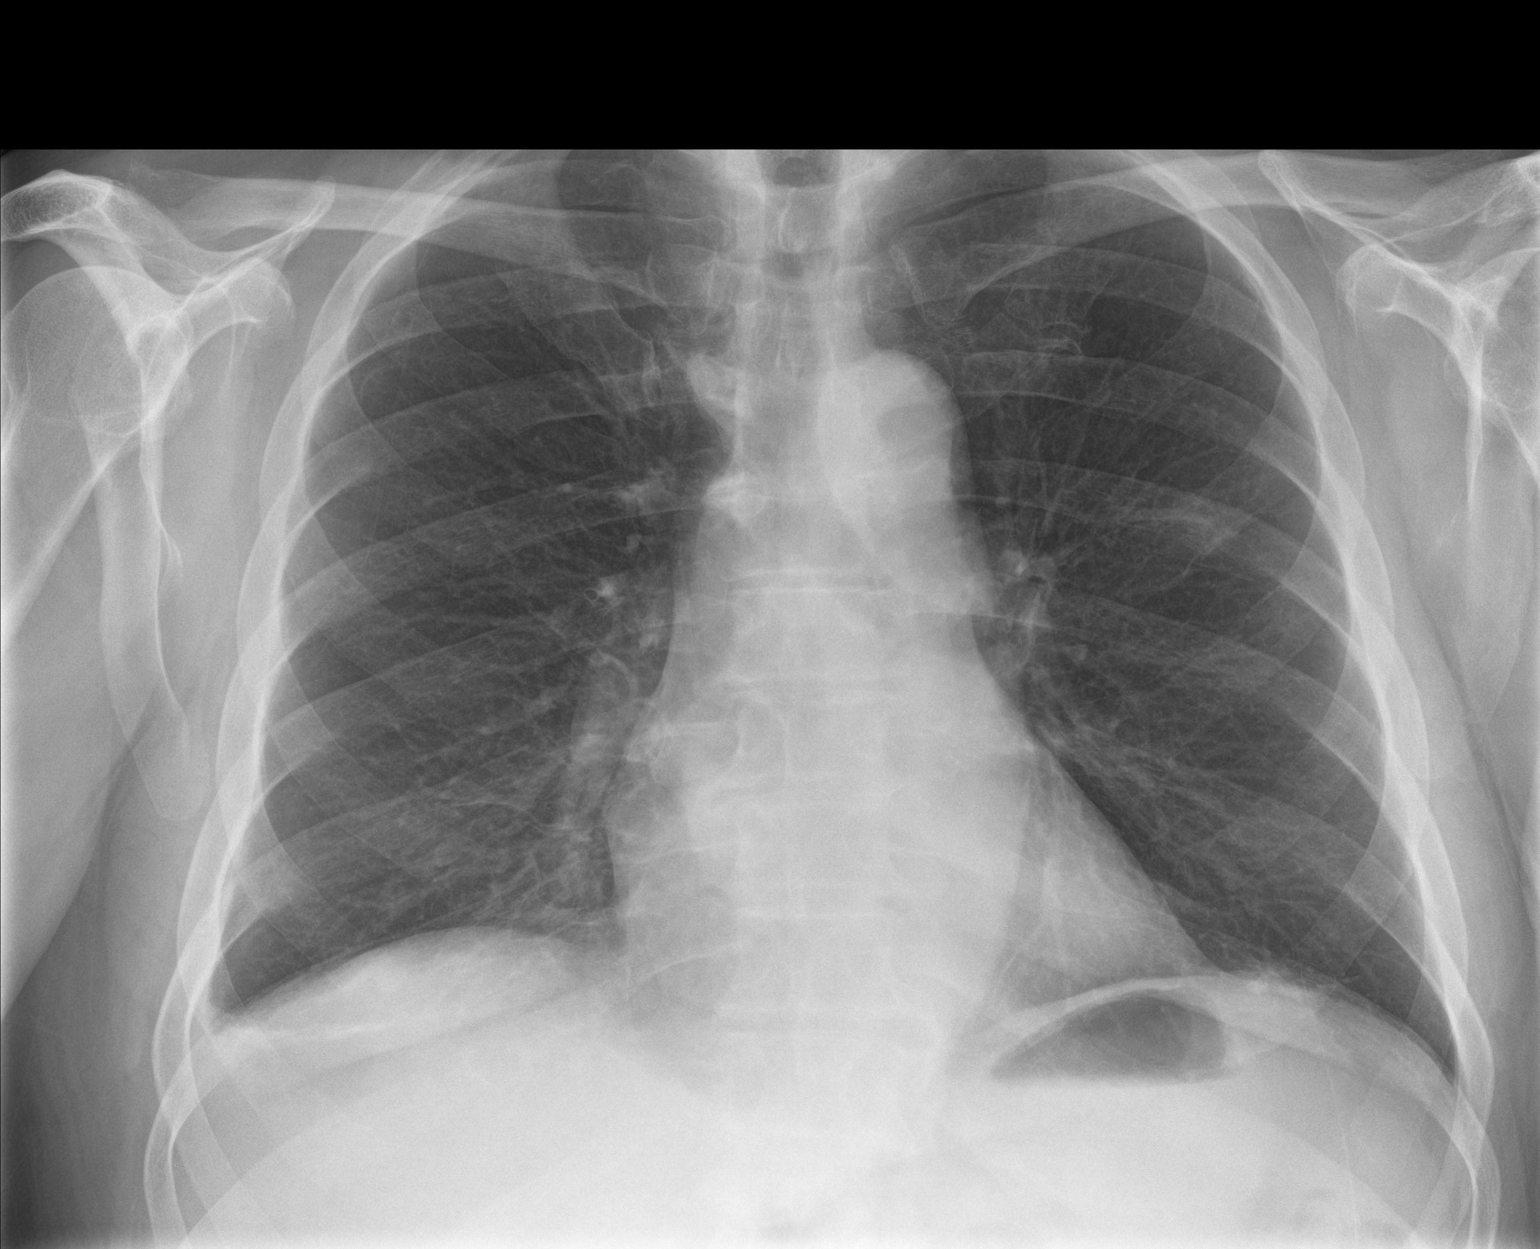

[chest lat]
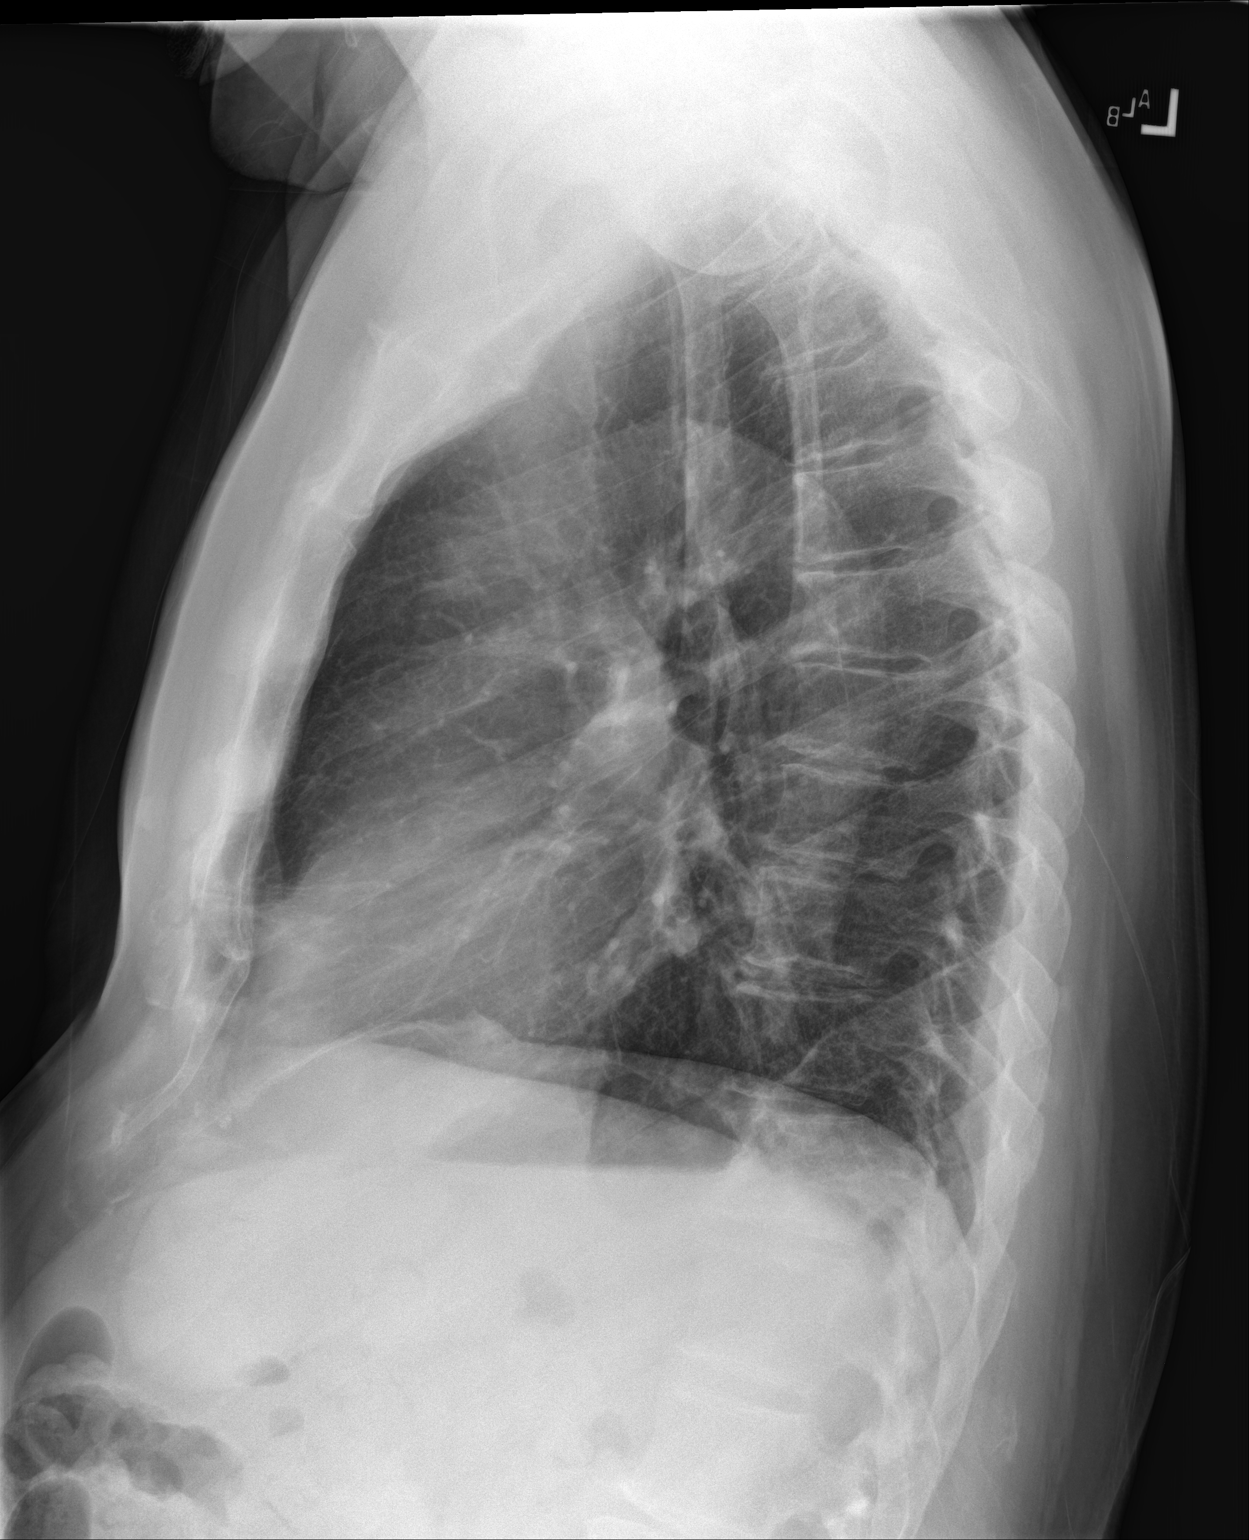

[2 of 2 positions shown; findings below may reference images not displayed]

FINDINGS: The heart size and mediastinal contours are within normal limits.
Minimal atherosclerotic calcification of the aortic knob. No focal
airspace consolidation, pleural effusion, or pneumothorax. The
visualized skeletal structures are unremarkable.
IMPRESSION: No active cardiopulmonary disease.

## 2021-06-23 IMAGING — CT CT ANGIO CHEST
2 of 6 series · 17 of 46 positions shown · IV contrast (omnipaque)
Comparison: Partial comparison to CT abdomen/pelvis dated
[DATE]. CT chest dated [DATE].

CLINICAL DATA: Upper abdominal/right rib pain, right pleural
effusion on CT, elevated D-dimer

EXAM:
CT ANGIOGRAPHY CHEST WITH CONTRAST
TECHNIQUE: Multidetector CT imaging of the chest was performed using the
standard protocol during bolus administration of intravenous
contrast. Multiplanar CT image reconstructions and MIPs were
obtained to evaluate the vascular anatomy.
CONTRAST:  75mL OMNIPAQUE IOHEXOL 350 MG/ML SOLN

[Series 5: thins · axial · 0.76mm/px · z∈[-680,-380]mm · 14 of 411 slices shown]
[im 18/411  lung]
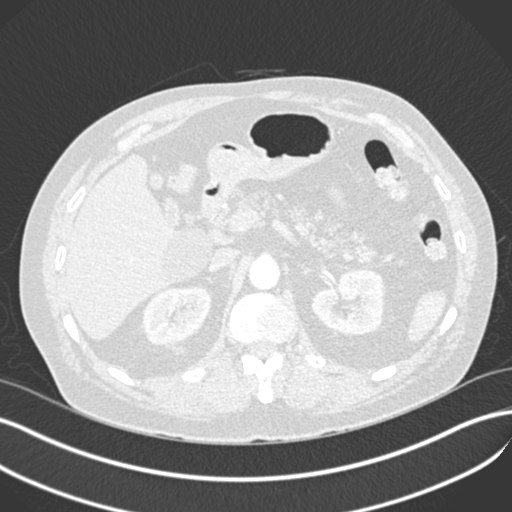
[im 52/411  soft-tissue]
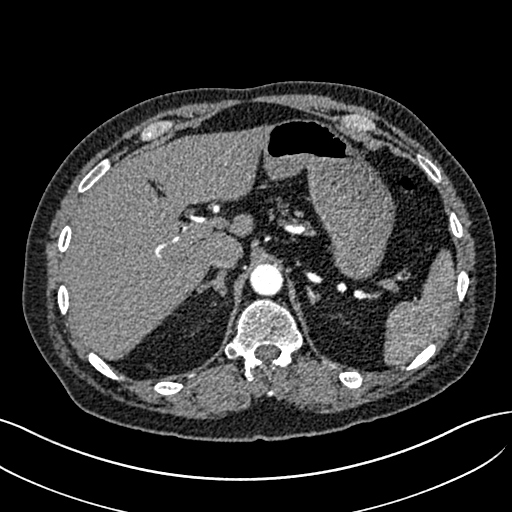
[im 86/411  lung]
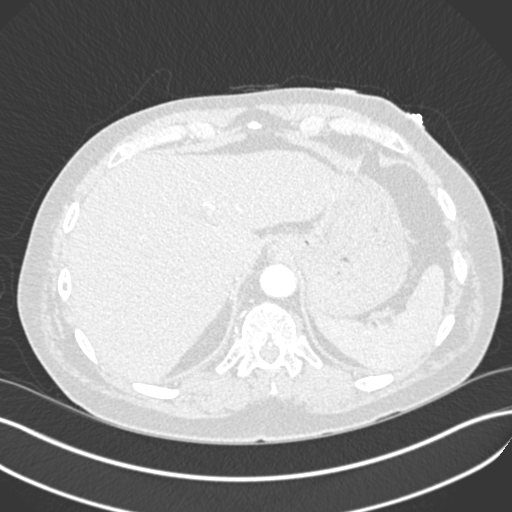
[im 103/411  soft-tissue]
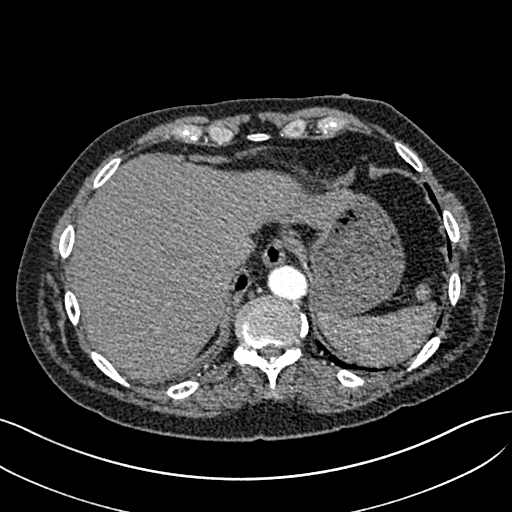
[im 137/411  lung]
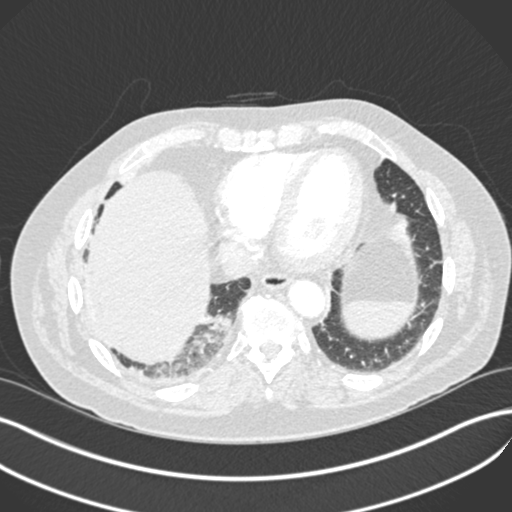
[im 171/411  soft-tissue]
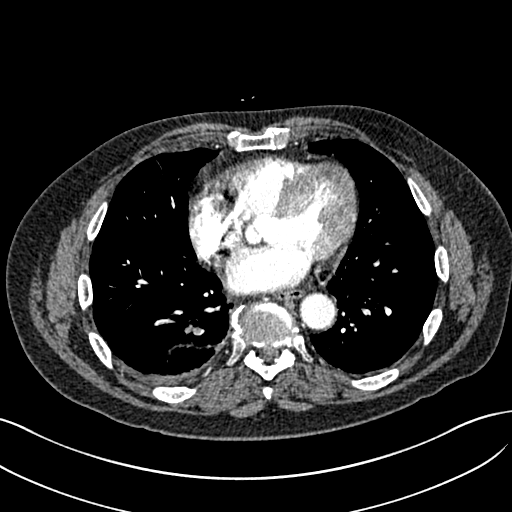
[im 188/411  lung]
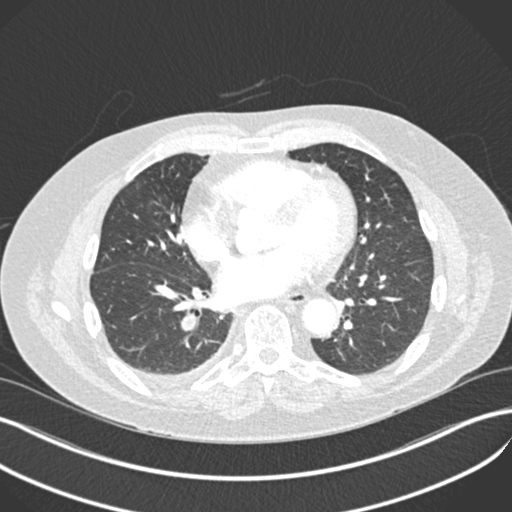
[im 223/411  soft-tissue]
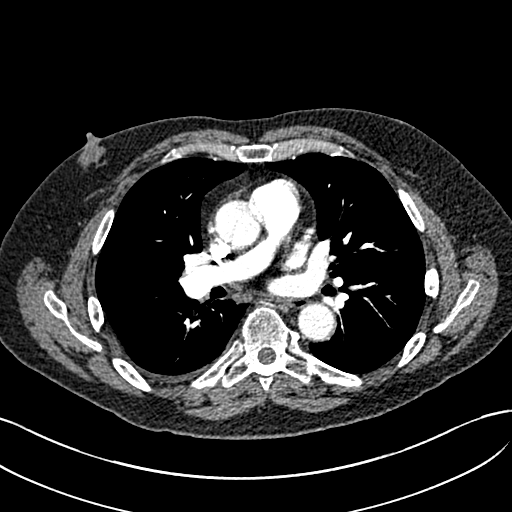
[im 240/411  lung]
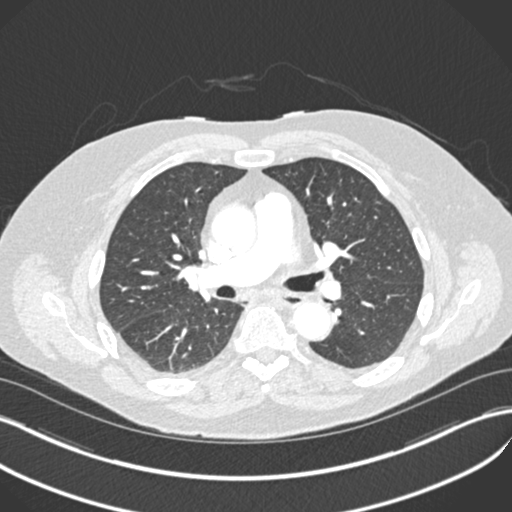
[im 274/411  soft-tissue]
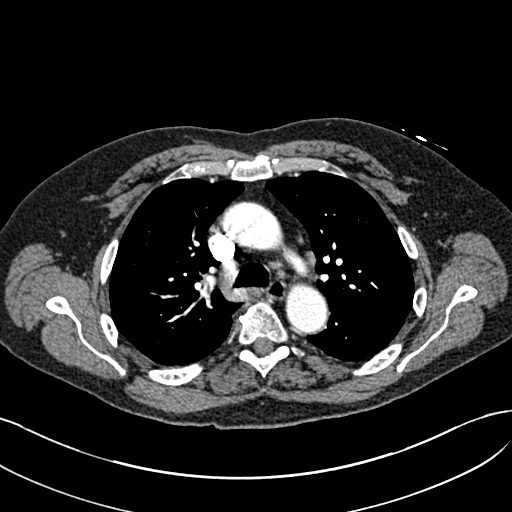
[im 308/411  lung]
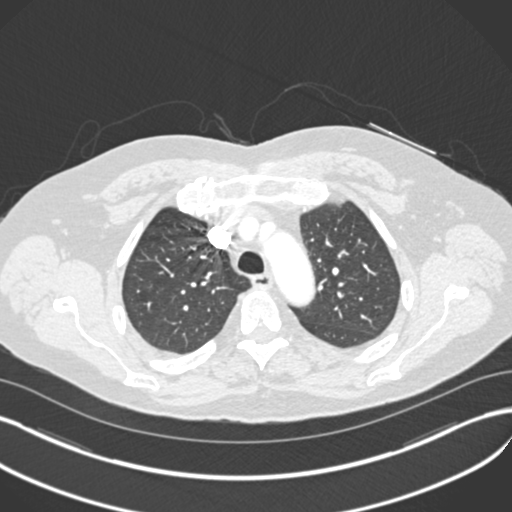
[im 325/411  soft-tissue]
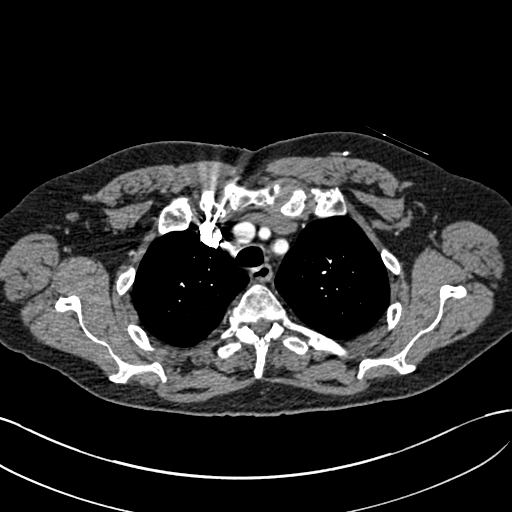
[im 359/411  lung]
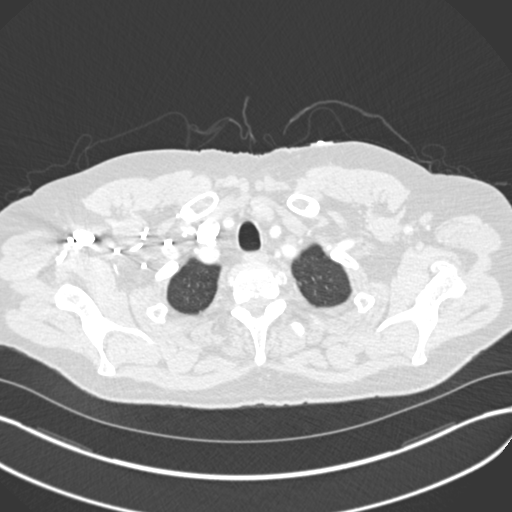
[im 393/411  soft-tissue]
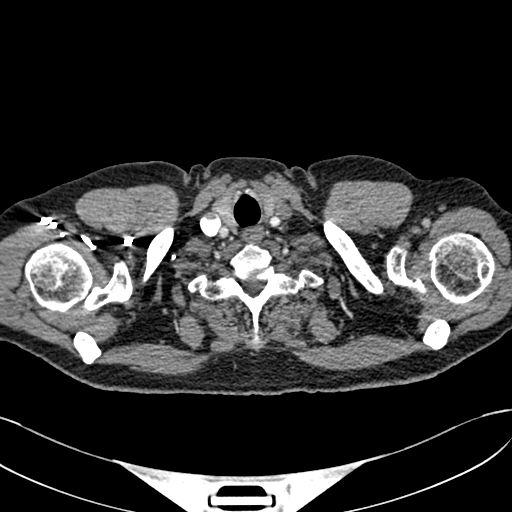

[Series 7: coronal mpr · coronal · 0.64mm/px · 3 of 99 slices shown]
[im 25/99  soft-tissue]
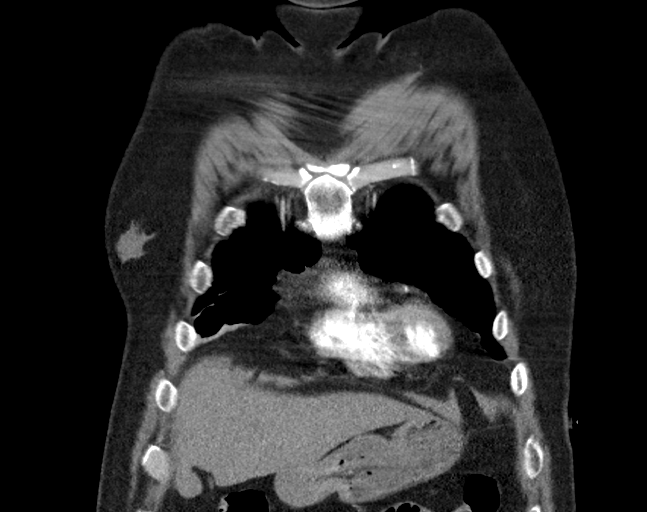
[im 50/99  soft-tissue]
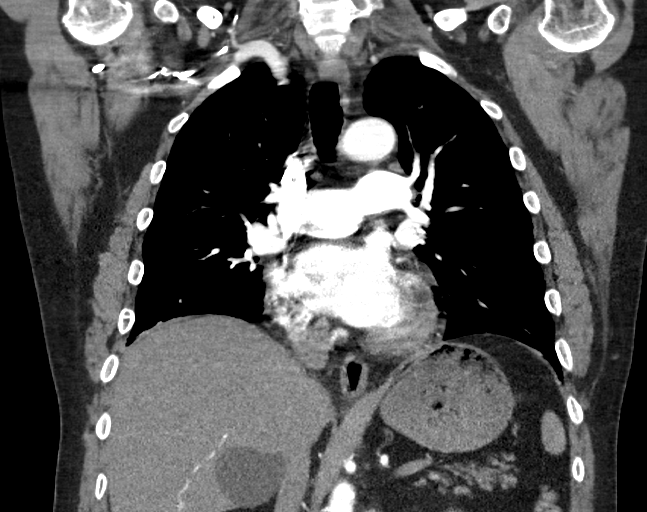
[im 74/99  soft-tissue]
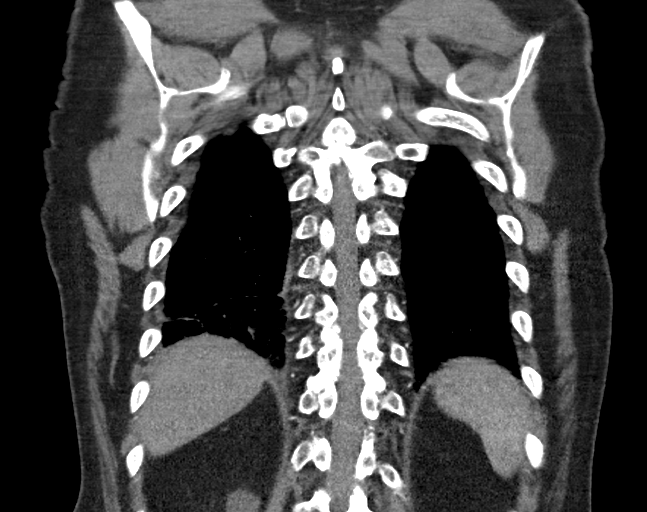

[17 of 46 positions shown; findings below may reference images not displayed]

FINDINGS: Cardiovascular: Satisfactory opacification of the bilateral
pulmonary arteries to the segmental level. Lobar/segmental pulmonary
embolism in the posterior right lower lobe (series 5/images 208,
221, 236, and 242). Overall clot burden is small. No evidence of
right heart strain.

Although not tailored for evaluation of the thoracic aorta, there is
no evidence thoracic aortic aneurysm or dissection. Very mild
atherosclerotic calcifications of the aortic arch.

The heart is normal in size.

Mild coronary atherosclerosis of the LAD.

Mediastinum/Nodes: No suspicious mediastinal lymphadenopathy.

Visualized thyroid is unremarkable.

Lungs/Pleura: 6 mm right lower lobe pulmonary nodule (series 6/image
63), unchanged.

Platelike scarring/atelectasis inferiorly in the right middle lobe
and right lower lobe. Superimposed ground-glass opacity in the
posterior right lower lobe may reflect early/developing pulmonary
infarct (series 6/image 74), equivocal.

Trace right pleural effusion.

No pneumothorax.

Upper Abdomen: Visualized upper abdomen is better evaluated on
recent CT.

Musculoskeletal: Mild degenerative changes of the mid thoracic
spine. Healed left anterior 3rd rib fracture (series 4/image 44).

Review of the MIP images confirms the above findings.
IMPRESSION: Lobar/segmental pulmonary embolism in the posterior right lower
lobe. Overall clot burden is small. No evidence of right heart
strain.

Possible early/developing pulmonary infarct in the posterior right
lower lobe, equivocal. Trace right pleural effusion.

Aortic Atherosclerosis ([VV]-[VV]).

Critical Value/emergent results were called by telephone at the time
of interpretation on [DATE] at [DATE] to provider CHAM
, who verbally acknowledged these results.

## 2021-06-23 IMAGING — CT CT RENAL STONE PROTOCOL
1 of 2 series · 15 of 32 positions shown, 19 images · non-contrast
Comparison: [DATE]

CLINICAL DATA: Right-sided flank pain for 2-3 days, history of
prostate carcinoma

EXAM:
CT ABDOMEN AND PELVIS WITHOUT CONTRAST
TECHNIQUE: Multidetector CT imaging of the abdomen and pelvis was performed
following the standard protocol without IV contrast.

[Series 2: axial st · axial · 0.76mm/px · z∈[-1300,-825]mm · 15 of 105 slices shown, 19 images]
[im 5/105  soft-tissue]
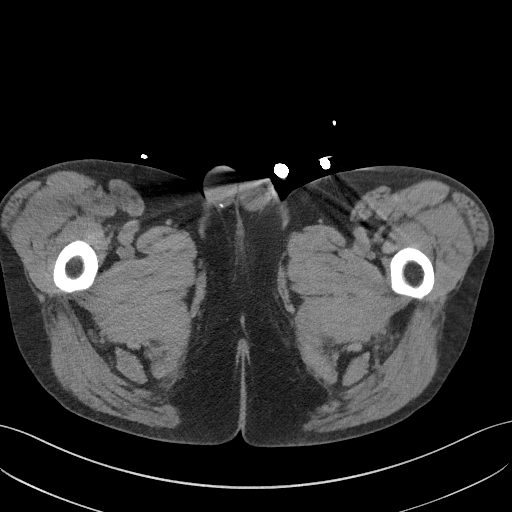
[im 5/105  bone]
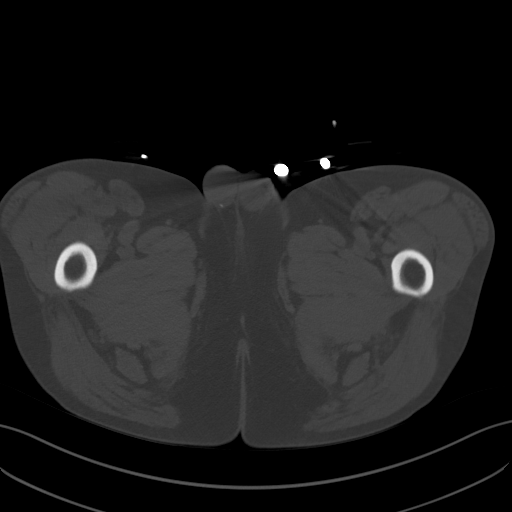
[im 13/105  soft-tissue]
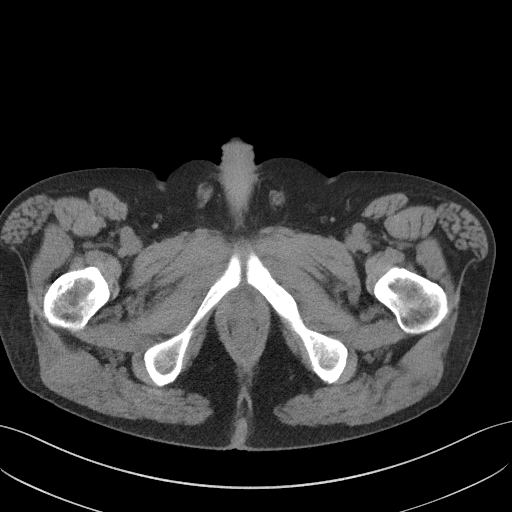
[im 21/105  soft-tissue]
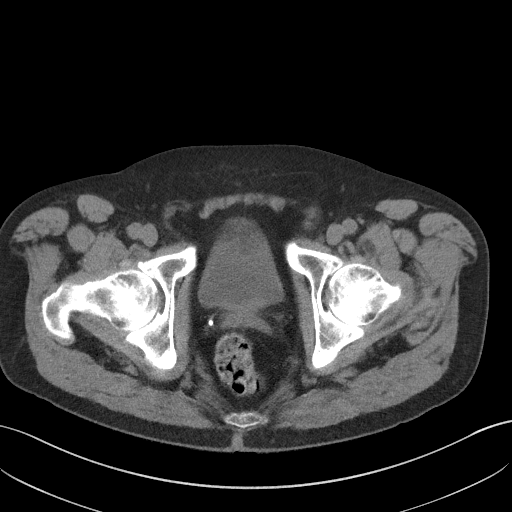
[im 30/105  soft-tissue]
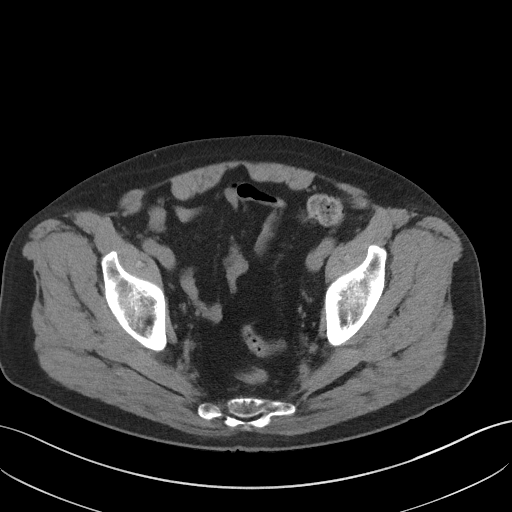
[im 38/105  soft-tissue]
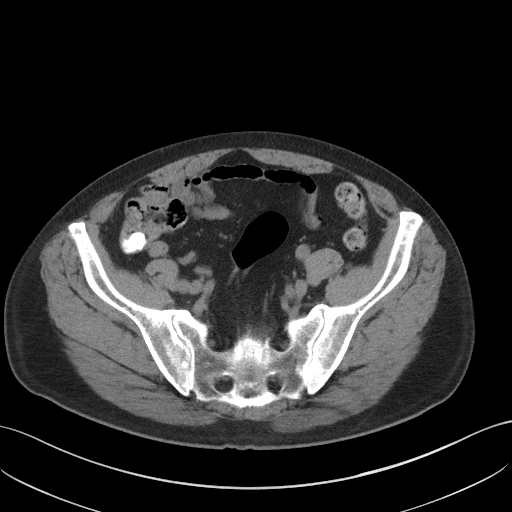
[im 46/105  soft-tissue]
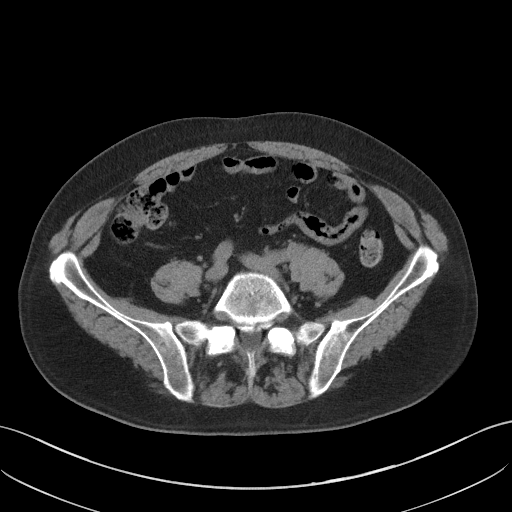
[im 55/105  soft-tissue]
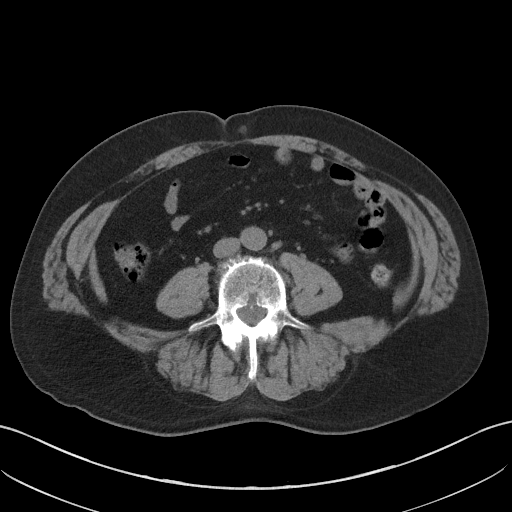
[im 59/105  soft-tissue]
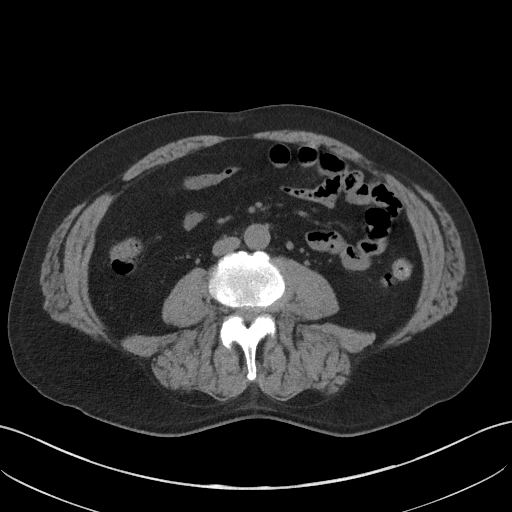
[im 67/105  soft-tissue]
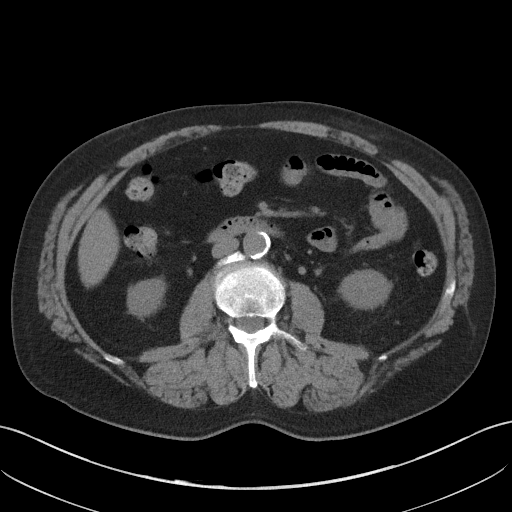
[im 67/105  bone]
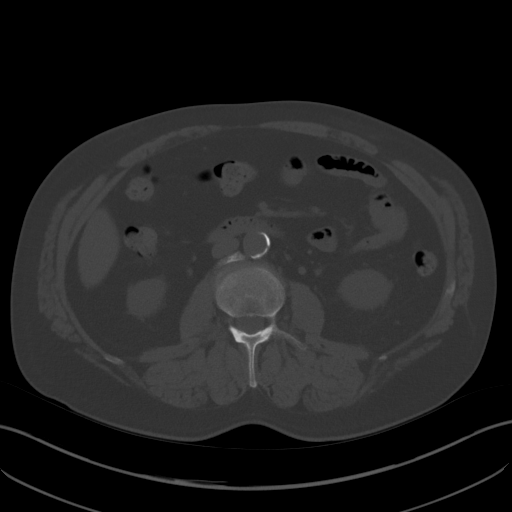
[im 75/105  soft-tissue]
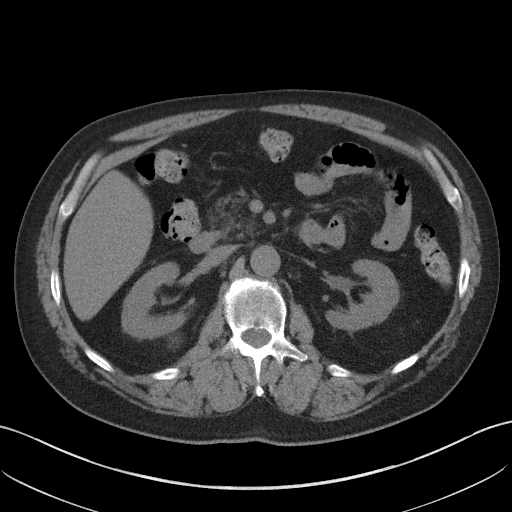
[im 84/105  soft-tissue]
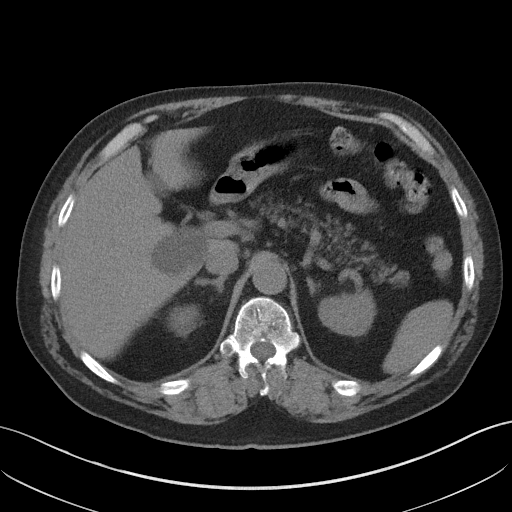
[im 88/105  lung]
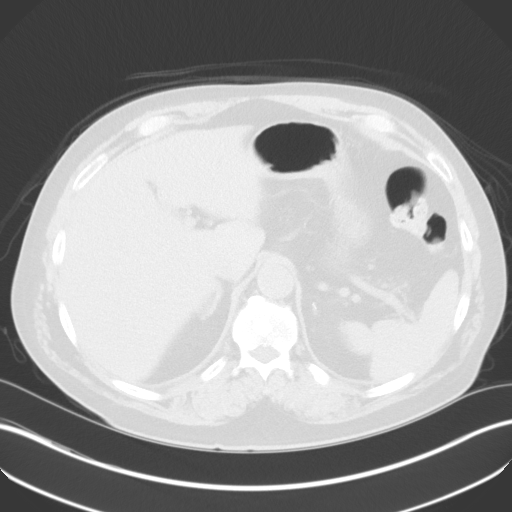
[im 92/105  soft-tissue]
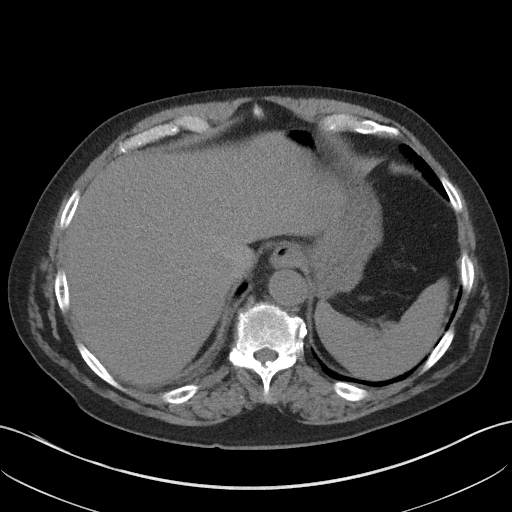
[im 92/105  lung]
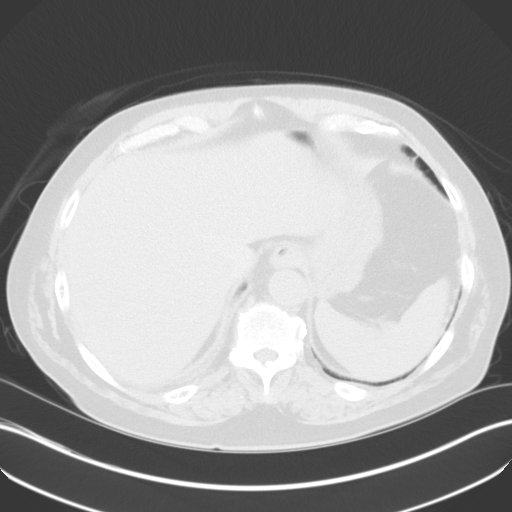
[im 96/105  lung]
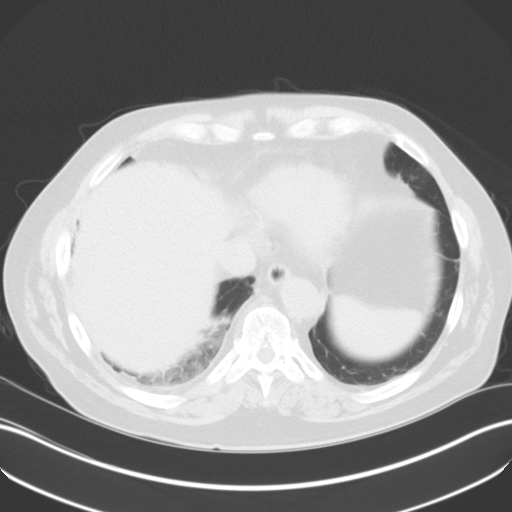
[im 100/105  soft-tissue]
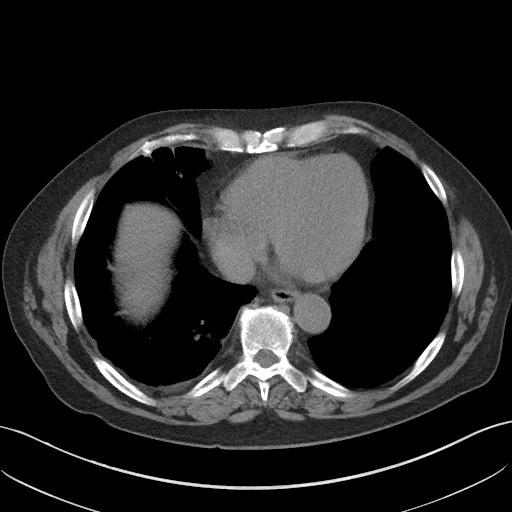
[im 100/105  lung]
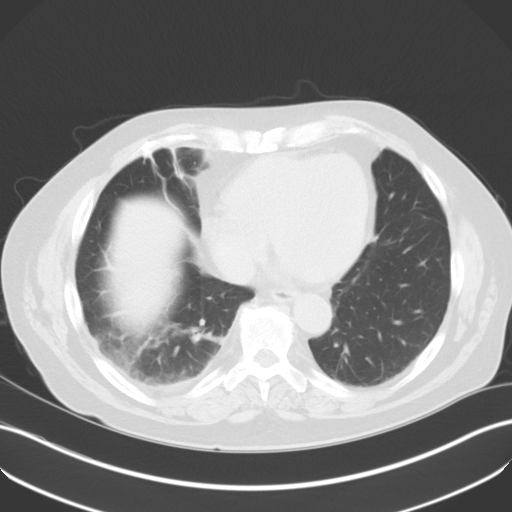

[15 of 32 positions shown; findings below may reference images not displayed]

FINDINGS: Lower chest: Right lower lobe nodule is again seen and stable. Small
effusion is noted. Mild dependent atelectatic changes are seen.

Hepatobiliary: Gallbladder is within normal limits. Hepatic cysts
are again seen and stable.

Pancreas: Unremarkable. No pancreatic ductal dilatation or
surrounding inflammatory changes.

Spleen: Normal in size without focal abnormality.

Adrenals/Urinary Tract: Adrenal glands are within normal limits.
Kidneys show nonobstructing renal stone in the lower pole of the
left kidney. Right renal cyst is seen stable in appearance from the
prior CT. Bladder is partially distended. Ureters are within normal
limits.

Stomach/Bowel: Appendix is not well visualized. No inflammatory
changes are seen to suggest appendicitis. Small bowel and stomach
appear within normal limits.

Vascular/Lymphatic: Aortic atherosclerosis. No enlarged abdominal or
pelvic lymph nodes.

Reproductive: Prostate is unremarkable.

Other: No abdominal wall hernia or abnormality. No abdominopelvic
ascites.

Musculoskeletal: Degenerative changes of the lumbar spine are noted.
IMPRESSION: Small right-sided pleural effusion is noted. Mild dependent
atelectatic changes are seen. No focal confluent infiltrate is
noted.

Stable nonobstructing left lower pole renal stone.

Stable nodule in the right lower lobe.

## 2021-06-23 MED ORDER — IOHEXOL 350 MG/ML SOLN
75.0000 mL | Freq: Once | INTRAVENOUS | Status: AC | PRN
  Administered 2021-06-23: 75 mL via INTRAVENOUS

## 2021-06-23 NOTE — Addendum Note (Signed)
Addended by: [name] on: 06/23/2021 02:54 PM   Modules accepted: Orders

## 2021-06-23 NOTE — ED Provider Notes (Signed)
[facility] Emergency Department Provider Note   ____________________________________________   Event Date/Time   First MD Initiated Contact with Patient 06/23/21 2342     (approximate)  I have reviewed the triage vital signs and the nursing notes.   HISTORY  Chief Complaint Chest Pain    HPI Shane [name] is a 70 y.o. male referred by his PCP to the ED to evaluate PE for elevated D-dimer found in the office today.  Patient has a history of prostate cancer, DVT 02/2020 not currently on anticoagulation who saw his PCP today for a 3-day history of right lower chest/flank pain.  CT renal was done as well as lab work which demonstrated positive D-dimer.  Patient's also reports he had a home positive COVID-19 test 3 weeks ago.  His only symptom was headache.  He tested because his other family members were positive.  Patient is vaccinated and boosted twice against COVID-19.  Denies fever, cough, abdominal pain, nausea, vomiting or dizziness.  Reports mild dyspnea on exertion.     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 ([place]) 06/28/2019   Cystoscopy [name]) - 2 bladder tumors, 1 on anterior prostate planned TURBT/TURP 06/2019 Biopsy - Gleason 5+4=9 prostate cancer [facility] started 07/2019 planned metastatic survey   History of chicken pox    Lipoma of neck    Seasonal allergies    Syncopal episodes 2007   x 2 s/p unrevealing hospitalization without recurrence    Patient Active Problem List   Diagnosis Date Noted   Acute pulmonary embolism ([place]) 06/24/2021   Atherosclerosis of aorta ([place]) 05/06/2021   CAD (coronary artery disease) 05/06/2021   History of DVT of lower extremity 02/25/2020   Hydronephrosis 02/13/2020   Loss of taste 01/31/2020   Bone metastases ([place]) 12/31/2019   Recurrent UTI 12/31/2019   Malignant neoplasm of prostate ([place]) 08/17/2019   Cancer involving bladder by direct  extension from prostate ([place]) 06/28/2019   HLD (hyperlipidemia) 08/02/2018   Health maintenance examination 08/05/2017   Anosmia 08/05/2017   Family history of hemochromatosis 08/03/2016   Medicare annual wellness visit, subsequent 07/29/2015   Advanced care planning/counseling discussion 07/29/2015   Bradycardia 07/29/2015   History of cardiac monitoring 07/29/2015   Syncopal episodes     Past Surgical History:  Procedure Laterality Date   CYSTOSCOPY W/ RETROGRADES Bilateral 07/06/2019   Procedure: [place];  Surgeon: [name], MD;  Location: [facility];  Service: Urology;  Laterality: Bilateral;   EP IMPLANTABLE DEVICE N/A 02/25/2016   Procedure: Loop Recorder Removal;  Surgeon: Deboraha Sprang, MD;  Location: [place] [facility];  Service: Cardiovascular;  Laterality: N/A;   Internal Cardiac Monitor  2007   due to syncopal episodes - never contacted for f/u   IR NEPHROSTOMY PLACEMENT LEFT  02/14/2020   IR NEPHROSTOMY PLACEMENT RIGHT  02/14/2020   LIPOMA EXCISION N/A 02/09/2016   Procedure: EXCISION NECK LIPOMA;  Surgeon: Autumn Messing III, MD;  Location: [place];  Service: General;  Laterality: N/A;   TRANSURETHRAL RESECTION OF BLADDER TUMOR N/A 07/06/2019   Procedure: TRANSURETHRAL RESECTION OF BLADDER TUMOR (TURBT);  Surgeon: [name], MD;  Location: [facility];  Service: Urology;  Laterality: N/A;    Prior to Admission medications   Medication Sig Start Date End Date Taking? Authorizing Provider  abiraterone acetate (ZYTIGA) 250 MG tablet Take 4 tablets (1,000 mg total) by mouth  daily. Take on an empty stomach 1 hour before or 2 hours after a meal 08/05/20  Yes Shadad, Mathis Dad, MD  Calcium-Magnesium-Vitamin D (CITRACAL SLOW RELEASE PO) Take 2 tablets by mouth daily.   Yes [provider]  Leuprolide Acetate, 4 Month, (ELIGARD) 30 MG injection Inject 30 mg into the skin every 4 (four) months.   Yes  [provider]  Mirabegron (MYRBETRIQ PO) Take 1 tablet by mouth daily.   Yes [provider]  predniSONE (DELTASONE) 5 MG tablet TAKE ONE TABLET BY MOUTH DAILY WITH BREAKFAST 05/05/21  Yes [name], MD  venlafaxine (EFFEXOR) 25 MG tablet Take 1 tablet (25 mg total) by mouth 2 (two) times daily. 05/06/21  Yes [name], MD    Allergies Bactrim [sulfamethoxazole-trimethoprim], Hydrocodone, Percocet [oxycodone-acetaminophen], Vancomycin, Penicillins, and Zithromax [azithromycin]  Family History  Problem Relation Age of Onset   Hyperlipidemia Mother    Cancer Mother        peritoneal cancer   Hemochromatosis Father        s/p liver transplant - pt screened negative 07/2015   Hemochromatosis Paternal Grandmother    Hemochromatosis Paternal Uncle    Stroke Neg Hx    Diabetes Neg Hx    CAD Neg Hx     Social History Social History   Tobacco Use   Smoking status: Never   Smokeless tobacco: Never  Vaping Use   Vaping Use: Never used  Substance Use Topics   Alcohol use: Yes    Alcohol/week: 0.0 standard drinks    Comment: 1 beer most evenings   Drug use: No    Review of Systems  Constitutional: No fever/chills Eyes: No visual changes. ENT: No sore throat. Cardiovascular: Positive for chest pain. Respiratory: Positive for shortness of breath. Gastrointestinal: Positive for right flank pain.  No abdominal pain.  No nausea, no vomiting.  No diarrhea.  No constipation. Genitourinary: Negative for dysuria. Musculoskeletal: Negative for back pain. Skin: Negative for rash. Neurological: Negative for headaches, focal weakness or numbness.   ____________________________________________   PHYSICAL EXAM:  VITAL SIGNS: ED Triage Vitals [06/23/21 2119]  Enc Vitals Group     BP (!) 175/73     Pulse Rate 82     Resp 18     Temp 98.3 F (36.8 C)     Temp Source Oral     SpO2 97 %     Weight 191 lb (86.6 kg)     Height 6' (1.829 m)     Head  Circumference      Peak Flow      Pain Score      Pain Loc      Pain Edu?      Excl. in [place]?     Constitutional: Alert and oriented. Well appearing and in mild acute distress. Eyes: Conjunctivae are normal. PERRL. EOMI. Head: Atraumatic. Nose: No congestion/rhinnorhea. Mouth/Throat: Mucous membranes are moist.   Neck: No stridor.   Cardiovascular: Normal rate, regular rhythm. Grossly normal heart sounds.  Good peripheral circulation. Respiratory: Normal respiratory effort.  No retractions. Lungs CTAB.  Occasionally winces from pain to his right lower chest/flank. Gastrointestinal: Soft and nontender. No distention. No abdominal bruits. No CVA tenderness. Musculoskeletal: No lower extremity tenderness nor edema.  No joint effusions. Neurologic:  Normal speech and language. No gross focal neurologic deficits are appreciated. No gait instability. Skin:  Skin is warm, dry and intact. No rash noted. Psychiatric: Mood and affect are normal. Speech and behavior are  normal.  ____________________________________________   LABS (all labs ordered are listed, but only abnormal results are displayed)  Labs Reviewed  BASIC METABOLIC PANEL - Abnormal; Notable for the following components:      Result Value   Glucose, Bld 200 (*)    All other components within normal limits  CBC - Abnormal; Notable for the following components:   RBC 3.88 (*)    Hemoglobin 12.5 (*)    HCT 36.5 (*)    All other components within normal limits  D-DIMER, QUANTITATIVE - Abnormal; Notable for the following components:   D-Dimer, Quant 1.39 (*)    All other components within normal limits  BASIC METABOLIC PANEL - Abnormal; Notable for the following components:   Glucose, Bld 114 (*)    All other components within normal limits  CBC - Abnormal; Notable for the following components:   RBC 3.50 (*)    Hemoglobin 11.4 (*)    HCT 33.8 (*)    All other components within normal limits  RESP PANEL BY RT-PCR (FLU  A&B, COVID) ARPGX2  PROTIME-INR  APTT  HIV ANTIBODY (ROUTINE TESTING W REFLEX)  HEPARIN LEVEL (UNFRACTIONATED)  TROPONIN I (HIGH SENSITIVITY)  TROPONIN I (HIGH SENSITIVITY)   ____________________________________________  EKG  ED ECG REPORT I, Yoav Okane J, the attending physician, personally viewed and interpreted this ECG.   Date: 06/24/2021  EKG Time: 2114  Rate: 82  Rhythm: normal EKG, normal sinus rhythm  Axis: Normal  Intervals:none  ST&T Change: Nonspecific  ____________________________________________  RADIOLOGY I, Maguire Sime J, personally viewed and evaluated these images (plain radiographs) as part of my medical decision making, as well as reviewing the written report by the radiologist.  ED MD interpretation: No acute cardiopulmonary process; CTA chest demonstrates right lower lobe PE with developing pulmonary infarct; no right heart strain  Official radiology report(s): DG Chest 2 View  Result Date: 06/23/2021 CLINICAL DATA:  Chest pain, chronic cough EXAM: CHEST - 2 VIEW COMPARISON:  01/15/2021 FINDINGS: The heart size and mediastinal contours are within normal limits. Minimal atherosclerotic calcification of the aortic knob. No focal airspace consolidation, pleural effusion, or pneumothorax. The visualized skeletal structures are unremarkable. IMPRESSION: No active cardiopulmonary disease. Electronically Signed   By: Davina Poke D.O.   On: 06/23/2021 11:09   CT Angio Chest PE W and/or Wo Contrast  Result Date: 06/23/2021 CLINICAL DATA:  Upper abdominal/right rib pain, right pleural effusion on CT, elevated D-dimer EXAM: CT ANGIOGRAPHY CHEST WITH CONTRAST TECHNIQUE: Multidetector CT imaging of the chest was performed using the standard protocol during bolus administration of intravenous contrast. Multiplanar CT image reconstructions and MIPs were obtained to evaluate the vascular anatomy. CONTRAST:  42mL OMNIPAQUE IOHEXOL 350 MG/ML SOLN COMPARISON:  Partial  comparison to CT abdomen/pelvis dated 06/23/2021. CT chest dated 01/15/2021. FINDINGS: Cardiovascular: Satisfactory opacification of the bilateral pulmonary arteries to the segmental level. Lobar/segmental pulmonary embolism in the posterior right lower lobe (series 5/images 208, 221, 236, and 242). Overall clot burden is small. No evidence of right heart strain. Although not tailored for evaluation of the thoracic aorta, there is no evidence thoracic aortic aneurysm or dissection. Very mild atherosclerotic calcifications of the aortic arch. The heart is normal in size. Mild coronary atherosclerosis of the LAD. Mediastinum/Nodes: No suspicious mediastinal lymphadenopathy. Visualized thyroid is unremarkable. Lungs/Pleura: 6 mm right lower lobe pulmonary nodule (series 6/image 63), unchanged. Platelike scarring/atelectasis inferiorly in the right middle lobe and right lower lobe. Superimposed ground-glass opacity in the posterior right lower lobe may reflect  early/developing pulmonary infarct (series 6/image 74), equivocal. Trace right pleural effusion. No pneumothorax. Upper Abdomen: Visualized upper abdomen is better evaluated on recent CT. Musculoskeletal: Mild degenerative changes of the mid thoracic spine. Healed left anterior 3rd rib fracture (series 4/image 44). Review of the MIP images confirms the above findings. IMPRESSION: Lobar/segmental pulmonary embolism in the posterior right lower lobe. Overall clot burden is small. No evidence of right heart strain. Possible early/developing pulmonary infarct in the posterior right lower lobe, equivocal. Trace right pleural effusion. Aortic Atherosclerosis (ICD10-I70.0). Critical Value/emergent results were called by telephone at the time of interpretation on 06/23/2021 at 11:06 pm to provider Conni Slipper , who verbally acknowledged these results. Electronically Signed   By: Julian Hy M.D.   On: 06/23/2021 23:07   CT Renal Stone Study  Result Date:  06/23/2021 CLINICAL DATA:  Right-sided flank pain for 2-3 days, history of prostate carcinoma EXAM: CT ABDOMEN AND PELVIS WITHOUT CONTRAST TECHNIQUE: Multidetector CT imaging of the abdomen and pelvis was performed following the standard protocol without IV contrast. COMPARISON:  01/15/2021 FINDINGS: Lower chest: Right lower lobe nodule is again seen and stable. Small effusion is noted. Mild dependent atelectatic changes are seen. Hepatobiliary: Gallbladder is within normal limits. Hepatic cysts are again seen and stable. Pancreas: Unremarkable. No pancreatic ductal dilatation or surrounding inflammatory changes. Spleen: Normal in size without focal abnormality. Adrenals/Urinary Tract: Adrenal glands are within normal limits. Kidneys show nonobstructing renal stone in the lower pole of the left kidney. Right renal cyst is seen stable in appearance from the prior CT. Bladder is partially distended. Ureters are within normal limits. Stomach/Bowel: Appendix is not well visualized. No inflammatory changes are seen to suggest appendicitis. Small bowel and stomach appear within normal limits. Vascular/Lymphatic: Aortic atherosclerosis. No enlarged abdominal or pelvic lymph nodes. Reproductive: Prostate is unremarkable. Other: No abdominal wall hernia or abnormality. No abdominopelvic ascites. Musculoskeletal: Degenerative changes of the lumbar spine are noted. IMPRESSION: Small right-sided pleural effusion is noted. Mild dependent atelectatic changes are seen. No focal confluent infiltrate is noted. Stable nonobstructing left lower pole renal stone. Stable nodule in the right lower lobe. Electronically Signed   By: Inez Catalina M.D.   On: 06/23/2021 13:50    ____________________________________________   PROCEDURES  Procedure(s) performed (including Critical Care):  .1-3 Lead EKG Interpretation  Date/Time: 06/24/2021 1:12 AM Performed by: Paulette Blanch, MD Authorized by: Paulette Blanch, MD     Interpretation:  normal     ECG rate:  85   ECG rate assessment: normal     Rhythm: sinus rhythm     Ectopy: none     Conduction: normal   Comments:     Patient placed on cardiac monitor to evaluate for arrhythmias   CRITICAL CARE Performed by: Paulette Blanch   Total critical care time: 30 minutes  Critical care time was exclusive of separately billable procedures and treating other patients.  Critical care was necessary to treat or prevent imminent or life-threatening deterioration.  Critical care was time spent personally by me on the following activities: development of treatment plan with patient and/or surrogate as well as nursing, discussions with consultants, evaluation of patient's response to treatment, examination of patient, obtaining history from patient or surrogate, ordering and performing treatments and interventions, ordering and review of laboratory studies, ordering and review of radiographic studies, pulse oximetry and re-evaluation of patient's condition.  ____________________________________________   INITIAL IMPRESSION / ASSESSMENT AND PLAN / ED COURSE  As part of my medical  decision making, I reviewed the following data within the [place] notes reviewed and incorporated, Labs reviewed, EKG interpreted, Old chart reviewed, Radiograph reviewed, Discussed with admitting physician, and Notes from prior ED visits     70 year old male referred to evaluate PE by his PCP for positive D-dimer. Differential includes, but is not limited to, viral syndrome, bronchitis including COPD exacerbation, pneumonia, reactive airway disease including asthma, CHF including exacerbation with or without pulmonary/interstitial edema, pneumothorax, ACS, thoracic trauma, and pulmonary embolism.   CTA positive for PE with developing pulmonary infarct, no right heart strain.  Heparin bolus and drip initiated.  IV analgesia administered.  Discussed case with hospitalist services for  admission.      ____________________________________________   FINAL CLINICAL IMPRESSION(S) / ED DIAGNOSES  Final diagnoses:  Other acute pulmonary embolism without acute cor pulmonale (HCC)  Nonspecific chest pain  Flank pain     ED Discharge Orders     None        Note:  This document was prepared using Dragon voice recognition software and may include unintentional dictation errors.    Paulette Blanch, MD 06/24/21 [phone]

## 2021-06-23 NOTE — ED Triage Notes (Signed)
Pt presents with right flank pain that radiates to abdomen, began on Sunday . Pain is worse with laying . Has H/O nephrostomy

## 2021-06-23 NOTE — Telephone Encounter (Signed)
Patient called the office stating that he went to the UC but does not know that he was seen by a doctor. Patient stated that he had test done and was told that they could not find anything that would be causing the symptoms that he is having. . Patient stated that last lady he talked to told him that he has some fluid in his right lung and was told that he should go to the ER to have test done to make sure that he does not have a blood clot. Patient stated that he is confused and wants to know what Dr. Danise Mina thinks.

## 2021-06-23 NOTE — Telephone Encounter (Addendum)
Depends on how he's feeling - if ongoing chest pain and shortness of breath, would agree with ER for evaluation as per [facility] recs as they last saw him. If pain/dyspnea has improved, could check stat D dimer here and if positive would need to go to ER, if negative we still don't have cause of pain so would offer soonest OV appt available for eval.

## 2021-06-23 NOTE — Telephone Encounter (Signed)
Is he going to come in for D dimer? I have ordered.  Shouldn't use aleve and advil together as these are both anti inflammatories. Recommend alternating tylenol 500-1000mg  with advil 400-600mg  Q4 hours PRN, add heating pad as needed.

## 2021-06-23 NOTE — ED Triage Notes (Signed)
Pt to ED by direction of Lukens, MD for rule out of PE due to elevated D-dimer. Per chart, pt had CT renal study today due to URQ abd pain/rib cage pain. Results read rt-sided pleural effusion and non-obstructing renal stone. Pt denies SOB. Recent COVID 2.5 weeks ago.

## 2021-06-23 NOTE — ED Notes (Signed)
Patient is being discharged from the Urgent Care and sent to the Emergency Department via POV . Per Christene Slates, [facility] patient is in need of higher level of care due to possible PE. Patient is aware and verbalizes understanding of plan of care.  Vitals:   06/23/21 0952  BP: 130/65  Pulse: 70  Resp: 18  Temp: 98.5 F (36.9 C)  SpO2: 99%

## 2021-06-23 NOTE — ED Provider Notes (Signed)
[facility]    CSN: [id number] Arrival date & time: 06/23/21  0093      History   Chief Complaint Chief Complaint  Patient presents with   Flank Pain    HPI Shane Feutz is a 70 y.o. male presenting for 3-day history of right flank and right mid back pain.  Pain is intermittent and sharp.  Pain does radiate to the right chest and shoulder as well as to the right upper abdomen.  Pain is worse with deep breathing and also with lying flat on his back.  He says pain is better throughout the day when he is up and moving around.  He denies any fever, fatigue, appetite changes, nausea/vomiting/diarrhea/constipation, dysuria, urinary frequency or urgency, hematuria.  No lower abdominal pain.  Patient does admit to chronic cough which is no worse than normal.  Additionally he says that he is "always short of breath but that is due to the multiple medications I have to take."  Patient is taking Eligard 30 mg every 4 months and Zytiga 1000 mg daily with prednisone 5 mg since nearly 2 years ago.  Patient does have history of prostate cancer with lymphadenopathy and bone disease diagnosed in July 2020.  Additionally he has history of hydronephrosis, pyelonephritis, and nephrostomy and stent placements.  Patient also has history of recurrent UTIs.  Patient also had DVT of the lower extremity.  Patient denies any recent injury but states about 3 months ago he fell onto his lawnmower and had a large bruise of his right but that resolved and he has not had any pain there for couple of months.  Personal history of COVID-19 2.5 weeks ago.  Patient never received any treatment for this.  No other complaints or concerns.  HPI  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 ([place]) 06/28/2019   Cystoscopy [name]) - 2 bladder tumors, 1 on anterior prostate planned TURBT/TURP 06/2019 Biopsy - Gleason 5+4=9 prostate cancer [facility] started 07/2019 planned  metastatic survey   History of chicken pox    Lipoma of neck    Seasonal allergies    Syncopal episodes 2007   x 2 s/p unrevealing hospitalization without recurrence    Patient Active Problem List   Diagnosis Date Noted   Atherosclerosis of aorta ([place]) 05/06/2021   CAD (coronary artery disease) 05/06/2021   History of DVT of lower extremity 02/25/2020   Hydronephrosis 02/13/2020   Loss of taste 01/31/2020   Bone metastases ([place]) 12/31/2019   Recurrent UTI 12/31/2019   Malignant neoplasm of prostate ([place]) 08/17/2019   Cancer involving bladder by direct extension from prostate ([place]) 06/28/2019   HLD (hyperlipidemia) 08/02/2018   Health maintenance examination 08/05/2017   Anosmia 08/05/2017   Family history of hemochromatosis 08/03/2016   Medicare annual wellness visit, subsequent 07/29/2015   Advanced care planning/counseling discussion 07/29/2015   Bradycardia 07/29/2015   History of cardiac monitoring 07/29/2015   Syncopal episodes     Past Surgical History:  Procedure Laterality Date   CYSTOSCOPY W/ RETROGRADES Bilateral 07/06/2019   Procedure: [place];  Surgeon: [name], MD;  Location: [facility];  Service: Urology;  Laterality: Bilateral;   EP IMPLANTABLE DEVICE N/A 02/25/2016   Procedure: Loop Recorder Removal;  Surgeon: Deboraha Sprang, MD;  Location: [facility];  Service: Cardiovascular;  Laterality: N/A;   Internal Cardiac Monitor  2007   due to syncopal episodes -  never contacted for f/u   IR NEPHROSTOMY PLACEMENT LEFT  02/14/2020   IR NEPHROSTOMY PLACEMENT RIGHT  02/14/2020   LIPOMA EXCISION N/A 02/09/2016   Procedure: EXCISION NECK LIPOMA;  Surgeon: Autumn Messing III, MD;  Location: [facility];  Service: General;  Laterality: N/A;   TRANSURETHRAL RESECTION OF BLADDER TUMOR N/A 07/06/2019   Procedure: TRANSURETHRAL RESECTION OF BLADDER TUMOR (TURBT);  Surgeon: [name], MD;   Location: [facility];  Service: Urology;  Laterality: N/A;       Home Medications    Prior to Admission medications   Medication Sig Start Date End Date Taking? Authorizing Provider  abiraterone acetate (ZYTIGA) 250 MG tablet Take 4 tablets (1,000 mg total) by mouth daily. Take on an empty stomach 1 hour before or 2 hours after a meal 08/05/20   Shadad, Mathis Dad, MD  Calcium-Magnesium-Vitamin D (CITRACAL SLOW RELEASE PO) Take 2 tablets by mouth daily.    [provider]  Mirabegron (MYRBETRIQ PO) Take by mouth daily.    [provider]  predniSONE (DELTASONE) 5 MG tablet TAKE ONE TABLET BY MOUTH DAILY WITH BREAKFAST 05/05/21   [name], MD  venlafaxine (EFFEXOR) 25 MG tablet Take 1 tablet (25 mg total) by mouth 2 (two) times daily. 05/06/21   [name], MD    Family History Family History  Problem Relation Age of Onset   Hyperlipidemia Mother    Cancer Mother        peritoneal cancer   Hemochromatosis Father        s/p liver transplant - pt screened negative 07/2015   Hemochromatosis Paternal Grandmother    Hemochromatosis Paternal Uncle    Stroke Neg Hx    Diabetes Neg Hx    CAD Neg Hx     Social History Social History   Tobacco Use   Smoking status: Never   Smokeless tobacco: Never  Vaping Use   Vaping Use: Never used  Substance Use Topics   Alcohol use: Yes    Alcohol/week: 0.0 standard drinks    Comment: 1 beer most evenings   Drug use: No     Allergies   Bactrim [sulfamethoxazole-trimethoprim], Hydrocodone, Percocet [oxycodone-acetaminophen], Vancomycin, Penicillins, and Zithromax [azithromycin]   Review of Systems Review of Systems  Constitutional:  Negative for appetite change, chills, diaphoresis, fatigue and fever.  Respiratory:  Positive for cough and shortness of breath.   Cardiovascular:  Positive for chest pain (right). Negative for palpitations and leg swelling.  Gastrointestinal:  Positive for abdominal pain (RUQ).  Negative for blood in stool, constipation, diarrhea, nausea and vomiting.  Genitourinary:  Positive for flank pain. Negative for dysuria, frequency and hematuria.  Musculoskeletal:  Positive for back pain. Negative for gait problem.  Skin:  Negative for color change and rash.  Neurological:  Negative for dizziness, weakness and numbness.    Physical Exam Triage Vital Signs ED Triage Vitals  Enc Vitals Group     BP 06/23/21 0952 130/65     Pulse Rate 06/23/21 0952 70     Resp 06/23/21 0952 18     Temp 06/23/21 0952 98.5 F (36.9 C)     Temp src --      SpO2 06/23/21 0952 99 %     Weight --      Height --      Head Circumference --      Peak Flow --      Pain Score 06/23/21 0950 8     Pain  Loc --      Pain Edu? --      Excl. in [place]? --    No data found.  Updated Vital Signs BP 130/65   Pulse 70   Temp 98.5 F (36.9 C)   Resp 18   SpO2 99%       Physical Exam Vitals and nursing note reviewed.  Constitutional:      General: He is not in acute distress.    Appearance: Normal appearance. He is well-developed. He is not ill-appearing.  HENT:     Head: Normocephalic and atraumatic.  Eyes:     General: No scleral icterus.    Conjunctiva/sclera: Conjunctivae normal.  Cardiovascular:     Rate and Rhythm: Normal rate and regular rhythm.     Heart sounds: Normal heart sounds.  Pulmonary:     Effort: Pulmonary effort is normal. No respiratory distress.     Breath sounds: Normal breath sounds. No wheezing, rhonchi or rales.  Chest:     Chest wall: No tenderness.  Abdominal:     General: Bowel sounds are normal.     Palpations: Abdomen is soft.     Tenderness: There is abdominal tenderness (slight TTP epigastric) in the epigastric area. There is right CVA tenderness (mild). There is no left CVA tenderness or rebound.  Musculoskeletal:     Cervical back: Neck supple.  Skin:    General: Skin is warm and dry.  Neurological:     General: No focal deficit present.      Mental Status: He is alert. Mental status is at baseline.     Motor: No weakness.     Gait: Gait normal.  Psychiatric:        Mood and Affect: Mood normal.        Behavior: Behavior normal.        Thought Content: Thought content normal.     UC Treatments / Results  Labs (all labs ordered are listed, but only abnormal results are displayed) Labs Reviewed  URINALYSIS, COMPLETE (UACMP) WITH MICROSCOPIC - Abnormal; Notable for the following components:      Result Value   APPearance HAZY (*)    Hgb urine dipstick TRACE (*)    Bacteria, UA FEW (*)    All other components within normal limits  CBC WITH DIFFERENTIAL/PLATELET - Abnormal; Notable for the following components:   RBC 3.94 (*)    Hemoglobin 12.5 (*)    HCT 36.7 (*)    Neutro Abs 7.9 (*)    Monocytes Absolute 1.2 (*)    Abs Immature Granulocytes 0.11 (*)    All other components within normal limits  COMPREHENSIVE METABOLIC PANEL - Abnormal; Notable for the following components:   Glucose, Bld 144 (*)    Total Bilirubin 1.3 (*)    All other components within normal limits  LIPASE, BLOOD    EKG   Radiology DG Chest 2 View  Result Date: 06/23/2021 CLINICAL DATA:  Chest pain, chronic cough EXAM: CHEST - 2 VIEW COMPARISON:  01/15/2021 FINDINGS: The heart size and mediastinal contours are within normal limits. Minimal atherosclerotic calcification of the aortic knob. No focal airspace consolidation, pleural effusion, or pneumothorax. The visualized skeletal structures are unremarkable. IMPRESSION: No active cardiopulmonary disease. Electronically Signed   By: Davina Poke D.O.   On: 06/23/2021 11:09   CT Renal Stone Study  Result Date: 06/23/2021 CLINICAL DATA:  Right-sided flank pain for 2-3 days, history of prostate carcinoma EXAM: CT ABDOMEN AND PELVIS WITHOUT  CONTRAST TECHNIQUE: Multidetector CT imaging of the abdomen and pelvis was performed following the standard protocol without IV contrast. COMPARISON:   01/15/2021 FINDINGS: Lower chest: Right lower lobe nodule is again seen and stable. Small effusion is noted. Mild dependent atelectatic changes are seen. Hepatobiliary: Gallbladder is within normal limits. Hepatic cysts are again seen and stable. Pancreas: Unremarkable. No pancreatic ductal dilatation or surrounding inflammatory changes. Spleen: Normal in size without focal abnormality. Adrenals/Urinary Tract: Adrenal glands are within normal limits. Kidneys show nonobstructing renal stone in the lower pole of the left kidney. Right renal cyst is seen stable in appearance from the prior CT. Bladder is partially distended. Ureters are within normal limits. Stomach/Bowel: Appendix is not well visualized. No inflammatory changes are seen to suggest appendicitis. Small bowel and stomach appear within normal limits. Vascular/Lymphatic: Aortic atherosclerosis. No enlarged abdominal or pelvic lymph nodes. Reproductive: Prostate is unremarkable. Other: No abdominal wall hernia or abnormality. No abdominopelvic ascites. Musculoskeletal: Degenerative changes of the lumbar spine are noted. IMPRESSION: Small right-sided pleural effusion is noted. Mild dependent atelectatic changes are seen. No focal confluent infiltrate is noted. Stable nonobstructing left lower pole renal stone. Stable nodule in the right lower lobe. Electronically Signed   By: Inez Catalina M.D.   On: 06/23/2021 13:50    Procedures Procedures (including critical care time)  Medications Ordered in UC Medications - No data to display  Initial Impression / Assessment and Plan / UC Course  I have reviewed the triage vital signs and the nursing notes.  Pertinent labs & imaging results that were available during my care of the patient were reviewed by me and considered in my medical decision making (see chart for details).   70 y/o male presenting for right flank/mid back pain that radiates to the right upper quadrant and sometimes to the right chest.   Pain is worse on deep breathing and lying flat.  Patient also reports chronic cough and shortness of breath.  He does have history of prostate cancer with bone metastasis.  Patient also has history of recurrent UTI, pyelonephritis and bilateral hydronephrosis with stent placement and previous nephrostomy.  DVT history as well. COVID 19 history 2.5 weeks ago.  All vital signs are normal and stable.  Patient is overall well-appearing.  On exam he has a soft abdomen with slight epigastric tenderness to palpation.  No TTP of right upper quadrant. Right CVA tenderness.  No chest tenderness either.  Chest is clear to auscultation heart regular rate and rhythm.  He is in no distress.  Chest x-ray ordered to assess for possible underlying pneumonia or any signs of masses or pleural effusion.  CBC, CMP, and lipase ordered as well as urinalysis.  DDx does include: Renal stone, hydronephrosis, pyelonephritis, cholecystitis/cholelithiasis/choledocholithiasis, pancreatitis, pneumonia, pleural effusion, lung malignancy, pulmonary embolism, muscle strain/muscle spasm, pain from bone metastasis.  Urinalysis shows trace blood and hazy appearance.  CBC shows slightly elevated neutrophil count and immature cells.  WBCs are normal.  CMP significant for elevated glucose at 144 but patient is not fasting.  Lipase is normal.  Chest x-ray within normal limits.  Reviewed this with patient.  We did discuss getting a CT scan to assess for possible renal stone.  Patient is agreeable to this plan.  I did review with patient that if his CT scan is negative for renal stone, shows significant obstruction or other severe findings or is negative then I may advise he goes to the emergency department for work-up for PE given all  of his risk factors.  Patient agrees.  CT scan over read states there is a small right-sided pleural effusion and a nonobstructing renal stone of the left kidney.  Additionally there is a stable nodule in the  right lower lobe.  Reviewed these findings with patient.  Advised patient that he needs work-up in the ED including D-dimer and CT of his chest.  Could be underlying pulmonary embolism or cancerous nodule with pleural effusion or even infectious pleural effusion.  Patient seems reluctant to go to the emergency department.  Advised him that it is imperative that he has work-up for PE ASAP.  Patient says that he will go home and eat and then will go to [facility] ED.  Patient leaving in stable condition.  Understands the risks of not going to the ED.   Final Clinical Impressions(s) / UC Diagnoses   Final diagnoses:  Right flank pain  Other microscopic hematuria  Right-sided chest pain  Pleural effusion  Prostate cancer ([place])  Bone metastasis ([place])     Discharge Instructions      Your CT scan shows some fluid in your right lung.  This in combination with the fact that you have right-sided chest and back pain is concerning.  You do have a lot of risk factors for pulmonary embolism including previous history of DVT, cancer and recent COVID.  I have advised you to follow-up in the emergency department today for evaluation of possible pulmonary embolism.     ED Prescriptions   None    PDMP not reviewed this encounter.   Danton Clap, PA-C 06/23/21 1419

## 2021-06-23 NOTE — Telephone Encounter (Signed)
Patient's wife notified as instructed by telephone and verbalized understanding. Patient's wife stated that her husband is leaving the house now to come to the office for lab work. Patient's wife stated that Dr. Danise Mina needs to know that her husband just told her that the pain occurs when he inhales.

## 2021-06-23 NOTE — Telephone Encounter (Signed)
Documentation from earlier office visit today reviewed.  There were concerns about possible pulmonary embolism.  Received call from answering service with critical results of a D-dimer.  I spoke to patient about the results.  Advised to go to emergency department tonight and have pulmonary embolism ruled out.

## 2021-06-23 NOTE — Telephone Encounter (Signed)
Patient notified as instructed by telephone and verbalized understanding. Patient's wife got on the phone and stated that he tested positive for covid about 3 weeks ago. Patient's wife stated he has been working in the garden and shelling beans a lot. . Patient's wife stated that the pain started 3 nights ago.  Patient's wife stated that he fell 3-4 months ago on the [employer]. Patient's wife stated when he fell he had a big bruise and fluid in the general area that he is now having the pain in. Patient's wife stated that he has been using aleve and Advil for the pain at night. Patient's wife stated that he took aleve last night and he did not sleep at all. Patient's wife stated that he only took one aleve last night. Patient denies chest pain or SOB. Patient stated that he was told that there is some fluid in his right lung and wants to know what he should do about that.  Patient stated that he is not having any pain now and that the pain usually occurs when he lays down at night to sleep. Patient's wife wants to know what he can take at night for the pain. Patient and his wife were given ER precautions and they verbalized understanding.

## 2021-06-23 NOTE — Discharge Instructions (Addendum)
Your CT scan shows some fluid in your right lung.  This in combination with the fact that you have right-sided chest and back pain is concerning.  You do have a lot of risk factors for pulmonary embolism including previous history of DVT, cancer and recent COVID.  I have advised you to follow-up in the emergency department today for evaluation of possible pulmonary embolism.

## 2021-06-23 NOTE — Telephone Encounter (Signed)
Records reviewed so far workup reassuring. Plz call tomorrow for update on symptoms.
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
## 2021-06-24 DIAGNOSIS — I2699 Other pulmonary embolism without acute cor pulmonale: Secondary | ICD-10-CM | POA: Diagnosis not present

## 2021-06-24 DIAGNOSIS — Z86718 Personal history of other venous thrombosis and embolism: Secondary | ICD-10-CM | POA: Diagnosis not present

## 2021-06-24 DIAGNOSIS — C7911 Secondary malignant neoplasm of bladder: Secondary | ICD-10-CM

## 2021-06-24 DIAGNOSIS — C61 Malignant neoplasm of prostate: Secondary | ICD-10-CM

## 2021-06-24 DIAGNOSIS — Z86711 Personal history of pulmonary embolism: Secondary | ICD-10-CM | POA: Diagnosis present

## 2021-06-24 LAB — CBC
HCT: 33.8 % — ABNORMAL LOW (ref 39.0–52.0)
Hemoglobin: 11.4 g/dL — ABNORMAL LOW (ref 13.0–17.0)
MCH: 32.6 pg (ref 26.0–34.0)
MCHC: 33.7 g/dL (ref 30.0–36.0)
MCV: 96.6 fL (ref 80.0–100.0)
Platelets: 198 10*3/uL (ref 150–400)
RBC: 3.5 MIL/uL — ABNORMAL LOW (ref 4.22–5.81)
RDW: 11.8 % (ref 11.5–15.5)
WBC: 9.6 10*3/uL (ref 4.0–10.5)
nRBC: 0 % (ref 0.0–0.2)

## 2021-06-24 LAB — APTT: aPTT: 32 seconds (ref 24–36)

## 2021-06-24 LAB — BASIC METABOLIC PANEL
Anion gap: 6 (ref 5–15)
BUN: 20 mg/dL (ref 8–23)
CO2: 26 mmol/L (ref 22–32)
Calcium: 9 mg/dL (ref 8.9–10.3)
Chloride: 103 mmol/L (ref 98–111)
Creatinine, Ser: 1.13 mg/dL (ref 0.61–1.24)
GFR, Estimated: >60 mL/min (ref >60)
Glucose, Bld: 114 mg/dL — ABNORMAL HIGH (ref 70–99)
Potassium: 3.9 mmol/L (ref 3.5–5.1)
Sodium: 135 mmol/L (ref 135–145)

## 2021-06-24 LAB — HIV ANTIBODY (ROUTINE TESTING W REFLEX): HIV Screen 4th Generation wRfx: NONREACTIVE

## 2021-06-24 LAB — TROPONIN I (HIGH SENSITIVITY): Troponin I (High Sensitivity): 8 ng/L (ref <18)

## 2021-06-24 LAB — RESP PANEL BY RT-PCR (FLU A&B, COVID) ARPGX2
Influenza A by PCR: NEGATIVE
Influenza B by PCR: NEGATIVE
SARS Coronavirus 2 by RT PCR: NEGATIVE

## 2021-06-24 LAB — HEPARIN LEVEL (UNFRACTIONATED): Heparin Unfractionated: 0.66 IU/mL (ref 0.30–0.70)

## 2021-06-24 LAB — PROTIME-INR
INR: 1.1 (ref 0.8–1.2)
Prothrombin Time: 13.9 seconds (ref 11.4–15.2)

## 2021-06-24 MED ORDER — ACETAMINOPHEN 650 MG RE SUPP: 650.0000 mg | Freq: Four times a day (QID) | RECTAL | Status: DC | PRN

## 2021-06-24 MED ORDER — APIXABAN 5 MG PO TABS: 5.0000 mg | ORAL_TABLET | Freq: Two times a day (BID) | ORAL | Status: DC

## 2021-06-24 MED ORDER — ABIRATERONE ACETATE 250 MG PO TABS: 1000.0000 mg | ORAL_TABLET | Freq: Every day | ORAL | Status: DC

## 2021-06-24 MED ORDER — APIXABAN (ELIQUIS) VTE STARTER PACK (10MG AND 5MG): ORAL_TABLET | ORAL | 0 refills | Status: DC

## 2021-06-24 MED ORDER — KETOROLAC TROMETHAMINE 30 MG/ML IJ SOLN
15.0000 mg | Freq: Three times a day (TID) | INTRAMUSCULAR | Status: DC | PRN
  Filled 2021-06-24: qty 1

## 2021-06-24 MED ORDER — HEPARIN (PORCINE) 25000 UT/250ML-% IV SOLN: 14.0000 [IU]/kg/h | INTRAVENOUS | Status: DC

## 2021-06-24 MED ORDER — FENTANYL CITRATE (PF) 100 MCG/2ML IJ SOLN: 50.0000 ug | Freq: Once | INTRAMUSCULAR | Status: DC

## 2021-06-24 MED ORDER — VENLAFAXINE HCL 25 MG PO TABS
25.0000 mg | ORAL_TABLET | Freq: Two times a day (BID) | ORAL | Status: DC
  Administered 2021-06-24: 25 mg via ORAL
  Filled 2021-06-24 (×2): qty 1

## 2021-06-24 MED ORDER — PREDNISONE 10 MG PO TABS
5.0000 mg | ORAL_TABLET | Freq: Every day | ORAL | Status: DC
  Administered 2021-06-24: 5 mg via ORAL
  Filled 2021-06-24: qty 1

## 2021-06-24 MED ORDER — ACETAMINOPHEN 325 MG PO TABS: 650.0000 mg | ORAL_TABLET | Freq: Four times a day (QID) | ORAL | Status: DC | PRN

## 2021-06-24 MED ORDER — POLYETHYLENE GLYCOL 3350 17 G PO PACK: 17.0000 g | PACK | Freq: Every day | ORAL | Status: DC | PRN

## 2021-06-24 MED ORDER — APIXABAN 5 MG PO TABS
10.0000 mg | ORAL_TABLET | Freq: Two times a day (BID) | ORAL | Status: DC
Start: 2021-06-24 — End: 2021-06-24
  Administered 2021-06-24: 10 mg via ORAL
  Filled 2021-06-24: qty 2

## 2021-06-24 MED ORDER — HEPARIN SODIUM (PORCINE) 5000 UNIT/ML IJ SOLN: 4000.0000 [IU] | Freq: Once | INTRAMUSCULAR | Status: DC

## 2021-06-24 MED ORDER — SODIUM CHLORIDE 0.9% FLUSH
3.0000 mL | Freq: Two times a day (BID) | INTRAVENOUS | Status: DC
  Administered 2021-06-24 (×2): 3 mL via INTRAVENOUS

## 2021-06-24 MED ORDER — HEPARIN (PORCINE) 25000 UT/250ML-% IV SOLN
1500.0000 [IU]/h | INTRAVENOUS | Status: DC
  Administered 2021-06-24: 1500 [IU]/h via INTRAVENOUS
  Filled 2021-06-24: qty 250

## 2021-06-24 MED ORDER — KETOROLAC TROMETHAMINE 30 MG/ML IJ SOLN
15.0000 mg | Freq: Four times a day (QID) | INTRAMUSCULAR | Status: DC | PRN
  Administered 2021-06-24: 15 mg via INTRAVENOUS

## 2021-06-24 MED ORDER — KETOROLAC TROMETHAMINE 30 MG/ML IJ SOLN: 15.0000 mg | INTRAMUSCULAR | Status: DC

## 2021-06-24 MED ORDER — MIRABEGRON ER 25 MG PO TB24
25.0000 mg | ORAL_TABLET | Freq: Every day | ORAL | Status: DC
  Administered 2021-06-24: 25 mg via ORAL
  Filled 2021-06-24: qty 1

## 2021-06-24 MED ORDER — HEPARIN BOLUS VIA INFUSION
5000.0000 [IU] | Freq: Once | INTRAVENOUS | Status: AC
  Administered 2021-06-24: 5000 [IU] via INTRAVENOUS
  Filled 2021-06-24: qty 5000

## 2021-06-24 NOTE — Progress Notes (Signed)
[place] for Apixaban Indication: pulmonary embolus  Patient Measurements: Height: 6' (182.9 cm) Weight: 86.6 kg (191 lb) IBW/kg (Calculated) : 77.6  Labs: Recent Labs    06/23/21 1014 06/23/21 2127 06/24/21 0158 06/24/21 0823  HGB 12.5* 12.5* 11.4*  --   HCT 36.7* 36.5* 33.8*  --   PLT 228 220 198  --   APTT  --   --  32  --   LABPROT  --   --  13.9  --   INR  --   --  1.1  --   HEPARINUNFRC  --   --   --  0.66  CREATININE 1.14 1.15 1.13  --   TROPONINIHS  --  7 8  --      Estimated Creatinine Clearance: 66.8 mL/min (by C-G formula based on SCr of 1.13 mg/dL).   Medical History: 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 ([place]) 06/28/2019   Cystoscopy [name]) - 2 bladder tumors, 1 on anterior prostate planned TURBT/TURP 06/2019 Biopsy - Gleason 5+4=9 prostate cancer [place] started 07/2019 planned metastatic survey   History of chicken pox    Lipoma of neck    Seasonal allergies    Syncopal episodes 2007   x 2 s/p unrevealing hospitalization without recurrence    Assessment: Patient is a 70 y/o M with medical history as above and including history of DVT not on anticoagulation prior to presentation who presented to the ED 7/19 with chest pain. Subsequently found to have acute PE. Patient was initiated on heparin infusion. Pharmacy now consulted to transition heparin infusion to apixaban for further treatment.    Plan:  --Stop heparin infusion --Start apixaban 10 mg BID x 7 days followed by apixaban 5 mg BID for remaining duration of treatment --CBC at least every 3 days per protocol  Benita Gutter 06/24/2021,10:26 AM

## 2021-06-24 NOTE — Progress Notes (Signed)
ANTICOAGULATION CONSULT NOTE - Initial Consult  Pharmacy Consult for Heparin  Indication: pulmonary embolus  Allergies  Allergen Reactions   Bactrim [Sulfamethoxazole-Trimethoprim] Hives    Red rash, hives   Hydrocodone    Percocet [Oxycodone-Acetaminophen] Other (See Comments)    TURNS RED FROM CHEST UP, LOOKS LIKE SUNBURN AND STARTS TO ITCH BADLY   Vancomycin    Penicillins Swelling and Rash    Did it involve swelling of the face/tongue/throat, SOB, or low BP? No Did it involve sudden or severe rash/hives, skin peeling, or any reaction on the inside of your mouth or nose? No Did you need to seek medical attention at [name] hospital or doctor's office? No When did it last happen?      50 years ago If all above answers are "NO", may proceed with cephalosporin use.    Zithromax [Azithromycin] Rash    Patient Measurements: Height: 6' (182.9 cm) Weight: 86.6 kg (191 lb) IBW/kg (Calculated) : 77.6 Heparin Dosing Weight: 86.6 kg   Vital Signs: Temp: 98.3 F (36.8 C) (07/19 2119) Temp Source: Oral (07/19 2119) BP: 140/82 (07/20 0730) Pulse Rate: 59 (07/20 0730)  Labs: Recent Labs    06/23/21 1014 06/23/21 2127 06/24/21 0158 06/24/21 0823  HGB 12.5* 12.5* 11.4*  --   HCT 36.7* 36.5* 33.8*  --   PLT 228 220 198  --   APTT  --   --  32  --   LABPROT  --   --  13.9  --   INR  --   --  1.1  --   HEPARINUNFRC  --   --   --  0.66  CREATININE 1.14 1.15 1.13  --   TROPONINIHS  --  7 8  --      Estimated Creatinine Clearance: 66.8 mL/min (by C-G formula based on SCr of 1.13 mg/dL).   Medical History: 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 ([place]) 06/28/2019   Cystoscopy [name]) - 2 bladder tumors, 1 on anterior prostate planned TURBT/TURP 06/2019 Biopsy - Gleason 5+4=9 prostate cancer [facility] started 07/2019 planned metastatic survey   History of chicken pox    Lipoma of neck    Seasonal allergies    Syncopal  episodes 2007   x 2 s/p unrevealing hospitalization without recurrence    Medications:  (Not in [name] hospital admission)  Assessment: Pharmacy consulted to dose heparin in this 70 year old male admitted with PE.  No prior anticoag noted.  CrCl = 65.6 ml/min   Goal of Therapy:  Heparin level 0.3-0.7 units/ml Monitor platelets by anticoagulation protocol: Yes   Plan:  7/20@0823 : HL 0.66, therapeutic x 1 Continue heparin infusion at 1500 units/hr Check confirmatory HL level in 6 hours and daily while on heparin Continue to monitor H&H and platelets  [name] Talayeh Bruinsma 06/24/2021,8:53 AM

## 2021-06-24 NOTE — Progress Notes (Signed)
Not noted in H&P is that patient reports testing positive for Covid at home 2 1/2 to 3 weeks ago. Repeat Covid test is pending in ED. Patient never had significant symptoms from Covid. He is outside the window for contact precautions based on when his symptoms began. Also increases risk for DVT/PE.  Pearson Grippe DO [facility]

## 2021-06-24 NOTE — Telephone Encounter (Signed)
Shane [name] - Client TELEPHONE ADVICE RECORD [employer] Patient Name: Shane [name] Gender: Male DOB: 16-Jan-1951 Age: 70 Y 10 M 21 D Return Phone Number: [phone] (Primary) Address: City/ State/ Zip: [place] [address] Client Shane [name] - Client Client Site [place] Physician [name] - MD Contact Type Call Who Is Calling Lab / Radiology Lab Name [facility] Lab Phone Number [phone] Lab Tech Name [place] Lab Reference Number [id number] S Chief Complaint Lab Result (Critical or Stat) Call Type Lab Send to RN Reason for Call Report lab results Initial Comment Caller states she has labs report. Translation No Nurse Assessment Nurse: Terence Lux, RN, Lana Date/Time (Eastern Time): 06/23/2021 7:11:43 PM Is there an on-call provider listed? ---Yes Please list name of person reporting value (Lab Employee) and a contact number. ---[facility] [phone] Please document the following items: Lab name Lab value (read back to lab to verify) Reference range for lab value Date and time blood was drawn Collect time of birth for bilirubin results ---D-dimer quatn 1.38 was ordered Stat ref range greater then 0.50 Collected 06/23/21 @ 1616 No previous results. Please collect the patient contact information from the lab. (name, phone number and address) ---Cherlynn June [phone] Disp. Time Eilene Ghazi Time) Disposition Final User 06/23/2021 7:30:19 PM Called On-Call Provider Terence Lux, RN, [place] 06/23/2021 7:31:07 PM Lab Call Terence Lux, RN, Lana Reason: lab report 06/23/2021 7:31:16 PM Clinical Call Yes Terence Lux, RN, Lana PLEASE NOTE: All timestamps contained within this report are represented as [place] Standard Time. CONFIDENTIALTY NOTICE: This fax transmission is intended only for the addressee. It contains information that is legally privileged, confidential  or otherwise protected from use or disclosure. If you are not the intended recipient, you are strictly prohibited from reviewing, disclosing, copying using or disseminating any of this information or taking any action in reliance on or regarding this information. If you have received this fax in error, please notify us immediately by telephone so that we can arrange for its return to [place]. Phone: [phone], Toll-Free: [phone], Fax: [phone] Page: 2 of 2 Call Id: [id number] Paging DoctorName Phone DateTime Result/ Outcome Message Type Notes Agustina Caroli- MD [phone] 06/23/2021 7:30:19 PM Called On Call Provider - Reached Doctor Paged Agustina Caroli- MD 06/23/2021 7:30:41 PM Spoke with On Call - General Message Result Lab value reported to OCP, no orders.

## 2021-06-24 NOTE — ED Notes (Signed)
Lab at the bedside 

## 2021-06-24 NOTE — Progress Notes (Signed)
ANTICOAGULATION CONSULT NOTE - Initial Consult  Pharmacy Consult for Heparin  Indication: pulmonary embolus  Allergies  Allergen Reactions   Bactrim [Sulfamethoxazole-Trimethoprim] Hives    Red rash, hives   Hydrocodone    Percocet [Oxycodone-Acetaminophen] Other (See Comments)    TURNS RED FROM CHEST UP, LOOKS LIKE SUNBURN AND STARTS TO ITCH BADLY   Vancomycin    Penicillins Swelling and Rash    Did it involve swelling of the face/tongue/throat, SOB, or low BP? No Did it involve sudden or severe rash/hives, skin peeling, or any reaction on the inside of your mouth or nose? No Did you need to seek medical attention at a hospital or doctor's office? No When did it last happen?      50 years ago If all above answers are "NO", may proceed with cephalosporin use.    Zithromax [Azithromycin] Rash    Patient Measurements: Height: 6' (182.9 cm) Weight: 86.6 kg (191 lb) IBW/kg (Calculated) : 77.6 Heparin Dosing Weight: 86.6 kg   Vital Signs: Temp: 98.3 F (36.8 C) (07/19 2119) Temp Source: Oral (07/19 2119) BP: 175/73 (07/19 2119) Pulse Rate: 82 (07/19 2119)  Labs: Recent Labs    06/23/21 1014 06/23/21 2127  HGB 12.5* 12.5*  HCT 36.7* 36.5*  PLT 228 220  CREATININE 1.14 1.15  TROPONINIHS  --  7    Estimated Creatinine Clearance: 65.6 mL/min (by C-G formula based on SCr of 1.15 mg/dL).   Medical History: 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 ([place]) 06/28/2019   Cystoscopy [name]) - 2 bladder tumors, 1 on anterior prostate planned TURBT/TURP 06/2019 Biopsy - Gleason 5+4=9 prostate cancer [facility] started 07/2019 planned metastatic survey   History of chicken pox    Lipoma of neck    Seasonal allergies    Syncopal episodes 2007   x 2 s/p unrevealing hospitalization without recurrence    Medications:  (Not in a hospital admission)   Assessment: Pharmacy consulted to dose heparin in this 70 year old male  admitted with PE.  No prior anticoag noted.  CrCl = 65.6 ml/min   Goal of Therapy:  Heparin level 0.3-0.7 units/ml Monitor platelets by anticoagulation protocol: Yes   Plan:  Give 5000 units bolus x 1 Start heparin infusion at 1500 units/hr Check anti-Xa level in 6 hours and daily while on heparin Continue to monitor H&H and platelets  Lorma Heater D 06/24/2021,1:31 AM

## 2021-06-24 NOTE — Discharge Summary (Signed)
Physician Discharge Summary   Shane [name]  male DOB: [birth year]  HYW:[id number]  PCP: [name], MD  Admit date: 06/23/2021 Discharge date: 06/24/2021  Admitted From: home Disposition:  home CODE STATUS: Full code  Discharge Instructions     Discharge instructions   Complete by: As directed    You have small blood clot in the right lower part of your lung.  You are started on Eliquis for blood thinner to treat the blood clot.  I prescribed 1 month with a coupon.  Please follow up with your PCP or oncologist for refills.  You should be on Eliquis for at least 3 months.   Dr. Enzo Bi [facility] Course:  For full details, please see H&P, progress notes, consult notes and ancillary notes.  Briefly,  Shane [name] is a 70 y.o. male with medical history significant of prostate cancer, CAD, hyperlipidemia, DVT who presents with 3 days of chest wall pain.  Patient has had 3 days of right lateral chest wall and flank pain.  Went to be evaluated with his PCP who worked him up with labs including a D-dimer and a CT renal stone study.  CT renal stone study showed only nonobstructing stone and small pleural effusion.  D-dimer was positive and he was sent to the ED for evaluation.  Acute pulmonary embolism > Does have a history of prior DVT in chart > CTA here shows segmental posterior right lower lobe PE without evidence of right heart strain with possible early pulmonary infarct. --Risk factors for PE include thrombogenic state from active cancer and recent COVID infection. --pt was started on heparin gtt on admission with improvement of his chest wall pain.   --Pt was transitioned to Eliquis the next day and discharged.  Pt was advised to continue the Rx with his PCP for at least 3 months.   Prostate cancer with mets > With bladder and bone involvement - Continue home Zytiga and prednisone - Continue home Myrbetriq   Discharge Diagnoses:  Principal  Problem:   Acute pulmonary embolism ([name]) Active Problems:   Cancer involving bladder by direct extension from prostate [facility])   Malignant neoplasm of prostate ([place])   History of DVT of lower extremity     Discharge Instructions:  Allergies as of 06/24/2021       Reactions   Bactrim [sulfamethoxazole-trimethoprim] Hives   Red rash, hives   Hydrocodone    Percocet [oxycodone-acetaminophen] Other (See Comments)   TURNS RED FROM CHEST UP, LOOKS LIKE SUNBURN AND STARTS TO ITCH BADLY   Vancomycin    Penicillins Swelling, Rash   Did it involve swelling of the face/tongue/throat, SOB, or low BP? No Did it involve sudden or severe rash/hives, skin peeling, or any reaction on the inside of your mouth or nose? No Did you need to seek medical attention at a hospital or doctor's office? No When did it last happen?      50 years ago If all above answers are "NO", may proceed with cephalosporin use.   Zithromax [azithromycin] Rash        Medication List     TAKE these medications    abiraterone acetate 250 MG tablet Commonly known as: Zytiga Take 4 tablets (1,000 mg total) by mouth daily. Take on an empty stomach 1 hour before or 2 hours after a meal   Apixaban Starter Pack (10mg  and 5mg ) Commonly known as: ELIQUIS STARTER PACK Take as directed on  package: start with two-5mg  tablets twice daily for 7 days. On day 8, switch to one-5mg  tablet twice daily.   CITRACAL SLOW RELEASE PO Take 2 tablets by mouth daily.   Eligard 30 MG injection Generic drug: Leuprolide Acetate (4 Month) Inject 30 mg into the skin every 4 (four) months.   MYRBETRIQ PO Take 1 tablet by mouth daily.   predniSONE 5 MG tablet Commonly known as: DELTASONE TAKE ONE TABLET BY MOUTH DAILY WITH BREAKFAST   venlafaxine 25 MG tablet Commonly known as: EFFEXOR Take 1 tablet (25 mg total) by mouth 2 (two) times daily.         Follow-up Information     [name], MD Follow up in 1 week(s).    Specialty: Family Medicine Why: To check labs (hemoglobin) and for Eliquis refills. Contact information: [place] [address] [phone]                 Allergies  Allergen Reactions   Bactrim [Sulfamethoxazole-Trimethoprim] Hives    Red rash, hives   Hydrocodone    Percocet [Oxycodone-Acetaminophen] Other (See Comments)    TURNS RED FROM CHEST UP, LOOKS LIKE SUNBURN AND STARTS TO ITCH BADLY   Vancomycin    Penicillins Swelling and Rash    Did it involve swelling of the face/tongue/throat, SOB, or low BP? No Did it involve sudden or severe rash/hives, skin peeling, or any reaction on the inside of your mouth or nose? No Did you need to seek medical attention at a hospital or doctor's office? No When did it last happen?      50 years ago If all above answers are "NO", may proceed with cephalosporin use.    Zithromax [Azithromycin] Rash     The results of significant diagnostics from this hospitalization (including imaging, microbiology, ancillary and laboratory) are listed below for reference.   Consultations:   Procedures/Studies: DG Chest 2 View  Result Date: 06/23/2021 CLINICAL DATA:  Chest pain, chronic cough EXAM: CHEST - 2 VIEW COMPARISON:  01/15/2021 FINDINGS: The heart size and mediastinal contours are within normal limits. Minimal atherosclerotic calcification of the aortic knob. No focal airspace consolidation, pleural effusion, or pneumothorax. The visualized skeletal structures are unremarkable. IMPRESSION: No active cardiopulmonary disease. Electronically Signed   By: Davina Poke D.O.   On: 06/23/2021 11:09   CT Angio Chest PE W and/or Wo Contrast  Result Date: 06/23/2021 CLINICAL DATA:  Upper abdominal/right rib pain, right pleural effusion on CT, elevated D-dimer EXAM: CT ANGIOGRAPHY CHEST WITH CONTRAST TECHNIQUE: Multidetector CT imaging of the chest was performed using the standard protocol during bolus administration of  intravenous contrast. Multiplanar CT image reconstructions and MIPs were obtained to evaluate the vascular anatomy. CONTRAST:  41mL OMNIPAQUE IOHEXOL 350 MG/ML SOLN COMPARISON:  Partial comparison to CT abdomen/pelvis dated 06/23/2021. CT chest dated 01/15/2021. FINDINGS: Cardiovascular: Satisfactory opacification of the bilateral pulmonary arteries to the segmental level. Lobar/segmental pulmonary embolism in the posterior right lower lobe (series 5/images 208, 221, 236, and 242). Overall clot burden is small. No evidence of right heart strain. Although not tailored for evaluation of the thoracic aorta, there is no evidence thoracic aortic aneurysm or dissection. Very mild atherosclerotic calcifications of the aortic arch. The heart is normal in size. Mild coronary atherosclerosis of the LAD. Mediastinum/Nodes: No suspicious mediastinal lymphadenopathy. Visualized thyroid is unremarkable. Lungs/Pleura: 6 mm right lower lobe pulmonary nodule (series 6/image 63), unchanged. Platelike scarring/atelectasis inferiorly in the right middle lobe and right lower  lobe. Superimposed ground-glass opacity in the posterior right lower lobe may reflect early/developing pulmonary infarct (series 6/image 74), equivocal. Trace right pleural effusion. No pneumothorax. Upper Abdomen: Visualized upper abdomen is better evaluated on recent CT. Musculoskeletal: Mild degenerative changes of the mid thoracic spine. Healed left anterior 3rd rib fracture (series 4/image 44). Review of the MIP images confirms the above findings. IMPRESSION: Lobar/segmental pulmonary embolism in the posterior right lower lobe. Overall clot burden is small. No evidence of right heart strain. Possible early/developing pulmonary infarct in the posterior right lower lobe, equivocal. Trace right pleural effusion. Aortic Atherosclerosis (ICD10-I70.0). Critical Value/emergent results were called by telephone at the time of interpretation on 06/23/2021 at 11:06 pm to  provider Conni Slipper , who verbally acknowledged these results. Electronically Signed   By: Julian Hy M.D.   On: 06/23/2021 23:07   CT Renal Stone Study  Result Date: 06/23/2021 CLINICAL DATA:  Right-sided flank pain for 2-3 days, history of prostate carcinoma EXAM: CT ABDOMEN AND PELVIS WITHOUT CONTRAST TECHNIQUE: Multidetector CT imaging of the abdomen and pelvis was performed following the standard protocol without IV contrast. COMPARISON:  01/15/2021 FINDINGS: Lower chest: Right lower lobe nodule is again seen and stable. Small effusion is noted. Mild dependent atelectatic changes are seen. Hepatobiliary: Gallbladder is within normal limits. Hepatic cysts are again seen and stable. Pancreas: Unremarkable. No pancreatic ductal dilatation or surrounding inflammatory changes. Spleen: Normal in size without focal abnormality. Adrenals/Urinary Tract: Adrenal glands are within normal limits. Kidneys show nonobstructing renal stone in the lower pole of the left kidney. Right renal cyst is seen stable in appearance from the prior CT. Bladder is partially distended. Ureters are within normal limits. Stomach/Bowel: Appendix is not well visualized. No inflammatory changes are seen to suggest appendicitis. Small bowel and stomach appear within normal limits. Vascular/Lymphatic: Aortic atherosclerosis. No enlarged abdominal or pelvic lymph nodes. Reproductive: Prostate is unremarkable. Other: No abdominal wall hernia or abnormality. No abdominopelvic ascites. Musculoskeletal: Degenerative changes of the lumbar spine are noted. IMPRESSION: Small right-sided pleural effusion is noted. Mild dependent atelectatic changes are seen. No focal confluent infiltrate is noted. Stable nonobstructing left lower pole renal stone. Stable nodule in the right lower lobe. Electronically Signed   By: Inez Catalina M.D.   On: 06/23/2021 13:50      Labs: BNP (last 3 results) No results for input(s): BNP in the last 8760  hours. Basic Metabolic Panel: Recent Labs  Lab 06/23/21 1014 06/23/21 2127 06/24/21 0158  NA 136 135 135  K 3.7 3.8 3.9  CL 103 100 103  CO2 25 26 26   GLUCOSE 144* 200* 114*  BUN 19 20 20   CREATININE 1.14 1.15 1.13  CALCIUM 9.5 9.4 9.0   Liver Function Tests: Recent Labs  Lab 06/23/21 1014  AST 31  ALT 31  ALKPHOS 56  BILITOT 1.3*  PROT 7.6  ALBUMIN 4.0   Recent Labs  Lab 06/23/21 1014  LIPASE 28   No results for input(s): AMMONIA in the last 168 hours. CBC: Recent Labs  Lab 06/23/21 1014 06/23/21 2127 06/24/21 0158  WBC 10.2 10.2 9.6  NEUTROABS 7.9*  --   --   HGB 12.5* 12.5* 11.4*  HCT 36.7* 36.5* 33.8*  MCV 93.1 94.1 96.6  PLT 228 220 198   Cardiac Enzymes: No results for input(s): CKTOTAL, CKMB, CKMBINDEX, TROPONINI in the last 168 hours. BNP: Invalid input(s): POCBNP CBG: No results for input(s): GLUCAP in the last 168 hours. D-Dimer Recent Labs  06/23/21 1616 06/23/21 2127  DDIMER 1.38* 1.39*   Hgb A1c No results for input(s): HGBA1C in the last 72 hours. Lipid Profile No results for input(s): CHOL, HDL, LDLCALC, TRIG, CHOLHDL, LDLDIRECT in the last 72 hours. Thyroid function studies No results for input(s): TSH, T4TOTAL, T3FREE, THYROIDAB in the last 72 hours.  Invalid input(s): FREET3 Anemia work up No results for input(s): VITAMINB12, FOLATE, FERRITIN, TIBC, IRON, RETICCTPCT in the last 72 hours. Urinalysis    Component Value Date/Time   COLORURINE YELLOW 06/23/2021 0956   APPEARANCEUR HAZY (A) 06/23/2021 0956   LABSPEC 1.025 06/23/2021 0956   PHURINE 6.0 06/23/2021 0956   GLUCOSEU NEGATIVE 06/23/2021 0956   HGBUR TRACE (A) 06/23/2021 0956   BILIRUBINUR NEGATIVE 06/23/2021 0956   BILIRUBINUR negative 02/18/2021 1211   BILIRUBINUR negative 11/10/2018 1025   KETONESUR NEGATIVE 06/23/2021 0956   PROTEINUR NEGATIVE 06/23/2021 0956   UROBILINOGEN 0.2 02/18/2021 1211   NITRITE NEGATIVE 06/23/2021 0956   LEUKOCYTESUR NEGATIVE  06/23/2021 0956   Sepsis Labs Invalid input(s): PROCALCITONIN,  WBC,  LACTICIDVEN Microbiology Recent Results (from the past 240 hour(s))  Resp Panel by RT-PCR (Flu A&B, Covid) Nasopharyngeal Swab     Status: None   Collection Time: 06/24/21  1:50 AM   Specimen: Nasopharyngeal Swab; Nasopharyngeal(NP) swabs in vial transport medium  Result Value Ref Range Status   SARS Coronavirus 2 by RT PCR NEGATIVE NEGATIVE Final    Comment: (NOTE) SARS-CoV-2 target nucleic acids are NOT DETECTED.  The SARS-CoV-2 RNA is generally detectable in upper respiratory specimens during the acute phase of infection. The lowest concentration of SARS-CoV-2 viral copies this assay can detect is 138 copies/mL. A negative result does not preclude SARS-Cov-2 infection and should not be used as the sole basis for treatment or other patient management decisions. A negative result may occur with  improper specimen collection/handling, submission of specimen other than nasopharyngeal swab, presence of viral mutation(s) within the areas targeted by this assay, and inadequate number of viral copies(<138 copies/mL). A negative result must be combined with clinical observations, patient history, and epidemiological information. The expected result is Negative.  Fact Sheet for Patients:  EntrepreneurPulse.com.au  Fact Sheet for Healthcare Providers:  IncredibleEmployment.be  This test is no t yet approved or cleared by the [place] FDA and  has been authorized for detection and/or diagnosis of SARS-CoV-2 by FDA under an Emergency Use Authorization (EUA). This EUA will remain  in effect (meaning this test can be used) for the duration of the COVID-19 declaration under Section 564(b)(1) of the Act, 21 U.S.C.section 360bbb-3(b)(1), unless the authorization is terminated  or revoked sooner.       Influenza A by PCR NEGATIVE NEGATIVE Final   Influenza B by PCR NEGATIVE  NEGATIVE Final    Comment: (NOTE) The Xpert Xpress SARS-CoV-2/FLU/RSV plus assay is intended as an aid in the diagnosis of influenza from Nasopharyngeal swab specimens and should not be used as a sole basis for treatment. Nasal washings and aspirates are unacceptable for Xpert Xpress SARS-CoV-2/FLU/RSV testing.  Fact Sheet for Patients: EntrepreneurPulse.com.au  Fact Sheet for Healthcare Providers: IncredibleEmployment.be  This test is not yet approved or cleared by the [place] FDA and has been authorized for detection and/or diagnosis of SARS-CoV-2 by FDA under an Emergency Use Authorization (EUA). This EUA will remain in effect (meaning this test can be used) for the duration of the COVID-19 declaration under Section 564(b)(1) of the Act, 21 U.S.C. section 360bbb-3(b)(1), unless the authorization is terminated or  revoked.  Performed at [facility], [address] [place], [place] [address]      Total time spend on discharging this patient, including the last patient exam, discussing the hospital stay, instructions for ongoing care as it relates to all pertinent caregivers, as well as preparing the medical discharge records, prescriptions, and/or referrals as applicable, is 45 minutes.    Enzo Bi, MD  [facility] 06/24/2021, 11:52 AM

## 2021-06-24 NOTE — Telephone Encounter (Signed)
Per chart review tab pt was admitted to [facility] ED with pulmonary embolism.

## 2021-06-24 NOTE — H&P (Signed)
History and Physical   Rockland Kotarski QAS:[id number] DOB: [birth year] DOA: 06/23/2021  PCP: [name], MD   Patient coming from: Home/PCP office  Chief Complaint: Chest wall pain  HPI: Shane [name] is a 70 y.o. male with medical history significant of prostate cancer, CAD, hyperlipidemia, DVT who presents with 3 days of chest wall pain. Patient has had 3 days of right lateral chest wall and flank pain.  Went to be evaluated with his PCP who worked him up with labs including a D-dimer and a CT renal stone study.  CT renal stone study showed only nonobstructing stone and small pleural effusion.  D-dimer was positive and he was sent to the ED for evaluation. Patient states his pain is worse with deep inspiration. He has known history of prostate cancer.  He denies any immobility nor trauma nor recent surgery.  He denies fevers, chills, shortness of breath, chest pain, abdominal pain, constipation, diarrhea, nausea, vomiting.  ED Course: Vital signs significant for blood pressure in the 798X to 211 systolic and respirations in the teens to 20s.  Lab work-up showed CMP with glucose 144 and T bili 1.3.  CBC showed hemoglobin stable at 12.5.  PT, PTT, INR pending.  Lipase normal pending D-dimer mildly elevated 1.38, troponin negative with repeat pending.  Respiratory panel for flu COVID pending.  Urinalysis without evidence of UTI or acute abnormality.  Chest x-ray without acute abnormality recently.  CT renal stone performed recently showed small right pleural effusion and nonobstructive left renal stone.  CT PE study showed segmental right posterior lower lobe PE with no evidence of right heart strain and possible early pulmonary infarct evidence.  Patient started on heparin drip in the ED.  Review of Systems: As per HPI otherwise all other systems reviewed and are negative.  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 ([place]) 06/28/2019    Cystoscopy [name]) - 2 bladder tumors, 1 on anterior prostate planned TURBT/TURP 06/2019 Biopsy - Gleason 5+4=9 prostate cancer [facility] started 07/2019 planned metastatic survey   History of chicken pox    Lipoma of neck    Seasonal allergies    Syncopal episodes 2007   x 2 s/p unrevealing hospitalization without recurrence    Past Surgical History:  Procedure Laterality Date   CYSTOSCOPY W/ RETROGRADES Bilateral 07/06/2019   Procedure: [place];  Surgeon: [name], MD;  Location: [facility];  Service: Urology;  Laterality: Bilateral;   EP IMPLANTABLE DEVICE N/A 02/25/2016   Procedure: Loop Recorder Removal;  Surgeon: Deboraha Sprang, MD;  Location: [place] [facility] [facility];  Service: Cardiovascular;  Laterality: N/A;   Internal Cardiac Monitor  2007   due to syncopal episodes - never contacted for f/u   IR NEPHROSTOMY PLACEMENT LEFT  02/14/2020   IR NEPHROSTOMY PLACEMENT RIGHT  02/14/2020   LIPOMA EXCISION N/A 02/09/2016   Procedure: EXCISION NECK LIPOMA;  Surgeon: Autumn Messing III, MD;  Location: [facility];  Service: General;  Laterality: N/A;   TRANSURETHRAL RESECTION OF BLADDER TUMOR N/A 07/06/2019   Procedure: TRANSURETHRAL RESECTION OF BLADDER TUMOR (TURBT);  Surgeon: [name], MD;  Location: [facility];  Service: Urology;  Laterality: N/A;    Social History  reports that he has never smoked. He has never used smokeless tobacco. He reports current alcohol use. He reports that he does not use drugs.  Allergies  Allergen Reactions   Bactrim [Sulfamethoxazole-Trimethoprim] Hives  Red rash, hives   Hydrocodone    Percocet [Oxycodone-Acetaminophen] Other (See Comments)    TURNS RED FROM CHEST UP, LOOKS LIKE SUNBURN AND STARTS TO ITCH BADLY   Vancomycin    Penicillins Swelling and Rash    Did it involve swelling of the face/tongue/throat, SOB, or low BP? No Did it involve sudden or severe rash/hives, skin  peeling, or any reaction on the inside of your mouth or nose? No Did you need to seek medical attention at a hospital or doctor's office? No When did it last happen?      50 years ago If all above answers are "NO", may proceed with cephalosporin use.    Zithromax [Azithromycin] Rash    Family History  Problem Relation Age of Onset   Hyperlipidemia Mother    Cancer Mother        peritoneal cancer   Hemochromatosis Father        s/p liver transplant - pt screened negative 07/2015   Hemochromatosis Paternal Grandmother    Hemochromatosis Paternal Uncle    Stroke Neg Hx    Diabetes Neg Hx    CAD Neg Hx   Reviewed on admission  Prior to Admission medications   Medication Sig Start Date End Date Taking? Authorizing Provider  abiraterone acetate (ZYTIGA) 250 MG tablet Take 4 tablets (1,000 mg total) by mouth daily. Take on an empty stomach 1 hour before or 2 hours after a meal 08/05/20   Shadad, Mathis Dad, MD  Calcium-Magnesium-Vitamin D (CITRACAL SLOW RELEASE PO) Take 2 tablets by mouth daily.    [provider]  Mirabegron (MYRBETRIQ PO) Take by mouth daily.    [provider]  predniSONE (DELTASONE) 5 MG tablet TAKE ONE TABLET BY MOUTH DAILY WITH BREAKFAST 05/05/21   [name], MD  venlafaxine (EFFEXOR) 25 MG tablet Take 1 tablet (25 mg total) by mouth 2 (two) times daily. 05/06/21   [name], MD    Physical Exam: Vitals:   06/23/21 2119  BP: (!) 175/73  Pulse: 82  Resp: 18  Temp: 98.3 F (36.8 C)  TempSrc: Oral  SpO2: 97%  Weight: 86.6 kg  Height: 6' (1.829 m)   Physical Exam Constitutional:      General: He is not in acute distress.    Appearance: Normal appearance.  HENT:     Head: Normocephalic and atraumatic.     Mouth/Throat:     Mouth: Mucous membranes are moist.     Pharynx: Oropharynx is clear.  Eyes:     Extraocular Movements: Extraocular movements intact.     Pupils: Pupils are equal, round, and reactive to light.   Cardiovascular:     Rate and Rhythm: Normal rate and regular rhythm.     Pulses: Normal pulses.     Heart sounds: Normal heart sounds.  Pulmonary:     Effort: Pulmonary effort is normal. No respiratory distress.     Breath sounds: Normal breath sounds.     Comments: Right lateral chest wall pain Abdominal:     General: Bowel sounds are normal. There is no distension.     Palpations: Abdomen is soft.     Tenderness: There is no abdominal tenderness.  Musculoskeletal:        General: No swelling or deformity.  Skin:    General: Skin is warm and dry.  Neurological:     General: No focal deficit present.     Mental Status: Mental status is at baseline.   Labs  on Admission: I have personally reviewed following labs and imaging studies  CBC: Recent Labs  Lab 06/23/21 1014 06/23/21 2127  WBC 10.2 10.2  NEUTROABS 7.9*  --   HGB 12.5* 12.5*  HCT 36.7* 36.5*  MCV 93.1 94.1  PLT 228 950    Basic Metabolic Panel: Recent Labs  Lab 06/23/21 1014 06/23/21 2127  NA 136 135  K 3.7 3.8  CL 103 100  CO2 25 26  GLUCOSE 144* 200*  BUN 19 20  CREATININE 1.14 1.15  CALCIUM 9.5 9.4    GFR: Estimated Creatinine Clearance: 65.6 mL/min (by C-G formula based on SCr of 1.15 mg/dL).  Liver Function Tests: Recent Labs  Lab 06/23/21 1014  AST 31  ALT 31  ALKPHOS 56  BILITOT 1.3*  PROT 7.6  ALBUMIN 4.0    Urine analysis:    Component Value Date/Time   COLORURINE YELLOW 06/23/2021 0956   APPEARANCEUR HAZY (A) 06/23/2021 0956   LABSPEC 1.025 06/23/2021 0956   PHURINE 6.0 06/23/2021 0956   GLUCOSEU NEGATIVE 06/23/2021 0956   HGBUR TRACE (A) 06/23/2021 0956   BILIRUBINUR NEGATIVE 06/23/2021 0956   BILIRUBINUR negative 02/18/2021 1211   BILIRUBINUR negative 11/10/2018 1025   KETONESUR NEGATIVE 06/23/2021 0956   PROTEINUR NEGATIVE 06/23/2021 0956   UROBILINOGEN 0.2 02/18/2021 1211   NITRITE NEGATIVE 06/23/2021 0956   LEUKOCYTESUR NEGATIVE 06/23/2021 0956    Radiological  Exams on Admission: DG Chest 2 View  Result Date: 06/23/2021 CLINICAL DATA:  Chest pain, chronic cough EXAM: CHEST - 2 VIEW COMPARISON:  01/15/2021 FINDINGS: The heart size and mediastinal contours are within normal limits. Minimal atherosclerotic calcification of the aortic knob. No focal airspace consolidation, pleural effusion, or pneumothorax. The visualized skeletal structures are unremarkable. IMPRESSION: No active cardiopulmonary disease. Electronically Signed   By: Davina Poke D.O.   On: 06/23/2021 11:09   CT Angio Chest PE W and/or Wo Contrast  Result Date: 06/23/2021 CLINICAL DATA:  Upper abdominal/right rib pain, right pleural effusion on CT, elevated D-dimer EXAM: CT ANGIOGRAPHY CHEST WITH CONTRAST TECHNIQUE: Multidetector CT imaging of the chest was performed using the standard protocol during bolus administration of intravenous contrast. Multiplanar CT image reconstructions and MIPs were obtained to evaluate the vascular anatomy. CONTRAST:  63mL OMNIPAQUE IOHEXOL 350 MG/ML SOLN COMPARISON:  Partial comparison to CT abdomen/pelvis dated 06/23/2021. CT chest dated 01/15/2021. FINDINGS: Cardiovascular: Satisfactory opacification of the bilateral pulmonary arteries to the segmental level. Lobar/segmental pulmonary embolism in the posterior right lower lobe (series 5/images 208, 221, 236, and 242). Overall clot burden is small. No evidence of right heart strain. Although not tailored for evaluation of the thoracic aorta, there is no evidence thoracic aortic aneurysm or dissection. Very mild atherosclerotic calcifications of the aortic arch. The heart is normal in size. Mild coronary atherosclerosis of the LAD. Mediastinum/Nodes: No suspicious mediastinal lymphadenopathy. Visualized thyroid is unremarkable. Lungs/Pleura: 6 mm right lower lobe pulmonary nodule (series 6/image 63), unchanged. Platelike scarring/atelectasis inferiorly in the right middle lobe and right lower lobe. Superimposed  ground-glass opacity in the posterior right lower lobe may reflect early/developing pulmonary infarct (series 6/image 74), equivocal. Trace right pleural effusion. No pneumothorax. Upper Abdomen: Visualized upper abdomen is better evaluated on recent CT. Musculoskeletal: Mild degenerative changes of the mid thoracic spine. Healed left anterior 3rd rib fracture (series 4/image 44). Review of the MIP images confirms the above findings. IMPRESSION: Lobar/segmental pulmonary embolism in the posterior right lower lobe. Overall clot burden is small. No evidence of right heart strain.  Possible early/developing pulmonary infarct in the posterior right lower lobe, equivocal. Trace right pleural effusion. Aortic Atherosclerosis (ICD10-I70.0). Critical Value/emergent results were called by telephone at the time of interpretation on 06/23/2021 at 11:06 pm to provider Conni Slipper , who verbally acknowledged these results. Electronically Signed   By: Julian Hy M.D.   On: 06/23/2021 23:07   CT Renal Stone Study  Result Date: 06/23/2021 CLINICAL DATA:  Right-sided flank pain for 2-3 days, history of prostate carcinoma EXAM: CT ABDOMEN AND PELVIS WITHOUT CONTRAST TECHNIQUE: Multidetector CT imaging of the abdomen and pelvis was performed following the standard protocol without IV contrast. COMPARISON:  01/15/2021 FINDINGS: Lower chest: Right lower lobe nodule is again seen and stable. Small effusion is noted. Mild dependent atelectatic changes are seen. Hepatobiliary: Gallbladder is within normal limits. Hepatic cysts are again seen and stable. Pancreas: Unremarkable. No pancreatic ductal dilatation or surrounding inflammatory changes. Spleen: Normal in size without focal abnormality. Adrenals/Urinary Tract: Adrenal glands are within normal limits. Kidneys show nonobstructing renal stone in the lower pole of the left kidney. Right renal cyst is seen stable in appearance from the prior CT. Bladder is partially distended.  Ureters are within normal limits. Stomach/Bowel: Appendix is not well visualized. No inflammatory changes are seen to suggest appendicitis. Small bowel and stomach appear within normal limits. Vascular/Lymphatic: Aortic atherosclerosis. No enlarged abdominal or pelvic lymph nodes. Reproductive: Prostate is unremarkable. Other: No abdominal wall hernia or abnormality. No abdominopelvic ascites. Musculoskeletal: Degenerative changes of the lumbar spine are noted. IMPRESSION: Small right-sided pleural effusion is noted. Mild dependent atelectatic changes are seen. No focal confluent infiltrate is noted. Stable nonobstructing left lower pole renal stone. Stable nodule in the right lower lobe. Electronically Signed   By: Inez Catalina M.D.   On: 06/23/2021 13:50    EKG: Independently reviewed. Normal sinus rhythm at 82 bpm.  Some nonspecific T wave flattening.  TPN as in lead I and Q waves with T wave flattening in lead III likely consistent with known PE.wed.  Assessment/Plan Principal Problem:   Acute pulmonary embolism (HCC) Active Problems:   Cancer involving bladder by direct extension from prostate [facility])   Malignant neoplasm of prostate ([place])   History of DVT of lower extremity  Acute pulmonary embolism > Does have a history of prior DVT in chart > Patient with 3 days of right lateral chest wall and flank pain.  Had presented to his PCP who got a renal stone study and a D-dimer which was elevated.  In setting of prostate cancer.   > CTA here shows segmental posterior right lower lobe PE without evidence of right heart strain with possible early pulmonary infarct. -Monitor on telemetry -Continue heparin drip -Pain control with Toradol considering previous allergy to opioid   Prostate cancer > With bladder and bone involvement - Continue home Zytiga and prednisone - Continue home Myrbetriq  DVT prophylaxis: Heparin  Code Status:   Full  Family Communication:  Patient states his wife is aware  he is here and he will call her and update her.  He he states he does not need me to update his family. Disposition Plan:   Patient is from:  Home  Anticipated DC to:  Home  Anticipated DC date:  1 to 3 days  Anticipated DC barriers: None  Consults called:  None  Admission status:  Observation, telemetry   Severity of Illness: The appropriate patient status for this patient is OBSERVATION. Observation status is judged to be reasonable and  necessary in order to provide the required intensity of service to ensure the patient's safety. The patient's presenting symptoms, physical exam findings, and initial radiographic and laboratory data in the context of their medical condition is felt to place them at decreased risk for further clinical deterioration. Furthermore, it is anticipated that the patient will be medically stable for discharge from the hospital within 2 midnights of admission. The following factors support the patient status of observation.   " The patient's presenting symptoms include right lateral chest wall pain and flank pain. " The physical exam findings include right lateral chest wall pain. " The initial radiographic and laboratory data are CMP with glucose 144 and T bili 1.3.  CBC showed hemoglobin stable at 12.5.  PT, PTT, INR pending.  Lipase normal pending D-dimer mildly elevated 1.38, troponin negative with repeat pending.  Respiratory panel for flu COVID pending.  Urinalysis without evidence of UTI or acute abnormality.  Chest x-ray without acute abnormality recently.  CT renal stone performed recently showed small right pleural effusion and nonobstructive left renal stone.  CT PE study showed segmental right posterior lower lobe PE with no evidence of right heart strain and possible early pulmonary infarct evidence.   Marcelyn Bruins MD [facility]  How to contact the [facility] Attending or Consulting provider [place] or covering provider during after hours [place], for  this patient?   Check the care team in [facility] and look for a) attending/consulting [facility] provider listed and b) the [facility] team listed Log into [email or web address] and use [place]'s universal password to access. If you do not have the password, please contact the hospital operator. Locate the [facility] provider you are looking for under [facility] and page to a number that you can be directly reached. If you still have difficulty reaching the provider, please page the [facility] (Director on Call) for the Hospitalists listed on amion for assistance.  06/24/2021, 1:27 AM

## 2021-06-25 ENCOUNTER — Emergency Department (HOSPITAL_COMMUNITY)
Admission: EM | Admit: 2021-06-25 | Discharge: 2021-06-25 | Disposition: A | Discharge status: Home / Self Care | Payer: PPO | Attending: Emergency Medicine | Admitting: Emergency Medicine

## 2021-06-25 ENCOUNTER — Emergency Department (HOSPITAL_COMMUNITY): Payer: PPO

## 2021-06-25 ENCOUNTER — Telehealth: Payer: Self-pay

## 2021-06-25 ENCOUNTER — Encounter (HOSPITAL_COMMUNITY): Payer: Self-pay | Admitting: *Deleted

## 2021-06-25 DIAGNOSIS — R109 Unspecified abdominal pain: Secondary | ICD-10-CM | POA: Diagnosis not present

## 2021-06-25 DIAGNOSIS — I2510 Atherosclerotic heart disease of native coronary artery without angina pectoris: Secondary | ICD-10-CM | POA: Insufficient documentation

## 2021-06-25 DIAGNOSIS — R091 Pleurisy: Secondary | ICD-10-CM | POA: Diagnosis not present

## 2021-06-25 DIAGNOSIS — Z85830 Personal history of malignant neoplasm of bone: Secondary | ICD-10-CM | POA: Insufficient documentation

## 2021-06-25 DIAGNOSIS — J9811 Atelectasis: Secondary | ICD-10-CM | POA: Diagnosis not present

## 2021-06-25 DIAGNOSIS — Z7901 Long term (current) use of anticoagulants: Secondary | ICD-10-CM | POA: Diagnosis not present

## 2021-06-25 DIAGNOSIS — R0781 Pleurodynia: Secondary | ICD-10-CM

## 2021-06-25 DIAGNOSIS — Z8546 Personal history of malignant neoplasm of prostate: Secondary | ICD-10-CM | POA: Insufficient documentation

## 2021-06-25 DIAGNOSIS — J90 Pleural effusion, not elsewhere classified: Secondary | ICD-10-CM | POA: Diagnosis not present

## 2021-06-25 DIAGNOSIS — R079 Chest pain, unspecified: Secondary | ICD-10-CM | POA: Diagnosis not present

## 2021-06-25 LAB — COMPREHENSIVE METABOLIC PANEL
ALT: 22 U/L (ref 0–44)
AST: 22 U/L (ref 15–41)
Albumin: 3.4 g/dL — ABNORMAL LOW (ref 3.5–5.0)
Alkaline Phosphatase: 48 U/L (ref 38–126)
Anion gap: 6 (ref 5–15)
BUN: 20 mg/dL (ref 8–23)
CO2: 27 mmol/L (ref 22–32)
Calcium: 9.5 mg/dL (ref 8.9–10.3)
Chloride: 105 mmol/L (ref 98–111)
Creatinine, Ser: 1.1 mg/dL (ref 0.61–1.24)
GFR, Estimated: >60 mL/min (ref >60)
Glucose, Bld: 107 mg/dL — ABNORMAL HIGH (ref 70–99)
Potassium: 3.7 mmol/L (ref 3.5–5.1)
Sodium: 138 mmol/L (ref 135–145)
Total Bilirubin: 0.7 mg/dL (ref 0.3–1.2)
Total Protein: 6.7 g/dL (ref 6.5–8.1)

## 2021-06-25 LAB — TROPONIN I (HIGH SENSITIVITY): Troponin I (High Sensitivity): 6 ng/L (ref <18)

## 2021-06-25 LAB — CBC WITH DIFFERENTIAL/PLATELET
Abs Immature Granulocytes: 0.16 10*3/uL — ABNORMAL HIGH (ref 0.00–0.07)
Basophils Absolute: 0 10*3/uL (ref 0.0–0.1)
Basophils Relative: 0 %
Eosinophils Absolute: 0.4 10*3/uL (ref 0.0–0.5)
Eosinophils Relative: 6 %
HCT: 36 % — ABNORMAL LOW (ref 39.0–52.0)
Hemoglobin: 12 g/dL — ABNORMAL LOW (ref 13.0–17.0)
Immature Granulocytes: 2 %
Lymphocytes Relative: 11 %
Lymphs Abs: 0.9 10*3/uL (ref 0.7–4.0)
MCH: 32 pg (ref 26.0–34.0)
MCHC: 33.3 g/dL (ref 30.0–36.0)
MCV: 96 fL (ref 80.0–100.0)
Monocytes Absolute: 0.9 10*3/uL (ref 0.1–1.0)
Monocytes Relative: 11 %
Neutro Abs: 5.7 10*3/uL (ref 1.7–7.7)
Neutrophils Relative %: 70 %
Platelets: 220 10*3/uL (ref 150–400)
RBC: 3.75 MIL/uL — ABNORMAL LOW (ref 4.22–5.81)
RDW: 11.8 % (ref 11.5–15.5)
WBC: 8 10*3/uL (ref 4.0–10.5)
nRBC: 0 % (ref 0.0–0.2)

## 2021-06-25 IMAGING — CR DG CHEST 2V
2 series · 2 of 2 positions shown · non-contrast
Comparison: [DATE].

CLINICAL DATA: Chest pain.

EXAM:
CHEST - 2 VIEW

[w chest pa]
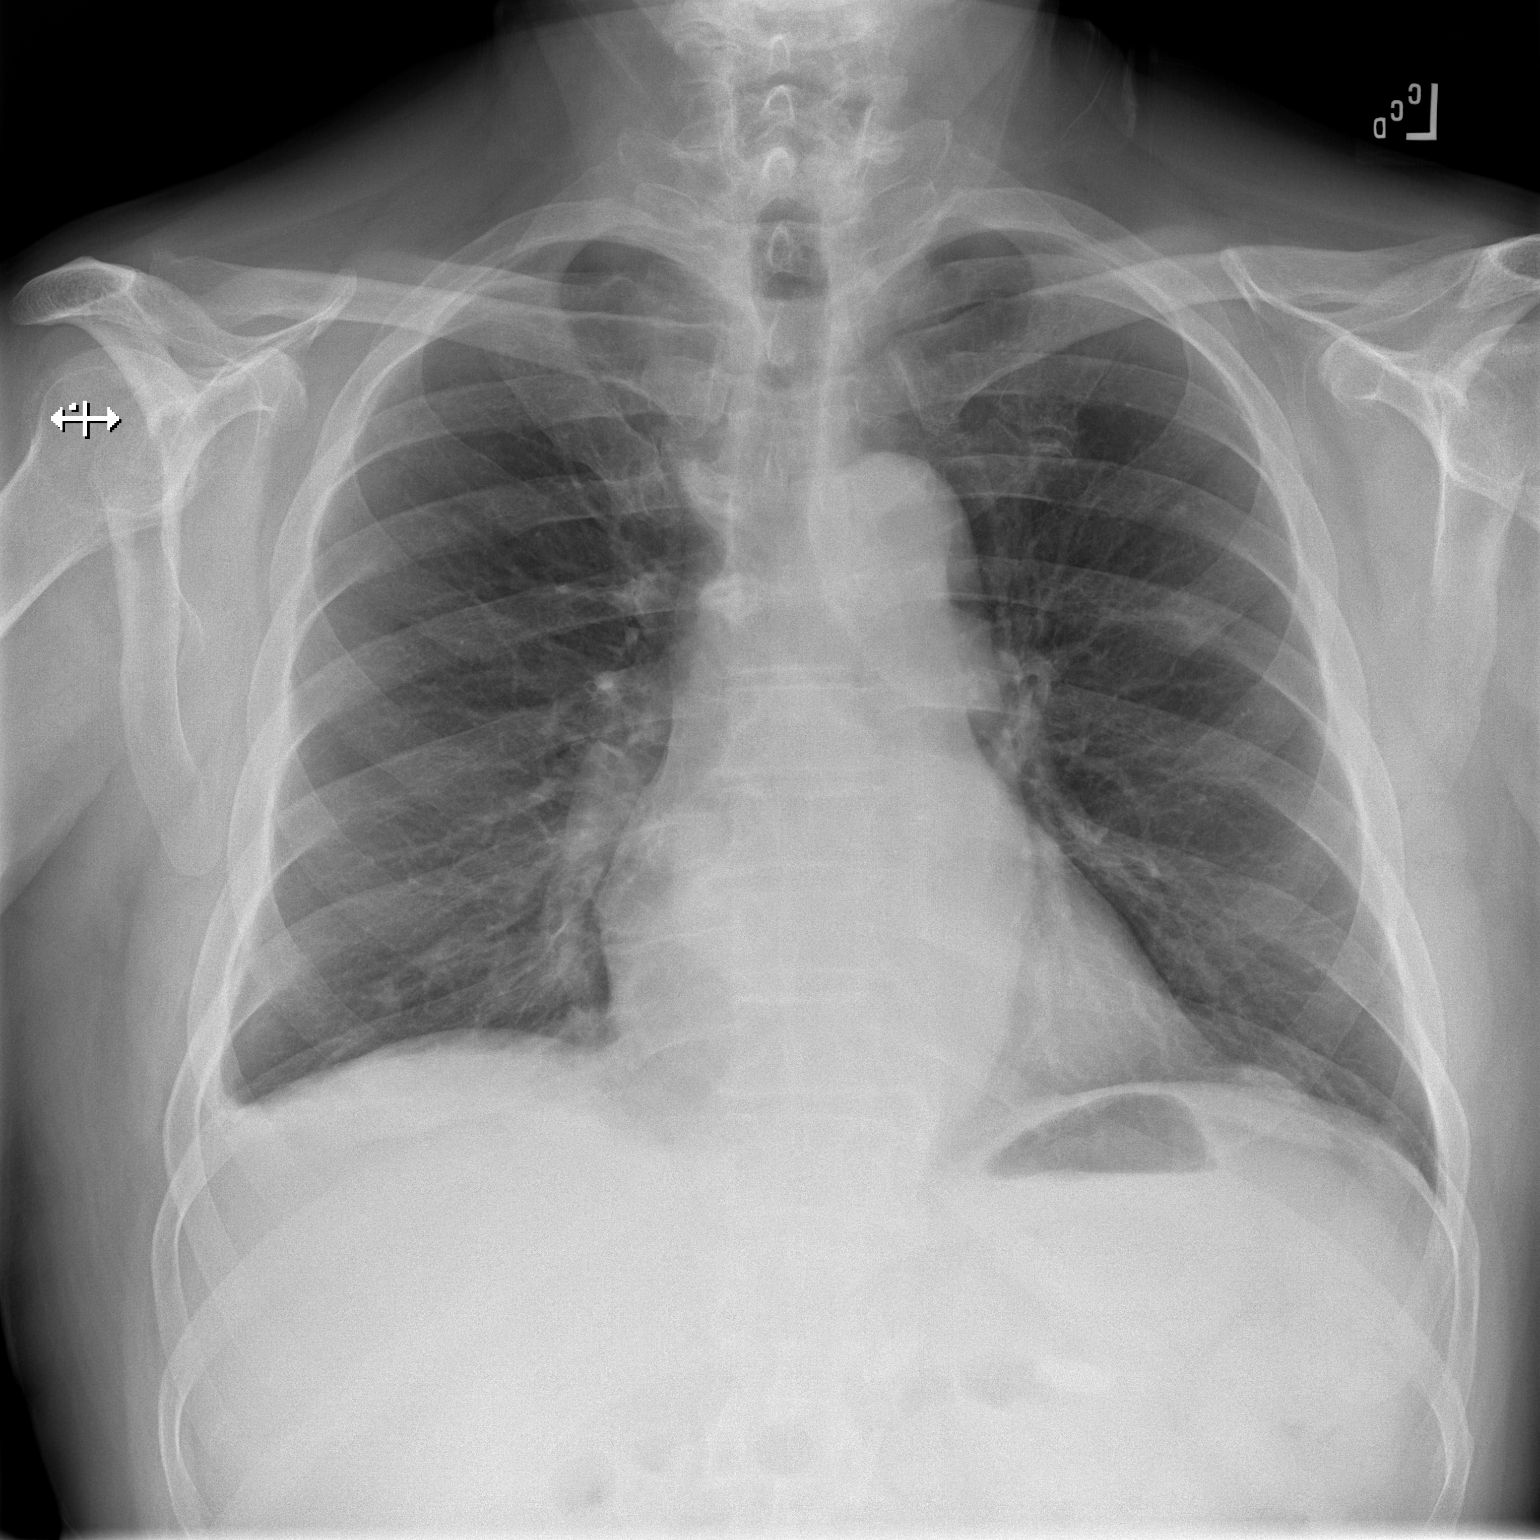

[w chest lat]
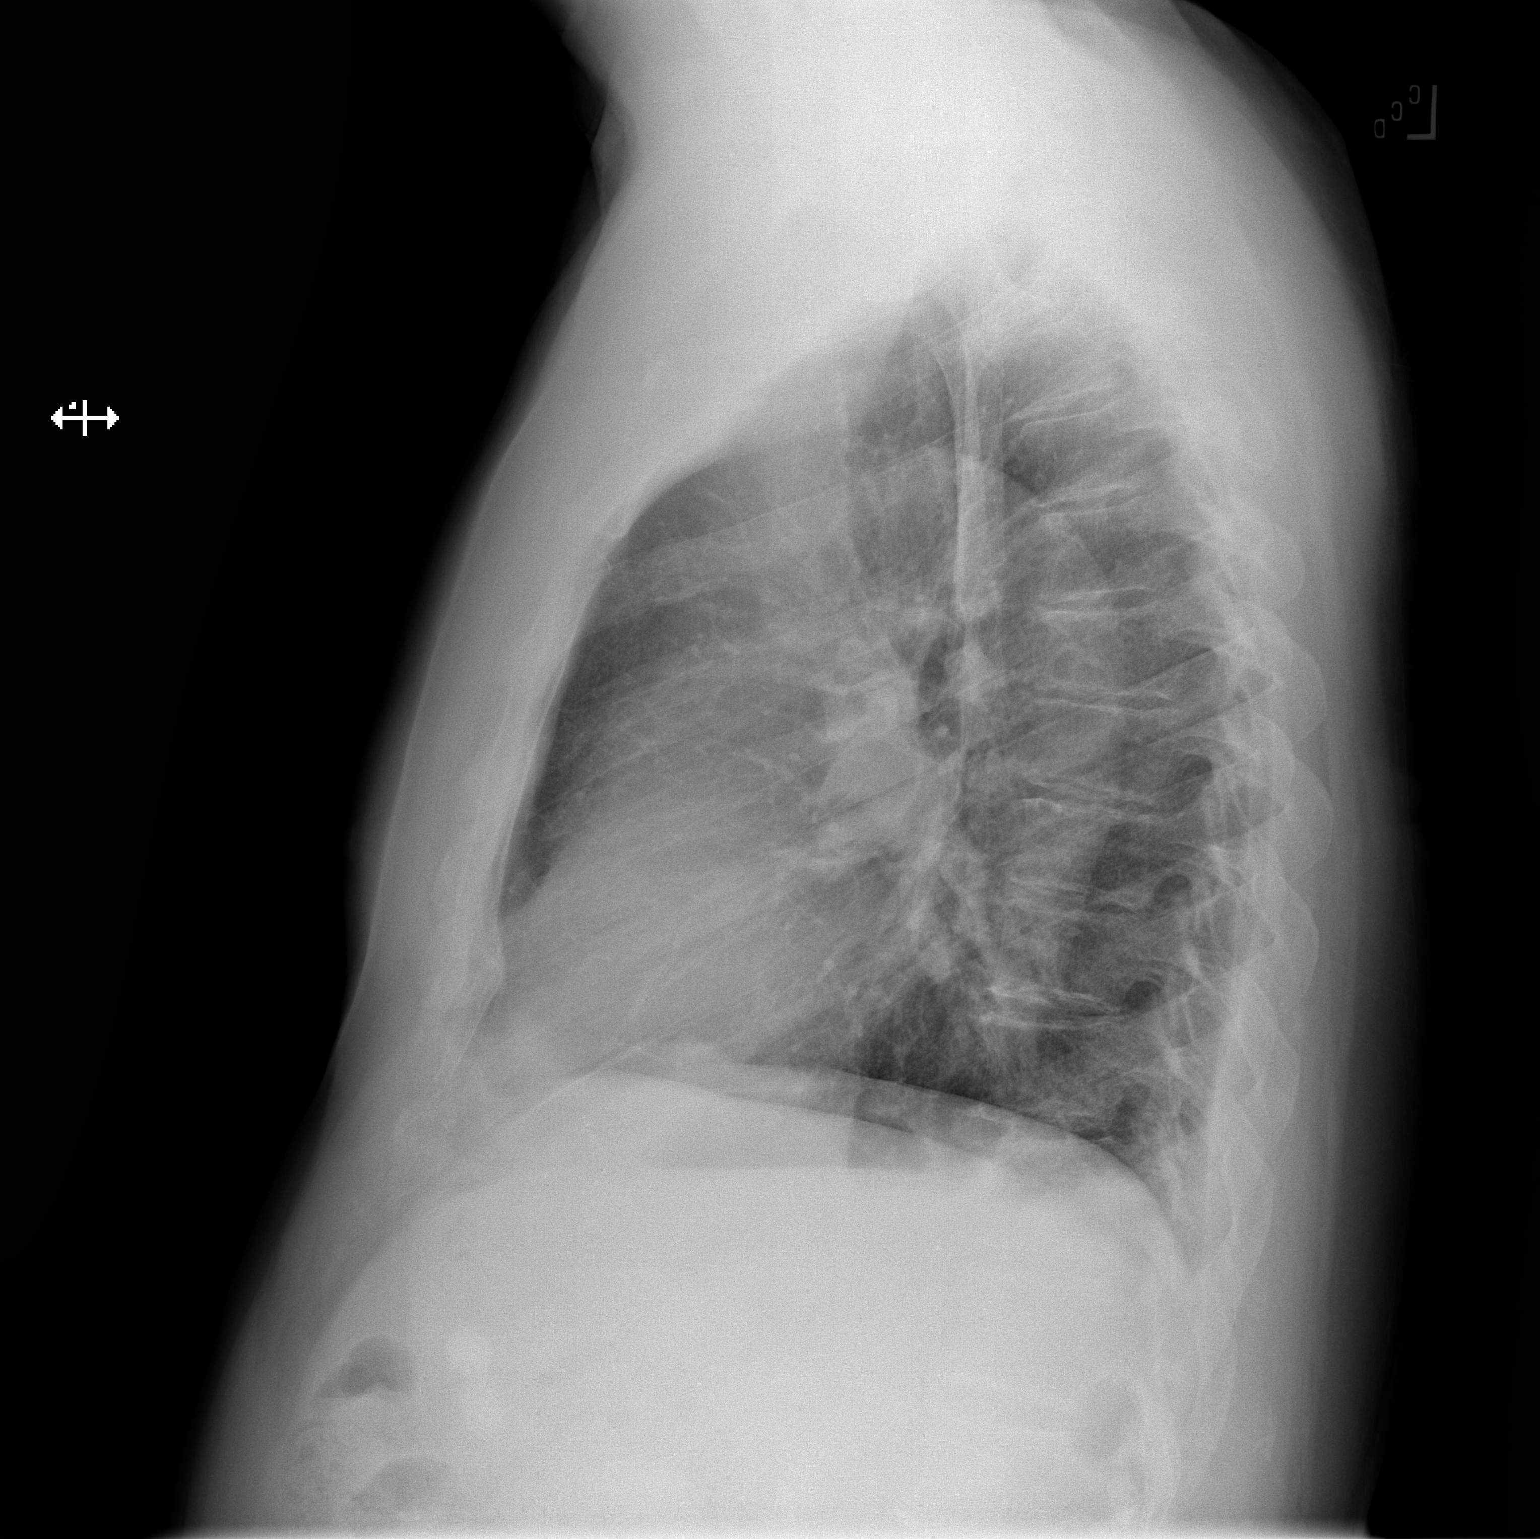

[2 of 2 positions shown; findings below may reference images not displayed]

FINDINGS: The heart size and mediastinal contours are within normal limits. No
pneumothorax is noted. Left lung is clear. Minimal right basilar
subsegmental atelectasis is noted with minimal right pleural
effusion. The visualized skeletal structures are unremarkable.
IMPRESSION: Minimal right basilar subsegmental atelectasis with minimal right
pleural effusion.

## 2021-06-25 MED ORDER — HYDROCODONE-ACETAMINOPHEN 5-325 MG PO TABS
1.0000 | ORAL_TABLET | Freq: Once | ORAL | Status: AC
  Administered 2021-06-25: 1 via ORAL
  Filled 2021-06-25: qty 1

## 2021-06-25 MED ORDER — HYDROCODONE-ACETAMINOPHEN 5-325 MG PO TABS: 1.0000 | ORAL_TABLET | Freq: Four times a day (QID) | ORAL | 0 refills | Status: DC | PRN

## 2021-06-25 NOTE — ED Provider Notes (Signed)
Emergency Medicine Provider Triage Evaluation Note  Shane [name] , a 70 y.o. male  was evaluated in triage.  Pt complains of intermittent right flank pain severe in nature. Feels like a "jolt" that is intermittent. Discharged from the hospital yesterday. While in the hospital was receiving pain meds.   Review of Systems  Positive: Right flank pain Negative: Chest pain, shortness of breath, cough   Physical Exam  BP (!) 167/111 (BP Location: Right Arm)   Pulse 68   Temp 98.5 F (36.9 C) (Oral)   Resp 18   SpO2 95%  Gen:   Awake, no distress   Resp:  Normal effort  MSK:   Moves extremities without difficulty  Other:    Medical Decision Making  Medically screening exam initiated at 11:14 AM.  Appropriate orders placed.  Shane [name] was informed that the remainder of the evaluation will be completed by another provider, this initial triage assessment does not replace that evaluation, and the importance of remaining in the ED until their evaluation is complete.     Shane Fitting, PA-C 06/25/21 1115    Shane Fuse, MD 06/28/21 [phone]

## 2021-06-25 NOTE — Discharge Instructions (Addendum)
Return for any problem.  ?

## 2021-06-25 NOTE — ED Provider Notes (Signed)
[facility] DEPT Provider Note   CSN: [id number] Arrival date & time: 06/25/21  1043     History Chief Complaint  Patient presents with   Flank Pain    Shane [name] is a 70 y.o. male.  70 year old male with prior medical history as detailed below presents for evaluation.  Patient reports recent diagnosis admission for right-sided pulmonary embolus.  Patient was started on Eliquis for same.  Patient reports that prior to his diagnosis of PE he was having intermittent right-sided pleuritic chest pain.  After discharge from [facility] he complains of continued right-sided pleuritic chest discomfort.  Symptoms are worse at night.  He denies any new symptoms including fever, shortness of breath, nausea, vomiting, rash, or other specific complaint.  He has taken some Tylenol at home with minimal improvement in his symptoms.  He requests something stronger than Tylenol for treatment of his right-sided foot and chest pain.  The history is provided by the patient and medical records.  Flank Pain This is a recurrent problem. The current episode started more than 1 week ago. The problem occurs daily. The problem has been resolved. Associated symptoms include chest pain. Pertinent negatives include no shortness of breath. Nothing aggravates the symptoms. Nothing relieves the symptoms.      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 ([place]) 06/28/2019   Cystoscopy [name]) - 2 bladder tumors, 1 on anterior prostate planned TURBT/TURP 06/2019 Biopsy - Gleason 5+4=9 prostate cancer [facility] started 07/2019 planned metastatic survey   History of chicken pox    Lipoma of neck    Seasonal allergies    Syncopal episodes 2007   x 2 s/p unrevealing hospitalization without recurrence    Patient Active Problem List   Diagnosis Date Noted   Acute pulmonary embolism ([place]) 06/24/2021   Atherosclerosis of aorta ([place]) 05/06/2021    CAD (coronary artery disease) 05/06/2021   History of DVT of lower extremity 02/25/2020   Hydronephrosis 02/13/2020   Loss of taste 01/31/2020   Bone metastases ([place]) 12/31/2019   Recurrent UTI 12/31/2019   Malignant neoplasm of prostate ([place]) 08/17/2019   Cancer involving bladder by direct extension from prostate ([place]) 06/28/2019   HLD (hyperlipidemia) 08/02/2018   Health maintenance examination 08/05/2017   Anosmia 08/05/2017   Family history of hemochromatosis 08/03/2016   Medicare annual wellness visit, subsequent 07/29/2015   Advanced care planning/counseling discussion 07/29/2015   Bradycardia 07/29/2015   History of cardiac monitoring 07/29/2015   Syncopal episodes     Past Surgical History:  Procedure Laterality Date   CYSTOSCOPY W/ RETROGRADES Bilateral 07/06/2019   Procedure: [place];  Surgeon: [name], MD;  Location: [facility];  Service: Urology;  Laterality: Bilateral;   EP IMPLANTABLE DEVICE N/A 02/25/2016   Procedure: Loop Recorder Removal;  Surgeon: Deboraha Sprang, MD;  Location: [facility];  Service: Cardiovascular;  Laterality: N/A;   Internal Cardiac Monitor  2007   due to syncopal episodes - never contacted for f/u   IR NEPHROSTOMY PLACEMENT LEFT  02/14/2020   IR NEPHROSTOMY PLACEMENT RIGHT  02/14/2020   LIPOMA EXCISION N/A 02/09/2016   Procedure: EXCISION NECK LIPOMA;  Surgeon: Autumn Messing III, MD;  Location: [facility];  Service: General;  Laterality: N/A;   TRANSURETHRAL RESECTION OF BLADDER TUMOR N/A 07/06/2019   Procedure: TRANSURETHRAL RESECTION OF BLADDER TUMOR (TURBT);  Surgeon: [name], MD;  Location: [facility];  Service: Urology;  Laterality: N/A;       Family History  Problem Relation Age of Onset   Hyperlipidemia Mother    Cancer Mother        peritoneal cancer   Hemochromatosis Father        s/p liver transplant - pt screened negative 07/2015    Hemochromatosis Paternal Grandmother    Hemochromatosis Paternal Uncle    Stroke Neg Hx    Diabetes Neg Hx    CAD Neg Hx     Social History   Tobacco Use   Smoking status: Never   Smokeless tobacco: Never  Vaping Use   Vaping Use: Never used  Substance Use Topics   Alcohol use: Yes    Alcohol/week: 0.0 standard drinks    Comment: 1 beer most evenings   Drug use: No    Home Medications Prior to Admission medications   Medication Sig Start Date End Date Taking? Authorizing Provider  HYDROcodone-acetaminophen (NORCO/VICODIN) 5-325 MG tablet Take 1 tablet by mouth every 6 (six) hours as needed. 06/25/21  Yes Valarie Merino, MD  abiraterone acetate (ZYTIGA) 250 MG tablet Take 4 tablets (1,000 mg total) by mouth daily. Take on an empty stomach 1 hour before or 2 hours after a meal 08/05/20   Shadad, Mathis Dad, MD  APIXABAN [place]) VTE STARTER PACK (10MG  AND 5MG ) Take as directed on package: start with two-5mg  tablets twice daily for 7 days. On day 8, switch to one-5mg  tablet twice daily. 06/24/21   Enzo Bi, MD  Calcium-Magnesium-Vitamin D (CITRACAL SLOW RELEASE PO) Take 2 tablets by mouth daily.    [provider]  Leuprolide Acetate, 4 Month, (ELIGARD) 30 MG injection Inject 30 mg into the skin every 4 (four) months.    [provider]  Mirabegron (MYRBETRIQ PO) Take 1 tablet by mouth daily.    [provider]  predniSONE (DELTASONE) 5 MG tablet TAKE ONE TABLET BY MOUTH DAILY WITH BREAKFAST 05/05/21   [name], MD  venlafaxine (EFFEXOR) 25 MG tablet Take 1 tablet (25 mg total) by mouth 2 (two) times daily. 05/06/21   [name], MD    Allergies    Bactrim [sulfamethoxazole-trimethoprim], Hydrocodone, Percocet [oxycodone-acetaminophen], Vancomycin, Penicillins, and Zithromax [azithromycin]  Review of Systems   Review of Systems  Respiratory:  Negative for shortness of breath.   Cardiovascular:  Positive for chest pain.  Genitourinary:   Positive for flank pain.  All other systems reviewed and are negative.  Physical Exam Updated Vital Signs BP (!) 146/86 (BP Location: Left Arm)   Pulse 67   Temp 98.5 F (36.9 C) (Oral)   Resp 20   SpO2 99%   Physical Exam Vitals and nursing note reviewed.  Constitutional:      General: He is not in acute distress.    Appearance: Normal appearance. He is well-developed.  HENT:     Head: Normocephalic and atraumatic.  Eyes:     Conjunctiva/sclera: Conjunctivae normal.     Pupils: Pupils are equal, round, and reactive to light.  Cardiovascular:     Rate and Rhythm: Normal rate and regular rhythm.     Heart sounds: Normal heart sounds.  Pulmonary:     Effort: Pulmonary effort is normal. No respiratory distress.     Breath sounds: Normal breath sounds.  Abdominal:     General: There is no distension.     Palpations: Abdomen is soft.     Tenderness: There is no abdominal tenderness.  Musculoskeletal:        General: No deformity. Normal range of motion.     Cervical back: Normal range of motion and neck supple.  Skin:    General: Skin is warm and dry.  Neurological:     General: No focal deficit present.     Mental Status: He is alert and oriented to person, place, and time.    ED Results / Procedures / Treatments   Labs (all labs ordered are listed, but only abnormal results are displayed) Labs Reviewed  COMPREHENSIVE METABOLIC PANEL - Abnormal; Notable for the following components:      Result Value   Glucose, Bld 107 (*)    Albumin 3.4 (*)    All other components within normal limits  CBC WITH DIFFERENTIAL/PLATELET - Abnormal; Notable for the following components:   RBC 3.75 (*)    Hemoglobin 12.0 (*)    HCT 36.0 (*)    Abs Immature Granulocytes 0.16 (*)    All other components within normal limits  CBC WITH DIFFERENTIAL/PLATELET  PROTIME-INR  TROPONIN I (HIGH SENSITIVITY)    EKG EKG Interpretation  Date/Time:  Thursday June 25 2021 12:01:21  EDT Ventricular Rate:  64 PR Interval:  189 QRS Duration: 105 QT Interval:  416 QTC Calculation: 430 R Axis:   31 Text Interpretation: Sinus rhythm RSR' in V1 or V2, right VCD or RVH Confirmed by Dene Gentry [phone]) on 06/25/2021 12:16:08 PM  Radiology DG Chest 2 View  Result Date: 06/25/2021 CLINICAL DATA:  Chest pain. EXAM: CHEST - 2 VIEW COMPARISON:  June 23, 2021. FINDINGS: The heart size and mediastinal contours are within normal limits. No pneumothorax is noted. Left lung is clear. Minimal right basilar subsegmental atelectasis is noted with minimal right pleural effusion. The visualized skeletal structures are unremarkable. IMPRESSION: Minimal right basilar subsegmental atelectasis with minimal right pleural effusion. Electronically Signed   By: Marijo Conception M.D.   On: 06/25/2021 11:29   CT Angio Chest PE W and/or Wo Contrast  Result Date: 06/23/2021 CLINICAL DATA:  Upper abdominal/right rib pain, right pleural effusion on CT, elevated D-dimer EXAM: CT ANGIOGRAPHY CHEST WITH CONTRAST TECHNIQUE: Multidetector CT imaging of the chest was performed using the standard protocol during bolus administration of intravenous contrast. Multiplanar CT image reconstructions and MIPs were obtained to evaluate the vascular anatomy. CONTRAST:  35mL OMNIPAQUE IOHEXOL 350 MG/ML SOLN COMPARISON:  Partial comparison to CT abdomen/pelvis dated 06/23/2021. CT chest dated 01/15/2021. FINDINGS: Cardiovascular: Satisfactory opacification of the bilateral pulmonary arteries to the segmental level. Lobar/segmental pulmonary embolism in the posterior right lower lobe (series 5/images 208, 221, 236, and 242). Overall clot burden is small. No evidence of right heart strain. Although not tailored for evaluation of the thoracic aorta, there is no evidence thoracic aortic aneurysm or dissection. Very mild atherosclerotic calcifications of the aortic arch. The heart is normal in size. Mild coronary atherosclerosis of the  LAD. Mediastinum/Nodes: No suspicious mediastinal lymphadenopathy. Visualized thyroid is unremarkable. Lungs/Pleura: 6 mm right lower lobe pulmonary nodule (series 6/image 63), unchanged. Platelike scarring/atelectasis inferiorly in the right middle lobe and right lower lobe. Superimposed ground-glass opacity in the posterior right lower lobe may reflect early/developing pulmonary infarct (series 6/image 74), equivocal. Trace right pleural effusion. No pneumothorax. Upper Abdomen: Visualized upper abdomen is better evaluated on recent CT. Musculoskeletal: Mild degenerative changes of the mid thoracic spine. Healed left anterior 3rd rib fracture (series 4/image 44). Review of the MIP images confirms the above findings. IMPRESSION: Lobar/segmental pulmonary embolism  in the posterior right lower lobe. Overall clot burden is small. No evidence of right heart strain. Possible early/developing pulmonary infarct in the posterior right lower lobe, equivocal. Trace right pleural effusion. Aortic Atherosclerosis (ICD10-I70.0). Critical Value/emergent results were called by telephone at the time of interpretation on 06/23/2021 at 11:06 pm to provider Conni Slipper , who verbally acknowledged these results. Electronically Signed   By: Julian Hy M.D.   On: 06/23/2021 23:07    Procedures Procedures   Medications Ordered in ED Medications  HYDROcodone-acetaminophen (NORCO/VICODIN) 5-325 MG per tablet 1 tablet (1 tablet Oral Given 06/25/21 1206)    ED Course  I have reviewed the triage vital signs and the nursing notes.  Pertinent labs & imaging results that were available during my care of the patient were reviewed by me and considered in my medical decision making (see chart for details).    MDM Rules/Calculators/A&P                           MDM  MSE complete  Shane [name] was evaluated in Emergency Department on 06/25/2021 for the symptoms described in the history of present illness. He was  evaluated in the context of the global COVID-19 pandemic, which necessitated consideration that the patient might be at risk for infection with the SARS-CoV-2 virus that causes COVID-19. Institutional protocols and algorithms that pertain to the evaluation of patients at risk for COVID-19 are in a state of rapid change based on information released by regulatory bodies including the CDC and federal and state organizations. These policies and algorithms were followed during the patient's care in the ED.   Patient presenting with pain that is pleuritic in nature.  Patient's describe symptoms are consistent with previously reported pain preceding his diagnosis of pulmonary embolus.  Patient is compliant with Eliquis.  Patient without evidence of worsening pulmonary emboli.  Patient is comfortable at time of discharge.  He will be prescribed a small number of Norco for pain management.  Importance of close follow-up was stressed.  Strict return precautions given and understood.   Final Clinical Impression(s) / ED Diagnoses Final diagnoses:  Pleuritic chest pain    Rx / DC Orders ED Discharge Orders          Ordered    HYDROcodone-acetaminophen (NORCO/VICODIN) 5-325 MG tablet  Every 6 hours PRN        06/25/21 1328             Valarie Merino, MD 06/25/21 1334

## 2021-06-25 NOTE — Telephone Encounter (Signed)
Noted. Started on eliquis for recent dx PE (RLL lobar PE with possible RLL pulm infarct).  Pain could be coming from noted developing pulm infarct.  Agree with re evaluation if worsening.

## 2021-06-25 NOTE — Telephone Encounter (Signed)
I spoke with pts wife; pt came home 06/25/21; pt is still having pain that seems to be worsening during the night in the same area; not sleeping well due to pain. Now pt said sharpe pains on and off and not the same pain as before. A sharpe pain hit him this morning that pt had to hold to door frame to keep from going to floor; pain was so bad. Pt wanted to know if pt had to go back to ED could pt go to W/L instead. I advised if pain has changed from dull to sharpe and the pain is worsening to the point if pt had not grabbed door frame this morning he would have went to floor due to pain I advised yes pt can go to W/L ED now for reeval. Pts wife declined 911 and will take pt to W/[facility] ED now. Sending note to Dr Darnell Level.

## 2021-06-25 NOTE — Telephone Encounter (Signed)
Vm from pt's wife, [name] (on dpr), stating pt was seen on 06/23/21 at [facility] ED and treated for PE.  Says pt came home yesterday and started having intermittent pain and through the night in the same area, progressively worsening.  She is asking if this is normal or life threatening?  Does pt need OV or should he return to ER.  Plz advise at [phone].  Fwd to Rena to triage pt.

## 2021-06-25 NOTE — ED Triage Notes (Signed)
Pt complains of intermittent right flank pain. He was diagnosed with PE yesterday at [facility], was started on eliquis yesterday.

## 2021-06-30 ENCOUNTER — Telehealth: Payer: Self-pay

## 2021-06-30 ENCOUNTER — Encounter: Payer: Self-pay | Admitting: Family Medicine

## 2021-06-30 ENCOUNTER — Ambulatory Visit (INDEPENDENT_AMBULATORY_CARE_PROVIDER_SITE_OTHER): Payer: PPO | Admitting: Family Medicine

## 2021-06-30 ENCOUNTER — Other Ambulatory Visit: Payer: Self-pay

## 2021-06-30 VITALS — BP 140/84 | HR 60 | Temp 97.4°F | Ht 72.0 in | Wt 195.1 lb

## 2021-06-30 DIAGNOSIS — I700 Atherosclerosis of aorta: Secondary | ICD-10-CM | POA: Diagnosis not present

## 2021-06-30 DIAGNOSIS — H53453 Other localized visual field defect, bilateral: Secondary | ICD-10-CM

## 2021-06-30 DIAGNOSIS — I2699 Other pulmonary embolism without acute cor pulmonale: Secondary | ICD-10-CM | POA: Diagnosis not present

## 2021-06-30 DIAGNOSIS — C7911 Secondary malignant neoplasm of bladder: Secondary | ICD-10-CM

## 2021-06-30 DIAGNOSIS — C61 Malignant neoplasm of prostate: Secondary | ICD-10-CM | POA: Diagnosis not present

## 2021-06-30 DIAGNOSIS — H5461 Unqualified visual loss, right eye, normal vision left eye: Secondary | ICD-10-CM | POA: Diagnosis not present

## 2021-06-30 DIAGNOSIS — E7800 Pure hypercholesterolemia, unspecified: Secondary | ICD-10-CM | POA: Diagnosis not present

## 2021-06-30 MED ORDER — ATORVASTATIN CALCIUM 20 MG PO TABS: 20.0000 mg | ORAL_TABLET | Freq: Every day | ORAL | 3 refills | Status: DC

## 2021-06-30 MED ORDER — APIXABAN 5 MG PO TABS: 5.0000 mg | ORAL_TABLET | Freq: Two times a day (BID) | ORAL | 1 refills | Status: DC

## 2021-06-30 NOTE — Telephone Encounter (Signed)
Spoke with pt, per Dr. Darnell Level, asking for name of eye doc to place referral.  Pt states he sees Dr. Lorie Apley on [address] [place].

## 2021-06-30 NOTE — Progress Notes (Signed)
Patient ID: Shane [name], male    DOB: 10/24/1951, 70 y.o.   MRN: [id number]  This visit was conducted in person.  BP 140/84   Pulse 60   Temp (!) 97.4 F (36.3 C) (Temporal)   Ht 6' (1.829 m)   Wt 195 lb 1 oz (88.5 kg)   SpO2 98%   BMI 26.46 kg/m    CC: hosp f/u visit  Subjective:   HPI: Shane [name] is a 70 y.o. male presenting on 06/30/2021 for Hospitalization Follow-up (Seen on 06/23/21 at [facility], dx R flank pain.  Then seen, also on 06/23/21, at [facility] ED, dx other acute PE without cor pulmonale.  Also seen on 06/25/21 at [facility] ED, dx pleuritic chest pain.  Pt accompanied by wife, Shane [name]- temp 97.9.)   Known metastatic prostate cancer to bladder LN and bone diagnosed 0000000 complicated by PE and resistant pseudomonas UTI at that time treated with xarelto and IV cefepime via PICC and nephrostomy tubes, respectively. Followed by Shane [name]), onc Shane [name]) and ID Shane [name]). Continues zytiga and leuprolide Q37mo planned androgen deprivation indefinitely.   [facility] R flank and back pain with pleuritic chest pain. Subsequent eval with elevated D dimer - sent to ER where was found to have pulm embolism. Started on IV heparin then eliquis currently on '10mg'$  bid x 7 days then switch to '5mg'$  bid course. Admitted overnight.   Since home, feeling better since blood thinners started. No more R sided pain. Sunday morning coughed up blood tinged phlegm x1.  Chronic mild productive cough.   Recent COVID tested positive 06/02/2021. PE happened after COVID. Notes ongoing headaches since COVID.   Notes worsening short term memory over last 3 months.   By the way - episode of R eye peripheral vision loss that lasted 12 hours. This was day prior to testing positive for COVID. Last saw eye doctor 1 yr ago. No palpitations or tachycardia noted. No dizziness. No unilateral numbness, weakness, slurred speech, confusion, or headache.   CT angio chest with and without contrast  IMPRESSION:  Lobar/segmental pulmonary embolism in the posterior right lower lobe. Overall clot burden is small. No evidence of right heart strain. Possible early/developing pulmonary infarct in the posterior right lower lobe, equivocal. Trace right pleural effusion. Aortic Atherosclerosis (ICD10-I70.0).   Admit date: 06/23/2021 Discharge date: 06/24/2021 TCM hosp f/u phone call not completed   Admitted From: home Disposition:  home CODE STATUS: Full code  Discharge Diagnoses:  Principal Problem:   Acute pulmonary embolism ([facility] Active Problems:   Cancer involving bladder by direct extension from prostate ([facility]   Malignant neoplasm of prostate ([facility]   History of DVT of lower extremity     Relevant past medical, surgical, family and social history reviewed and updated as indicated. Interim medical history since our last visit reviewed. Allergies and medications reviewed and updated. Outpatient Medications Prior to Visit  Medication Sig Dispense Refill   abiraterone acetate (ZYTIGA) 250 MG tablet Take 4 tablets (1,000 mg total) by mouth daily. Take on an empty stomach 1 hour before or 2 hours after a meal 120 tablet 10   APIXABAN (ELIQUIS) VTE STARTER PACK ('10MG'$  AND '5MG'$ ) Take as directed on package: start with two-'5mg'$  tablets twice daily for 7 days. On day 8, switch to one-'5mg'$  tablet twice daily. 1 each 0   Calcium-Magnesium-Vitamin D (CITRACAL SLOW RELEASE PO) Take 2 tablets by mouth daily.     HYDROcodone-acetaminophen (NORCO/VICODIN) 5-325 MG tablet Take 1  tablet by mouth every 6 (six) hours as needed. 10 tablet 0   Leuprolide Acetate, 4 Month, (ELIGARD) 30 MG injection Inject 30 mg into the skin every 4 (four) months.     Mirabegron (MYRBETRIQ PO) Take 1 tablet by mouth daily.     predniSONE (DELTASONE) 5 MG tablet TAKE ONE TABLET BY MOUTH DAILY WITH BREAKFAST 90 tablet 0   venlafaxine (EFFEXOR) 25 MG tablet Take 2 tablets (50 mg total) by mouth 2 (two) times daily.     venlafaxine  (EFFEXOR) 25 MG tablet Take 25 mg by mouth 2 (two) times daily. Takes 2 tablets twice a day     No facility-administered medications prior to visit.     Per HPI unless specifically indicated in ROS section below Review of Systems  Objective:  BP 140/84   Pulse 60   Temp (!) 97.4 F (36.3 C) (Temporal)   Ht 6' (1.829 m)   Wt 195 lb 1 oz (88.5 kg)   SpO2 98%   BMI 26.46 kg/m   Wt Readings from Last 3 Encounters:  06/30/21 195 lb 1 oz (88.5 kg)  06/23/21 191 lb (86.6 kg)  05/06/21 195 lb (88.5 kg)      Physical Exam Vitals and nursing note reviewed.  Constitutional:      Appearance: Normal appearance. He is not ill-appearing.  HENT:     Head: Normocephalic and atraumatic.  Eyes:     Extraocular Movements: Extraocular movements intact.     Conjunctiva/sclera: Conjunctivae normal.     Pupils: Pupils are equal, round, and reactive to light.  Neck:     Vascular: No carotid bruit.  Cardiovascular:     Rate and Rhythm: Normal rate and regular rhythm.     Pulses: Normal pulses.     Heart sounds: Normal heart sounds. No murmur heard. Pulmonary:     Effort: Pulmonary effort is normal. No respiratory distress.     Breath sounds: Normal breath sounds. No wheezing, rhonchi or rales.  Musculoskeletal:     Cervical back: Normal range of motion and neck supple.     Right lower leg: No edema.     Left lower leg: No edema.  Lymphadenopathy:     Cervical: No cervical adenopathy.  Skin:    General: Skin is warm and dry.     Findings: No rash.  Neurological:     General: No focal deficit present.     Mental Status: He is alert.     Cranial Nerves: Cranial nerves are intact.     Sensory: Sensation is intact.     Motor: Motor function is intact.     Coordination: Coordination is intact. Romberg sign negative. Coordination normal.     Comments:  CN 2-12 intact FTN intact EOMI  Psychiatric:        Mood and Affect: Mood normal.        Behavior: Behavior normal.      Results  for orders placed or performed during the hospital encounter of 06/25/21  Comprehensive metabolic panel  Result Value Ref Range   Sodium 138 135 - 145 mmol/L   Potassium 3.7 3.5 - 5.1 mmol/L   Chloride 105 98 - 111 mmol/L   CO2 27 22 - 32 mmol/L   Glucose, Bld 107 (H) 70 - 99 mg/dL   BUN 20 8 - 23 mg/dL   Creatinine, Ser 1.10 0.61 - 1.24 mg/dL   Calcium 9.5 8.9 - 10.3 mg/dL   Total Protein 6.7 6.5 -  8.1 g/dL   Albumin 3.4 (L) 3.5 - 5.0 g/dL   AST 22 15 - 41 U/L   ALT 22 0 - 44 U/L   Alkaline Phosphatase 48 38 - 126 U/L   Total Bilirubin 0.7 0.3 - 1.2 mg/dL   GFR, Estimated >60 >60 mL/min   Anion gap 6 5 - 15  CBC with Differential/Platelet  Result Value Ref Range   WBC 8.0 4.0 - 10.5 K/uL   RBC 3.75 (L) 4.22 - 5.81 MIL/uL   Hemoglobin 12.0 (L) 13.0 - 17.0 g/dL   HCT 36.0 (L) 39.0 - 52.0 %   MCV 96.0 80.0 - 100.0 fL   MCH 32.0 26.0 - 34.0 pg   MCHC 33.3 30.0 - 36.0 g/dL   RDW 11.8 11.5 - 15.5 %   Platelets 220 150 - 400 K/uL   nRBC 0.0 0.0 - 0.2 %   Neutrophils Relative % 70 %   Neutro Abs 5.7 1.7 - 7.7 K/uL   Lymphocytes Relative 11 %   Lymphs Abs 0.9 0.7 - 4.0 K/uL   Monocytes Relative 11 %   Monocytes Absolute 0.9 0.1 - 1.0 K/uL   Eosinophils Relative 6 %   Eosinophils Absolute 0.4 0.0 - 0.5 K/uL   Basophils Relative 0 %   Basophils Absolute 0.0 0.0 - 0.1 K/uL   Immature Granulocytes 2 %   Abs Immature Granulocytes 0.16 (H) 0.00 - 0.07 K/uL  Troponin I (High Sensitivity)  Result Value Ref Range   Troponin I (High Sensitivity) 6 <18 ng/L   Lab Results  Component Value Date   CHOL 196 04/27/2021   HDL 47.00 04/27/2021   LDLCALC 117 (H) 04/27/2021   TRIG 163.0 (H) 04/27/2021   CHOLHDL 4 04/27/2021    Assessment & Plan:  This visit occurred during the SARS-CoV-2 public health emergency.  Safety protocols were in place, including screening questions prior to the visit, additional usage of staff PPE, and extensive cleaning of exam room while observing appropriate  contact time as indicated for disinfecting solutions.   Problem List Items Addressed This Visit     HLD (hyperlipidemia)    Chronic, see above, start atorvastatin '20mg'$  daily.  The 10-year ASCVD risk score Mikey Bussing DC Brooke Bonito., et al., 2013) is: 21.2%   Values used to calculate the score:     Age: 33 years     Sex: Male     Is Non-Hispanic African American: No     Diabetic: No     Tobacco smoker: No     Systolic Blood Pressure: [id number] mmHg     Is BP treated: No     HDL Cholesterol: 47 mg/dL     Total Cholesterol: 196 mg/dL        Relevant Medications   apixaban (ELIQUIS) 5 MG TABS tablet   atorvastatin (LIPITOR) 20 MG tablet   Cancer involving bladder by direct extension from prostate [facility])   Malignant neoplasm of prostate ([place])   Atherosclerosis of aorta ([place])    See above - start statin.        Relevant Medications   apixaban (ELIQUIS) 5 MG TABS tablet   atorvastatin (LIPITOR) 20 MG tablet   Acute pulmonary embolism (HCC) - Primary    Recent RLL lobar/segmental PE with developing pulm infarct to RLL. This explains most of his symptoms. Doing well since eliquis started, currently on loading dose x1 wk then '5mg'$  bid, planned at least 3 months of anticoagulation. 2nd provoked PE. Will appreciate heme/onc input regarding  duration. Discussed with patient.        Relevant Medications   apixaban (ELIQUIS) 5 MG TABS tablet   atorvastatin (LIPITOR) 20 MG tablet   Vision loss of right eye    Describes isolated episode of R eye peripheral vision loss that lasted 12 hours late last month. Thinks only right eye was affected but didn't seek evaluation to r/o L eye involvement. Concern for possible stroke ?homonymous hemianopsia - will start statin, refer to ophthalmology, and keep close f/u with oncology. Non-focal neurological exam today.        Relevant Orders   VAS US CAROTID   Ambulatory referral to Ophthalmology     Meds ordered this encounter  Medications   apixaban (ELIQUIS) 5 MG  TABS tablet    Sig: Take 1 tablet (5 mg total) by mouth 2 (two) times daily.    Dispense:  60 tablet    Refill:  1    Fill after 07/24/2021   atorvastatin (LIPITOR) 20 MG tablet    Sig: Take 1 tablet (20 mg total) by mouth daily.    Dispense:  90 tablet    Refill:  3    Orders Placed This Encounter  Procedures   Ambulatory referral to Ophthalmology    Referral Priority:   Routine    Referral Type:   Consultation    Referral Reason:   Specialty Services Required    Requested Specialty:   Ophthalmology    Number of Visits Requested:   1     Patient Instructions  Start atorvastatin '20mg'$  daily  Schedule follow up exam with eye doctor.  We will refer you for carotid ultrasound.  I have filled eliquis for 3 months total. Would like input by Dr Shane [name] for duration of blood thinner.  Good to see you today.   Follow up plan: Return if symptoms worsen or fail to improve.  [name], MD

## 2021-06-30 NOTE — Patient Instructions (Addendum)
Start atorvastatin '20mg'$  daily  Schedule follow up exam with eye doctor.  We will refer you for carotid ultrasound.  I have filled eliquis for 3 months total. Would like input by Dr Alen Blew for duration of blood thinner.  Good to see you today.

## 2021-07-01 NOTE — Assessment & Plan Note (Addendum)
Describes isolated episode of R eye peripheral vision loss that lasted 12 hours late last month. Thinks only right eye was affected but didn't seek evaluation to r/o L eye involvement. Concern for possible stroke ?homonymous hemianopsia - will start statin, refer to ophthalmology, and keep close f/u with oncology. Non-focal neurological exam today.

## 2021-07-01 NOTE — Assessment & Plan Note (Addendum)
Recent RLL lobar/segmental PE with developing pulm infarct to RLL. This explains most of his symptoms. Doing well since eliquis started, currently on loading dose x1 wk then '5mg'$  bid, planned at least 3 months of anticoagulation. 2nd provoked PE. Will appreciate heme/onc input regarding duration. Discussed with patient.

## 2021-07-01 NOTE — Assessment & Plan Note (Signed)
See above - start statin.

## 2021-07-01 NOTE — Assessment & Plan Note (Signed)
Chronic, see above, start atorvastatin '20mg'$  daily.  The 10-year ASCVD risk score Mikey Bussing DC Brooke Bonito., et al., 2013) is: 21.2%   Values used to calculate the score:     Age: 70 years     Sex: Male     Is Non-Hispanic African American: No     Diabetic: No     Tobacco smoker: No     Systolic Blood Pressure: [id number] mmHg     Is BP treated: No     HDL Cholesterol: 47 mg/dL     Total Cholesterol: 196 mg/dL

## 2021-07-03 DIAGNOSIS — H5461 Unqualified visual loss, right eye, normal vision left eye: Secondary | ICD-10-CM | POA: Diagnosis not present

## 2021-07-07 NOTE — Telephone Encounter (Signed)
Spoke with pt relaying Dr. G's message. Pt verbalizes understanding.  

## 2021-07-07 NOTE — Telephone Encounter (Signed)
Spoke with Dr Gloriann Loan regarding recent vision changes.  Periph neurologic changes ?retinitis pigmentosa changes however normal retina. Persistent peripheral visual field loss.  No papilledema  ?bilateral cerebral infarct.  ?med related.   Rec head imaging for further evaluation. Will work on ordering MRI/MRA.

## 2021-07-07 NOTE — Telephone Encounter (Signed)
[name] called in returning Ms. Shane [name] phone call.

## 2021-07-07 NOTE — Telephone Encounter (Signed)
Lvm asking pt to call back.  Need to notify pt Dr. Darnell Level spoke with Dr. Gloriann Loan about the vision changes.  Dr. Darnell Level is recommending head imaging for further evaluation and he is working on the order.  Pt will be contacted to get scheduled.

## 2021-07-08 ENCOUNTER — Encounter: Payer: Self-pay | Admitting: Family Medicine

## 2021-07-08 NOTE — Telephone Encounter (Signed)
Spoke with patient. I will get MRI brain and keep carotid US ordered.

## 2021-07-08 NOTE — Telephone Encounter (Addendum)
Brain MRI ordered to r/o stroke.

## 2021-07-08 NOTE — Addendum Note (Signed)
Addended by: [name] on: 07/08/2021 05:54 PM   Modules accepted: Orders

## 2021-07-09 ENCOUNTER — Telehealth: Payer: Self-pay | Admitting: *Deleted

## 2021-07-09 NOTE — Telephone Encounter (Signed)
Patient called asking for a refill of his Zytiga to be called into [facility] before August 05, 2021 to continue to receive free drug.  See previous phone note of when it was done last on 12/03/2020.  Phone number for Lina Sar confirmed [phone]

## 2021-07-10 ENCOUNTER — Other Ambulatory Visit: Payer: Self-pay | Admitting: Internal Medicine

## 2021-07-15 ENCOUNTER — Ambulatory Visit
Admission: RE | Admit: 2021-07-15 | Discharge: 2021-07-15 | Disposition: A | Discharge status: Home / Self Care | Payer: PPO | Source: Ambulatory Visit | Attending: Family Medicine | Admitting: Family Medicine

## 2021-07-15 ENCOUNTER — Other Ambulatory Visit: Payer: Self-pay

## 2021-07-15 DIAGNOSIS — H5461 Unqualified visual loss, right eye, normal vision left eye: Secondary | ICD-10-CM | POA: Diagnosis not present

## 2021-07-15 DIAGNOSIS — H53453 Other localized visual field defect, bilateral: Secondary | ICD-10-CM

## 2021-07-15 DIAGNOSIS — H547 Unspecified visual loss: Secondary | ICD-10-CM | POA: Diagnosis not present

## 2021-07-15 IMAGING — MR MR HEAD W/O CM
12 series · 48 of 48 positions shown · non-contrast
Comparison: CT head [DATE]

CLINICAL DATA: Monocular vision loss.  Suspect stroke.

EXAM:
MRI HEAD WITHOUT CONTRAST
TECHNIQUE: Multiplanar, multiecho pulse sequences of the brain and surrounding
structures were obtained without intravenous contrast.

[Series 5: ax dwi_tracew · axial · 3.0mm · 0.65mm/px · z∈[-89,+64]mm · 4 of 48 slices shown]
[im 1/48]
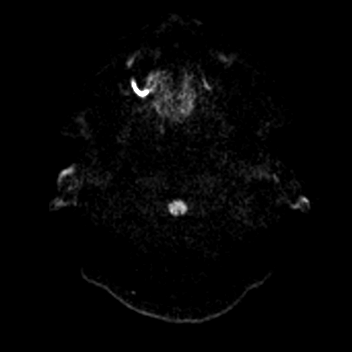
[im 16/48]
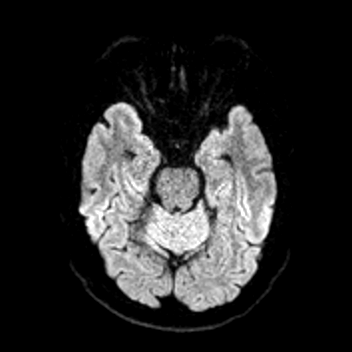
[im 32/48]
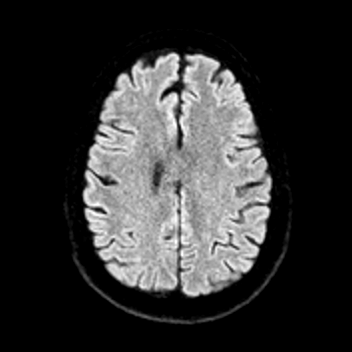
[im 48/48]
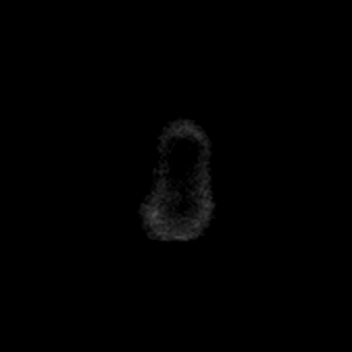

[Series 6: ax dwi_adc · axial · 3.0mm · 0.65mm/px · z∈[-89,+64]mm · 3 of 48 slices shown]
[im 1/48]
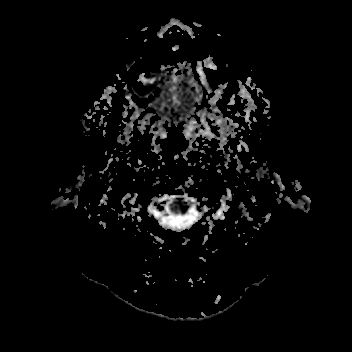
[im 24/48]
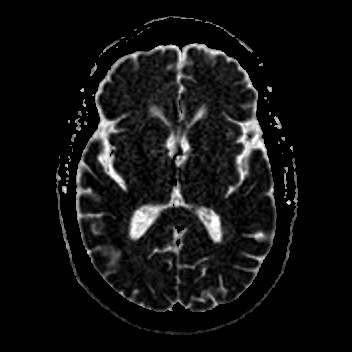
[im 48/48]
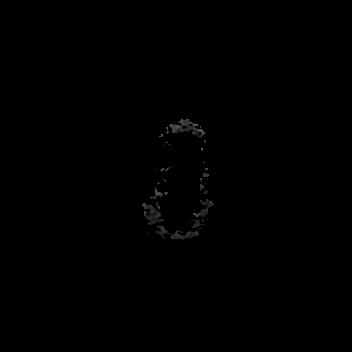

[Series 7: cor dwi_tracew · coronal · 5.0mm · 0.65mm/px · 3 of 40 slices shown]
[im 1/40]
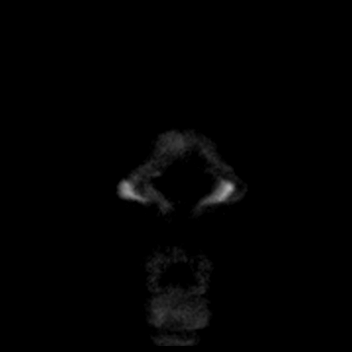
[im 20/40]
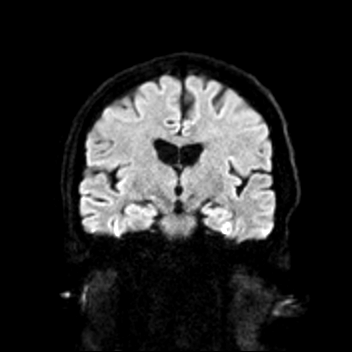
[im 40/40]
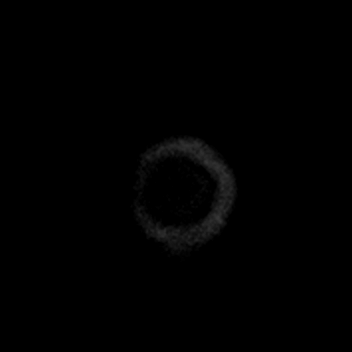

[Series 8: cor dwi_adc · coronal · 5.0mm · 0.65mm/px · 3 of 40 slices shown]
[im 1/40]
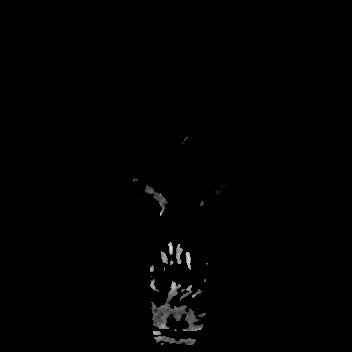
[im 20/40]
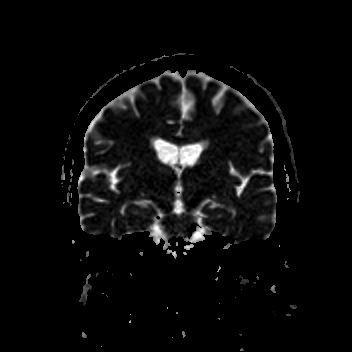
[im 40/40]
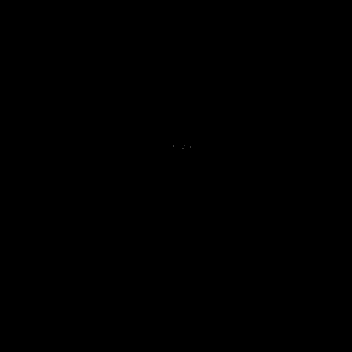

[Series 9: T1 · sagittal · 5.0mm · 0.62mm/px · 2 of 24 slices shown (1 of 2)]
[im 1/24]
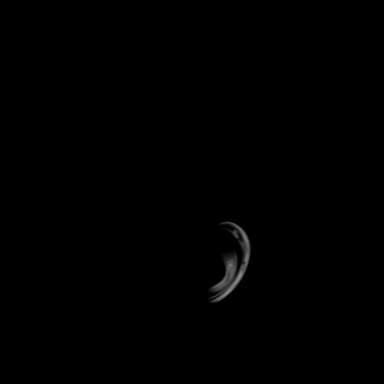
[im 24/24]
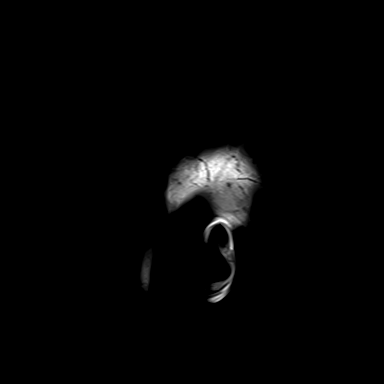

[Series 10: T2 · axial · 5.0mm · 0.53mm/px · z∈[-89,+65]mm · 2 of 27 slices shown (1 of 2)]
[im 1/27]
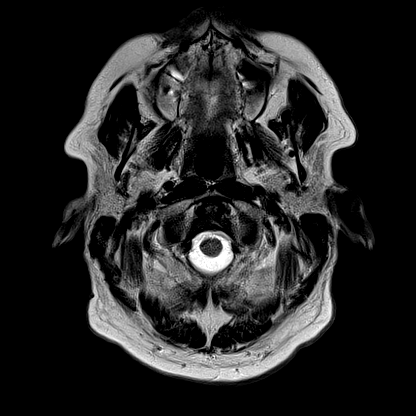
[im 27/27]
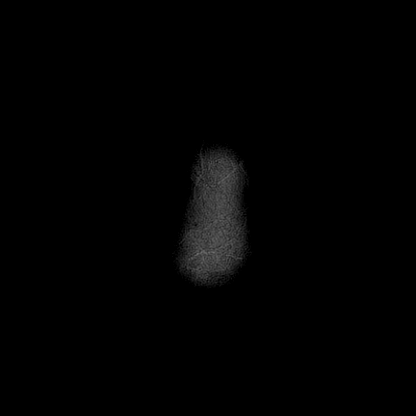

[Series 11: mag_images · axial · 3.0mm · 0.90mm/px · z∈[-100,+75]mm · 4 of 60 slices shown]
[im 1/60]
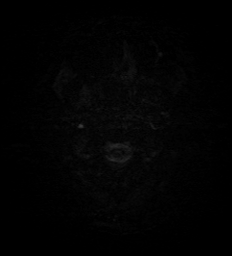
[im 20/60]
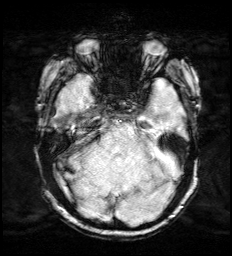
[im 40/60]
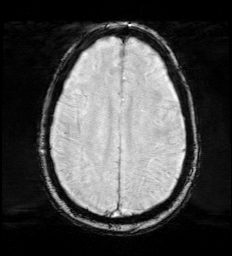
[im 60/60]
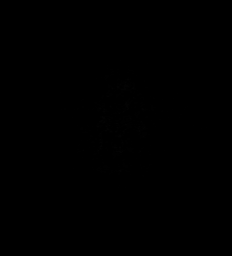

[Series 12: pha_images · axial · 3.0mm · 0.90mm/px · z∈[-100,+75]mm · 4 of 60 slices shown]
[im 1/60]
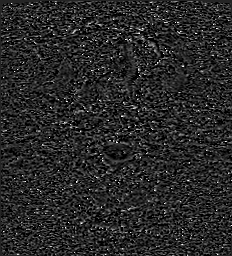
[im 20/60]
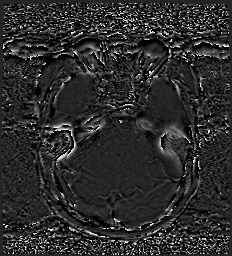
[im 40/60]
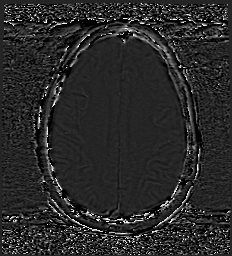
[im 60/60]
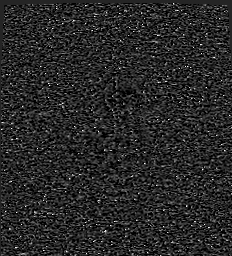

[Series 13: swi_images · axial · 3.0mm · 0.90mm/px · z∈[-100,+75]mm · 4 of 60 slices shown]
[im 1/60]
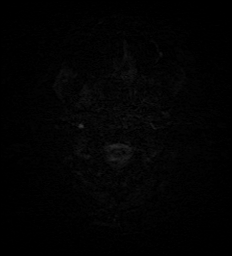
[im 20/60]
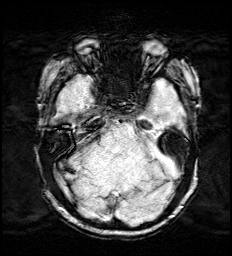
[im 40/60]
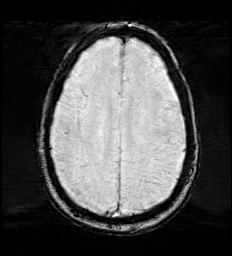
[im 60/60]
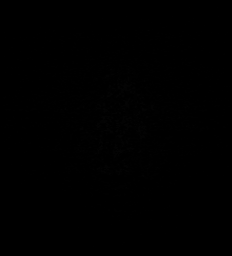

[Series 15: FLAIR · axial · 3.0mm · 0.53mm/px · z∈[-92,+68]mm · 4 of 55 slices shown]
[im 1/55]
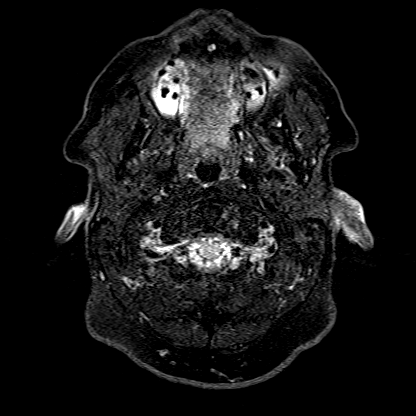
[im 19/55]
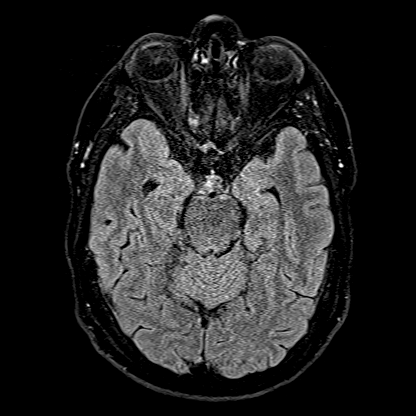
[im 37/55]
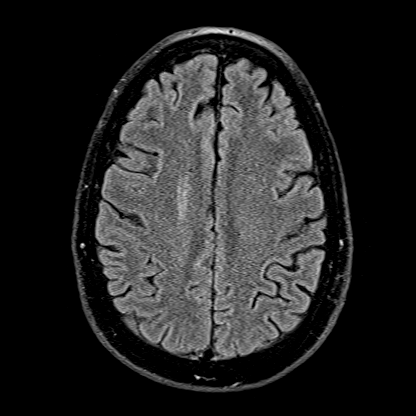
[im 55/55]
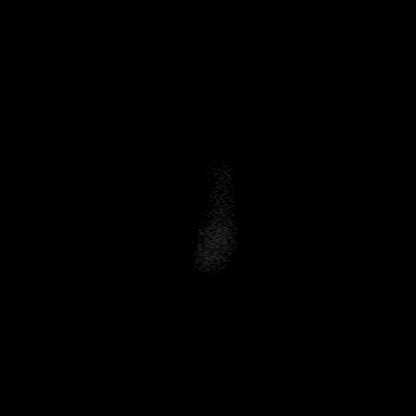

[Series 16: T1 · axial · 1.0mm · 0.98mm/px · z∈[-101,+73]mm · 13 of 176 slices shown (2 of 2)]
[im 1/176]
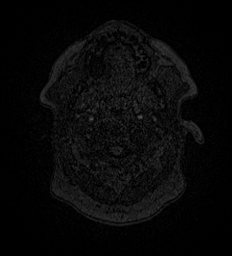
[im 15/176]
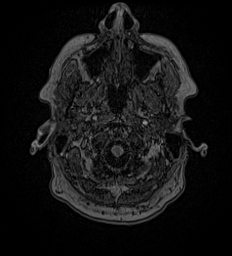
[im 30/176]
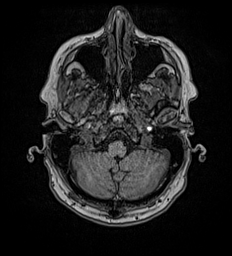
[im 44/176]
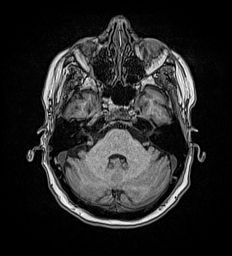
[im 59/176]
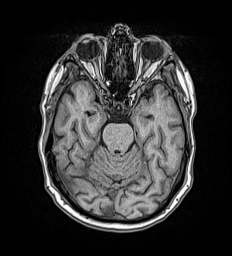
[im 73/176]
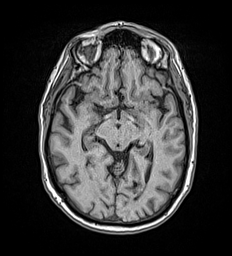
[im 88/176]
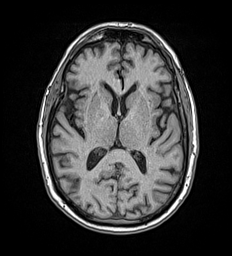
[im 103/176]
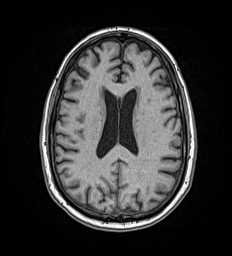
[im 117/176]
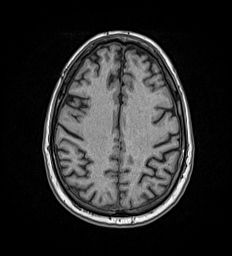
[im 132/176]
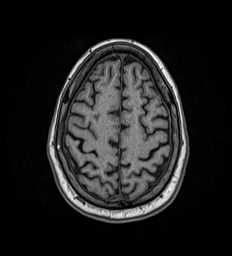
[im 146/176]
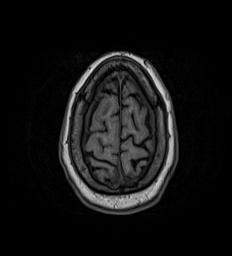
[im 161/176]
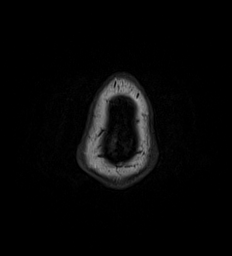
[im 176/176]
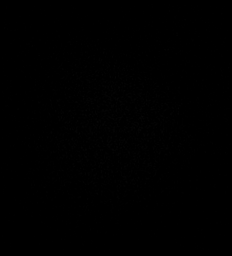

[Series 17: T2 · coronal · 5.0mm · 0.57mm/px · 2 of 33 slices shown (2 of 2)]
[im 1/33]
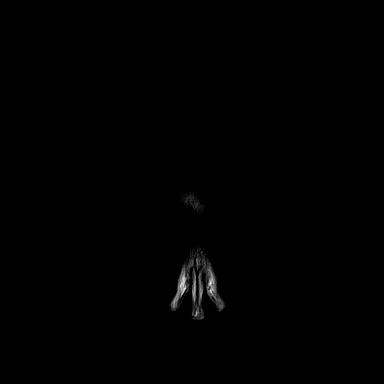
[im 33/33]
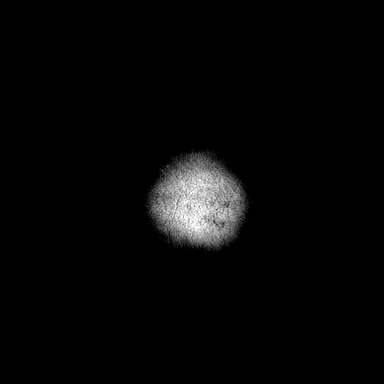

[48 of 48 positions shown; findings below may reference images not displayed]

FINDINGS: Brain: Negative for acute infarct. Mild white matter changes with
periventricular white matter hyperintensity around the lateral
ventricles. Few small deep white matter hyperintensities. Brainstem
and cerebellum normal. Negative for hemorrhage or mass. No
hydrocephalus. Mild amount of T2 shine through in the left occipital
periventricular white matter on DWI.

Vascular: Normal arterial flow voids

Skull and upper cervical spine: No focal skeletal lesion.

Sinuses/Orbits: Minimal mucosal edema paranasal sinuses. No orbital
lesion.

Other: None
IMPRESSION: Negative for acute infarct

Periventricular white matter hyperintensity bilaterally may be due
to chronic microvascular ischemia.

## 2021-07-15 NOTE — Addendum Note (Signed)
Addended by: [name] on: 07/15/2021 05:33 PM   Modules accepted: Orders

## 2021-07-15 NOTE — Addendum Note (Signed)
Addended by: Virl Cagey on: 07/15/2021 05:22 PM   Modules accepted: Orders

## 2021-07-16 ENCOUNTER — Encounter: Payer: Self-pay | Admitting: Family Medicine

## 2021-07-16 DIAGNOSIS — H53453 Other localized visual field defect, bilateral: Secondary | ICD-10-CM | POA: Insufficient documentation

## 2021-07-16 NOTE — Telephone Encounter (Signed)
This has been taken care of  Pt has been scanned.  Nothing further needed.

## 2021-07-20 ENCOUNTER — Encounter: Payer: Self-pay | Admitting: *Deleted

## 2021-07-22 ENCOUNTER — Ambulatory Visit (INDEPENDENT_AMBULATORY_CARE_PROVIDER_SITE_OTHER): Payer: PPO

## 2021-07-22 ENCOUNTER — Other Ambulatory Visit: Payer: Self-pay

## 2021-07-22 DIAGNOSIS — H53121 Transient visual loss, right eye: Secondary | ICD-10-CM | POA: Diagnosis not present

## 2021-07-22 DIAGNOSIS — H5461 Unqualified visual loss, right eye, normal vision left eye: Secondary | ICD-10-CM

## 2021-07-23 ENCOUNTER — Telehealth: Payer: Self-pay

## 2021-07-23 NOTE — Telephone Encounter (Signed)
Received call from wife patient is needing refill of Eliquis. Let her know that refill was sent in for #60 with 1 refill that should be on hold to fill tomorrow. She will call pharmacy and make sure. Will let our office know if any further action needed. No action required at this time this is only for documentation of call.

## 2021-07-30 ENCOUNTER — Telehealth: Payer: Self-pay | Admitting: Family Medicine

## 2021-07-30 NOTE — Telephone Encounter (Signed)
Mr. Frazier called in returning [place] phone call about the test Dr. Darnell Level ordered.

## 2021-07-30 NOTE — Telephone Encounter (Signed)
This patient got confused and the results he was trying to receive were for his wife.

## 2021-07-31 ENCOUNTER — Telehealth: Payer: Self-pay | Admitting: Oncology

## 2021-07-31 NOTE — Telephone Encounter (Signed)
Called patient regarding September appointments, left a voicemail.

## 2021-08-05 ENCOUNTER — Telehealth: Payer: Self-pay | Admitting: Family Medicine

## 2021-08-05 ENCOUNTER — Encounter: Payer: Self-pay | Admitting: Family Medicine

## 2021-08-05 DIAGNOSIS — R319 Hematuria, unspecified: Secondary | ICD-10-CM

## 2021-08-05 NOTE — Telephone Encounter (Signed)
Shane [name] called in wanted to know if he needed to see his oncologist due to the blood in the urine. He does have an appointment for 9/14 @ 12. Offered him someone else due to the time but he stated he didn't want to put it on someone else due to Dr. Darnell Level is familiar with him.

## 2021-08-06 ENCOUNTER — Other Ambulatory Visit: Payer: Self-pay

## 2021-08-06 ENCOUNTER — Other Ambulatory Visit (INDEPENDENT_AMBULATORY_CARE_PROVIDER_SITE_OTHER): Payer: PPO

## 2021-08-06 DIAGNOSIS — R319 Hematuria, unspecified: Secondary | ICD-10-CM

## 2021-08-06 LAB — POC URINALSYSI DIPSTICK (AUTOMATED)
Bilirubin, UA: NEGATIVE
Glucose, UA: NEGATIVE
Ketones, UA: NEGATIVE
Leukocytes, UA: NEGATIVE
Nitrite, UA: NEGATIVE
Protein, UA: NEGATIVE
Spec Grav, UA: 1.015 (ref 1.010–1.025)
Urobilinogen, UA: 0.2 E.U./dL
pH, UA: 7.5 (ref 5.0–8.0)

## 2021-08-06 NOTE — Addendum Note (Signed)
Addended by: Cloyd Stagers on: 08/06/2021 04:32 PM   Modules accepted: Orders

## 2021-08-06 NOTE — Addendum Note (Signed)
Addended by: Cloyd Stagers on: 08/06/2021 04:30 PM   Modules accepted: Orders

## 2021-08-06 NOTE — Telephone Encounter (Signed)
Pt provided urine sample.  Ran UA and documented results.  Ordered UCX.

## 2021-08-06 NOTE — Telephone Encounter (Signed)
I actually ordered urinalysis with reflex to microscopy to be done at lab. Doesn't need UCx at this time.

## 2021-08-06 NOTE — Telephone Encounter (Signed)
Spoke with pt.  States he received Dr. Synthia Innocent message.  However, pt doesn't have sterile container so he'll come in to leave urine sample.  Also, pt will contact urology for appt.

## 2021-08-06 NOTE — Telephone Encounter (Signed)
My apologies.  I didn't even see that order.

## 2021-08-06 NOTE — Telephone Encounter (Signed)
Please have him drop off UA to send off for hematuria. Also recommend he call urology to schedule appointment for blood in urine.

## 2021-08-07 LAB — URINALYSIS, ROUTINE W REFLEX MICROSCOPIC
Bilirubin Urine: NEGATIVE
Ketones, ur: NEGATIVE
Leukocytes,Ua: NEGATIVE
Nitrite: NEGATIVE
Specific Gravity, Urine: 1.015 (ref 1.000–1.030)
Total Protein, Urine: NEGATIVE
Urine Glucose: NEGATIVE
Urobilinogen, UA: 0.2 (ref 0.0–1.0)
WBC, UA: NONE SEEN
pH: 7.5 (ref 5.0–8.0)

## 2021-08-09 ENCOUNTER — Other Ambulatory Visit: Payer: Self-pay | Admitting: Oncology

## 2021-08-11 ENCOUNTER — Encounter: Payer: Self-pay | Admitting: Oncology

## 2021-08-11 DIAGNOSIS — C778 Secondary and unspecified malignant neoplasm of lymph nodes of multiple regions: Secondary | ICD-10-CM | POA: Diagnosis not present

## 2021-08-11 DIAGNOSIS — C61 Malignant neoplasm of prostate: Secondary | ICD-10-CM | POA: Diagnosis not present

## 2021-08-11 DIAGNOSIS — R310 Gross hematuria: Secondary | ICD-10-CM | POA: Diagnosis not present

## 2021-08-19 ENCOUNTER — Ambulatory Visit: Payer: PPO | Admitting: Family Medicine

## 2021-08-25 ENCOUNTER — Other Ambulatory Visit: Payer: Self-pay | Admitting: *Deleted

## 2021-08-25 DIAGNOSIS — C61 Malignant neoplasm of prostate: Secondary | ICD-10-CM

## 2021-08-25 MED ORDER — ABIRATERONE ACETATE 250 MG PO TABS: 1000.0000 mg | ORAL_TABLET | Freq: Every day | ORAL | 10 refills | Status: DC

## 2021-08-26 ENCOUNTER — Encounter: Payer: Self-pay | Admitting: Family Medicine

## 2021-08-27 ENCOUNTER — Ambulatory Visit (INDEPENDENT_AMBULATORY_CARE_PROVIDER_SITE_OTHER): Payer: PPO | Admitting: Family Medicine

## 2021-08-27 ENCOUNTER — Encounter: Payer: Self-pay | Admitting: Family Medicine

## 2021-08-27 ENCOUNTER — Other Ambulatory Visit: Payer: Self-pay

## 2021-08-27 ENCOUNTER — Telehealth: Payer: Self-pay

## 2021-08-27 VITALS — BP 142/78 | HR 63 | Temp 97.3°F | Ht 72.0 in | Wt 199.0 lb

## 2021-08-27 DIAGNOSIS — I2699 Other pulmonary embolism without acute cor pulmonale: Secondary | ICD-10-CM | POA: Diagnosis not present

## 2021-08-27 DIAGNOSIS — S29012A Strain of muscle and tendon of back wall of thorax, initial encounter: Secondary | ICD-10-CM | POA: Diagnosis not present

## 2021-08-27 NOTE — Telephone Encounter (Signed)
I spoke with pt; pt is having pain in front and back of shoulder; no difference with pain upon any use or movement.pain in rt shoulder started 3 wks ago. Pt has hx of blood clot in rt lung. No CP and some SOB but pt said has had same SOB due to something to do with the cancer. No swelling, no redness and no warmth to skin at shoulder.  Pt does not feel in any distress at this moment and does not feel like needs to go to ED for eval. Pt wonders if could get blood test that Dr Darnell Level did last time to R/O blood clot. Pt will wait for cb.ED precautions given and pt voiced understanding. Sending note to Dr Darnell Level who is out of office, Dr Damita Dunnings who is in office and Lattie Haw CMA. Also sending teams to [place].

## 2021-08-27 NOTE — Telephone Encounter (Addendum)
Pt notified as instructed by Dr Darnell Level and pt voiced understanding.pt said [facility] was too far for pt to drive. Pt scheduled appt with Dr Einar Pheasant today at [facility]; pt will be at [facility] at 1:45 for ck in. Reviewed ED precautions again and pt voiced understanding. No covid symptoms or exposure per pt. Sending note to Dr Darnell Level, Dr Einar Pheasant and Valinda Hoar CMA.

## 2021-08-27 NOTE — Telephone Encounter (Signed)
See note from PCP.  Thanks.

## 2021-08-27 NOTE — Telephone Encounter (Signed)
H/o metastatic prostate cancer stable on zytiga + ADT H/o DVT and PE latest 06/2021 currently should be on eliquis for at least 3 months so I don't think current issue should be recurrent PE.  Appreciate Dr Einar Pheasant seeing this nice patient!

## 2021-08-27 NOTE — Telephone Encounter (Signed)
Goins, Felicia, CMA 5 minutes ago (10:03 AM)   Plz triage pt for possible blood clot.      Note    Brenton Grills, CMA  Muhammad, Vacca 15 minutes ago (9:53 AM)   Yes, we can wait to see what he says.  Dr. Darnell Level is out of the office today so we're waiting for his response.  Thank you for your patience.   Glorianne Manchester, CMA  This MyChart message has not been read.   Ivy Lynn, CMA 20 minutes ago (9:48 AM)   Could I wait and see what Dr. Darnell Level says. Last time this happened he had me do a blood test instead of a visit. [employer] is a long drive. I will do whatever he recommends. Please call me at [phone] Thanks Donnie Mesa, Solmon Ice, [place] 1 hour ago (8:10 AM)   i Mr. Bartoszek.  Please call our office at [phone] to schedule an appointment.  In the meantime, I have forwarded your message to Dr. Darnell Level.    Thank you, Glorianne Manchester, CMA    Brenton Grills, [place] routed conversation to [name], MD 2 hours ago (8:08 AM)   Cherlynn June  [facility] (supporting [name], MD) 15 hours ago (6:12 PM)   Dr. Darnell Level I feel like this may be nothing but I am having pain  behind my shoulder. This started 2 weeks ago. I assumed it was just a sore shoulder. I can move my shoulder with no pain so I started worrying about a clot. I really don't like contacting you for something unless it is important. Thanks for your advise. Demarco Bacci [phone]

## 2021-08-27 NOTE — Telephone Encounter (Signed)
Pt scheduled today at 2:00 with Dr. Einar Pheasant at [facility]. (See phn note, 08/27/21)

## 2021-08-27 NOTE — Progress Notes (Signed)
Subjective:     Kaiyu Mirabal is a 70 y.o. male presenting for possible blood clot     HPI  #Right Shoulder/upper back pain - started 2 weeks ago - lower than the shoulder blade - no changes in activity or known injury - worse: with sitting, nighttime - movement of shoulder or activity does not make it worse - better: tylenol improves pain  Pt is on eliquis for PE - never forgets to take this - prior blood clot was 7/19 - presentation - was dull pain which became worse with time - no breathing difficulty or fast heart rate  Endorses unchanged DOE   Review of Systems  Constitutional:  Negative for chills and fever.  Respiratory:  Negative for cough and shortness of breath.   Cardiovascular:  Negative for chest pain and palpitations.    Social History   Tobacco Use  Smoking Status Never  Smokeless Tobacco Never        Objective:    BP Readings from Last 3 Encounters:  08/27/21 (!) 142/78  06/30/21 140/84  06/25/21 (!) 146/86   Wt Readings from Last 3 Encounters:  08/27/21 199 lb (90.3 kg)  06/30/21 195 lb 1 oz (88.5 kg)  06/23/21 191 lb (86.6 kg)    BP (!) 142/78   Pulse 63   Temp (!) 97.3 F (36.3 C) (Temporal)   Ht 6' (1.829 m)   Wt 199 lb (90.3 kg)   SpO2 97%   BMI 26.99 kg/m    Physical Exam Constitutional:      Appearance: Normal appearance. He is not ill-appearing or diaphoretic.  HENT:     Right Ear: External ear normal.     Left Ear: External ear normal.     Nose: Nose normal.  Eyes:     General: No scleral icterus.    Extraocular Movements: Extraocular movements intact.     Conjunctiva/sclera: Conjunctivae normal.  Cardiovascular:     Rate and Rhythm: Normal rate and regular rhythm.     Heart sounds: No murmur heard. Pulmonary:     Effort: Pulmonary effort is normal. No respiratory distress.     Breath sounds: Normal breath sounds. No wheezing.  Musculoskeletal:     Cervical back: Neck supple.     Comments:  Shoulder Inspection: no swelling or abnormalities ROM: normal Strength: normal Palpation: pt notes area of pain is over the right shoulder blade and rhomboids though no ttp  No spinous ttp  Skin:    General: Skin is warm and dry.  Neurological:     Mental Status: He is alert. Mental status is at baseline.  Psychiatric:        Mood and Affect: Mood normal.          Assessment & Plan:   Problem List Items Addressed This Visit       Cardiovascular and Mediastinum   Acute pulmonary embolism ([place])    Currently on treatment for PE. Discussed in overall low concern for PE at this time as pt notes he has been adherence to eliquis and has not missed any doses. However, ER precautions if new or worsening symptoms        Musculoskeletal and Integument   Strain of rhomboid muscle - Primary    Symptoms seem more consistent with muscle related injury. Cont tylenol, try heat/massage. If worsening call back if no improvement provided update and could consider further evaluation.         Return if symptoms worsen or  fail to improve.  Lesleigh Noe, MD  This visit occurred during the SARS-CoV-2 public health emergency.  Safety protocols were in place, including screening questions prior to the visit, additional usage of staff PPE, and extensive cleaning of exam room while observing appropriate contact time as indicated for disinfecting solutions.

## 2021-08-27 NOTE — Assessment & Plan Note (Signed)
Currently on treatment for PE. Discussed in overall low concern for PE at this time as pt notes he has been adherence to eliquis and has not missed any doses. However, ER precautions if new or worsening symptoms

## 2021-08-27 NOTE — Telephone Encounter (Signed)
Will see today.  

## 2021-08-27 NOTE — Assessment & Plan Note (Signed)
Symptoms seem more consistent with muscle related injury. Cont tylenol, try heat/massage. If worsening call back if no improvement provided update and could consider further evaluation.

## 2021-08-27 NOTE — Telephone Encounter (Signed)
I do recommend in-office visit for further evaluation, would want him to be seen this week. Is there availability today? If not may place tomorrow at 12:30pm.

## 2021-08-27 NOTE — Telephone Encounter (Addendum)
Plz triage pt for possible blood clot.

## 2021-08-27 NOTE — Patient Instructions (Signed)
Likely a Rhomboid strain - increase tylenol to 3 times a day for 3-5 days - try icy/hot or heating pad or massage  Update me or Dr. Darnell Level if pain worsens

## 2021-09-03 ENCOUNTER — Other Ambulatory Visit: Payer: Self-pay

## 2021-09-03 ENCOUNTER — Inpatient Hospital Stay: Payer: PPO

## 2021-09-03 ENCOUNTER — Inpatient Hospital Stay: Payer: PPO | Attending: Oncology

## 2021-09-03 ENCOUNTER — Inpatient Hospital Stay: Payer: PPO | Admitting: Oncology

## 2021-09-03 VITALS — BP 147/74 | HR 61 | Temp 99.2°F | Resp 18 | Ht 72.0 in | Wt 200.5 lb

## 2021-09-03 DIAGNOSIS — C61 Malignant neoplasm of prostate: Secondary | ICD-10-CM | POA: Diagnosis not present

## 2021-09-03 DIAGNOSIS — R591 Generalized enlarged lymph nodes: Secondary | ICD-10-CM | POA: Diagnosis not present

## 2021-09-03 DIAGNOSIS — I700 Atherosclerosis of aorta: Secondary | ICD-10-CM | POA: Insufficient documentation

## 2021-09-03 DIAGNOSIS — R911 Solitary pulmonary nodule: Secondary | ICD-10-CM | POA: Insufficient documentation

## 2021-09-03 DIAGNOSIS — I10 Essential (primary) hypertension: Secondary | ICD-10-CM | POA: Insufficient documentation

## 2021-09-03 DIAGNOSIS — J90 Pleural effusion, not elsewhere classified: Secondary | ICD-10-CM | POA: Insufficient documentation

## 2021-09-03 DIAGNOSIS — N200 Calculus of kidney: Secondary | ICD-10-CM | POA: Insufficient documentation

## 2021-09-03 LAB — CBC WITH DIFFERENTIAL (CANCER CENTER ONLY)
Abs Immature Granulocytes: 0.07 10*3/uL (ref 0.00–0.07)
Basophils Absolute: 0 10*3/uL (ref 0.0–0.1)
Basophils Relative: 1 %
Eosinophils Absolute: 0.3 10*3/uL (ref 0.0–0.5)
Eosinophils Relative: 6 %
HCT: 38.1 % — ABNORMAL LOW (ref 39.0–52.0)
Hemoglobin: 13 g/dL (ref 13.0–17.0)
Immature Granulocytes: 1 %
Lymphocytes Relative: 23 %
Lymphs Abs: 1.4 10*3/uL (ref 0.7–4.0)
MCH: 32.1 pg (ref 26.0–34.0)
MCHC: 34.1 g/dL (ref 30.0–36.0)
MCV: 94.1 fL (ref 80.0–100.0)
Monocytes Absolute: 0.5 10*3/uL (ref 0.1–1.0)
Monocytes Relative: 8 %
Neutro Abs: 3.6 10*3/uL (ref 1.7–7.7)
Neutrophils Relative %: 61 %
Platelet Count: 206 10*3/uL (ref 150–400)
RBC: 4.05 MIL/uL — ABNORMAL LOW (ref 4.22–5.81)
RDW: 12.5 % (ref 11.5–15.5)
WBC Count: 5.8 10*3/uL (ref 4.0–10.5)
nRBC: 0 % (ref 0.0–0.2)

## 2021-09-03 LAB — CMP (CANCER CENTER ONLY)
ALT: 21 U/L (ref 0–44)
AST: 24 U/L (ref 15–41)
Albumin: 4 g/dL (ref 3.5–5.0)
Alkaline Phosphatase: 48 U/L (ref 38–126)
Anion gap: 10 (ref 5–15)
BUN: 22 mg/dL (ref 8–23)
CO2: 22 mmol/L (ref 22–32)
Calcium: 9.6 mg/dL (ref 8.9–10.3)
Chloride: 110 mmol/L (ref 98–111)
Creatinine: 1.3 mg/dL — ABNORMAL HIGH (ref 0.61–1.24)
GFR, Estimated: 59 mL/min — ABNORMAL LOW (ref >60)
Glucose, Bld: 102 mg/dL — ABNORMAL HIGH (ref 70–99)
Potassium: 3.6 mmol/L (ref 3.5–5.1)
Sodium: 142 mmol/L (ref 135–145)
Total Bilirubin: 1 mg/dL (ref 0.3–1.2)
Total Protein: 6.9 g/dL (ref 6.5–8.1)

## 2021-09-03 MED ORDER — LEUPROLIDE ACETATE (4 MONTH) 30 MG ~~LOC~~ KIT
30.0000 mg | PACK | Freq: Once | SUBCUTANEOUS | Status: AC
  Administered 2021-09-03: 30 mg via SUBCUTANEOUS
  Filled 2021-09-03: qty 30

## 2021-09-03 NOTE — Progress Notes (Signed)
Hematology and Oncology Follow Up Visit  Shane [name] [id number] August 26, 1951 70 y.o. 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  He was found to have Gleason score of 5+4 = 9 and tumor invasion into the bladder.   Prior Therapy:  He is status post TURBT and stent placement bilaterally the pathology showed Gleason score 5+4 = 9 prostate cancer invading into the bladder.  Current therapy:   Eligard 30 mg every 4 months.   Next Eligard will be given on 09/03/2021.  Zytiga 1000 mg daily with prednisone 5 mg started in September 2020.  Interim History: Shane [name] is here for repeat evaluation.  Since her last visit, he was diagnosed with right lower lobe segmental pulmonary embolism in July 2022.  He presented with acute flank pain and CT scan of the chest showed small pulmonary embolism involving the posterior right lower lobe without any evidence of heart strain.  He is currently on Eliquis since that time.  Clinically, he reports feeling well without any major complaints.  He denies any chest pain shortness of breath.  He denies any difficulty breathing.  He denies any specific provoking symptoms preceding his blood clot.  He was diagnosed with COVID-19 although does not recall exactly when.  He denies any personal or family history of thrombosis.        Medications: Reviewed without changes. Current Outpatient Medications  Medication Sig Dispense Refill   abiraterone acetate (ZYTIGA) 250 MG tablet Take 4 tablets (1,000 mg total) by mouth daily. Take on an empty stomach 1 hour before or 2 hours after a meal 120 tablet 10   apixaban (ELIQUIS) 5 MG TABS tablet Take 1 tablet (5 mg total) by mouth 2 (two) times daily. 60 tablet 1   APIXABAN (ELIQUIS) VTE STARTER PACK (10MG  AND 5MG ) Take as directed on package: start  with two-5mg  tablets twice daily for 7 days. On day 8, switch to one-5mg  tablet twice daily. 1 each 0   atorvastatin (LIPITOR) 20 MG tablet Take 1 tablet (20 mg total) by mouth daily. 90 tablet 3   Calcium-Magnesium-Vitamin D (CITRACAL SLOW RELEASE PO) Take 2 tablets by mouth daily.     HYDROcodone-acetaminophen (NORCO/VICODIN) 5-325 MG tablet Take 1 tablet by mouth every 6 (six) hours as needed. 10 tablet 0   Leuprolide Acetate, 4 Month, (ELIGARD) 30 MG injection Inject 30 mg into the skin every 4 (four) months.     Mirabegron (MYRBETRIQ PO) Take 1 tablet by mouth daily.     predniSONE (DELTASONE) 5 MG tablet TAKE ONE TABLET BY MOUTH DAILY WITH BREAKFAST 90 tablet 0   venlafaxine (EFFEXOR) 25 MG tablet Take 2 tablets (50 mg total) by mouth 2 (two) times daily.     No current facility-administered medications for this visit.     Allergies:  Allergies  Allergen Reactions   Bactrim [Sulfamethoxazole-Trimethoprim] Hives    Red rash, hives   Hydrocodone    Percocet [Oxycodone-Acetaminophen] Other (See Comments)    TURNS RED FROM CHEST UP, LOOKS LIKE SUNBURN AND STARTS TO ITCH BADLY   Vancomycin    Penicillins Swelling and Rash    Did it involve swelling of the face/tongue/throat, SOB, or low BP? No Did it involve sudden or severe rash/hives, skin peeling, or any reaction on the inside of your mouth or nose? No Did you need to seek medical attention at a hospital  or doctor's office? No When did it last happen?      50 years ago If all above answers are "NO", may proceed with cephalosporin use.    Zithromax [Azithromycin] Rash        Physical Exam:    Blood pressure (!) 147/74, pulse 61, temperature 99.2 F (37.3 C), temperature source Oral, resp. rate 18, height 6' (1.829 m), weight 200 lb 8 oz (90.9 kg), SpO2 99 %.      ECOG: 0    General appearance: Alert, awake without any distress. Head: Atraumatic without abnormalities Oropharynx: Without any thrush or  ulcers. Eyes: No scleral icterus. Lymph nodes: No lymphadenopathy noted in the cervical, supraclavicular, or axillary nodes Heart:regular rate and rhythm, without any murmurs or gallops.   Lung: Clear to auscultation without any rhonchi, wheezes or dullness to percussion. Abdomin: Soft, nontender without any shifting dullness or ascites. Musculoskeletal: No clubbing or cyanosis. Neurological: No motor or sensory deficits. Skin: No rashes or lesions.            Lab Results: Lab Results  Component Value Date   WBC 8.0 06/25/2021   HGB 12.0 (L) 06/25/2021   HCT 36.0 (L) 06/25/2021   MCV 96.0 06/25/2021   PLT 220 06/25/2021     Chemistry      Component Value Date/Time   NA 138 06/25/2021 1218   NA 139 02/18/2021 1236   K 3.7 06/25/2021 1218   CL 105 06/25/2021 1218   CO2 27 06/25/2021 1218   BUN 20 06/25/2021 1218   BUN 22 02/18/2021 1236   CREATININE 1.10 06/25/2021 1218   CREATININE 1.18 05/01/2021 0846   GLU 113 01/08/2020 0000      Component Value Date/Time   CALCIUM 9.5 06/25/2021 1218   ALKPHOS 48 06/25/2021 1218   AST 22 06/25/2021 1218   AST 31 05/01/2021 0846   ALT 22 06/25/2021 1218   ALT 26 05/01/2021 0846   BILITOT 0.7 06/25/2021 1218   BILITOT 0.6 05/01/2021 0846       Results for Shane [name] (MRN [id number]) as of 09/03/2021 09:22  Ref. Range 01/02/2021 13:11 05/01/2021 08:46  Prostate Specific Ag, Serum Latest Ref Range: 0.0 - 4.0 ng/mL <0.1 <0.1     IMPRESSION: Small right-sided pleural effusion is noted. Mild dependent atelectatic changes are seen. No focal confluent infiltrate is noted.   Stable nonobstructing left lower pole renal stone.   Stable nodule in the right lower lobe.  IMPRESSION: Lobar/segmental pulmonary embolism in the posterior right lower lobe. Overall clot burden is small. No evidence of right heart strain.   Possible early/developing pulmonary infarct in the posterior right lower lobe, equivocal. Trace right  pleural effusion.   Aortic Atherosclerosis (ICD10-I70.0).   Critical Value/emergent results were called by telephone at the time of interpretation on 06/23/2021 at 11:06 pm to provider Shane [name] , who verbally acknowledged these results.     Impression and Plan:   70 year old with:   1.    Castration-sensitive advanced prostate cancer with lymphadenopathy diagnosed in July 2020.    His disease status was updated at this time treatment choices were reviewed.  His PSA continues to be undetectable with imaging studies including CT scan of the chest as well as the pelvis in July 2022 continues to show no evidence of disease.  Risks and benefits of continuing Zytiga were discussed at this time.  Complications including hypertension, edema and hypokalemia were reiterated.  He is agreeable to continue at this time.  2.    Bone directed therapy: I recommended calcium and vitamin D supplements.   3.  Pulmonary embolism: Unclear etiology at this time.  Provoking factors including COVID-19 infection and malignancy.  I recommended continued Eliquis and we will reevaluate in 4 months.  6 months duration of anticoagulation versus lifetime will be discussed at that time.   4.  Androgen deprivation: I recommended continuing this indefinitely.  He will receive Eligard today and repeated in 4 months.  5.  Hypertension: Blood pressure is close to normal range at this time.  We will continue to monitor on Zytiga.   6.  Follow-up: He will return in 4 months for repeat evaluation.   30 minutes were spent on this encounter.  The time was dedicated to reviewing laboratory data, disease status update, treatment choices and future plan of care review.  Zola Button, MD 9/29/20229:22 AM

## 2021-09-04 ENCOUNTER — Telehealth: Payer: Self-pay | Admitting: *Deleted

## 2021-09-04 LAB — PROSTATE-SPECIFIC AG, SERUM (LABCORP): Prostate Specific Ag, Serum: <0.1 ng/mL (ref 0.0–4.0)

## 2021-09-04 NOTE — Telephone Encounter (Signed)
-----   Message from [name], MD sent at 09/04/2021  9:38 AM EDT ----- Please let him know his PSA is still low

## 2021-09-04 NOTE — Telephone Encounter (Signed)
Per Dr.Shadad, called pt with message below. Pt was appreciative and verbalized understanding.

## 2021-09-21 ENCOUNTER — Telehealth: Payer: Self-pay | Admitting: Family Medicine

## 2021-09-21 NOTE — Telephone Encounter (Signed)
Pt called stating is it ok to stop taking his blood thinners APIXABAN (ELIQUIS) VTE STARTER PACK (10MG  AND 5MG . Please advise.

## 2021-09-22 ENCOUNTER — Encounter: Payer: Self-pay | Admitting: Family Medicine

## 2021-09-22 MED ORDER — APIXABAN 5 MG PO TABS: 5.0000 mg | ORAL_TABLET | Freq: Two times a day (BID) | ORAL | 3 refills | Status: DC

## 2021-09-22 NOTE — Telephone Encounter (Addendum)
From last oncology note, plan was to continue eliquis through 12/2020 and then to discuss further steps at next oncology office visit.  I have refilled eliquis for him to [facility].

## 2021-09-22 NOTE — Telephone Encounter (Signed)
Pt returning call.  I relayed Dr. Synthia Innocent message.  Pt verbalizes understanding and will discuss with oncologist.

## 2021-09-22 NOTE — Telephone Encounter (Signed)
Spoke with pt.  (See phn note, 09/21/21)

## 2021-09-22 NOTE — Addendum Note (Signed)
Addended by: [name] on: 09/22/2021 07:43 AM   Modules accepted: Orders

## 2021-09-22 NOTE — Telephone Encounter (Signed)
Lvm asking pt to call back.  Need to relay Dr. G's message.  

## 2021-09-23 ENCOUNTER — Encounter: Payer: Self-pay | Admitting: Oncology

## 2021-09-23 ENCOUNTER — Telehealth: Payer: Self-pay

## 2021-09-23 ENCOUNTER — Other Ambulatory Visit (HOSPITAL_COMMUNITY): Payer: Self-pay

## 2021-09-23 NOTE — Telephone Encounter (Signed)
Oral Oncology Patient Advocate Encounter  Was successful in securing patient a $8000 grant from [employer] to provide copayment coverage for Zytiga.  This will keep the out of pocket expense at $0.     [employer] ID: [id number]  I have spoken with the patient.   The billing information is as follows and has been shared with [place]: [id number] PCN: [id number] Member ID: [id number] Group ID: [id number] Dates of Eligibility: 08/24/21 through 08/23/22  Fund:  [place] Patient [place] Phone [phone] Fax [phone] 09/23/2021 3:44 PM

## 2021-09-25 ENCOUNTER — Telehealth: Payer: Self-pay | Admitting: Oncology

## 2021-09-25 NOTE — Telephone Encounter (Signed)
Scheduled per 09/29 los, patient has been called and voicemail.

## 2021-10-07 ENCOUNTER — Other Ambulatory Visit (HOSPITAL_COMMUNITY): Payer: Self-pay

## 2021-10-09 ENCOUNTER — Telehealth: Payer: Self-pay

## 2021-10-09 ENCOUNTER — Other Ambulatory Visit (HOSPITAL_COMMUNITY): Payer: Self-pay

## 2021-10-09 ENCOUNTER — Encounter: Payer: Self-pay | Admitting: Oncology

## 2021-10-09 NOTE — Telephone Encounter (Signed)
Oral Oncology Patient Advocate Encounter   Was successful in securing patient an [phone] grant from Patient [place] [facility]) to provide copayment coverage for Zytiga.  This will keep the out of pocket expense at $0.     I have spoken with the patient.    The billing information is as follows and has been shared with [place].   Member ID: [id number] Group ID: [id number] RxBin: [id number] Dates of Eligibility: 07/11/21 through 10/08/22  Fund:  [place] Patient [place] Phone [phone] Fax [phone] 10/09/2021 1:47 PM

## 2021-10-19 ENCOUNTER — Telehealth: Payer: Self-pay | Admitting: Family Medicine

## 2021-10-19 NOTE — Chronic Care Management (AMB) (Signed)
  Chronic Care Management   Outreach Note  10/19/2021 Name: Shane [name] MRN: [id number] DOB: Aug 31, 1951  Referred by: [name], MD Reason for referral : No chief complaint on file.   An unsuccessful telephone outreach was attempted today. The patient was referred to the pharmacist for assistance with care management and care coordination.   Follow Up Plan:   Tatjana Dellinger Upstream Scheduler

## 2021-10-19 NOTE — Progress Notes (Signed)
  Chronic Care Management   Note  10/19/2021 Name: Shane [name] MRN: [id number] DOB: 1951-02-23  Shane [name] is a 70 y.o. year old male who is a primary care patient of [name], MD. I reached out to Cherlynn June by phone today in response to a referral sent by Mr. Rishawn Walck PCP, [name], MD.   [name] was given information about Chronic Care Management services today including:  CCM service includes personalized support from designated clinical staff supervised by his physician, including individualized plan of care and coordination with other care providers 24/7 contact phone numbers for assistance for urgent and routine care needs. Service will only be billed when office clinical staff spend 20 minutes or more in a month to coordinate care. Only one practitioner may furnish and bill the service in a calendar month. The patient may stop CCM services at any time (effective at the end of the month) by phone call to the office staff.   Patient agreed to services and verbal consent obtained.   Follow up plan:   Tatjana Secretary/administrator

## 2021-10-27 ENCOUNTER — Telehealth: Payer: Self-pay | Admitting: Family Medicine

## 2021-10-27 ENCOUNTER — Other Ambulatory Visit: Payer: Self-pay

## 2021-10-27 ENCOUNTER — Encounter: Payer: Self-pay | Admitting: Family Medicine

## 2021-10-27 ENCOUNTER — Telehealth (INDEPENDENT_AMBULATORY_CARE_PROVIDER_SITE_OTHER): Payer: PPO | Admitting: Family Medicine

## 2021-10-27 VITALS — BP 153/76 | HR 71 | Temp 99.8°F | Ht 72.0 in | Wt 198.0 lb

## 2021-10-27 DIAGNOSIS — U071 COVID-19: Secondary | ICD-10-CM | POA: Diagnosis not present

## 2021-10-27 DIAGNOSIS — I2510 Atherosclerotic heart disease of native coronary artery without angina pectoris: Secondary | ICD-10-CM | POA: Diagnosis not present

## 2021-10-27 DIAGNOSIS — R030 Elevated blood-pressure reading, without diagnosis of hypertension: Secondary | ICD-10-CM

## 2021-10-27 DIAGNOSIS — I10 Essential (primary) hypertension: Secondary | ICD-10-CM | POA: Insufficient documentation

## 2021-10-27 DIAGNOSIS — C61 Malignant neoplasm of prostate: Secondary | ICD-10-CM

## 2021-10-27 HISTORY — DX: COVID-19: U07.1

## 2021-10-27 MED ORDER — MOLNUPIRAVIR EUA 200MG CAPSULE: 4.0000 | ORAL_CAPSULE | Freq: Two times a day (BID) | ORAL | 0 refills | Status: AC

## 2021-10-27 NOTE — Telephone Encounter (Signed)
Shane [name] called in and stated that Shane [name] tested positive 15 mins ago and he has congestion, and fever and wanted to know if Dr. Darnell Level can call in the prescription due to he is going through chemo treatment and hormone therapy.

## 2021-10-27 NOTE — Assessment & Plan Note (Signed)
Pt states latest is in remission from prostate cancer!  Continues zytiga and leuprolide.

## 2021-10-27 NOTE — Assessment & Plan Note (Signed)
Advised he start monitoring blood pressures closely at home and let me know if consistently elevated to further address. In interim, encouraged good water intake, avoid salt/sodium in diet, increase fruits/vegetables and whole grains in diet.

## 2021-10-27 NOTE — Telephone Encounter (Signed)
Plz place at 4:30pm, pt may have to wait as I'm behind.

## 2021-10-27 NOTE — Telephone Encounter (Signed)
Dr. Darnell Level, where can we add pt?

## 2021-10-27 NOTE — Progress Notes (Signed)
Patient ID: Shane [name], male    DOB: Feb 03, 1951, 70 y.o.   MRN: [id number]  Virtual visit completed through [employer], a video enabled telemedicine application. Due to national recommendations of social distancing due to COVID-19, a virtual visit is felt to be most appropriate for this patient at this time. Reviewed limitations, risks, security and privacy concerns of performing a virtual visit and the availability of in person appointments. I also reviewed that there may be a patient responsible charge related to this service. The patient agreed to proceed.   Patient location: home Provider location: [place] [facility], office Persons participating in this virtual visit: patient, provider   If any vitals were documented, they were collected by patient at home unless specified below.    BP (!) 153/76   Pulse 71   Temp 99.8 F (37.7 C)   Ht 6' (1.829 m)   Wt 198 lb (89.8 kg)   BMI 26.85 kg/m   BP Readings from Last 3 Encounters:  10/27/21 (!) 153/76  09/03/21 (!) 147/74  08/27/21 (!) 142/78    CC: COVID infection Subjective:   HPI: Shane [name] is a 70 y.o. male presenting on 10/27/2021 for Covid Positive (C/o fever- max 100.1, chest congestion, body aches and cough.  Sxs started 10/26/21.  Pos home COVID test today.  Last COVID booster and flu shot- 10/02/21.)   First day of symptoms: 10/26/2021 Tested COVID positive: 10/27/2021  Current symptoms: low grade temperature, chest congestion, body aches esp at shins, productive cough.  No: ear or tooth pain, loss of taste or smell, dyspnea, wheezing, abd pain, nausea, diarrhea.  Treatments to date: nyquil  Risk factors include: age, cancer on chemo (Zytiga and Eligard)  Wife also sick 1 wk ago, symptoms fully resolved.  Had COVID infection several months ago, symptoms fully resolved.   COVID vaccination status: Pfizer x5, latest bivalent booster 09/2021      Relevant past medical, surgical, family and social  history reviewed and updated as indicated. Interim medical history since our last visit reviewed. Allergies and medications reviewed and updated. Outpatient Medications Prior to Visit  Medication Sig Dispense Refill   abiraterone acetate (ZYTIGA) 250 MG tablet Take 4 tablets (1,000 mg total) by mouth daily. Take on an empty stomach 1 hour before or 2 hours after a meal 120 tablet 10   apixaban (ELIQUIS) 5 MG TABS tablet Take 1 tablet (5 mg total) by mouth 2 (two) times daily. 60 tablet 3   atorvastatin (LIPITOR) 20 MG tablet Take 1 tablet (20 mg total) by mouth daily. 90 tablet 3   Calcium-Magnesium-Vitamin D (CITRACAL SLOW RELEASE PO) Take 2 tablets by mouth daily.     HYDROcodone-acetaminophen (NORCO/VICODIN) 5-325 MG tablet Take 1 tablet by mouth every 6 (six) hours as needed. 10 tablet 0   Leuprolide Acetate, 4 Month, (ELIGARD) 30 MG injection Inject 30 mg into the skin every 4 (four) months.     Mirabegron (MYRBETRIQ PO) Take 1 tablet by mouth daily.     predniSONE (DELTASONE) 5 MG tablet TAKE ONE TABLET BY MOUTH DAILY WITH BREAKFAST 90 tablet 0   venlafaxine (EFFEXOR) 25 MG tablet Take 2 tablets (50 mg total) by mouth 2 (two) times daily.     venlafaxine (EFFEXOR) 25 MG tablet Take 1 tablet (25 mg total) by mouth 2 (two) times daily.     No facility-administered medications prior to visit.     Per HPI unless specifically indicated in ROS section below Review of  Systems Objective:  BP (!) 153/76   Pulse 71   Temp 99.8 F (37.7 C)   Ht 6' (1.829 m)   Wt 198 lb (89.8 kg)   BMI 26.85 kg/m   Wt Readings from Last 3 Encounters:  10/27/21 198 lb (89.8 kg)  09/03/21 200 lb 8 oz (90.9 kg)  08/27/21 199 lb (90.3 kg)       Physical exam: Gen: alert, NAD, not ill appearing Pulm: speaks in complete sentences without increased work of breathing, intermittent cough present Psych: normal mood, normal thought content      Lab Results  Component Value Date   CREATININE 1.30 (H)  09/03/2021   BUN 22 09/03/2021   NA 142 09/03/2021   K 3.6 09/03/2021   CL 110 09/03/2021   CO2 22 09/03/2021   GFR = 59  Assessment & Plan:   Problem List Items Addressed This Visit     Malignant neoplasm of prostate ([place])    Pt states latest is in remission from prostate cancer!  Continues zytiga and leuprolide.       Relevant Medications   molnupiravir EUA (LAGEVRIO) 200 mg CAPS capsule   CAD (coronary artery disease)   COVID-19 virus infection - Primary    Reviewed currently approved EUA treatments.  Reviewed expected course of illness, anticipated course of recovery, as well as red flags to suggest COVID pneumonia and/or to seek urgent in-person care.  Reviewed latest CDC isolation/quarantine guidelines.  Encouraged fluids and rest. Reviewed further supportive care measures at home including vit C 500mg  bid, vit D 2000 IU daily, zinc 100mg  daily, tylenol PRN, pepcid 20mg  BID PRN.   Recommend:  molnupiravir given drug interactions with paxlovid. Reviewed watching for urticarial rash Paxlovid drug interactions:  Apixiban, atorvastatin      Relevant Medications   molnupiravir EUA (LAGEVRIO) 200 mg CAPS capsule   Elevated blood pressure reading    Advised he start monitoring blood pressures closely at home and let me know if consistently elevated to further address. In interim, encouraged good water intake, avoid salt/sodium in diet, increase fruits/vegetables and whole grains in diet.         Meds ordered this encounter  Medications   molnupiravir EUA (LAGEVRIO) 200 mg CAPS capsule    Sig: Take 4 capsules (800 mg total) by mouth 2 (two) times daily for 5 days.    Dispense:  40 capsule    Refill:  0    No orders of the defined types were placed in this encounter.   I discussed the assessment and treatment plan with the patient. The patient was provided an opportunity to ask questions and all were answered. The patient agreed with the plan and demonstrated an  understanding of the instructions. The patient was advised to call back or seek an in-person evaluation if the symptoms worsen or if the condition fails to improve as anticipated.  Follow up plan: No follow-ups on file.  [name], MD

## 2021-10-27 NOTE — Assessment & Plan Note (Signed)
Reviewed currently approved EUA treatments.  Reviewed expected course of illness, anticipated course of recovery, as well as red flags to suggest COVID pneumonia and/or to seek urgent in-person care.  Reviewed latest CDC isolation/quarantine guidelines.  Encouraged fluids and rest. Reviewed further supportive care measures at home including vit C 500mg  bid, vit D 2000 IU daily, zinc 100mg  daily, tylenol PRN, pepcid 20mg  BID PRN.   Recommend:  molnupiravir given drug interactions with paxlovid. Reviewed watching for urticarial rash Paxlovid drug interactions:  Apixiban, atorvastatin

## 2021-10-27 NOTE — Telephone Encounter (Signed)
Spoke with pt getting him ready and informed Dr. Darnell Level will be running late.

## 2021-11-09 ENCOUNTER — Other Ambulatory Visit: Payer: Self-pay | Admitting: Oncology

## 2021-11-13 ENCOUNTER — Telehealth: Payer: Self-pay

## 2021-11-13 NOTE — Chronic Care Management (AMB) (Signed)
Chronic Care Management Pharmacy Assistant   Name: Shane [name]  MRN: [id number] DOB: 1951/07/09  Shane [name] is an 70 y.o. year old male who presents for his initial CCM visit with the clinical pharmacist.  Reason for Encounter: Initial Questions   Conditions to be addressed/monitored: CAD and HLD   Recent office visits:  10/27/21-PCP-[name],MD-Telemedicine-Patient presented for body aches, fever, cough,positive covid home test. [name] x5, latest bivalent booster 09/2021 )Encouraged fluids and rest. Reviewed further supportive care measures at home including vit C 500mg  bid, vit D 2000 IU daily, zinc 100mg  daily, tylenol PRN, pepcid 20mg  BID PRN.  Start Molnupiravir 200mg .Take 4 capsules  2 times daily for 5 days  08/27/21-Family Medicine-[name],MD- Patient presented for possible blood clot.Patient already on blood thinner, use heat,massage to area, use tylenol,ER  if symptoms worsen. 06/30/21-PCP-[name],MD-Patient presented for follow up hospital stay. Start torvastatin 20mg  take 1 tablet daily  , just started Eliquis 5mg   take 1 tablet 2 times daily Referral to ophthalmology,referral for carotid ultrasound.  Recent consult visits:  09/03/21-Oncology-[name],MD-Patient presented for follow up prostate cancer. Discussed PE seen on previous scans, stable. I recommended calcium and vitamin D supplements. Eligard 30mg  given. 08/11/21-Urology-[name],-no data found 07/22/21-Cardiology-[name],MD-no data found    Hospital visits:  06/25/21-[facility] ED-[name],MD-Patient presented for flank pain.Labs,EKG,Xrays,CT-scan,given hydrocodone 5/325mg  (short course) for pain and discharged to home-No admission 06/23/21 thru 06/24/21-[facility] ED-[name],MD-Patient presented for chest pain.Labs EKG,Xrays,CT scan,work up for blood clot and discharged to home   Medications: Outpatient Encounter Medications as of 11/13/2021  Medication Sig    abiraterone acetate (ZYTIGA) 250 MG tablet Take 4 tablets (1,000 mg total) by mouth daily. Take on an empty stomach 1 hour before or 2 hours after a meal   apixaban (ELIQUIS) 5 MG TABS tablet Take 1 tablet (5 mg total) by mouth 2 (two) times daily.   atorvastatin (LIPITOR) 20 MG tablet Take 1 tablet (20 mg total) by mouth daily.   Calcium-Magnesium-Vitamin D (CITRACAL SLOW RELEASE PO) Take 2 tablets by mouth daily.   HYDROcodone-acetaminophen (NORCO/VICODIN) 5-325 MG tablet Take 1 tablet by mouth every 6 (six) hours as needed.   Leuprolide Acetate, 4 Month, (ELIGARD) 30 MG injection Inject 30 mg into the skin every 4 (four) months.   Mirabegron (MYRBETRIQ PO) Take 1 tablet by mouth daily.   predniSONE (DELTASONE) 5 MG tablet TAKE ONE TABLET BY MOUTH DAILY WITH BREAKFAST   venlafaxine (EFFEXOR) 25 MG tablet Take 1 tablet (25 mg total) by mouth 2 (two) times daily.   No facility-administered encounter medications on file as of 11/13/2021.     No results found for: HGBA1C, MICROALBUR   BP Readings from Last 3 Encounters:  10/27/21 (!) 153/76  09/03/21 (!) 147/74  08/27/21 (!) 142/78    Patient contacted to review initial questions prior to visit with [name].  Have you seen any other providers since your last visit with PCP? No  Any changes in your medications or health? Yes The patient is in remission from prostate cancer.  Any side effects from any medications? No  Do you have an symptoms or problems not managed by your medications? No  Any concerns about your health right now? No  Has your provider asked that you check blood pressure, blood sugar, or follow special diet at home? Yes  he has a BP monitor at home.  Do you get any type of exercise on a regular basis? No  Can you think  of a goal you would like to reach for your health? No  Do you have any problems getting your medications? No  Uses [name] and with [facility] has small copay, however, he is in the donut  hole at this time .   Is there anything that you would like to discuss during the appointment? Yes The patient did say he had a few medications he wanted to discuss   Spoke with patient and reminded them to have all medications, supplements and any blood glucose and blood pressure readings available for review with pharmacist, at their telephone visit on 11/19/21 at [facility]:  Medication:  Last Fill: Day Supply Atorvastatin 20mg  09/23/21 90   Care Gaps: Annual wellness visit in last year? Yes Most Recent BP reading:147/74    61-P  09/03/21   [name], CPP notified  [name], [place] Assistant [phone]  Total time spent for month CPA: 40 min.

## 2021-11-19 ENCOUNTER — Other Ambulatory Visit: Payer: Self-pay

## 2021-11-19 ENCOUNTER — Ambulatory Visit (INDEPENDENT_AMBULATORY_CARE_PROVIDER_SITE_OTHER): Payer: PPO | Admitting: Pharmacist

## 2021-11-19 VITALS — BP 124/74

## 2021-11-19 DIAGNOSIS — R030 Elevated blood-pressure reading, without diagnosis of hypertension: Secondary | ICD-10-CM

## 2021-11-19 DIAGNOSIS — I2510 Atherosclerotic heart disease of native coronary artery without angina pectoris: Secondary | ICD-10-CM

## 2021-11-19 DIAGNOSIS — R232 Flushing: Secondary | ICD-10-CM

## 2021-11-19 DIAGNOSIS — T50905A Adverse effect of unspecified drugs, medicaments and biological substances, initial encounter: Secondary | ICD-10-CM

## 2021-11-19 DIAGNOSIS — E7800 Pure hypercholesterolemia, unspecified: Secondary | ICD-10-CM

## 2021-11-19 DIAGNOSIS — I2699 Other pulmonary embolism without acute cor pulmonale: Secondary | ICD-10-CM

## 2021-11-19 DIAGNOSIS — C61 Malignant neoplasm of prostate: Secondary | ICD-10-CM

## 2021-11-19 NOTE — Progress Notes (Signed)
Chronic Care Management Pharmacy Note  11/20/2021 Name:  Shane [name] MRN:  [id number] DOB:  [birth year]  Summary: -Pt reports hot flashes due to prostate cancer treatment are still bothersome; he is currently taking immediate-release venlafaxine 75 mg/day and reports it is wearing off later in the day. Dr [name] (urology) previously prescribed this but pt is only seeing him PRN now  Recommendations/Changes made from today's visit: -Recommend change venlafaxine to ER and increase to 150 mg/day - consulting with PCP for Rx  Plan: -[place] will call patient 1 month f/u hot flashes/venlafaxine -Pharmacist follow up televisit scheduled for 6 months   Subjective: Shane [name] is an 70 y.o. year old male who is a primary patient of [name], MD.  The CCM team was consulted for assistance with disease management and care coordination needs.    Engaged with patient by telephone for initial visit in response to provider referral for pharmacy case management and/or care coordination services.   Consent to Services:  The patient was given the following information about Chronic Care Management services today, agreed to services, and gave verbal consent: 1. CCM service includes personalized support from designated clinical staff supervised by the primary care provider, including individualized plan of care and coordination with other care providers 2. 24/7 contact phone numbers for assistance for urgent and routine care needs. 3. Service will only be billed when office clinical staff spend 20 minutes or more in a month to coordinate care. 4. Only one practitioner may furnish and bill the service in a calendar month. 5.The patient may stop CCM services at any time (effective at the end of the month) by phone call to the office staff. 6. The patient will be responsible for cost sharing (co-pay) of up to 20% of the service fee (after annual deductible is met). Patient agreed to  services and consent obtained.  Patient Care Team: [name], MD as PCP - General (Family Medicine) Cira Rue, RN Nurse Navigator as Registered Nurse (Medical Oncology) Charlton Haws, [facility] as Pharmacist (Pharmacist)  Recent office visits: 10/27/21-PCP-[name],MD-Telemedicine-Patient presented for body aches, fever, cough,positive covid home test. [name] x5, latest bivalent booster 09/2021 )Encouraged fluids and rest. Reviewed further supportive care measures at home including vit C 548m bid, vit D 2000 IU daily, zinc 1056mdaily, tylenol PRN, pepcid 2052mID PRN.  Start Molnupiravir 200m64mke 4 capsules  2 times daily for 5 days  08/27/21-Family Medicine-[name],MD- Patient presented for possible blood clot.Patient already on blood thinner, use heat,massage to area, use tylenol,ER  if symptoms worsen. 06/30/21-PCP-[name],MD-Patient presented for follow up hospital stay. Start atorvastatin 20mg31me 1 tablet daily  , just started Eliquis 5mg  88me 1 tablet 2 times daily. Referral to ophthalmology,referral for carotid ultrasound.  Recent consult visits: 09/03/21-Oncology-[name],MD-Patient presented for follow up prostate cancer. Discussed PE seen on previous scans, stable. Recommended calcium and vitamin D supplements. Eligard 30mg g28m. 08/11/21-Urology-[name],-no data found 07/22/21-Cardiology-[name],MD-no data found  Hospital visits: Medication Reconciliation was completed by comparing discharge summary, patients EMR and Pharmacy list, and upon discussion with patient.  Admitted to the ED on 06/25/21 due to Pleuritic chest pain. Discharge date was 06/25/21. [facility] -started Hydrocodone-APAP 5-325 mg due to pain  Medications that remain the same after Hospital Discharge:??  -All other medications will remain the same.    Admitted to the ED on  06/23/21 due to Acute PE. Discharge  date was 06/24/21. Discharged from [facility] ED    New?Medications Started at [facility] Discharge:?? -started Eliquis 10 mg BID x 7 days. Then 5 mg BID   Medications that remain the same after Hospital Discharge:??  -All other medications will remain the same.     Objective:  Lab Results  Component Value Date   CREATININE 1.30 (H) 09/03/2021   BUN 22 09/03/2021   GFR 59.39 (L) 04/27/2021   GFRNONAA 59 (L) 09/03/2021   GFRAA >60 09/03/2020   NA 142 09/03/2021   K 3.6 09/03/2021   CALCIUM 9.6 09/03/2021   CO2 22 09/03/2021   GLUCOSE 102 (H) 09/03/2021    Lab Results  Component Value Date/Time   GFR 59.39 (L) 04/27/2021 07:29 AM   GFR 58.22 (L) 04/24/2020 07:41 AM    Last diabetic Eye exam: No results found for: HMDIABEYEEXA  Last diabetic Foot exam: No results found for: HMDIABFOOTEX   Lab Results  Component Value Date   CHOL 196 04/27/2021   HDL 47.00 04/27/2021   LDLCALC 117 (H) 04/27/2021   TRIG 163.0 (H) 04/27/2021   CHOLHDL 4 04/27/2021    Hepatic Function Latest Ref Rng & Units 09/03/2021 06/25/2021 06/23/2021  Total Protein 6.5 - 8.1 g/dL 6.9 6.7 7.6  Albumin 3.5 - 5.0 g/dL 4.0 3.4(L) 4.0  AST 15 - 41 U/L _0 ALT 0 - 44 U/L _1 Alk Phosphatase 38 - 126 U/L 48 48 56  Total Bilirubin 0.3 - 1.2 mg/dL 1.0 0.7 1.3(H)  Bilirubin, Direct 0.0 - 0.3 mg/dL - - -    Lab Results  Component Value Date/Time   TSH 1.76 07/30/2015 08:07 AM    CBC Latest Ref Rng & Units 09/03/2021 06/25/2021 06/24/2021  WBC 4.0 - 10.5 K/uL 5.8 8.0 9.6  Hemoglobin 13.0 - 17.0 g/dL 13.0 12.0(L) 11.4(L)  Hematocrit 39.0 - 52.0 % 38.1(L) 36.0(L) 33.8(L)  Platelets 150 - 400 K/uL 206 220 198    No results found for: VD25OH  Clinical ASCVD: Yes  The 10-year ASCVD risk score (Arnett DK, et al., 2019) is: 18.7%   Values used to calculate the score:     Age: 70 years     Sex: Male     Is Non-Hispanic African American: No     Diabetic: No      Tobacco smoker: No     Systolic Blood Pressure: 747 mmHg     Is BP treated: No     HDL Cholesterol: 47 mg/dL     Total Cholesterol: 196 mg/dL    Depression screen [facility] 2/9 05/06/2021 04/24/2020 04/08/2020  Decreased Interest 0 0 0  Down, Depressed, Hopeless 0 0 0  PHQ - 2 Score 0 0 0  Altered sleeping - 0 -  Tired, decreased energy - 0 -  Change in appetite - 0 -  Feeling bad or failure about yourself  - 0 -  Trouble concentrating - 0 -  Moving slowly or fidgety/restless - 0 -  Suicidal thoughts - 0 -  PHQ-9 Score - 0 -  Difficult doing work/chores - Not difficult at all -     Social History   Tobacco Use  Smoking Status Never  Smokeless Tobacco Never   BP Readings from Last 3 Encounters:  11/19/21 124/74  10/27/21 (!) 153/76  09/03/21 (!) 147/74   Pulse Readings from Last 3 Encounters:  10/27/21 71  09/03/21 61  08/27/21 63   Wt Readings from Last 3 Encounters:  10/27/21 198 lb (89.8 kg)  09/03/21 200 lb 8 oz (90.9 kg)  08/27/21 199 lb (90.3 kg)   BMI Readings from Last 3 Encounters:  10/27/21 26.85 kg/m  09/03/21 27.19 kg/m  08/27/21 26.99 kg/m    Assessment/Interventions: Review of patient past medical history, allergies, medications, health status, including review of consultants reports, laboratory and other test data, was performed as part of comprehensive evaluation and provision of chronic care management services.   SDOH:  (Social Determinants of Health) assessments and interventions performed: Yes  SDOH Screenings   Alcohol Screen: Not on file  Depression (PHQ2-9): Low Risk    PHQ-2 Score: 0  Financial Resource Strain: Not on file  Food Insecurity: Not on file  Housing: Not on file  Physical Activity: Not on file  Social Connections: Not on file  Stress: Not on file  Tobacco Use: Low Risk    Smoking Tobacco Use: Never   Smokeless Tobacco Use: Never   Passive Exposure: Not on file  Transportation Needs: Not on file    CCM Care  Plan  Allergies  Allergen Reactions   Bactrim [Sulfamethoxazole-Trimethoprim] Hives    Red rash, hives   Hydrocodone    Percocet [Oxycodone-Acetaminophen] Other (See Comments)    TURNS RED FROM CHEST UP, LOOKS LIKE SUNBURN AND STARTS TO ITCH BADLY   Vancomycin    Penicillins Swelling and Rash    Did it involve swelling of the face/tongue/throat, SOB, or low BP? No Did it involve sudden or severe rash/hives, skin peeling, or any reaction on the inside of your mouth or nose? No Did you need to seek medical attention at a hospital or doctor's office? No When did it last happen?      50 years ago If all above answers are NO, may proceed with cephalosporin use.    Zithromax [Azithromycin] Rash    Medications Reviewed Today     Reviewed by Charlton Haws, [facility] (Pharmacist) on 11/19/21 at [phone]  Med List Status: <None>   Medication Order Taking? Sig Documenting Provider Last Dose Status Informant  abiraterone acetate (ZYTIGA) 250 MG tablet [id number] Yes Take 4 tablets (1,000 mg total) by mouth daily. Take on an empty stomach 1 hour before or 2 hours after a meal Shadad, Mathis Dad, MD Taking Active   apixaban (ELIQUIS) 5 MG TABS tablet [id number] Yes Take 1 tablet (5 mg total) by mouth 2 (two) times daily. [name], MD Taking Active   atorvastatin (LIPITOR) 20 MG tablet [id number] Yes Take 1 tablet (20 mg total) by mouth daily. [name], MD Taking Active   Calcium-Magnesium-Vitamin D (CITRACAL SLOW RELEASE PO) [id number] Yes Take 2 tablets by mouth daily. [provider] Taking Active Multiple Informants  Leuprolide Acetate, 4 Month, (ELIGARD) 30 MG injection [id number] Yes Inject 30 mg into the skin every 4 (four) months. [provider] Taking Active   predniSONE (DELTASONE) 5 MG tablet [id number] Yes TAKE ONE TABLET BY MOUTH DAILY WITH BREAKFAST [name], MD Taking Active   venlafaxine [facility]) 25 MG tablet [id number] Yes Take 1 tablet (25 mg total)  by mouth 2 (two) times daily. [name], MD Taking Active   Med List Note Darl Pikes, RPH-CPP 12/13/19 1147): Zytiga filled [place]            Patient Active Problem List   Diagnosis Date Noted   COVID-19 virus infection 10/27/2021   Elevated blood pressure reading 10/27/2021   Strain of rhomboid muscle 08/27/2021  Loss of peripheral visual field, bilateral 07/16/2021   Vision loss of right eye 06/30/2021   Acute pulmonary embolism ([place]) 06/24/2021   Atherosclerosis of aorta ([place]) 05/06/2021   CAD (coronary artery disease) 05/06/2021   History of DVT of lower extremity 02/25/2020   Hydronephrosis 02/13/2020   Loss of taste 01/31/2020   Recurrent UTI 12/31/2019   Malignant neoplasm of prostate ([place]) 08/17/2019   HLD (hyperlipidemia) 08/02/2018   Health maintenance examination 08/05/2017   Anosmia 08/05/2017   Family history of hemochromatosis 08/03/2016   Medicare annual wellness visit, subsequent 07/29/2015   Advanced care planning/counseling discussion 07/29/2015   Bradycardia 07/29/2015   History of cardiac monitoring 07/29/2015   Syncopal episodes     Immunization History  Administered Date(s) Administered   Influenza, High Dose Seasonal PF 10/02/2021   Influenza-Unspecified 10/09/2019   PFIZER(Purple Top)SARS-COV-2 Vaccination 12/28/2019, 01/17/2020, 09/25/2020, 04/20/2021   Pfizer Covid-19 Vaccine Bivalent Booster 14yrs & up 10/02/2021   Pneumococcal Conjugate-13 08/03/2016   Pneumococcal Polysaccharide-23 08/05/2017   Tdap 02/02/2017   Zoster Recombinat (Shingrix) 10/09/2019, 03/25/2020    Conditions to be addressed/monitored:  Hyperlipidemia and Coronary Artery Disease, Prostate cancer, Hot flashes, Hx PE  Care Plan : [place]  Updates made by Charlton Haws, [place] since 11/20/2021 12:00 AM     Problem: Hyperlipidemia and Coronary Artery Disease, Prostate cancer, Hot flashes, Hx PE   Priority: High      Long-Range Goal: Disease mgmt   Start Date: 11/20/2021  Expected End Date: 11/20/2022  This Visit's Progress: On track  Priority: High  Note:   Current Barriers:  Unable to independently monitor therapeutic efficacy Unable to maintain control of hot flashes  Pharmacist Clinical Goal(s):  Patient will achieve adherence to monitoring guidelines and medication adherence to achieve therapeutic efficacy achieve improvement in hot flashes as evidenced by pt report through collaboration with PharmD and provider.   Interventions: 1:1 collaboration with [name], MD regarding development and update of comprehensive plan of care as evidenced by provider attestation and co-signature Inter-disciplinary care team collaboration (see longitudinal plan of care) Comprehensive medication review performed; medication list updated in electronic medical record  Elevated BP readings (BP goal <140/90) -Controlled - BP readings have been high in recent office visits, no Dx of HTN; pt reports he checks BP occasionally at home and it is normal -Current treatment: None -Current home readings: 124/74 -Denies hypotensive/hypertensive symptoms -Educated on BP goals and benefits of medications for prevention of heart attack, stroke and kidney damage; Importance of home blood pressure monitoring; -Counseled to monitor BP at home periodically  Hyperlipidemia: (LDL goal < 70) -Not ideally controlled - LDL was above goal and has not been re-checked since starting statin; pt endorses compliance and denies issues; he wants to know how long he will need to be on statin -Aortic atherosclerosis on CT 06/2021 -Current treatment: Atorvastatin 20 mg daily -Educated on Cholesterol goals; Benefits of statin for ASCVD risk reduction; -Discussed CAD on CT and likely indefinite statin treatment -Recommended to continue current medication  Hot Flashes(Goal: manage symptoms) -Not ideally controlled - pt report hot  flashes due to prostate cancer treatments are still bothersome; he is currently taking 75 mg/day of IR venlafaxine - he feels that is is wearing off later in the day; he asked about CBD -Pt reports Dr [name] prescribes venlafaxine but pt is only seeing him PRN now, wondering if PCP can take over prescribing -Current treatment: Venlafaxine 25 mg - 3 tab daily -Medications previously  tried/failed: n/a -Discussed CBD - role in hot flash treatment is limited and cost is generally high; would recommend maximizing pharmacologic therapies before trying CBD -Educated on role/benefit of SNRI for hot flashes; he is currently taking 75 mg/day and doses up to 150 mg/day have been studied and showed benefit for hot flashes -Can consider gabapentin in future -Recommend changing venlafaxine to ER and increasing to 150 mg/day; will see if PCP is ok with taking over prescribing  Hx Prostate cancer (Goal: remission) -Controlled  -Hx castration-sensitive prostate cancer Dx 06/2019, 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 Currently in remission -Current treatment  Abiraterone (Zytiga) 250 mg - 4 tab daily empty stomach Leuprolide (Eligard) 30 mg injection q4 months Prednisone 5 mg daily -Recommended to continue current medication  Overactive bladder (Goal: manage symptoms) -Controlled - pt reports urinary symptoms have resolved; he has not needed Myrbetriq in months -Current treatment  Myrbetriq 50 mg daily- LF 07/03/21 - not taking -Removed Myrbetriq from med list. Continue monitoring  Recurrent VTE (Goal: prevent VTE) -Controlled - pt endorses compliance with  -Hx PE 2020 (in s/o cancer dx, surgery), acute PE 06/2021 (in s/o positive Covid-19). Per oncologist, continue Eliquis at least until next f/u and then discuss length of therapy -Current treatment  Eliquis 5 mg BID -Medications previously tried: Xarelto  -Counseled on length of therapy following PE; this is patient's second PE, although both were likely  provoked in s/o of cancer, surgery, COVID infection; discussed increased risk of VTE after stopping anticoagulation; advised pt to discuss with oncologist at January appt -Recommended to continue current medication  Health Maintenance -Vaccine gaps: None -Current therapy:  Calcium-Mg-Vitamin D -Patient is satisfied with current therapy and denies issues -Recommended to continue current medication  Patient Goals/Self-Care Activities Patient will:  - take medications as prescribed as evidenced by patient report and record review focus on medication adherence by routine check blood pressure periodically, document, and provide at future appointments       Medication Assistance:  Zytiga - [employer] grants through oncology  Compliance/Adherence/Medication fill history: Care Gaps: None  Star-Rating Drugs: Atorvastatin 20 mg - LF 09/23/21 x 90 ds; [place] 100%  Patient's preferred pharmacy is:  [facility] [id number], [place] [address] [place] [address] Phone: [phone] Fax: [phone]  [place] [place] [address] Phone: [phone] Fax: [phone]  Uses pill box? No -   Pt endorses 100% compliance  We discussed: Current pharmacy is preferred with insurance plan and patient is satisfied with pharmacy services Patient decided to: Continue current medication management strategy  Care Plan and Follow Up Patient Decision:  Patient agrees to Care Plan and Follow-up.  Plan: Telephone follow up appointment with care management team member scheduled for:  6 months  [name], PharmD, BCACP Clinical Pharmacist [facility] [phone]

## 2021-11-20 ENCOUNTER — Other Ambulatory Visit: Payer: Self-pay | Admitting: *Deleted

## 2021-11-20 DIAGNOSIS — C61 Malignant neoplasm of prostate: Secondary | ICD-10-CM

## 2021-11-20 MED ORDER — ABIRATERONE ACETATE 250 MG PO TABS
1000.0000 mg | ORAL_TABLET | Freq: Every day | ORAL | 10 refills | Status: DC
  Filled 2021-11-20 – 2021-12-08 (×2): qty 120, 30d supply, fill #0
  Filled 2022-02-02 (×2): qty 120, 30d supply, fill #1
  Filled 2022-02-24: qty 120, 30d supply, fill #2
  Filled 2022-03-29: qty 120, 30d supply, fill #3
  Filled 2022-04-27: qty 120, 30d supply, fill #4
  Filled 2022-05-28: qty 120, 30d supply, fill #5
  Filled 2022-06-28: qty 120, 30d supply, fill #6
  Filled 2022-07-26: qty 120, 30d supply, fill #7
  Filled 2022-08-20: qty 120, 30d supply, fill #8
  Filled 2022-09-23: qty 120, 30d supply, fill #9
  Filled 2022-10-25: qty 120, 30d supply, fill #10

## 2021-11-20 NOTE — Patient Instructions (Signed)
Visit Information  Phone number for Pharmacist: [phone]  Thank you for meeting with me to discuss your medications! I look forward to working with you to achieve your health care goals. Below is a summary of what we talked about during the visit:   Goals Addressed             This Visit's Progress    Manage My Medicine       Timeframe:  Long-Range Goal Priority:  High Start Date:      11/19/21                  Expected End Date:    11/19/22               Follow Up Date June 2023   - call for medicine refill 2 or 3 days before it runs out - call if I am sick and can't take my medicine - keep a list of all the medicines I take; vitamins and herbals too - use a pillbox to sort medicine    Why is this important?   These steps will help you keep on track with your medicines.   Notes:         Care Plan : CCM Pharmacy Care Plan  Updates made by Charlton Haws, RPH since 11/20/2021 12:00 AM     Problem: Hyperlipidemia and Coronary Artery Disease, Prostate cancer, Hot flashes, Hx PE   Priority: High     Long-Range Goal: Disease mgmt   Start Date: 11/20/2021  Expected End Date: 11/20/2022  This Visit's Progress: On track  Priority: High  Note:   Current Barriers:  Unable to independently monitor therapeutic efficacy Unable to maintain control of hot flashes  Pharmacist Clinical Goal(s):  Patient will achieve adherence to monitoring guidelines and medication adherence to achieve therapeutic efficacy achieve improvement in hot flashes as evidenced by pt report through collaboration with PharmD and provider.   Interventions: 1:1 collaboration with [name], MD regarding development and update of comprehensive plan of care as evidenced by provider attestation and co-signature Inter-disciplinary care team collaboration (see longitudinal plan of care) Comprehensive medication review performed; medication list updated in electronic medical  record  Elevated BP readings (BP goal <140/90) -Controlled - BP readings have been high in recent office visits, no Dx of HTN; pt reports he checks BP occasionally at home and it is normal -Current treatment: None -Current home readings: 124/74 -Denies hypotensive/hypertensive symptoms -Educated on BP goals and benefits of medications for prevention of heart attack, stroke and kidney damage; Importance of home blood pressure monitoring; -Counseled to monitor BP at home periodically  Hyperlipidemia: (LDL goal < 70) -Not ideally controlled - LDL was above goal and has not been re-checked since starting statin; pt endorses compliance and denies issues; he wants to know how long he will need to be on statin -Aortic atherosclerosis on CT 06/2021 -Current treatment: Atorvastatin 20 mg daily -Educated on Cholesterol goals; Benefits of statin for ASCVD risk reduction; -Discussed CAD on CT and likely indefinite statin treatment -Recommended to continue current medication  Hot Flashes(Goal: manage symptoms) -Not ideally controlled - pt report hot flashes due to prostate cancer treatments are still bothersome; he is currently taking 75 mg/day of IR venlafaxine - he feels that is is wearing off later in the day; he asked about CBD -Pt reports Dr [name] prescribes venlafaxine but pt is only seeing him PRN now, wondering if PCP can take over prescribing -Current treatment: Venlafaxine 25  mg - 3 tab daily -Medications previously tried/failed: n/a -Discussed CBD - role in hot flash treatment is limited and cost is generally high; would recommend maximizing pharmacologic therapies before trying CBD -Educated on role/benefit of SNRI for hot flashes; he is currently taking 75 mg/day and doses up to 150 mg/day have been studied and showed benefit for hot flashes -Can consider gabapentin in future -Recommend changing venlafaxine to ER and increasing to 150 mg/day; will see if PCP is ok with taking over  prescribing  Hx Prostate cancer (Goal: remission) -Controlled  -Hx castration-sensitive prostate cancer Dx 06/2019, 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 Currently in remission -Current treatment  Abiraterone (Zytiga) 250 mg - 4 tab daily empty stomach Leuprolide (Eligard) 30 mg injection q4 months Prednisone 5 mg daily -Recommended to continue current medication  Overactive bladder (Goal: manage symptoms) -Controlled - pt reports urinary symptoms have resolved; he has not needed Myrbetriq in months -Current treatment  Myrbetriq 50 mg daily- LF 07/03/21 - not taking -Removed Myrbetriq from med list. Continue monitoring  Recurrent VTE (Goal: prevent VTE) -Controlled - pt endorses compliance with  -Hx PE 2020 (in s/o cancer dx, surgery), acute PE 06/2021 (in s/o positive Covid-19). Per oncologist, continue Eliquis at least until next f/u and then discuss length of therapy -Current treatment  Eliquis 5 mg BID -Medications previously tried: Xarelto  -Counseled on length of therapy following PE; this is patient's second PE, although both were likely provoked in s/o of cancer, surgery, COVID infection; discussed increased risk of VTE after stopping anticoagulation; advised pt to discuss with oncologist at January appt -Recommended to continue current medication  Health Maintenance -Vaccine gaps: None -Current therapy:  Calcium-Mg-Vitamin D -Patient is satisfied with current therapy and denies issues -Recommended to continue current medication  Patient Goals/Self-Care Activities Patient will:  - take medications as prescribed as evidenced by patient report and record review focus on medication adherence by routine check blood pressure periodically, document, and provide at future appointments       Shane [name] was given information about Chronic Care Management services today including:  CCM service includes personalized support from designated clinical staff supervised by his  physician, including individualized plan of care and coordination with other care providers 24/7 contact phone numbers for assistance for urgent and routine care needs. Standard insurance, coinsurance, copays and deductibles apply for chronic care management only during months in which we provide at least 20 minutes of these services. Most insurances cover these services at 100%, however patients may be responsible for any copay, coinsurance and/or deductible if applicable. This service may help you avoid the need for more expensive face-to-face services. Only one practitioner may furnish and bill the service in a calendar month. The patient may stop CCM services at any time (effective at the end of the month) by phone call to the office staff.  Patient agreed to services and verbal consent obtained.   Patient verbalizes understanding of instructions provided today and agrees to view in [place].  Telephone follow up appointment with pharmacy team member scheduled for: 6 months  [name], PharmD, [facility] Clinical Pharmacist [facility] [phone]

## 2021-11-21 ENCOUNTER — Telehealth: Payer: Self-pay | Admitting: Family Medicine

## 2021-11-21 MED ORDER — VENLAFAXINE HCL ER 150 MG PO CP24: 150.0000 mg | ORAL_CAPSULE | Freq: Every day | ORAL | 6 refills | Status: DC

## 2021-11-21 NOTE — Telephone Encounter (Signed)
Will send in venlafaxine 150mg  ER to [facility] pharmacy.  Plz have pt contact us if he decides to come off medication given difficulty in tapering off venlafaxine.

## 2021-11-21 NOTE — Telephone Encounter (Signed)
Per recent CCM note;  Summary: -Pt reports hot flashes due to prostate cancer treatment are still bothersome; he is currently taking immediate-release venlafaxine 75 mg/day and reports it is wearing off later in the day. Dr [name] (urology) previously prescribed this but pt is only seeing him PRN now   Recommendations/Changes made from today's visit: -Recommend change venlafaxine to ER and increase to 150 mg/day - consulting with PCP for Rx

## 2021-11-23 ENCOUNTER — Other Ambulatory Visit (HOSPITAL_COMMUNITY): Payer: Self-pay

## 2021-11-23 ENCOUNTER — Telehealth: Payer: Self-pay

## 2021-11-23 NOTE — Telephone Encounter (Signed)
Oral Chemotherapy Pharmacist Encounter  Informed patient that we will be filling his Zytiga at [facility] and that we have grants to cover patients out of pocket costs. Patient states he has 12 days of medication left plus one bottle of the medication. Patient notified that he will receive a call on January 3rd to set up delivery so that he second grant does not expire.   Drema Halon, PharmD Hematology/Oncology Clinical Pharmacist [facility] [phone]

## 2021-12-04 ENCOUNTER — Other Ambulatory Visit (HOSPITAL_COMMUNITY): Payer: Self-pay

## 2021-12-05 DIAGNOSIS — E7800 Pure hypercholesterolemia, unspecified: Secondary | ICD-10-CM

## 2021-12-05 DIAGNOSIS — C61 Malignant neoplasm of prostate: Secondary | ICD-10-CM | POA: Diagnosis not present

## 2021-12-05 DIAGNOSIS — I2510 Atherosclerotic heart disease of native coronary artery without angina pectoris: Secondary | ICD-10-CM

## 2021-12-08 ENCOUNTER — Other Ambulatory Visit (HOSPITAL_COMMUNITY): Payer: Self-pay

## 2021-12-08 ENCOUNTER — Encounter: Payer: Self-pay | Admitting: Oncology

## 2021-12-15 ENCOUNTER — Telehealth: Payer: Self-pay

## 2021-12-15 NOTE — Chronic Care Management (AMB) (Signed)
° ° °  Chronic Care Management Pharmacy Assistant   Name: Shane [name]  MRN: [id number] DOB: [birth year]   Reason for Encounter:General  Disease State    Recent office visits:  None since last CCM contact  Recent consult visits:  None since last CCM contact  Hospital visits:  None in previous 6 months  Medications: Outpatient Encounter Medications as of 12/15/2021  Medication Sig   abiraterone acetate (ZYTIGA) 250 MG tablet Take 4 tablets (1,000 mg total) by mouth daily. Take on an empty stomach 1 hour before or 2 hours after a meal   apixaban (ELIQUIS) 5 MG TABS tablet Take 1 tablet (5 mg total) by mouth 2 (two) times daily.   atorvastatin (LIPITOR) 20 MG tablet Take 1 tablet (20 mg total) by mouth daily.   Calcium-Magnesium-Vitamin D (CITRACAL SLOW RELEASE PO) Take 2 tablets by mouth daily.   Leuprolide Acetate, 4 Month, (ELIGARD) 30 MG injection Inject 30 mg into the skin every 4 (four) months.   predniSONE (DELTASONE) 5 MG tablet TAKE ONE TABLET BY MOUTH DAILY WITH BREAKFAST   venlafaxine XR (EFFEXOR-XR) 150 MG 24 hr capsule Take 1 capsule (150 mg total) by mouth daily with breakfast.   No facility-administered encounter medications on file as of 12/15/2021.     Contacted [name] on 12/16/21 for general disease state and medication adherence call.   Patient is not more than 5 days past due for refill on the following medications per chart history:  [facility] Medications: Medication Name/mg Last Fill Days Supply Atorvastatin 20mg   09/23/21 90    What concerns do you have about your medications? Eliquis is expensive, he will speak to Dr.G about this .The hormone therapy causes him to stay tired.  The patient denies side effects with their medications.   How often do you forget or accidentally miss a dose? Never  Do you use a pillbox? Yes  Are you having any problems getting your medications from your pharmacy? No  Has the cost of your medications been a concern?  Yes Eliquis   Since last visit with CPP, the following interventions have been made. Increased venlafaxine for hot flashes  The patient has not had an ED visit since last contact.   The patient reports the following problems with their health.  Changed venlafaxine dose and the hot flashes have been less frequent but still intense. The patient reports he stays tired from the hormone therapy.  Patient denies concerns or questions for [name], PharmD at this time.   Counseled patient on:  [name] job taking medications, Importance of taking medication daily without missed doses, Benefits of adherence packaging or a pillbox, and Access to CCM team for any cost, medication or pharmacy concerns.  The patient will contact PCP or CPP for details on weaning off venlafaxine when he is ready .   Care Gaps: Annual wellness visit in last year? Yes Most Recent BP reading:147/74  61-P 09/03/21   Upcoming appointments: Oncology 12/31/21   [name], CPP notified  [name], [place] Assistant [phone]  Total time spent for month CPA: 30 min

## 2021-12-31 ENCOUNTER — Inpatient Hospital Stay: Payer: PPO | Attending: Oncology

## 2021-12-31 ENCOUNTER — Inpatient Hospital Stay: Payer: PPO | Admitting: Oncology

## 2021-12-31 ENCOUNTER — Other Ambulatory Visit: Payer: Self-pay

## 2021-12-31 ENCOUNTER — Inpatient Hospital Stay: Payer: PPO

## 2021-12-31 VITALS — BP 156/72 | HR 64 | Temp 97.5°F | Resp 20 | Wt 204.3 lb

## 2021-12-31 DIAGNOSIS — C61 Malignant neoplasm of prostate: Secondary | ICD-10-CM | POA: Insufficient documentation

## 2021-12-31 LAB — CMP (CANCER CENTER ONLY)
ALT: 16 U/L (ref 0–44)
AST: 20 U/L (ref 15–41)
Albumin: 4.5 g/dL (ref 3.5–5.0)
Alkaline Phosphatase: 50 U/L (ref 38–126)
Anion gap: 7 (ref 5–15)
BUN: 17 mg/dL (ref 8–23)
CO2: 28 mmol/L (ref 22–32)
Calcium: 9.9 mg/dL (ref 8.9–10.3)
Chloride: 105 mmol/L (ref 98–111)
Creatinine: 1.2 mg/dL (ref 0.61–1.24)
GFR, Estimated: >60 mL/min (ref >60)
Glucose, Bld: 83 mg/dL (ref 70–99)
Potassium: 3.6 mmol/L (ref 3.5–5.1)
Sodium: 140 mmol/L (ref 135–145)
Total Bilirubin: 0.8 mg/dL (ref 0.3–1.2)
Total Protein: 7.4 g/dL (ref 6.5–8.1)

## 2021-12-31 LAB — CBC WITH DIFFERENTIAL (CANCER CENTER ONLY)
Abs Immature Granulocytes: 0.06 10*3/uL (ref 0.00–0.07)
Basophils Absolute: 0 10*3/uL (ref 0.0–0.1)
Basophils Relative: 1 %
Eosinophils Absolute: 0.4 10*3/uL (ref 0.0–0.5)
Eosinophils Relative: 5 %
HCT: 40.3 % (ref 39.0–52.0)
Hemoglobin: 13.6 g/dL (ref 13.0–17.0)
Immature Granulocytes: 1 %
Lymphocytes Relative: 15 %
Lymphs Abs: 1 10*3/uL (ref 0.7–4.0)
MCH: 32.2 pg (ref 26.0–34.0)
MCHC: 33.7 g/dL (ref 30.0–36.0)
MCV: 95.3 fL (ref 80.0–100.0)
Monocytes Absolute: 0.6 10*3/uL (ref 0.1–1.0)
Monocytes Relative: 9 %
Neutro Abs: 4.6 10*3/uL (ref 1.7–7.7)
Neutrophils Relative %: 69 %
Platelet Count: 210 10*3/uL (ref 150–400)
RBC: 4.23 MIL/uL (ref 4.22–5.81)
RDW: 12.1 % (ref 11.5–15.5)
WBC Count: 6.6 10*3/uL (ref 4.0–10.5)
nRBC: 0 % (ref 0.0–0.2)

## 2021-12-31 MED ORDER — LEUPROLIDE ACETATE (4 MONTH) 30 MG ~~LOC~~ KIT
30.0000 mg | PACK | Freq: Once | SUBCUTANEOUS | Status: AC
  Administered 2021-12-31: 30 mg via SUBCUTANEOUS
  Filled 2021-12-31: qty 30

## 2021-12-31 NOTE — Progress Notes (Signed)
Hematology and Oncology Follow Up Visit  Shane [name] [id number] 17-Oct-1951 71 y.o. 12/31/2021 9:46 AM [name], MDGutierrez, [name], MD   Principle Diagnosis: 71 year old man with advanced prostate cancer diagnosed in 2020.  He developed castration-sensitive after presenting with Gleason score of 5+4 = 9.   Prior Therapy:  He is status post TURBT and stent placement bilaterally the pathology showed Gleason score 5+4 = 9 prostate cancer invading into the bladder.  Current therapy:   Eligard 30 mg every 4 months.   Next Eligard will be given on December 31, 2021.  Zytiga 1000 mg daily with prednisone 5 mg started in September 2020.  Interim History: Mr. Genova returns today for a follow-up visit.  Since the last visit, he reports no major changes in his health.  He continues to tolerate Zytiga without any major complaints.  He denies any nausea, vomiting or worsening edema.  He denies any excessive fatigue or tiredness.  He has reported some occasional memory issues but overall his quality of life remained excellent.       Medications: Updated on review. Current Outpatient Medications  Medication Sig Dispense Refill   abiraterone acetate (ZYTIGA) 250 MG tablet Take 4 tablets (1,000 mg total) by mouth daily. Take on an empty stomach 1 hour before or 2 hours after a meal 120 tablet 10   apixaban (ELIQUIS) 5 MG TABS tablet Take 1 tablet (5 mg total) by mouth 2 (two) times daily. 60 tablet 3   atorvastatin (LIPITOR) 20 MG tablet Take 1 tablet (20 mg total) by mouth daily. 90 tablet 3   Calcium-Magnesium-Vitamin D (CITRACAL SLOW RELEASE PO) Take 2 tablets by mouth daily.     Leuprolide Acetate, 4 Month, (ELIGARD) 30 MG injection Inject 30 mg into the skin every 4 (four) months.     predniSONE (DELTASONE) 5 MG tablet TAKE ONE TABLET BY MOUTH DAILY WITH BREAKFAST 90 tablet 0   venlafaxine XR (EFFEXOR-XR) 150 MG 24 hr capsule Take 1 capsule (150 mg total) by mouth daily with  breakfast. 30 capsule 6   No current facility-administered medications for this visit.     Allergies:  Allergies  Allergen Reactions   Bactrim [Sulfamethoxazole-Trimethoprim] Hives    Red rash, hives   Hydrocodone    Percocet [Oxycodone-Acetaminophen] Other (See Comments)    TURNS RED FROM CHEST UP, LOOKS LIKE SUNBURN AND STARTS TO ITCH BADLY   Vancomycin    Penicillins Swelling and Rash    Did it involve swelling of the face/tongue/throat, SOB, or low BP? No Did it involve sudden or severe rash/hives, skin peeling, or any reaction on the inside of your mouth or nose? No Did you need to seek medical attention at a hospital or doctor's office? No When did it last happen?      50 years ago If all above answers are NO, may proceed with cephalosporin use.    Zithromax [Azithromycin] Rash        Physical Exam:      Blood pressure (!) 156/72, pulse 64, temperature (!) 97.5 F (36.4 C), resp. rate 20, weight 204 lb 4.8 oz (92.7 kg), SpO2 98 %.     ECOG: 0     General appearance: Comfortable appearing without any discomfort Head: Normocephalic without any trauma Oropharynx: Mucous membranes are moist and pink without any thrush or ulcers. Eyes: Pupils are equal and round reactive to light. Lymph nodes: No cervical, supraclavicular, inguinal or axillary lymphadenopathy.   Heart:regular rate and rhythm.  S1 and S2  without leg edema. Lung: Clear without any rhonchi or wheezes.  No dullness to percussion. Abdomin: Soft, nontender, nondistended with good bowel sounds.  No hepatosplenomegaly. Musculoskeletal: No joint deformity or effusion.  Full range of motion noted. Neurological: No deficits noted on motor, sensory and deep tendon reflex exam. Skin: No petechial rash or dryness.  Appeared moist.             Lab Results: Lab Results  Component Value Date   WBC 5.8 09/03/2021   HGB 13.0 09/03/2021   HCT 38.1 (L) 09/03/2021   MCV 94.1 09/03/2021   PLT 206  09/03/2021     Chemistry      Component Value Date/Time   NA 142 09/03/2021 0942   NA 139 02/18/2021 1236   K 3.6 09/03/2021 0942   CL 110 09/03/2021 0942   CO2 22 09/03/2021 0942   BUN 22 09/03/2021 0942   BUN 22 02/18/2021 1236   CREATININE 1.30 (H) 09/03/2021 0942   GLU 113 01/08/2020 0000      Component Value Date/Time   CALCIUM 9.6 09/03/2021 0942   ALKPHOS 48 09/03/2021 0942   AST 24 09/03/2021 0942   ALT 21 09/03/2021 0942   BILITOT 1.0 09/03/2021 0942         Impression and Plan:   71 year old with:   1.    Advanced prostate cancer with lymphadenopathy diagnosed in July 2020.  He has castration-sensitive disease.   Risks and benefits of continuing Zytiga versus alternative treatment choices were reviewed.  Complications including hypertension, edema excessive fatigue were reviewed.  Alternative treatment options such as Taxotere chemotherapy among others were reiterated.  He is agreeable to continue at this time.  2.    Bone directed therapy: He is to continue with calcium and vitamin D supplements.   3.  Pulmonary embolism: He has completed 6 months of Eliquis.  Risks and benefits of discontinuation of anticoagulation were reviewed.  He understands if he develop unprovoked thrombosis in the future he will require lifetime anticoagulation.  Have recommended discontinuation of therapy at this time.   4.  Androgen deprivation: He is currently on Eligard every 4 months and will be repeated today.  Complications including weight gain, hot flashes and sexual dysfunction were reiterated.  5.  Hypertension: We will continue to monitor on Zytiga.  Blood pressure is mildly elevated.   6.  Follow-up: In 4 months for repeat follow-up.   30 minutes were dedicated to this visit.  The time was spent on reviewing laboratory data, disease status update, addressing complication related to cancer and cancer therapy.  Zola Button, MD 1/26/20239:46 AM

## 2022-01-01 ENCOUNTER — Telehealth: Payer: Self-pay | Admitting: *Deleted

## 2022-01-01 LAB — PROSTATE-SPECIFIC AG, SERUM (LABCORP): Prostate Specific Ag, Serum: <0.1 ng/mL (ref 0.0–4.0)

## 2022-01-01 NOTE — Telephone Encounter (Signed)
Notified of PSA

## 2022-01-01 NOTE — Telephone Encounter (Signed)
-----   Message from [name], MD sent at 01/01/2022  8:33 AM EST ----- Please let him know his PSA is down

## 2022-01-02 ENCOUNTER — Encounter: Payer: Self-pay | Admitting: Family Medicine

## 2022-01-12 ENCOUNTER — Other Ambulatory Visit (HOSPITAL_COMMUNITY): Payer: Self-pay

## 2022-01-18 ENCOUNTER — Other Ambulatory Visit: Payer: Self-pay | Admitting: Family Medicine

## 2022-01-19 NOTE — Telephone Encounter (Signed)
Plz address in Dr. Synthia Innocent absence.   Eliquis Last filled:  12/21/21, #60 Last OV:  06/30/21, hosp f/u Next OV:  05/10/22, AWV prt 2

## 2022-01-21 NOTE — Telephone Encounter (Signed)
Sent. Thanks.   

## 2022-02-02 ENCOUNTER — Other Ambulatory Visit (HOSPITAL_COMMUNITY): Payer: Self-pay

## 2022-02-02 ENCOUNTER — Other Ambulatory Visit: Payer: Self-pay | Admitting: Oncology

## 2022-02-04 ENCOUNTER — Other Ambulatory Visit (HOSPITAL_COMMUNITY): Payer: Self-pay

## 2022-02-04 ENCOUNTER — Other Ambulatory Visit: Payer: Self-pay | Admitting: Oncology

## 2022-02-08 ENCOUNTER — Other Ambulatory Visit (HOSPITAL_COMMUNITY): Payer: Self-pay

## 2022-02-11 ENCOUNTER — Emergency Department (HOSPITAL_COMMUNITY): Payer: PPO

## 2022-02-11 ENCOUNTER — Telehealth: Payer: Self-pay | Admitting: Family Medicine

## 2022-02-11 ENCOUNTER — Observation Stay (HOSPITAL_COMMUNITY)
Admission: EM | Admit: 2022-02-11 | Discharge: 2022-02-12 | Disposition: A | Discharge status: Home / Self Care | Payer: PPO | Attending: Family Medicine | Admitting: Family Medicine

## 2022-02-11 ENCOUNTER — Other Ambulatory Visit: Payer: Self-pay

## 2022-02-11 ENCOUNTER — Telehealth: Payer: Self-pay

## 2022-02-11 ENCOUNTER — Encounter (HOSPITAL_COMMUNITY): Payer: Self-pay

## 2022-02-11 DIAGNOSIS — Z79899 Other long term (current) drug therapy: Secondary | ICD-10-CM | POA: Insufficient documentation

## 2022-02-11 DIAGNOSIS — R519 Headache, unspecified: Secondary | ICD-10-CM | POA: Diagnosis not present

## 2022-02-11 DIAGNOSIS — I219 Acute myocardial infarction, unspecified: Secondary | ICD-10-CM | POA: Diagnosis not present

## 2022-02-11 DIAGNOSIS — I10 Essential (primary) hypertension: Secondary | ICD-10-CM | POA: Diagnosis present

## 2022-02-11 DIAGNOSIS — I2699 Other pulmonary embolism without acute cor pulmonale: Secondary | ICD-10-CM | POA: Diagnosis not present

## 2022-02-11 DIAGNOSIS — C61 Malignant neoplasm of prostate: Secondary | ICD-10-CM | POA: Diagnosis not present

## 2022-02-11 DIAGNOSIS — Z7901 Long term (current) use of anticoagulants: Secondary | ICD-10-CM | POA: Diagnosis not present

## 2022-02-11 DIAGNOSIS — E7800 Pure hypercholesterolemia, unspecified: Secondary | ICD-10-CM | POA: Diagnosis not present

## 2022-02-11 DIAGNOSIS — E785 Hyperlipidemia, unspecified: Secondary | ICD-10-CM | POA: Diagnosis present

## 2022-02-11 DIAGNOSIS — I2510 Atherosclerotic heart disease of native coronary artery without angina pectoris: Secondary | ICD-10-CM | POA: Diagnosis not present

## 2022-02-11 DIAGNOSIS — Z86711 Personal history of pulmonary embolism: Secondary | ICD-10-CM | POA: Insufficient documentation

## 2022-02-11 DIAGNOSIS — R0789 Other chest pain: Secondary | ICD-10-CM | POA: Diagnosis not present

## 2022-02-11 DIAGNOSIS — R030 Elevated blood-pressure reading, without diagnosis of hypertension: Secondary | ICD-10-CM | POA: Diagnosis not present

## 2022-02-11 DIAGNOSIS — Z20822 Contact with and (suspected) exposure to covid-19: Secondary | ICD-10-CM | POA: Insufficient documentation

## 2022-02-11 DIAGNOSIS — J984 Other disorders of lung: Secondary | ICD-10-CM | POA: Diagnosis not present

## 2022-02-11 LAB — CBC WITH DIFFERENTIAL/PLATELET
Abs Immature Granulocytes: 0.06 10*3/uL (ref 0.00–0.07)
Basophils Absolute: 0.1 10*3/uL (ref 0.0–0.1)
Basophils Relative: 1 %
Eosinophils Absolute: 0.2 10*3/uL (ref 0.0–0.5)
Eosinophils Relative: 3 %
HCT: 43.1 % (ref 39.0–52.0)
Hemoglobin: 14.2 g/dL (ref 13.0–17.0)
Immature Granulocytes: 1 %
Lymphocytes Relative: 17 %
Lymphs Abs: 1.5 10*3/uL (ref 0.7–4.0)
MCH: 32.4 pg (ref 26.0–34.0)
MCHC: 32.9 g/dL (ref 30.0–36.0)
MCV: 98.4 fL (ref 80.0–100.0)
Monocytes Absolute: 0.6 10*3/uL (ref 0.1–1.0)
Monocytes Relative: 7 %
Neutro Abs: 6.4 10*3/uL (ref 1.7–7.7)
Neutrophils Relative %: 71 %
Platelets: 238 10*3/uL (ref 150–400)
RBC: 4.38 MIL/uL (ref 4.22–5.81)
RDW: 12 % (ref 11.5–15.5)
WBC: 8.8 10*3/uL (ref 4.0–10.5)
nRBC: 0 % (ref 0.0–0.2)

## 2022-02-11 LAB — BASIC METABOLIC PANEL
Anion gap: 9 (ref 5–15)
BUN: 17 mg/dL (ref 8–23)
CO2: 26 mmol/L (ref 22–32)
Calcium: 10.4 mg/dL — ABNORMAL HIGH (ref 8.9–10.3)
Chloride: 102 mmol/L (ref 98–111)
Creatinine, Ser: 1.15 mg/dL (ref 0.61–1.24)
GFR, Estimated: >60 mL/min (ref >60)
Glucose, Bld: 94 mg/dL (ref 70–99)
Potassium: 4.3 mmol/L (ref 3.5–5.1)
Sodium: 137 mmol/L (ref 135–145)

## 2022-02-11 LAB — TROPONIN I (HIGH SENSITIVITY)
Troponin I (High Sensitivity): 12 ng/L (ref <18)
Troponin I (High Sensitivity): 14 ng/L (ref <18)

## 2022-02-11 IMAGING — CT CT HEAD W/O CM
4 series · 17 of 47 positions shown, 19 images · non-contrast
Comparison: CT brain [DATE]

CLINICAL DATA: Headache



[Series 3: head without · axial · non-contrast · 0.44mm/px · z∈[-151,-21]mm · 7 of 36 slices shown, 9 images]
[im 5/36  brain]
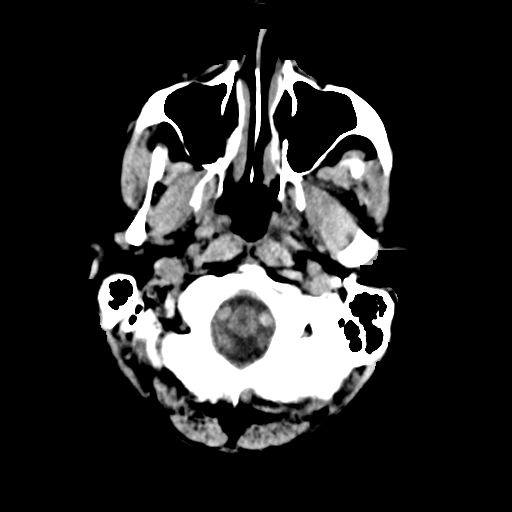
[im 5/36  bone]
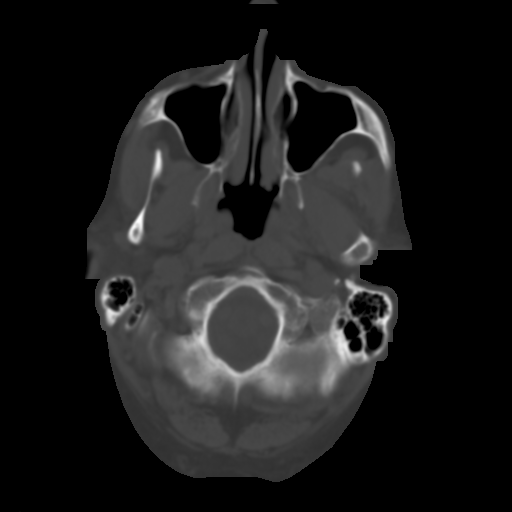
[im 9/36  brain]
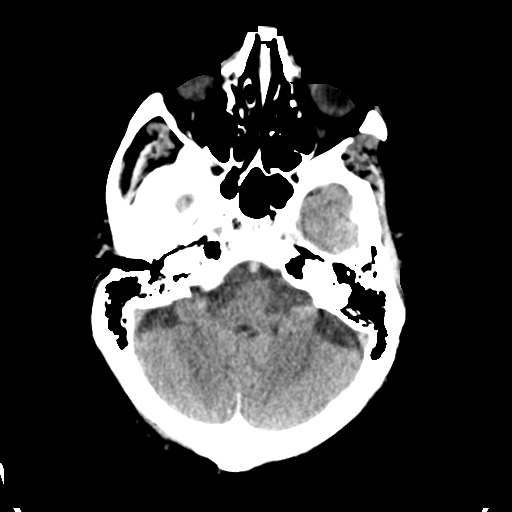
[im 14/36  brain]
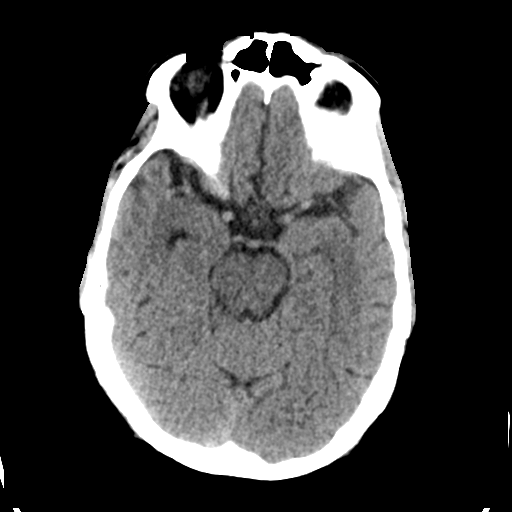
[im 18/36  brain]
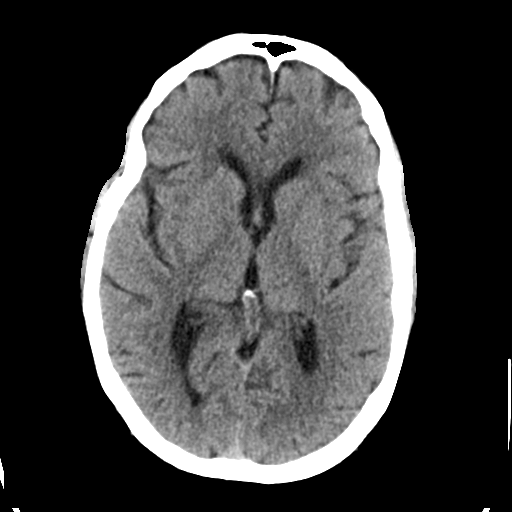
[im 22/36  brain]
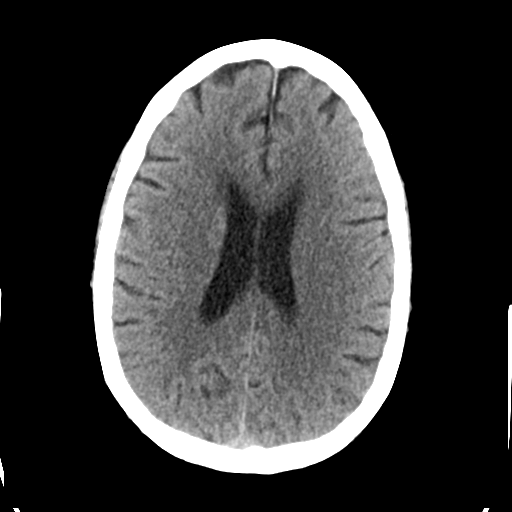
[im 22/36  bone]
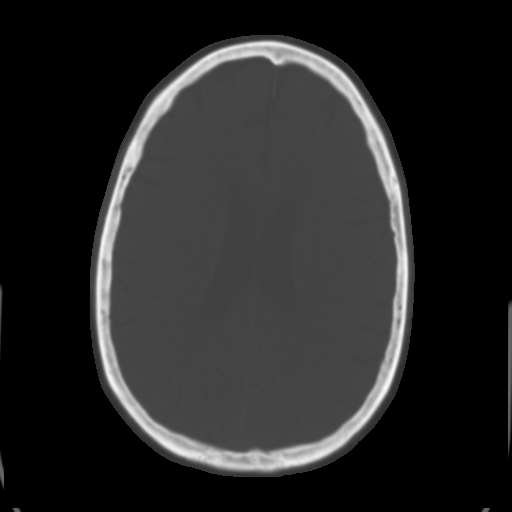
[im 27/36  brain]
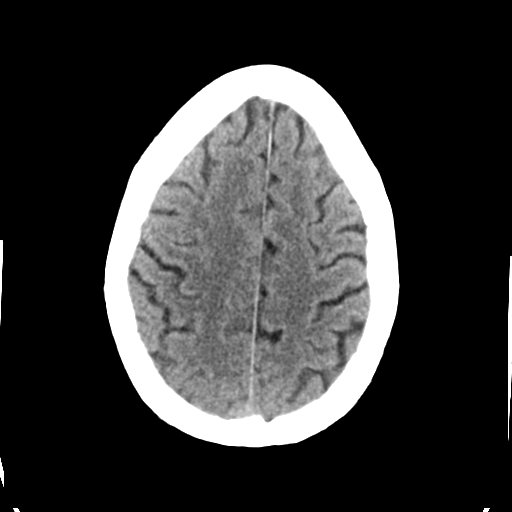
[im 31/36  brain]
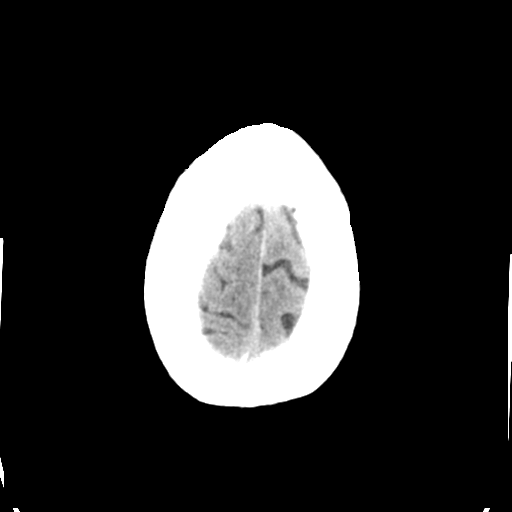

[Series 4: head bone · axial · 0.44mm/px · z∈[-155,-91]mm · 4 of 90 slices shown]
[im 9/90  bone]
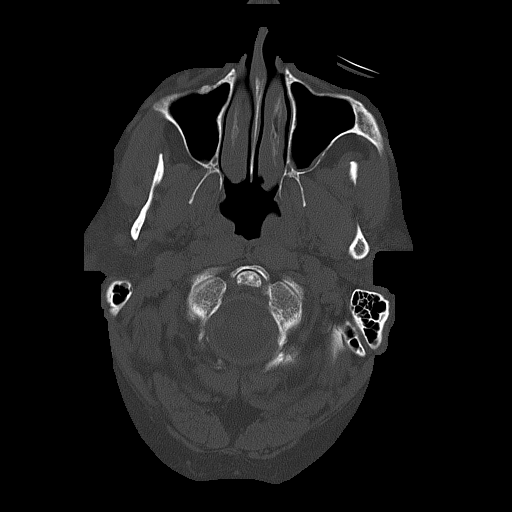
[im 18/90  bone]
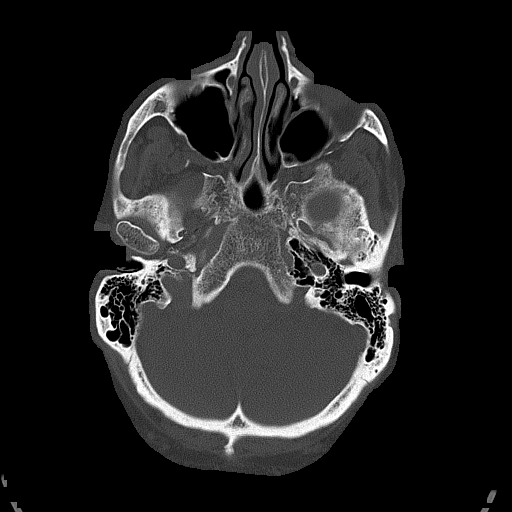
[im 27/90  bone]
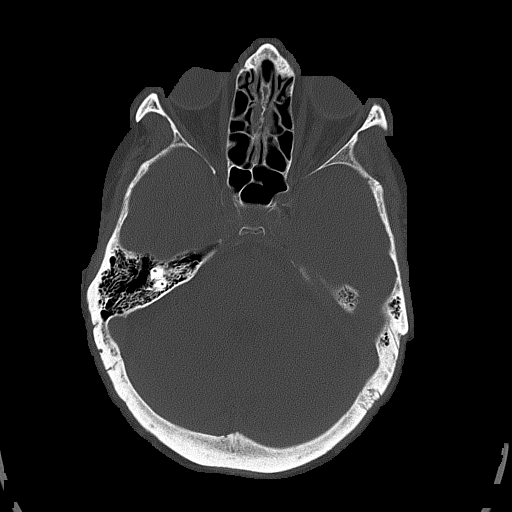
[im 41/90  bone]
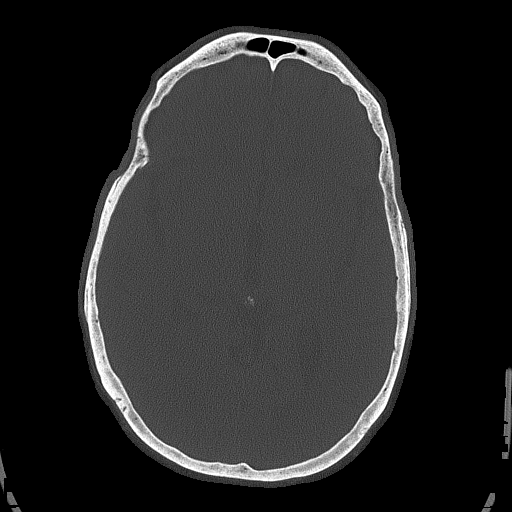

[Series 5: head without cor · coronal · non-contrast · 0.35mm/px · 3 of 74 slices shown]
[im 25/74  brain]
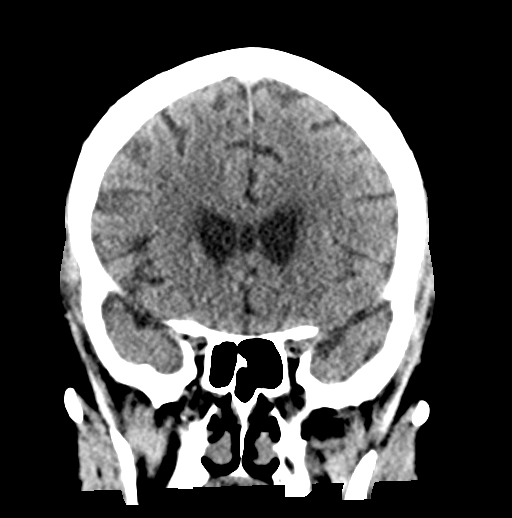
[im 33/74  brain]
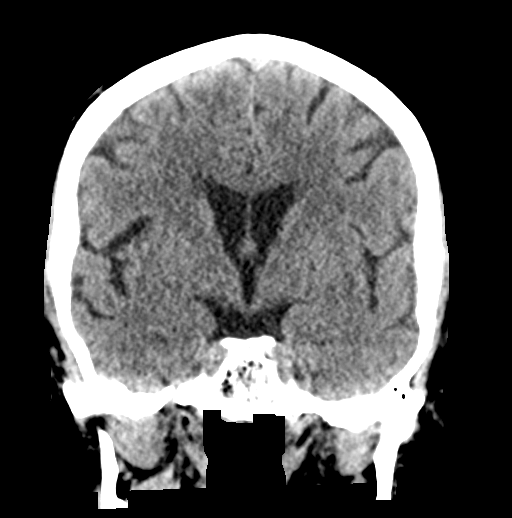
[im 41/74  brain]
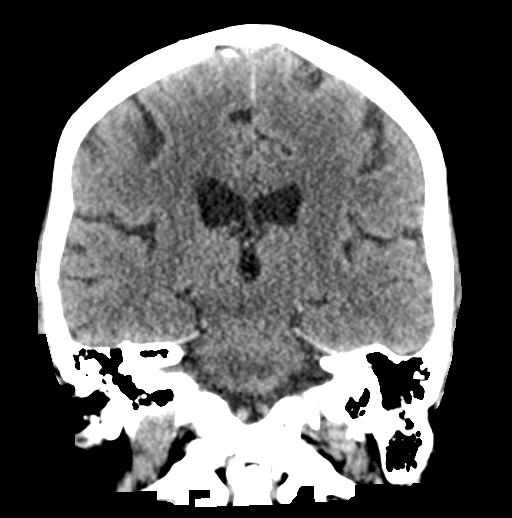

[Series 6: head without sag · sagittal · non-contrast · 0.35mm/px · 3 of 58 slices shown]
[im 20/58  brain]
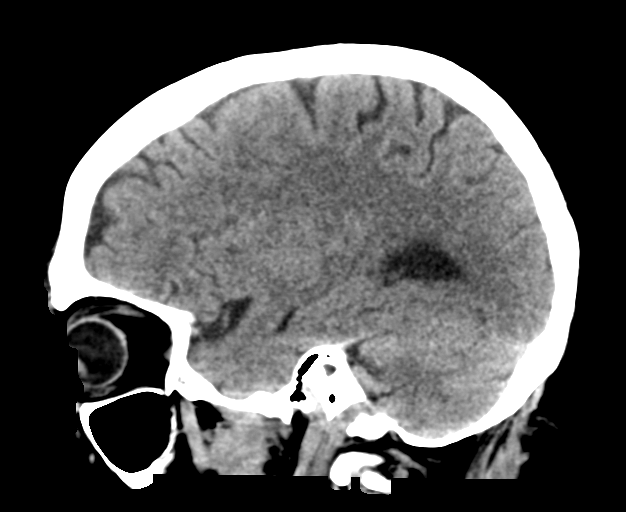
[im 29/58  brain]
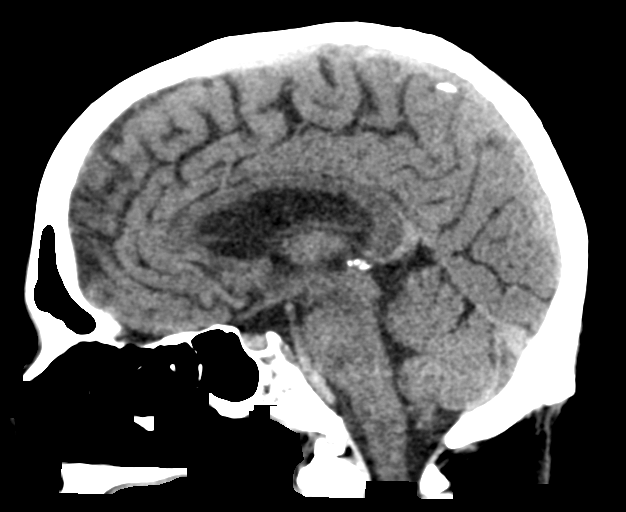
[im 39/58  brain]
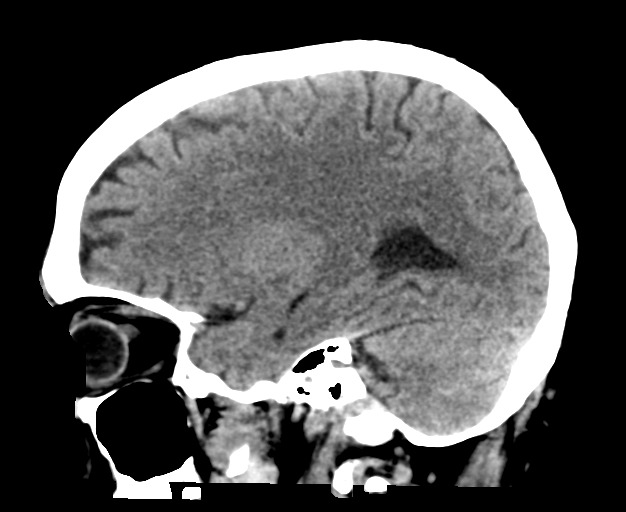

[17 of 47 positions shown; findings below may reference images not displayed]

FINDINGS: Brain: No acute territorial infarction, hemorrhage or intracranial
mass. Minimal white matter hypodensity consistent with chronic small
vessel ischemic change. The ventricles are nonenlarged

Vascular: No hyperdense vessels.  Carotid vascular calcification

Skull: Normal. Negative for fracture or focal lesion.

Sinuses/Orbits: Mild mucosal thickening in the sinuses

Other: None
IMPRESSION: 1. No CT evidence for acute intracranial abnormality.
2. Mild chronic small vessel ischemic changes of the white matter

## 2022-02-11 IMAGING — CT CT ANGIO CHEST
2 of 6 series · 18 of 36 positions shown · IV contrast (agent unspecified)
Comparison: Chest radiograph [DATE], chest CTA [DATE], no
interval chest imaging available.

CLINICAL DATA: Pulmonary embolism (PE) suspected, high prob

Right-sided chest and abdominal pain.
EXAM:
CT ANGIOGRAPHY CHEST WITH CONTRAST
TECHNIQUE: Multidetector CT imaging of the chest was performed using the
standard protocol during bolus administration of intravenous
contrast. Multiplanar CT image reconstructions and MIPs were
obtained to evaluate the vascular anatomy.

[Series 7: pe thins · axial · 0.78mm/px · z∈[-523,-285]mm · 17 of 378 slices shown]
[im 19/378  lung]
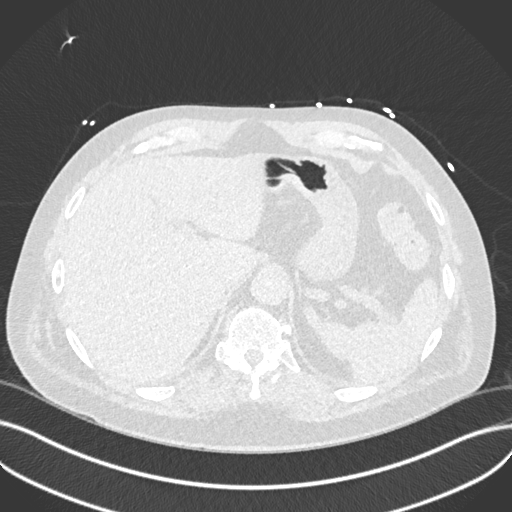
[im 38/378  mediastinal]
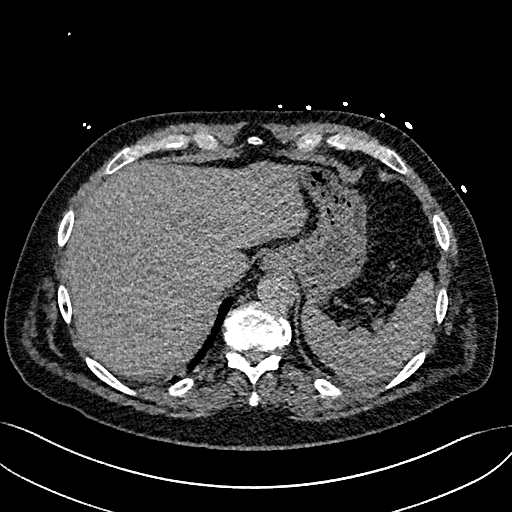
[im 57/378  lung]
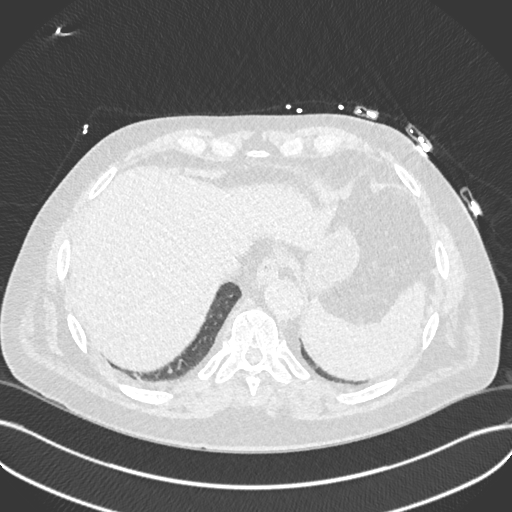
[im 76/378  mediastinal]
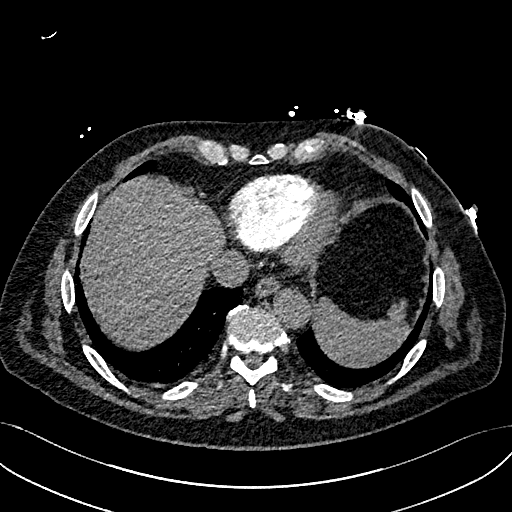
[im 114/378  lung]
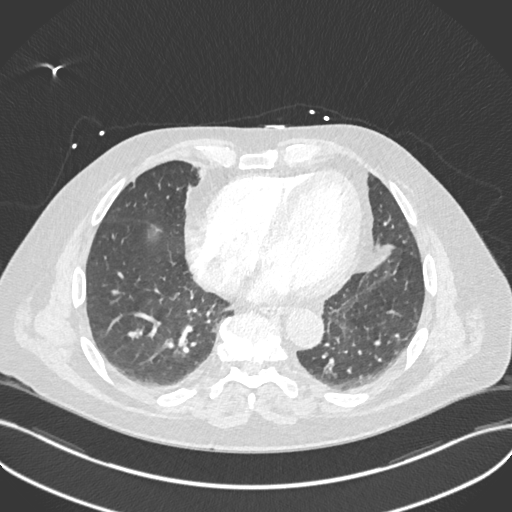
[im 132/378  mediastinal]
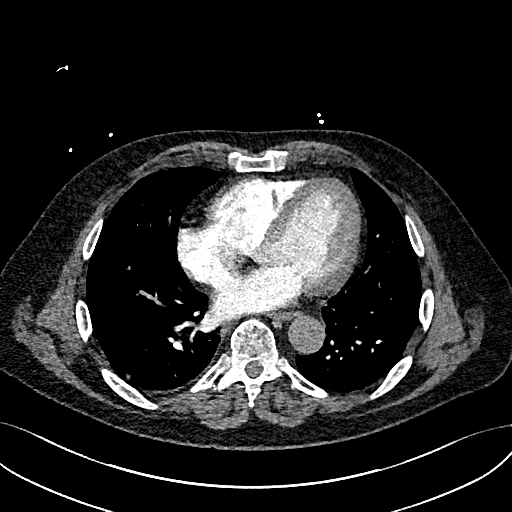
[im 151/378  lung]
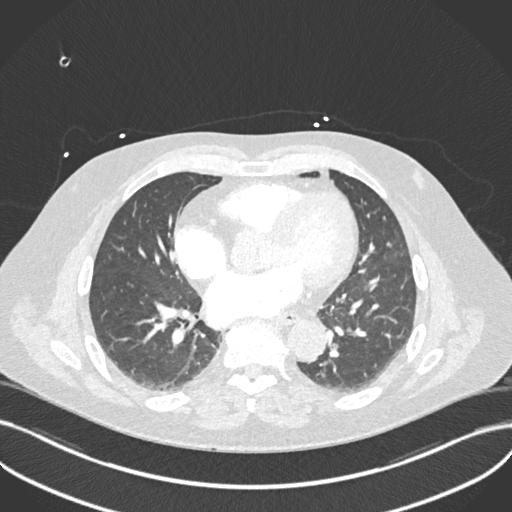
[im 170/378  mediastinal]
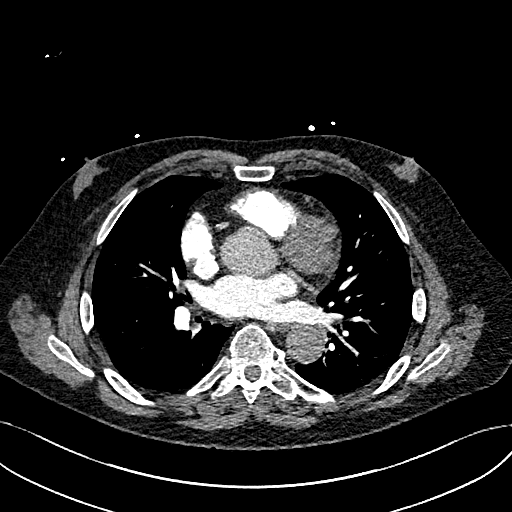
[im 189/378  lung]
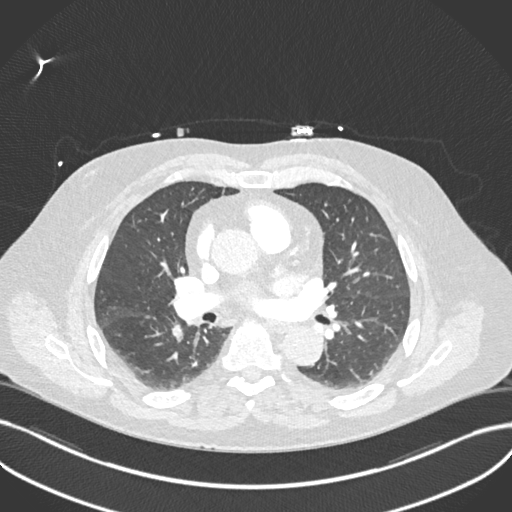
[im 208/378  mediastinal]
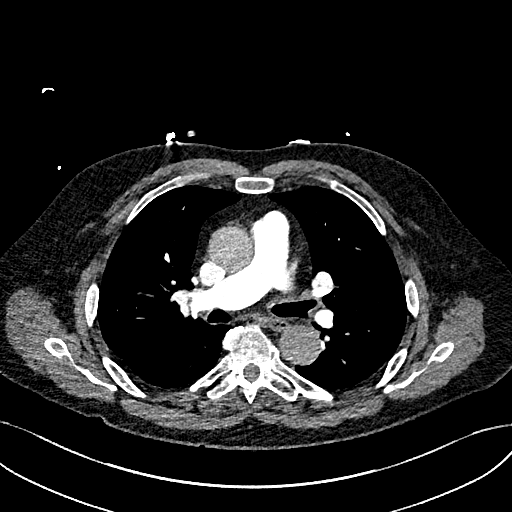
[im 227/378  lung]
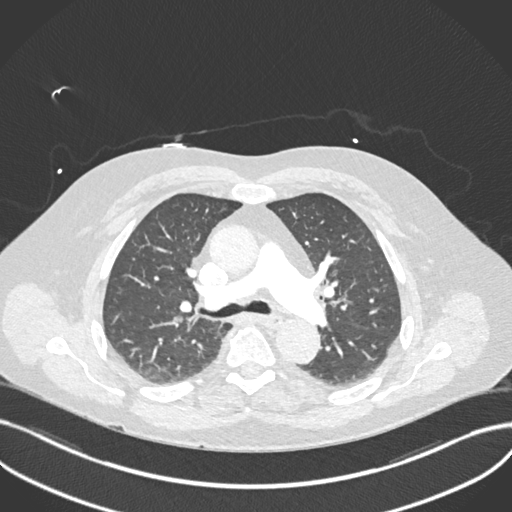
[im 246/378  mediastinal]
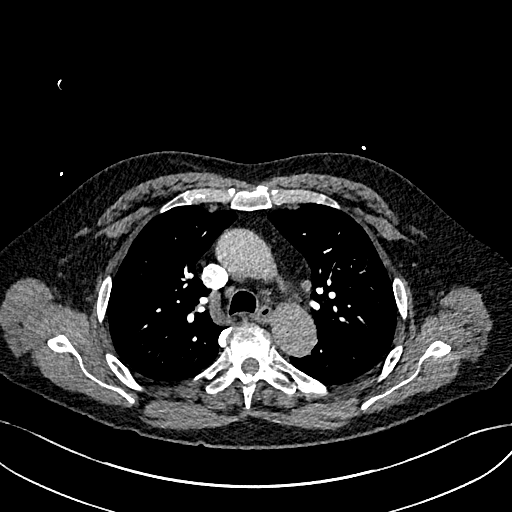
[im 264/378  lung]
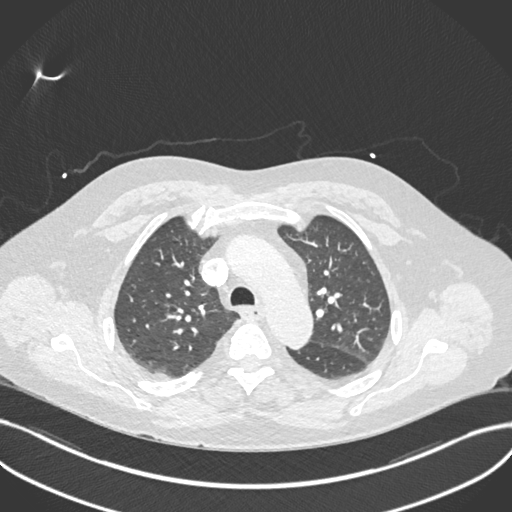
[im 302/378  mediastinal]
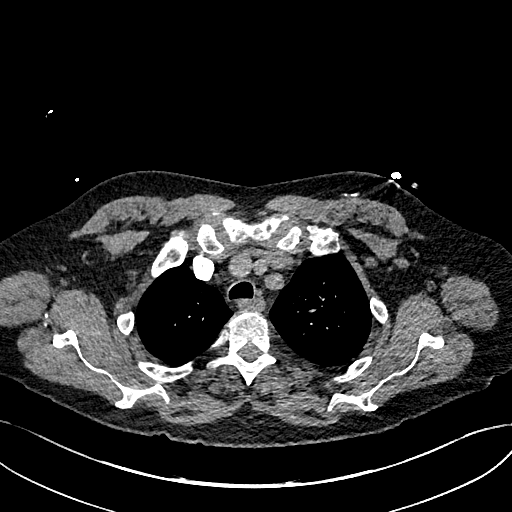
[im 321/378  lung]
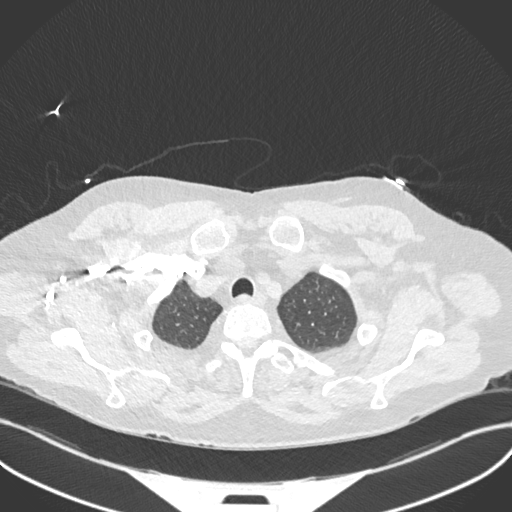
[im 340/378  mediastinal]
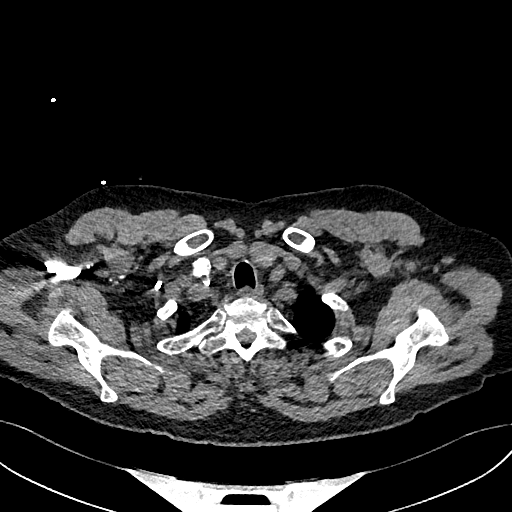
[im 359/378  lung]
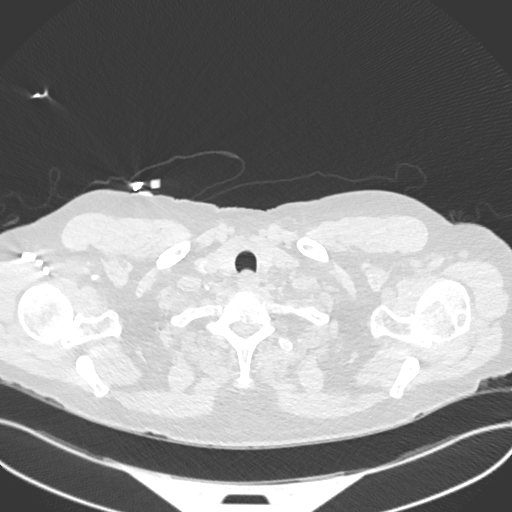

[Series 8: pe 2mm cor · coronal · 0.53mm/px · 1 of 130 slices shown]
[im 65/130  mediastinal]
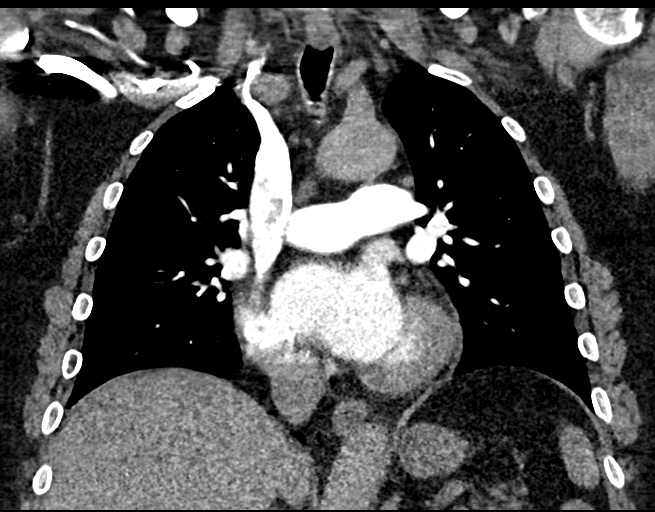

[18 of 36 positions shown; findings below may reference images not displayed]

RADIATION DOSE REDUCTION: This exam was performed according to the
departmental dose-optimization program which includes automated
exposure control, adjustment of the mA and/or kV according to
patient size and/or use of iterative reconstruction technique.

CONTRAST:  42mL OMNIPAQUE IOHEXOL 350 MG/ML SOLN
FINDINGS: Cardiovascular: There are filling defects within the right upper
lobar, right middle segmental, and segmental and subsegmental
branches of the right lower lobe. This is in a similar distribution
to prior pulmonary emboli, and at least some of this clot appears
chronic and adherent to the vessel wall. The lateral right lower
lobe clot may be new from prior exam. Overall thromboembolic burden
is small. There are no left-sided pulmonary emboli. There is no
evidence of right heart strain. Mild aortic tortuosity and
atherosclerosis. Coronary artery calcifications. No pericardial
effusion.

Mediastinum/Nodes: No mediastinal or hilar adenopathy. No thyroid
nodule. Small hiatal hernia. Otherwise decompressed esophagus.

Lungs/Pleura: No pulmonary infarct. Subpleural right lower lobe
pulmonary nodule measures 5 mm, series 6, image 87, smaller than on
prior when it measured 6 mm, although subjectively decreased in
density. No confluent airspace disease. No pleural effusion. No
features of pulmonary edema. The trachea and central bronchi are
patent.

Upper Abdomen: No acute upper abdominal findings.

Musculoskeletal: Thoracic spondylosis. There are no acute or
suspicious osseous abnormalities. Bilateral gynecomastia. Remote
healed left anterior rib fracture.

Review of the MIP images confirms the above findings.
IMPRESSION: 1. Right-sided pulmonary emboli. Some of this clot is chronic when
compared to prior exam. The lateral right lower lobe clot is likely
new from prior exam. Overall thromboembolic burden is small. No
evidence of right heart strain.
2. No pulmonary infarct or acute.
3. Right lower lobe pulmonary nodule is 5 mm, smaller than on prior
when it measured 6 mm. This favors a benign etiology.

Aortic Atherosclerosis ([FF]-[FF]).

Critical Value/emergent results were called by telephone at the time
PRETTY , who verbally acknowledged these results.

## 2022-02-11 MED ORDER — HEPARIN BOLUS VIA INFUSION
5000.0000 [IU] | Freq: Once | INTRAVENOUS | Status: AC
  Administered 2022-02-12: 5000 [IU] via INTRAVENOUS
  Filled 2022-02-11: qty 5000

## 2022-02-11 MED ORDER — LABETALOL HCL 5 MG/ML IV SOLN
10.0000 mg | Freq: Once | INTRAVENOUS | Status: AC
  Administered 2022-02-11: 10 mg via INTRAVENOUS
  Filled 2022-02-11: qty 4

## 2022-02-11 MED ORDER — HEPARIN (PORCINE) 25000 UT/250ML-% IV SOLN
1200.0000 [IU]/h | INTRAVENOUS | Status: DC
  Administered 2022-02-12: 1200 [IU]/h via INTRAVENOUS
  Filled 2022-02-11: qty 250

## 2022-02-11 MED ORDER — IOHEXOL 350 MG/ML SOLN
42.0000 mL | Freq: Once | INTRAVENOUS | Status: AC | PRN
  Administered 2022-02-11: 23:00:00 42 mL via INTRAVENOUS

## 2022-02-11 MED ORDER — LABETALOL HCL 5 MG/ML IV SOLN
10.0000 mg | Freq: Once | INTRAVENOUS | Status: AC
  Administered 2022-02-11: 23:00:00 10 mg via INTRAVENOUS
  Filled 2022-02-11: qty 4

## 2022-02-11 NOTE — Telephone Encounter (Signed)
Pt was call back and transfer to access nurse ?

## 2022-02-11 NOTE — Telephone Encounter (Signed)
[facility] - Client ?TELEPHONE ADVICE RECORD ?AccessNurse? ?Patient ?Name: ?Kolsen MCC ?AULEY ?Gender: Male ?DOB: [birth year] ?Age: 71 Y 34 M 9 D ?Return ?Phone ?Number: ?[id number] ?(Primary), ?[id number] ?(Secondary) ?Address: ?City/ ?State/ ?Zip: ?[place] ? [address] ?Client [facility] - Client ?Client Site [facility] ?Provider [name] - MD ?Contact Type Call ?Who Is Calling Patient / Member / Family / Caregiver ?Call Type Triage / Clinical ?Relationship To Patient Self ?Return Phone Number [phone] (Primary) ?Chief Complaint Flank Pain ?Reason for Call Symptomatic / Request for Health Information ?Initial Comment Caller states he is having dull to sharp pain on his ?right side. They have no appointments today. ?Translation No ?Nurse Assessment ?Nurse: Sheppard Plumber, RN, [name] Date/Time (Eastern Time): 02/11/2022 3:17:19 PM ?Confirm and document reason for call. If ?symptomatic, describe symptoms. ?---caller states he has upper right sided chest pain. ?recently had PE 3-4 months ago. not on blood thinner ?now. ?Does the patient have any new or worsening ?symptoms? ---Yes ?Will a triage be completed? ---Yes ?Related visit to physician within the last 2 weeks? ---No ?Does the PT have any chronic conditions? (i.e. ?diabetes, asthma, this includes High risk factors for ?pregnancy, etc.) ?---Yes ?List chronic conditions. ---CA- stage 4 prostate- mets to bones and lymph ?nodes on oral chemo and hormone shots ?Is this a behavioral health or substance abuse call? ---No ?Guidelines ?Guideline Title Affirmed Question Affirmed Notes Nurse Date/Time (Eastern ?Time) ?Chest Pain History of prior ?"blood clot" in leg ?or lungs (i.e., deep ?vein thrombosis, ?pulmonary embolism) ?[place], RN, ?[name] ?02/11/2022 3:19:46 PM ?Disp. Time (Eastern ?Time) Disposition Final User ?02/11/2022 3:25:44 PM Go to ED Now Yes Hum[place], RN, [name] ?PLEASE  NOTE: All timestamps contained within this report are represented as [place] Standard Time. ?CONFIDENTIALTY NOTICE: This fax transmission is intended only for the addressee. It contains information that is legally privileged, confidential or ?otherwise protected from use or disclosure. If you are not the intended recipient, you are strictly prohibited from reviewing, disclosing, copying using ?or disseminating any of this information or taking any action in reliance on or regarding this information. If you have received this fax in error, please ?notify us immediately by telephone so that we can arrange for its return to [place]. Phone: [phone], Toll-Free: [phone], Fax: [phone] ?Page: 2 of 2 ?Call Id: [id number] ?Caller Disagree/Comply Comply ?Caller Understands Yes ?PreDisposition Call Doctor ?Care Advice Given Per Guideline ?GO TO ED NOW: * You need to be seen in the Emergency Department. * Go to the ED at ___________ [place] ?given per Chest Pain (Adult) guideline. ?Referrals ?[facility]

## 2022-02-11 NOTE — Telephone Encounter (Signed)
error 

## 2022-02-11 NOTE — ED Provider Triage Note (Signed)
Emergency Medicine Provider Triage Evaluation Note ? ?Shane [name] , a 71 y.o. male  was evaluated in triage.  Pt complains of chest pain.  Patient states that he has had 3 days of intermittent right-sided chest pain that he describes as sometimes being sharp or dull.  He states that it intermittently radiates down the right arm and his right back.  He denies shortness of breath, palpitations, lower extremity swelling, cough, hemoptysis.  He denies fevers.  He is currently undergoing hormone therapy for prostate cancer.  He had previous PE likely from malignancy and was on Eliquis for 6 months which was discontinued in January.. ? ?Review of Systems  ?Positive: See above ?Negative:  ? ?Physical Exam  ?Ht 6' (1.829 m)   Wt 92.7 kg   BMI 27.72 kg/m?  ?Gen:   Awake, no distress   ?Resp:  Normal effort, lungs clear bilaterally ?MSK:   Moves extremities without difficulty  ?Other:  S1/S2 without murmur, pulses 2+ bilateral radial ?Medical Decision Making  ?Medically screening exam initiated at 4:37 PM.  Appropriate orders placed.  Currie Dennin was informed that the remainder of the evaluation will be completed by another provider, this initial triage assessment does not replace that evaluation, and the importance of remaining in the ED until their evaluation is complete. ? ?Did not place D-dimer at this time as patient will likely have scan if cardiac work-up negative given history. ?  ?Mickie Hillier, PA-C ?02/11/22 1638 ? ?

## 2022-02-11 NOTE — ED Triage Notes (Signed)
Pt arrived via POV for c/o 7/10 sharp pain in right chest that radiates to right upper abdomen and into right upper backx3 days. Pt denies N/V, SOB. Pt does not appear to be in any distress. ?

## 2022-02-11 NOTE — Telephone Encounter (Signed)
Noted. Will await ER evaluation.  

## 2022-02-11 NOTE — Telephone Encounter (Signed)
Pt called in stated he's been having dull to sharp pain on his right side . And that he has stop taking  apixaban (ELIQUIS) 5 MG TABS tablet since 1/28 23 . And is concern . Please Advise [phone]  ?

## 2022-02-11 NOTE — Telephone Encounter (Signed)
Per chart review tab pt is presently at [facility] ED. Sending note to Dr Baldwin Crown CMA ?

## 2022-02-11 NOTE — Telephone Encounter (Signed)
Per Amy, plz call pt back and have him evaluated by Access Nurse. ?

## 2022-02-11 NOTE — ED Provider Notes (Signed)
[facility] EMERGENCY DEPARTMENT Provider Note   CSN: [id number] Arrival date & time: 02/11/22  1627     History  Chief Complaint  Patient presents with   Chest Pain    Shane [name] is a 71 y.o. male.  HPI      71 year old male with a history of advanced prostate cancer pulmonary embolus who has completed 6 months of Eliquis, (hypertension written by dr but not on medication, monitoring, mildly elevated in the office) who presents with concern for right sided chest pain.   3-4 days ago felt like a pulled muscle on the right, sometimes going to the back, felt like the last PE. Coming and going.  This AM woke him from sleep and had it the rest of the morning.  No shortness of breath, (has some chronically but not with this), heating pad seemed to help but worse without it, not worse with deep breaths This feels identical to last blood clot No nausea, vomiting, abdominal pain Cough, not unusual cough, no fever, runny nose (just normal outside working)  Headache over the last week -10 days, across forehead Denies numbness, weakness, difficulty talking or walking, visual changes or facial droop.     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 ([name]) 06/28/2019   Cystoscopy [name]) - 2 bladder tumors, 1 on anterior prostate planned TURBT/TURP 06/2019 Biopsy - Gleason 5+4=9 prostate cancer [facility] started 07/2019 planned metastatic survey   History of chicken pox    Lipoma of neck    Seasonal allergies    Syncopal episodes 2007   x 2 s/p unrevealing hospitalization without recurrence     Home Medications Prior to Admission medications   Medication Sig Start Date End Date Taking? Authorizing Provider  abiraterone acetate (ZYTIGA) 250 MG tablet Take 4 tablets (1,000 mg total) by mouth daily. Take on an empty stomach 1 hour before or 2 hours after a meal 11/20/21  Yes Shadad, Mathis Dad, MD  APIXABAN [facility] [place])  VTE STARTER PACK ('10MG'$  AND '5MG'$ ) Take as directed on package: start with two-'5mg'$  tablets twice daily for 7 days. On day 8, switch to one-'5mg'$  tablet twice daily. 02/12/22  Yes [name], MD  atorvastatin (LIPITOR) 20 MG tablet Take 1 tablet (20 mg total) by mouth daily. 06/30/21  Yes [name], MD  Calcium-Magnesium-Vitamin D (CITRACAL SLOW RELEASE PO) Take 2 tablets by mouth daily.   Yes [provider]  Leuprolide Acetate, 4 Month, (ELIGARD) 30 MG injection Inject 30 mg into the skin every 4 (four) months.   Yes [provider]  predniSONE (DELTASONE) 5 MG tablet TAKE ONE TABLET BY MOUTH DAILY WITH BREAKFAST Patient taking differently: Take 5 mg by mouth daily with breakfast. 02/04/22  Yes [name], MD  venlafaxine XR (EFFEXOR-XR) 150 MG 24 hr capsule Take 1 capsule (150 mg total) by mouth daily with breakfast. 11/21/21  Yes [name], MD      Allergies    Bactrim [sulfamethoxazole-trimethoprim], Hydrocodone, Percocet [oxycodone-acetaminophen], Vancomycin, Penicillins, and Zithromax [azithromycin]    Review of Systems   Review of Systems  Physical Exam Updated Vital Signs BP (!) 167/83    Pulse 65    Temp 98.1 F (36.7 C) (Oral)    Resp 19    Ht 6' (1.829 m)    Wt 92.7 kg    SpO2 97%    BMI 27.72 kg/m  Physical Exam Vitals and nursing note reviewed.  Constitutional:  General: He is not in acute distress.    Appearance: He is well-developed. He is not diaphoretic.  HENT:     Head: Normocephalic and atraumatic.  Eyes:     Conjunctiva/sclera: Conjunctivae normal.  Cardiovascular:     Rate and Rhythm: Normal rate and regular rhythm.     Heart sounds: Normal heart sounds. No murmur heard.   No friction rub. No gallop.  Pulmonary:     Effort: Pulmonary effort is normal. No respiratory distress.     Breath sounds: Normal breath sounds. No wheezing or rales.  Abdominal:     General: There is no distension.     Palpations: Abdomen is soft.      Tenderness: There is no abdominal tenderness. There is no guarding.  Musculoskeletal:     Cervical back: Normal range of motion.  Skin:    General: Skin is warm and dry.  Neurological:     Mental Status: He is alert and oriented to person, place, and time.    ED Results / Procedures / Treatments   Labs (all labs ordered are listed, but only abnormal results are displayed) Labs Reviewed  BASIC METABOLIC PANEL - Abnormal; Notable for the following components:      Result Value   Calcium 10.4 (*)    All other components within normal limits  CBC - Abnormal; Notable for the following components:   RBC 3.90 (*)    Hemoglobin 12.8 (*)    HCT 37.5 (*)    All other components within normal limits  COMPREHENSIVE METABOLIC PANEL - Abnormal; Notable for the following components:   Glucose, Bld 109 (*)    All other components within normal limits  RESP PANEL BY RT-PCR (FLU A&B, COVID) ARPGX2  CBC WITH DIFFERENTIAL/PLATELET  HEPARIN LEVEL (UNFRACTIONATED)  TROPONIN I (HIGH SENSITIVITY)  TROPONIN I (HIGH SENSITIVITY)    EKG EKG Interpretation  Date/Time:  Thursday February 11 2022 16:42:59 EST Ventricular Rate:  70 PR Interval:  188 QRS Duration: 84 QT Interval:  414 QTC Calculation: 447 R Axis:   -4 Text Interpretation: Normal sinus rhythm Minimal voltage criteria for LVH, may be normal variant ( R in aVL ) Nonspecific ST abnormality Abnormal ECG When compared with ECG of 25-Jun-2021 12:01, No significant change since last tracing Confirmed by Gareth Morgan [phone]) on 02/11/2022 8:02:22 PM  Radiology CT Head Wo Contrast  Result Date: 02/11/2022 CLINICAL DATA:  Headache EXAM: CT HEAD WITHOUT CONTRAST TECHNIQUE: Contiguous axial images were obtained from the base of the skull through the vertex without intravenous contrast. RADIATION DOSE REDUCTION: This exam was performed according to the departmental dose-optimization program which includes automated exposure control, adjustment of  the mA and/or kV according to patient size and/or use of iterative reconstruction technique. COMPARISON:  CT brain 09/05/2005 FINDINGS: Brain: No acute territorial infarction, hemorrhage or intracranial mass. Minimal white matter hypodensity consistent with chronic small vessel ischemic change. The ventricles are nonenlarged Vascular: No hyperdense vessels.  Carotid vascular calcification Skull: Normal. Negative for fracture or focal lesion. Sinuses/Orbits: Mild mucosal thickening in the sinuses Other: None IMPRESSION: 1. No CT evidence for acute intracranial abnormality. 2. Mild chronic small vessel ischemic changes of the white matter Electronically Signed   By: Donavan Foil M.D.   On: 02/11/2022 23:19   CT Angio Chest PE W and/or Wo Contrast  Result Date: 02/11/2022 CLINICAL DATA:  Pulmonary embolism (PE) suspected, high prob Right-sided chest and abdominal pain. EXAM: CT ANGIOGRAPHY CHEST WITH CONTRAST TECHNIQUE: Multidetector CT imaging  of the chest was performed using the standard protocol during bolus administration of intravenous contrast. Multiplanar CT image reconstructions and MIPs were obtained to evaluate the vascular anatomy. RADIATION DOSE REDUCTION: This exam was performed according to the departmental dose-optimization program which includes automated exposure control, adjustment of the mA and/or kV according to patient size and/or use of iterative reconstruction technique. CONTRAST:  34m OMNIPAQUE IOHEXOL 350 MG/ML SOLN COMPARISON:  Chest radiograph 06/25/2021, chest CTA 06/23/2021, no interval chest imaging available. FINDINGS: Cardiovascular: There are filling defects within the right upper lobar, right middle segmental, and segmental and subsegmental branches of the right lower lobe. This is in a similar distribution to prior pulmonary emboli, and at least some of this clot appears chronic and adherent to the vessel wall. The lateral right lower lobe clot may be new from prior exam. Overall  thromboembolic burden is small. There are no left-sided pulmonary emboli. There is no evidence of right heart strain. Mild aortic tortuosity and atherosclerosis. Coronary artery calcifications. No pericardial effusion. Mediastinum/Nodes: No mediastinal or hilar adenopathy. No thyroid nodule. Small hiatal hernia. Otherwise decompressed esophagus. Lungs/Pleura: No pulmonary infarct. Subpleural right lower lobe pulmonary nodule measures 5 mm, series 6, image 87, smaller than on prior when it measured 6 mm, although subjectively decreased in density. No confluent airspace disease. No pleural effusion. No features of pulmonary edema. The trachea and central bronchi are patent. Upper Abdomen: No acute upper abdominal findings. Musculoskeletal: Thoracic spondylosis. There are no acute or suspicious osseous abnormalities. Bilateral gynecomastia. Remote healed left anterior rib fracture. Review of the MIP images confirms the above findings. IMPRESSION: 1. Right-sided pulmonary emboli. Some of this clot is chronic when compared to prior exam. The lateral right lower lobe clot is likely new from prior exam. Overall thromboembolic burden is small. No evidence of right heart strain. 2. No pulmonary infarct or acute. 3. Right lower lobe pulmonary nodule is 5 mm, smaller than on prior when it measured 6 mm. This favors a benign etiology. Aortic Atherosclerosis (ICD10-I70.0). Critical Value/emergent results were called by telephone at the time of interpretation on 02/11/2022 at 11:21 pm [facility] who verbally acknowledged these results. Electronically Signed   By: MKeith RakeM.D.   On: 02/11/2022 23:23    Procedures .Critical Care Performed by: SGareth Morgan MD Authorized by: SGareth Morgan MD   Critical care provider statement:    Critical care time (minutes):  30   Critical care was time spent personally by me on the following activities:  Development of treatment plan with patient or  surrogate, examination of patient, ordering and review of laboratory studies, ordering and review of radiographic studies, ordering and performing treatments and interventions, pulse oximetry, re-evaluation of patient's condition and review of old charts    Medications Ordered in ED Medications  acetaminophen (TYLENOL) tablet 650 mg (650 mg Oral Given 02/12/22 0747)  abiraterone acetate (ZYTIGA) tablet 1,000 mg (1,000 mg Oral Not Given 02/12/22 1008)  venlafaxine XR (EFFEXOR-XR) 24 hr capsule 150 mg (150 mg Oral Given 02/12/22 0747)  predniSONE (DELTASONE) tablet 5 mg (5 mg Oral Given 02/12/22 0747)  hydrALAZINE (APRESOLINE) injection 5 mg (5 mg Intravenous Given 02/12/22 0748)  labetalol (NORMODYNE) injection 10 mg (10 mg Intravenous Given 02/11/22 2234)  iohexol (OMNIPAQUE) 350 MG/ML injection 42 mL (42 mLs Intravenous Contrast Given 02/11/22 2309)  labetalol (NORMODYNE) injection 10 mg (10 mg Intravenous Given 02/11/22 2347)  heparin bolus via infusion 5,000 Units (5,000 Units Intravenous Bolus from Bag 02/12/22  0003)  apixaban (ELIQUIS) tablet 10 mg (10 mg Oral Given 02/12/22 1008)    ED Course/ Medical Decision Making/ A&P                           Medical Decision Making Amount and/or Complexity of Data Reviewed Radiology: ordered.  Risk Prescription drug management. Decision regarding hospitalization.   71 year old male with a history of advanced prostate cancer pulmonary embolus who has completed 6 months of Eliquis, (hypertension written by dr but not on medication, monitoring, mildly elevated in the office) who presents with concern for right sided chest pain.   Differential diagnosis for chest pain includes pulmonary embolus, dissection, pneumothorax, pneumonia, ACS, myocarditis, pericarditis.  EKG was done and evaluate by me and showed no acute ST changes and no signs of pericarditis. Patient is low risk HEART score and had delta troponins which were both negative.  Do not feel history  or exam are consistent with aortic dissection.   Symptoms identical to his last PE, ordered CT PE study given high suspicion.  Also reports headaches-no neurologic changes, hx not consistent with SAH, obtained head CT to evaluate for signs of abnormalities given age, cancer hx.  CT head personally evaluated by me and radiology without acute changes. CT PE study returned with acute on chronic PE. Placed on heparin, will admit given high risk/cancer hx.    Additionally, he has new significant hypertension. Given labetalol '10mg'$  x 2. This may be etiology of his headache symptoms.        Final Clinical Impression(s) / ED Diagnoses Final diagnoses:  None    Rx / DC Orders ED Discharge Orders          Ordered    APIXABAN (ELIQUIS) VTE STARTER PACK ('10MG'$  AND '5MG'$ )        02/12/22 0938    Increase activity slowly        02/12/22 0938    Diet - low sodium heart healthy        02/12/22 0938    Call MD for:  difficulty breathing, headache or visual disturbances        02/12/22 0938    Call MD for:  severe uncontrolled pain        02/12/22 0938    Call MD for:  extreme fatigue        02/12/22 0938    Call MD for:  persistant dizziness or light-headedness        02/12/22 9758              Gareth Morgan, MD 02/12/22 1031

## 2022-02-12 ENCOUNTER — Other Ambulatory Visit (HOSPITAL_COMMUNITY): Payer: Self-pay

## 2022-02-12 DIAGNOSIS — I2699 Other pulmonary embolism without acute cor pulmonale: Secondary | ICD-10-CM | POA: Diagnosis not present

## 2022-02-12 DIAGNOSIS — C61 Malignant neoplasm of prostate: Secondary | ICD-10-CM | POA: Diagnosis not present

## 2022-02-12 LAB — CBC
HCT: 37.5 % — ABNORMAL LOW (ref 39.0–52.0)
Hemoglobin: 12.8 g/dL — ABNORMAL LOW (ref 13.0–17.0)
MCH: 32.8 pg (ref 26.0–34.0)
MCHC: 34.1 g/dL (ref 30.0–36.0)
MCV: 96.2 fL (ref 80.0–100.0)
Platelets: 212 10*3/uL (ref 150–400)
RBC: 3.9 MIL/uL — ABNORMAL LOW (ref 4.22–5.81)
RDW: 12.2 % (ref 11.5–15.5)
WBC: 8.4 10*3/uL (ref 4.0–10.5)
nRBC: 0 % (ref 0.0–0.2)

## 2022-02-12 LAB — COMPREHENSIVE METABOLIC PANEL
ALT: 23 U/L (ref 0–44)
AST: 26 U/L (ref 15–41)
Albumin: 3.8 g/dL (ref 3.5–5.0)
Alkaline Phosphatase: 44 U/L (ref 38–126)
Anion gap: 10 (ref 5–15)
BUN: 16 mg/dL (ref 8–23)
CO2: 23 mmol/L (ref 22–32)
Calcium: 9.3 mg/dL (ref 8.9–10.3)
Chloride: 103 mmol/L (ref 98–111)
Creatinine, Ser: 1.16 mg/dL (ref 0.61–1.24)
GFR, Estimated: >60 mL/min (ref >60)
Glucose, Bld: 109 mg/dL — ABNORMAL HIGH (ref 70–99)
Potassium: 3.7 mmol/L (ref 3.5–5.1)
Sodium: 136 mmol/L (ref 135–145)
Total Bilirubin: 0.7 mg/dL (ref 0.3–1.2)
Total Protein: 6.6 g/dL (ref 6.5–8.1)

## 2022-02-12 LAB — RESP PANEL BY RT-PCR (FLU A&B, COVID) ARPGX2
Influenza A by PCR: NEGATIVE
Influenza B by PCR: NEGATIVE
SARS Coronavirus 2 by RT PCR: NEGATIVE

## 2022-02-12 LAB — HEPARIN LEVEL (UNFRACTIONATED): Heparin Unfractionated: 0.46 IU/mL (ref 0.30–0.70)

## 2022-02-12 MED ORDER — ABIRATERONE ACETATE 250 MG PO TABS: 1000.0000 mg | ORAL_TABLET | Freq: Every day | ORAL | Status: DC

## 2022-02-12 MED ORDER — PREDNISONE 5 MG PO TABS
5.0000 mg | ORAL_TABLET | Freq: Every day | ORAL | Status: DC
  Administered 2022-02-12: 5 mg via ORAL
  Filled 2022-02-12: qty 1

## 2022-02-12 MED ORDER — HYDRALAZINE HCL 20 MG/ML IJ SOLN
5.0000 mg | Freq: Four times a day (QID) | INTRAMUSCULAR | Status: DC | PRN
  Administered 2022-02-12: 5 mg via INTRAVENOUS
  Filled 2022-02-12: qty 1

## 2022-02-12 MED ORDER — APIXABAN (ELIQUIS) VTE STARTER PACK (10MG AND 5MG): ORAL_TABLET | ORAL | 0 refills | Status: DC

## 2022-02-12 MED ORDER — APIXABAN 5 MG PO TABS
10.0000 mg | ORAL_TABLET | Freq: Once | ORAL | Status: AC
  Administered 2022-02-12: 10 mg via ORAL
  Filled 2022-02-12: qty 2

## 2022-02-12 MED ORDER — VENLAFAXINE HCL ER 150 MG PO CP24
150.0000 mg | ORAL_CAPSULE | Freq: Every day | ORAL | Status: DC
  Administered 2022-02-12: 150 mg via ORAL
  Filled 2022-02-12: qty 1

## 2022-02-12 MED ORDER — ACETAMINOPHEN 325 MG PO TABS
650.0000 mg | ORAL_TABLET | Freq: Four times a day (QID) | ORAL | Status: DC | PRN
  Administered 2022-02-12 (×2): 650 mg via ORAL
  Filled 2022-02-12: qty 2

## 2022-02-12 NOTE — H&P (Signed)
?History and Physical  ? ? ?Patient: Shane [name] IOM:[id number] DOB: 1951-05-05 ?DOA: 02/11/2022 ?DOS: the patient was seen and examined on 02/12/2022 ?PCP: [name], MD  ?Patient coming from: Home ? ?Chief Complaint:  ?Chief Complaint  ?Patient presents with  ? Chest Pain  ? ?HPI: Shane [name] is a 71 y.o. male with medical history significant of advanced prostate cancer on antiandrogenic therapy, hx of PE previously on Eliquis and HLD who presents with right sided chest pain.  ? ?Had right chest chest pain for about 3 days with radiation to the back at times. No shortness of breath, No LE pain. He was previously on Eliquis for 6 months for PE and just discontinued in January.  ? ?CTA chest in the ED showed right-sided pulmonary emboli.  Some of the clot is chronic from prior exam.  New lateral right lower lobe clot with overall small thromboembolic burden.  No evidence of right heart strain. ?CBC and BMP otherwise unremarkable. ? ?Review of Systems: As mentioned in the history of present illness. All other systems reviewed and are negative. ?Past Medical History:  ?Diagnosis Date  ? Cancer involving bladder by direct extension from prostate ([place]) 06/28/2019  ? Cystoscopy [name]) - 2 bladder tumors, 1 on anterior prostate planned TURBT/TURP 06/2019 Biopsy - Gleason 5+4=9 prostate cancer [facility] started 07/2019 planned metastatic survey  ? History of chicken pox   ? Lipoma of neck   ? Seasonal allergies   ? Syncopal episodes 2007  ? x 2 s/p unrevealing hospitalization without recurrence  ? ?Past Surgical History:  ?Procedure Laterality Date  ? CYSTOSCOPY W/ RETROGRADES Bilateral 07/06/2019  ? Procedure: [place], BILATERAL STENT PLACEMENT;  Surgeon: [name], MD;  Location: [facility];  Service: Urology;  Laterality: Bilateral;  ? EP IMPLANTABLE DEVICE N/A 02/25/2016  ? Procedure: Loop Recorder Removal;  Surgeon: Deboraha Sprang, MD;  Location: [facility];  Service:  Cardiovascular;  Laterality: N/A;  ? Internal Cardiac Monitor  2007  ? due to syncopal episodes - never contacted for f/u  ? IR NEPHROSTOMY PLACEMENT LEFT  02/14/2020  ? IR NEPHROSTOMY PLACEMENT RIGHT  02/14/2020  ? LIPOMA EXCISION N/A 02/09/2016  ? Procedure: EXCISION NECK LIPOMA;  Surgeon: Autumn Messing III, MD;  Location: [facility];  Service: General;  Laterality: N/A;  ? TRANSURETHRAL RESECTION OF BLADDER TUMOR N/A 07/06/2019  ? Procedure: TRANSURETHRAL RESECTION OF BLADDER TUMOR (TURBT);  Surgeon: [name], MD;  Location: [facility];  Service: Urology;  Laterality: N/A;  ? ?Social History:  reports that he has never smoked. He has never used smokeless tobacco. He reports current alcohol use of about 5.0 standard drinks per week. He reports that he does not use drugs. ? ?Allergies  ?Allergen Reactions  ? Bactrim [Sulfamethoxazole-Trimethoprim] Hives  ?  Red rash, hives  ? Hydrocodone   ? Percocet [Oxycodone-Acetaminophen] Other (See Comments)  ?  TURNS RED FROM CHEST UP, LOOKS LIKE SUNBURN AND STARTS TO [place] BADLY  ? Vancomycin   ? Penicillins Swelling and Rash  ?  Did it involve swelling of the face/tongue/throat, SOB, or low BP? No ?Did it involve sudden or severe rash/hives, skin peeling, or any reaction on the inside of your mouth or nose? No ?Did you need to seek medical attention at a hospital or doctor's office? No ?When did it last happen?      50 years ago ?If all above answers are ?NO?, may proceed with cephalosporin use. ?  ?  Zithromax [Azithromycin] Rash  ? ? ?Family History  ?Problem Relation Age of Onset  ? Hyperlipidemia Mother   ? Cancer Mother   ?     peritoneal cancer  ? Hemochromatosis Father   ?     s/p liver transplant - pt screened negative 07/2015  ? Hemochromatosis Paternal Grandmother   ? Hemochromatosis Paternal Uncle   ? Stroke Neg Hx   ? Diabetes Neg Hx   ? CAD Neg Hx   ? ? ?Prior to Admission medications   ?Medication Sig Start Date End Date Taking? Authorizing Provider   ?abiraterone acetate (ZYTIGA) 250 MG tablet Take 4 tablets (1,000 mg total) by mouth daily. Take on an empty stomach 1 hour before or 2 hours after a meal 11/20/21   Shadad, Mathis Dad, MD  ?apixaban (ELIQUIS) 5 MG TABS tablet TAKE ONE TABLET BY MOUTH TWICE A DAY 01/21/22   Tonia Ghent, MD  ?atorvastatin (LIPITOR) 20 MG tablet Take 1 tablet (20 mg total) by mouth daily. 06/30/21   [name], MD  ?Calcium-Magnesium-Vitamin D (CITRACAL SLOW RELEASE PO) Take 2 tablets by mouth daily.    [provider]  ?Leuprolide Acetate, 4 Month, (ELIGARD) 30 MG injection Inject 30 mg into the skin every 4 (four) months.    [provider]  ?predniSONE (DELTASONE) 5 MG tablet TAKE ONE TABLET BY MOUTH DAILY WITH BREAKFAST 02/04/22   [name], MD  ?venlafaxine XR (EFFEXOR-XR) 150 MG 24 hr capsule Take 1 capsule (150 mg total) by mouth daily with breakfast. 11/21/21   [name], MD  ? ? ?Physical Exam: ?Vitals:  ? 02/11/22 2130 02/11/22 2235 02/11/22 2236 02/11/22 2345  ?BP: (!) 201/62 (!) 166/130 (!) 173/107 (!) 148/88  ?Pulse: 63 63 62 (!) 56  ?Resp: '14 15 18 16  '$ ?Temp:      ?TempSrc:      ?SpO2: 100% 98% 98% 99%  ?Weight:      ?Height:      ? ?Constitutional: NAD, calm, comfortable, elderly gentleman sitting upright in bed eating sandwich ?Eyes:  lids and conjunctivae normal ?ENMT: Mucous membranes are moist.  ?Neck: normal, supple ?Respiratory: clear to auscultation bilaterally, no wheezing, no crackles. Normal respiratory effort. No accessory muscle use.  ?Cardiovascular: Regular rate and rhythm, no murmurs / rubs / gallops. No extremity edema.  ?Abdomen: no tenderness,  Bowel sounds positive.  ?Musculoskeletal: no clubbing / cyanosis. No joint deformity upper and lower extremities. Good ROM, no contractures. Normal muscle tone.  ?Skin: no rashes, lesions, ulcers. No induration ?Neurologic: CN 2-12 grossly intact.Strength 5/5 in all 4.  ?Psychiatric: Normal judgment and insight. Alert and  oriented x 3. Normal mood. ?Data Reviewed: ? ?See HPI ? ?Assessment and Plan: ?* Pulmonary embolism ([place]) ?-right sided PE with chronic clot and new seen on lateral right lobe. Overall small thromboembolic burden. No evidence of right heart strain. Pt recently on Eliquis for PE and just d/c in January. Continue IV heparin started in ED and will need to transition to Eliquis life long therapy ? ?Elevated blood pressure reading ?Systolic up to 629 in ED. Improved after total of '20mg'$  IV labetalol in ED. ?-continue to monitor and have PRN hydralazine for BP 180/110  ? ?Malignant neoplasm of prostate ([place]) ?Continue Zytiga with '5mg'$  predisone ?-also on Eligard q4 months ?-follows with oncology Dr. Alen Blew ? ?HLD (hyperlipidemia) ?Pt Rx statin recently but has felt fatigue. Advised discussion with PCP for further management.  ? ? ? ? ? Advance Care  Planning:   Code Status: Full Code  ? ?Consults: none ? ?Family Communication: No family at bedside ? ?Severity of Illness: ?The appropriate patient status for this patient is OBSERVATION. Observation status is judged to be reasonable and necessary in order to provide the required intensity of service to ensure the patient's safety. The patient's presenting symptoms, physical exam findings, and initial radiographic and laboratory data in the context of their medical condition is felt to place them at decreased risk for further clinical deterioration. Furthermore, it is anticipated that the patient will be medically stable for discharge from the hospital within 2 midnights of admission.  ? ?Author: Orene Desanctis, DO ?02/12/2022 2:33 AM ? ?For on call review [email or web address].  ?

## 2022-02-12 NOTE — ED Notes (Signed)
Lab at [facility], heparin level and CMP obtained. ?

## 2022-02-12 NOTE — ED Notes (Signed)
Pt alert, NAD, calm, interactive, resps e/u, speaking in clear complete sentences, VSS. BP remains elevated. Mentions HA. Denies other pain, nausea, sob, dizziness, numbness/ tingling or other sx.   ?

## 2022-02-12 NOTE — Progress Notes (Addendum)
ANTICOAGULATION CONSULT NOTE ? ?Pharmacy Consult for Heparin  ?Indication: pulmonary embolus ? ?Allergies  ?Allergen Reactions  ? Bactrim [Sulfamethoxazole-Trimethoprim] Hives  ?  Red rash, hives  ? Hydrocodone   ? Percocet [Oxycodone-Acetaminophen] Other (See Comments)  ?  TURNS RED FROM CHEST UP, LOOKS LIKE SUNBURN AND STARTS TO [place] BADLY  ? Vancomycin   ? Penicillins Swelling and Rash  ?  Did it involve swelling of the face/tongue/throat, SOB, or low BP? No ?Did it involve sudden or severe rash/hives, skin peeling, or any reaction on the inside of your mouth or nose? No ?Did you need to seek medical attention at a hospital or doctor's office? No ?When did it last happen?      50 years ago ?If all above answers are ?NO?, may proceed with cephalosporin use. ?  ? Zithromax [Azithromycin] Rash  ? ? ?Patient Measurements: ?Height: 6' (182.9 cm) ?Weight: 92.7 kg (204 lb 5.9 oz) ?IBW/kg (Calculated) : 77.6 ? ?Vital Signs: ?BP: 178/87 (03/10 0730) ?Pulse Rate: 55 (03/10 0730) ? ?Labs: ?Recent Labs  ?  02/11/22 ?1639 02/11/22 ?1812 02/12/22 ?0938 02/12/22 ?0805  ?HGB 14.2  --  12.8*  --   ?HCT 43.1  --  37.5*  --   ?PLT 238  --  212  --   ?HEPARINUNFRC  --   --   --  0.46  ?CREATININE 1.15  --   --   --   ?TROPONINIHS 12 14  --   --   ? ? ? ?Estimated Creatinine Clearance: 64.7 mL/min (by C-G formula based on SCr of 1.15 mg/dL). ? ? ?Medical History: ?Past Medical History:  ?Diagnosis Date  ? Cancer involving bladder by direct extension from prostate ([place]) 06/28/2019  ? Cystoscopy [name]) - 2 bladder tumors, 1 on anterior prostate planned TURBT/TURP 06/2019 Biopsy - Gleason 5+4=9 prostate cancer [facility] started 07/2019 planned metastatic survey  ? History of chicken pox   ? Lipoma of neck   ? Seasonal allergies   ? Syncopal episodes 2007  ? x 2 s/p unrevealing hospitalization without recurrence  ? ? ?Assessment: ?71 y/o M with history of prostate cancer and PE who has been off apixaban for a little over a month  (discontinued after 6 months of treatment) presenting with CP. CT angio showed acute PE in addition to some chronic clot, overall low thromboembolic burden with no evidence of RHS. Pharmacy consulted to dose heparin.  SCr appears to be at baseline on admit. ? ?Initial heparin level therapeutic at 0.46. Hg down to 12.8, plt wnl. No bleed issues reported. ? ?Goal of Therapy:  ?Heparin level 0.3-0.7 units/ml ?Monitor platelets by anticoagulation protocol: Yes ?  ?Plan:  ?Continue heparin drip at 1200 units/hr ?F/u 6hr heparin level to confirm ?Monitor daily heparin level and CBC, s/sx bleeding ?F/u plan to transition to lifelong oral anticoagulation ? ? ?Arturo Morton, PharmD, BCPS ?Please check AMION for all [facility] contact numbers ?Clinical Pharmacist ?02/12/2022 9:14 AM ? ?

## 2022-02-12 NOTE — Assessment & Plan Note (Signed)
Pt Rx statin recently but has felt fatigue. Advised discussion with PCP for further management.  ?

## 2022-02-12 NOTE — TOC Benefit Eligibility Note (Signed)
Patient Advocate Encounter ?  ?Insurance verification completed.   ?  ?The patient is currently admitted and upon discharge could be taking XARELTO. ?  ?The current 30 day co-pay is, $45.  ? ?The patient is currently admitted and upon discharge could be taking ELIQUIS. ?  ?The current 30 day co-pay is, $45.  ? ?The patient is insured through [employer]. ? ? ?  ? ?

## 2022-02-12 NOTE — ED Notes (Signed)
Pt up to b/r, steady gait.  ?

## 2022-02-12 NOTE — Progress Notes (Signed)
ANTICOAGULATION CONSULT NOTE - Initial Consult ? ?Pharmacy Consult for Heparin  ?Indication: pulmonary embolus ? ?Allergies  ?Allergen Reactions  ? Bactrim [Sulfamethoxazole-Trimethoprim] Hives  ?  Red rash, hives  ? Hydrocodone   ? Percocet [Oxycodone-Acetaminophen] Other (See Comments)  ?  TURNS RED FROM CHEST UP, LOOKS LIKE SUNBURN AND STARTS TO [place] BADLY  ? Vancomycin   ? Penicillins Swelling and Rash  ?  Did it involve swelling of the face/tongue/throat, SOB, or low BP? No ?Did it involve sudden or severe rash/hives, skin peeling, or any reaction on the inside of your mouth or nose? No ?Did you need to seek medical attention at a hospital or doctor's office? No ?When did it last happen?      50 years ago ?If all above answers are ?NO?, may proceed with cephalosporin use. ?  ? Zithromax [Azithromycin] Rash  ? ? ?Patient Measurements: ?Height: 6' (182.9 cm) ?Weight: 92.7 kg (204 lb 5.9 oz) ?IBW/kg (Calculated) : 77.6 ? ?Vital Signs: ?Temp: 98.1 ?F (36.7 ?C) (03/09 1637) ?Temp Source: Oral (03/09 1637) ?BP: 148/88 (03/09 2345) ?Pulse Rate: 56 (03/09 2345) ? ?Labs: ?Recent Labs  ?  02/11/22 ?1639 02/11/22 ?1812  ?HGB 14.2  --   ?HCT 43.1  --   ?PLT 238  --   ?CREATININE 1.15  --   ?TROPONINIHS 12 14  ? ? ?Estimated Creatinine Clearance: 64.7 mL/min (by C-G formula based on SCr of 1.15 mg/dL). ? ? ?Medical History: ?Past Medical History:  ?Diagnosis Date  ? Cancer involving bladder by direct extension from prostate ([place]) 06/28/2019  ? Cystoscopy [name]) - 2 bladder tumors, 1 on anterior prostate planned TURBT/TURP 06/2019 Biopsy - Gleason 5+4=9 prostate cancer [facility] started 07/2019 planned metastatic survey  ? History of chicken pox   ? Lipoma of neck   ? Seasonal allergies   ? Syncopal episodes 2007  ? x 2 s/p unrevealing hospitalization without recurrence  ? ? ?Assessment: ?71 y/o M with history of PE who has been off Apixaban for a little over a month. In the ED with chest pain. CT angio with some acute PE in  addition to some chronic clot. Starting heparin. CBC/renal function good.  ? ?Goal of Therapy:  ?Heparin level 0.3-0.7 units/ml ?Monitor platelets by anticoagulation protocol: Yes ?  ?Plan:  ?Heparin 5000 units BOLUS ?Start heparin drip at 1200 units/hr ?8 hour heparin level ?Daily CBC/Heparin level ?Monitor for bleeding ? ?Narda Bonds, PharmD, BCPS ?Clinical Pharmacist ?Phone: [phone] ? ? ?

## 2022-02-12 NOTE — Telephone Encounter (Addendum)
Admitted with R PE will need lifelong AC therapy.  ?

## 2022-02-12 NOTE — Assessment & Plan Note (Signed)
Continue Zytiga with '5mg'$  predisone ?-also on Eligard q4 months ?-follows with oncology Dr. Alen Blew ?

## 2022-02-12 NOTE — Assessment & Plan Note (Addendum)
-  right sided PE with chronic clot and new seen on lateral right lobe. Overall small thromboembolic burden. No evidence of right heart strain. Pt recently on Eliquis for PE and just d/c in January. Continue IV heparin started in ED and will need to transition to Eliquis life long therapy ?

## 2022-02-12 NOTE — Discharge Summary (Signed)
Physician Discharge Summary  Shane [name] ZOX:[id number] DOB: [birth year] DOA: 02/11/2022  PCP: [name], MD  Admit date: 02/11/2022 Discharge date: 02/12/2022  Admitted From: Home Disposition: Home  Recommendations for Outpatient Follow-up:  Follow up with PCP in 1-2 weeks Follow-up with oncology as scheduled. Continue to take Eliquis uninterrupted as you were doing in the past.  Home Health: N/A Equipment/Devices: N/A  Discharge Condition: Stable CODE STATUS: Full code Diet recommendation: Low-salt diet.  Discharge summary: 71 year old gentleman with advanced prostate cancer currently on antiandrogenic therapy, history of pulmonary embolism recently treated for 6 months and came off of the Eliquis in January 2023 presented with about 3 days of right-sided pleuritic chest pain, worsening for the last 1 day.  In the emergency room blood pressures were more than 811 systolic.  CT angiogram of the chest showed right-sided pulmonary embolism, acute and also some chronic blood clot.  No right heart strain.  Hemodynamically otherwise stable.  On room air.  Was kept in the hospital for observation, started on heparin infusion.  Patient hemodynamically stable.  Mild to moderate clot burden with no right heart strain.  Previously taken Eliquis.  His thromboembolism is probably due to prostate cancer and will benefit with lifelong anticoagulation.  Patient feels comfortable going back on Eliquis, loading dose for 7 days and will maintain next dose.  He will follow-up with his oncologist for further recommendations and ongoing monitoring of medications.  He does not have a history of hypertension, blood pressures sporadically recorded more than 180.  Discussed about dietary modification.  Discussed about treatment plan, patient is not willing to go on any antihypertensives at this time, rather he will check his blood pressures at home and keep a logbook and bring it to doctors office for  follow-up.   Discharge Diagnoses:  Principal Problem:   Pulmonary embolism (HCC) Active Problems:   Elevated blood pressure reading   Malignant neoplasm of prostate (HCC)   HLD (hyperlipidemia)    Discharge Instructions  Discharge Instructions     Call MD for:  difficulty breathing, headache or visual disturbances   Complete by: As directed    Call MD for:  extreme fatigue   Complete by: As directed    Call MD for:  persistant dizziness or light-headedness   Complete by: As directed    Call MD for:  severe uncontrolled pain   Complete by: As directed    Diet - low sodium heart healthy   Complete by: As directed    Increase activity slowly   Complete by: As directed       Allergies as of 02/12/2022       Reactions   Bactrim [sulfamethoxazole-trimethoprim] Hives   Red rash, hives   Hydrocodone    Percocet [oxycodone-acetaminophen] Other (See Comments)   TURNS RED FROM CHEST UP, LOOKS LIKE SUNBURN AND STARTS TO [place] BADLY   Vancomycin    Penicillins Swelling, Rash   Did it involve swelling of the face/tongue/throat, SOB, or low BP? No Did it involve sudden or severe rash/hives, skin peeling, or any reaction on the inside of your mouth or nose? No Did you need to seek medical attention at a hospital or doctor's office? No When did it last happen?      50 years ago If all above answers are NO, may proceed with cephalosporin use.   Zithromax [azithromycin] Rash        Medication List     STOP taking these medications    Eliquis  5 MG Tabs tablet Generic drug: apixaban Replaced by: Apixaban Starter Pack ('10mg'$  and '5mg'$ )       TAKE these medications    abiraterone acetate 250 MG tablet Commonly known as: Zytiga Take 4 tablets (1,000 mg total) by mouth daily. Take on an empty stomach 1 hour before or 2 hours after a meal   Apixaban Starter Pack ('10mg'$  and '5mg'$ ) Commonly known as: ELIQUIS STARTER PACK Take as directed on package: start with two-'5mg'$  tablets  twice daily for 7 days. On day 8, switch to one-'5mg'$  tablet twice daily. Replaces: Eliquis 5 MG Tabs tablet   atorvastatin 20 MG tablet Commonly known as: LIPITOR Take 1 tablet (20 mg total) by mouth daily.   CITRACAL SLOW RELEASE PO Take 2 tablets by mouth daily.   Eligard 30 MG injection Generic drug: Leuprolide Acetate (4 Month) Inject 30 mg into the skin every 4 (four) months.   predniSONE 5 MG tablet Commonly known as: DELTASONE TAKE ONE TABLET BY MOUTH DAILY WITH BREAKFAST   venlafaxine XR 150 MG 24 hr capsule Commonly known as: EFFEXOR-XR Take 1 capsule (150 mg total) by mouth daily with breakfast.        Allergies  Allergen Reactions   Bactrim [Sulfamethoxazole-Trimethoprim] Hives    Red rash, hives   Hydrocodone    Percocet [Oxycodone-Acetaminophen] Other (See Comments)    TURNS RED FROM CHEST UP, LOOKS LIKE SUNBURN AND STARTS TO ITCH BADLY   Vancomycin    Penicillins Swelling and Rash    Did it involve swelling of the face/tongue/throat, SOB, or low BP? No Did it involve sudden or severe rash/hives, skin peeling, or any reaction on the inside of your mouth or nose? No Did you need to seek medical attention at a hospital or doctor's office? No When did it last happen?      50 years ago If all above answers are NO, may proceed with cephalosporin use.    Zithromax [Azithromycin] Rash    Consultations: None   Procedures/Studies: CT Head Wo Contrast  Result Date: 02/11/2022 CLINICAL DATA:  Headache EXAM: CT HEAD WITHOUT CONTRAST TECHNIQUE: Contiguous axial images were obtained from the base of the skull through the vertex without intravenous contrast. RADIATION DOSE REDUCTION: This exam was performed according to the departmental dose-optimization program which includes automated exposure control, adjustment of the mA and/or kV according to patient size and/or use of iterative reconstruction technique. COMPARISON:  CT brain 09/05/2005 FINDINGS: Brain: No acute  territorial infarction, hemorrhage or intracranial mass. Minimal white matter hypodensity consistent with chronic small vessel ischemic change. The ventricles are nonenlarged Vascular: No hyperdense vessels.  Carotid vascular calcification Skull: Normal. Negative for fracture or focal lesion. Sinuses/Orbits: Mild mucosal thickening in the sinuses Other: None IMPRESSION: 1. No CT evidence for acute intracranial abnormality. 2. Mild chronic small vessel ischemic changes of the white matter Electronically Signed   By: Donavan Foil M.D.   On: 02/11/2022 23:19   CT Angio Chest PE W and/or Wo Contrast  Result Date: 02/11/2022 CLINICAL DATA:  Pulmonary embolism (PE) suspected, high prob Right-sided chest and abdominal pain. EXAM: CT ANGIOGRAPHY CHEST WITH CONTRAST TECHNIQUE: Multidetector CT imaging of the chest was performed using the standard protocol during bolus administration of intravenous contrast. Multiplanar CT image reconstructions and MIPs were obtained to evaluate the vascular anatomy. RADIATION DOSE REDUCTION: This exam was performed according to the departmental dose-optimization program which includes automated exposure control, adjustment of the mA and/or kV according to patient size and/or use  of iterative reconstruction technique. CONTRAST:  23m OMNIPAQUE IOHEXOL 350 MG/ML SOLN COMPARISON:  Chest radiograph 06/25/2021, chest CTA 06/23/2021, no interval chest imaging available. FINDINGS: Cardiovascular: There are filling defects within the right upper lobar, right middle segmental, and segmental and subsegmental branches of the right lower lobe. This is in a similar distribution to prior pulmonary emboli, and at least some of this clot appears chronic and adherent to the vessel wall. The lateral right lower lobe clot may be new from prior exam. Overall thromboembolic burden is small. There are no left-sided pulmonary emboli. There is no evidence of right heart strain. Mild aortic tortuosity and  atherosclerosis. Coronary artery calcifications. No pericardial effusion. Mediastinum/Nodes: No mediastinal or hilar adenopathy. No thyroid nodule. Small hiatal hernia. Otherwise decompressed esophagus. Lungs/Pleura: No pulmonary infarct. Subpleural right lower lobe pulmonary nodule measures 5 mm, series 6, image 87, smaller than on prior when it measured 6 mm, although subjectively decreased in density. No confluent airspace disease. No pleural effusion. No features of pulmonary edema. The trachea and central bronchi are patent. Upper Abdomen: No acute upper abdominal findings. Musculoskeletal: Thoracic spondylosis. There are no acute or suspicious osseous abnormalities. Bilateral gynecomastia. Remote healed left anterior rib fracture. Review of the MIP images confirms the above findings. IMPRESSION: 1. Right-sided pulmonary emboli. Some of this clot is chronic when compared to prior exam. The lateral right lower lobe clot is likely new from prior exam. Overall thromboembolic burden is small. No evidence of right heart strain. 2. No pulmonary infarct or acute. 3. Right lower lobe pulmonary nodule is 5 mm, smaller than on prior when it measured 6 mm. This favors a benign etiology. Aortic Atherosclerosis (ICD10-I70.0). Critical Value/emergent results were called by telephone at the time of interpretation on 02/11/2022 at 11:21 pm [facility] who verbally acknowledged these results. Electronically Signed   By: MKeith RakeM.D.   On: 02/11/2022 23:23   (Echo, Carotid, EGD, Colonoscopy, ERCP)    Subjective: Patient seen and examined.  He still in the emergency room.  Mild headache present.  Denies any shortness of breath.  Gets mild to moderate right-sided pain on deep breathing but better than yesterday.  Discussed in detail about management of blood clot.  Comfortable going back on Eliquis. Received first dose of Eliquis in the hospital and discharged home.   Discharge Exam: Vitals:    02/12/22 0715 02/12/22 0730  BP:  (!) 178/87  Pulse: (!) 57 (!) 55  Resp: 13 12  Temp:    SpO2: 99% 99%   Vitals:   02/12/22 0700 02/12/22 0706 02/12/22 0715 02/12/22 0730  BP:    (!) 178/87  Pulse: 62 (!) 56 (!) 57 (!) 55  Resp: '14 15 13 12  '$ Temp:      TempSrc:      SpO2: 100% 100% 99% 99%  Weight:      Height:        General: Pt is alert, awake, not in acute distress On room air.   Cardiovascular: RRR, S1/S2 +, no rubs, no gallops Respiratory: CTA bilaterally, no wheezing, no rhonchi Abdominal: Soft, NT, ND, bowel sounds + Extremities: no edema, no cyanosis    The results of significant diagnostics from this hospitalization (including imaging, microbiology, ancillary and laboratory) are listed below for reference.     Microbiology: Recent Results (from the past 240 hour(s))  Resp Panel by RT-PCR (Flu A&B, Covid) Nasopharyngeal Swab     Status: None   Collection Time: 02/11/22  11:33 PM   Specimen: Nasopharyngeal Swab; Nasopharyngeal(NP) swabs in vial transport medium  Result Value Ref Range Status   SARS Coronavirus 2 by RT PCR NEGATIVE NEGATIVE Final    Comment: (NOTE) SARS-CoV-2 target nucleic acids are NOT DETECTED.  The SARS-CoV-2 RNA is generally detectable in upper respiratory specimens during the acute phase of infection. The lowest concentration of SARS-CoV-2 viral copies this assay can detect is 138 copies/mL. A negative result does not preclude SARS-Cov-2 infection and should not be used as the sole basis for treatment or other patient management decisions. A negative result may occur with  improper specimen collection/handling, submission of specimen other than nasopharyngeal swab, presence of viral mutation(s) within the areas targeted by this assay, and inadequate number of viral copies(<138 copies/mL). A negative result must be combined with clinical observations, patient history, and epidemiological information. The expected result is  Negative.  Fact Sheet for Patients:  EntrepreneurPulse.com.au  Fact Sheet for Healthcare Providers:  IncredibleEmployment.be  This test is no t yet approved or cleared by the [place] FDA and  has been authorized for detection and/or diagnosis of SARS-CoV-2 by FDA under an Emergency Use Authorization (EUA). This EUA will remain  in effect (meaning this test can be used) for the duration of the COVID-19 declaration under Section 564(b)(1) of the Act, 21 U.S.C.section 360bbb-3(b)(1), unless the authorization is terminated  or revoked sooner.       Influenza A by PCR NEGATIVE NEGATIVE Final   Influenza B by PCR NEGATIVE NEGATIVE Final    Comment: (NOTE) The Xpert Xpress SARS-CoV-2/FLU/RSV plus assay is intended as an aid in the diagnosis of influenza from Nasopharyngeal swab specimens and should not be used as a sole basis for treatment. Nasal washings and aspirates are unacceptable for Xpert Xpress SARS-CoV-2/FLU/RSV testing.  Fact Sheet for Patients: EntrepreneurPulse.com.au  Fact Sheet for Healthcare Providers: IncredibleEmployment.be  This test is not yet approved or cleared by the [place] FDA and has been authorized for detection and/or diagnosis of SARS-CoV-2 by FDA under an Emergency Use Authorization (EUA). This EUA will remain in effect (meaning this test can be used) for the duration of the COVID-19 declaration under Section 564(b)(1) of the Act, 21 U.S.C. section 360bbb-3(b)(1), unless the authorization is terminated or revoked.  Performed at [facility], [place] [address], [place], [place] [address]      Labs: BNP (last 3 results) No results for input(s): BNP in the last 8760 hours. Basic Metabolic Panel: Recent Labs  Lab 02/11/22 1639 02/12/22 0805  NA 137 136  K 4.3 3.7  CL 102 103  CO2 26 23  GLUCOSE 94 109*  BUN 17 16  CREATININE 1.15 1.16  CALCIUM 10.4* 9.3    Liver Function Tests: Recent Labs  Lab 02/12/22 0805  AST 26  ALT 23  ALKPHOS 44  BILITOT 0.7  PROT 6.6  ALBUMIN 3.8   No results for input(s): LIPASE, AMYLASE in the last 168 hours. No results for input(s): AMMONIA in the last 168 hours. CBC: Recent Labs  Lab 02/11/22 1639 02/12/22 0356  WBC 8.8 8.4  NEUTROABS 6.4  --   HGB 14.2 12.8*  HCT 43.1 37.5*  MCV 98.4 96.2  PLT 238 212   Cardiac Enzymes: No results for input(s): CKTOTAL, CKMB, CKMBINDEX, TROPONINI in the last 168 hours. BNP: Invalid input(s): POCBNP CBG: No results for input(s): GLUCAP in the last 168 hours. D-Dimer No results for input(s): DDIMER in the last 72 hours. Hgb A1c No  results for input(s): HGBA1C in the last 72 hours. Lipid Profile No results for input(s): CHOL, HDL, LDLCALC, TRIG, CHOLHDL, LDLDIRECT in the last 72 hours. Thyroid function studies No results for input(s): TSH, T4TOTAL, T3FREE, THYROIDAB in the last 72 hours.  Invalid input(s): FREET3 Anemia work up No results for input(s): VITAMINB12, FOLATE, FERRITIN, TIBC, IRON, RETICCTPCT in the last 72 hours. Urinalysis    Component Value Date/Time   COLORURINE YELLOW 08/06/2021 1630   APPEARANCEUR CLEAR 08/06/2021 1630   LABSPEC 1.015 08/06/2021 1630   PHURINE 7.5 08/06/2021 1630   GLUCOSEU NEGATIVE 08/06/2021 1630   HGBUR LARGE (A) 08/06/2021 1630   BILIRUBINUR NEGATIVE 08/06/2021 1630   BILIRUBINUR negative 08/06/2021 1156   KETONESUR NEGATIVE 08/06/2021 1630   PROTEINUR Negative 08/06/2021 1156   PROTEINUR NEGATIVE 06/23/2021 0956   UROBILINOGEN 0.2 08/06/2021 1630   NITRITE NEGATIVE 08/06/2021 1630   LEUKOCYTESUR NEGATIVE 08/06/2021 1630   Sepsis Labs Invalid input(s): PROCALCITONIN,  WBC,  LACTICIDVEN Microbiology Recent Results (from the past 240 hour(s))  Resp Panel by RT-PCR (Flu A&B, Covid) Nasopharyngeal Swab     Status: None   Collection Time: 02/11/22 11:33 PM   Specimen: Nasopharyngeal Swab;  Nasopharyngeal(NP) swabs in vial transport medium  Result Value Ref Range Status   SARS Coronavirus 2 by RT PCR NEGATIVE NEGATIVE Final    Comment: (NOTE) SARS-CoV-2 target nucleic acids are NOT DETECTED.  The SARS-CoV-2 RNA is generally detectable in upper respiratory specimens during the acute phase of infection. The lowest concentration of SARS-CoV-2 viral copies this assay can detect is 138 copies/mL. A negative result does not preclude SARS-Cov-2 infection and should not be used as the sole basis for treatment or other patient management decisions. A negative result may occur with  improper specimen collection/handling, submission of specimen other than nasopharyngeal swab, presence of viral mutation(s) within the areas targeted by this assay, and inadequate number of viral copies(<138 copies/mL). A negative result must be combined with clinical observations, patient history, and epidemiological information. The expected result is Negative.  Fact Sheet for Patients:  EntrepreneurPulse.com.au  Fact Sheet for Healthcare Providers:  IncredibleEmployment.be  This test is no t yet approved or cleared by the [place] FDA and  has been authorized for detection and/or diagnosis of SARS-CoV-2 by FDA under an Emergency Use Authorization (EUA). This EUA will remain  in effect (meaning this test can be used) for the duration of the COVID-19 declaration under Section 564(b)(1) of the Act, 21 U.S.C.section 360bbb-3(b)(1), unless the authorization is terminated  or revoked sooner.       Influenza A by PCR NEGATIVE NEGATIVE Final   Influenza B by PCR NEGATIVE NEGATIVE Final    Comment: (NOTE) The Xpert Xpress SARS-CoV-2/FLU/RSV plus assay is intended as an aid in the diagnosis of influenza from Nasopharyngeal swab specimens and should not be used as a sole basis for treatment. Nasal washings and aspirates are unacceptable for Xpert Xpress  SARS-CoV-2/FLU/RSV testing.  Fact Sheet for Patients: EntrepreneurPulse.com.au  Fact Sheet for Healthcare Providers: IncredibleEmployment.be  This test is not yet approved or cleared by the [place] FDA and has been authorized for detection and/or diagnosis of SARS-CoV-2 by FDA under an Emergency Use Authorization (EUA). This EUA will remain in effect (meaning this test can be used) for the duration of the COVID-19 declaration under Section 564(b)(1) of the Act, 21 U.S.C. section 360bbb-3(b)(1), unless the authorization is terminated or revoked.  Performed at [facility], [place] [address] [place], [place] [address]  Time coordinating discharge: 30 minutes  SIGNED:   [name], MD  [facility] 02/12/2022, 9:39 AM

## 2022-02-12 NOTE — Assessment & Plan Note (Signed)
Systolic up to 848 in ED. Improved after total of '20mg'$  IV labetalol in ED. ?-continue to monitor and have PRN hydralazine for BP 180/110  ?

## 2022-02-15 ENCOUNTER — Telehealth: Payer: Self-pay | Admitting: Family Medicine

## 2022-02-15 ENCOUNTER — Ambulatory Visit: Payer: PPO | Admitting: Family Medicine

## 2022-02-15 MED ORDER — AMLODIPINE BESYLATE 5 MG PO TABS: 5.0000 mg | ORAL_TABLET | Freq: Every day | ORAL | 3 refills | Status: DC

## 2022-02-15 NOTE — Telephone Encounter (Signed)
Left message on voicemail for patient to call the office back. 

## 2022-02-15 NOTE — Telephone Encounter (Signed)
Plz notify I have sent amlodipine '5mg'$  to his pharmacy to start taking. May take first dose today, then take in the mornings, may be administered without regard to food.  ?Will see him tomorrow afternoon.  ?

## 2022-02-15 NOTE — Telephone Encounter (Signed)
Patient's wife notified as instructed by telephone. Patient's wife and was advised that she thinks that he was really stressed out this morning when he got the high reading.Patient's wife stated that he has gotten high readings in the past few days. Patient's wife stated that she checked his blood pressure at 11:00 and it was down to 164/84 and then at 2:00 it was 146/70. Patient's wife stated that he has been stressed out about the dreams that he is having. Patient's wife wants to make sure that he still needs to take the Amlodipine since his blood pressure has come down. ?

## 2022-02-15 NOTE — Telephone Encounter (Signed)
After speaking with Dr. Danise Mina patient's wife was called and advised to go ahead and pick the prescriptions up today. Patient's wife was advised if his blood pressure stays down he should be fine not taking the Amlodipine tonight unless it goes back up again. Patient's wife was advised if his blood pressure is up in the morning to go ahead and give him the Amlodipine. Patient's wife stated that she will continue to monitor his blood pressure and will give him the Amlodipine if it goes up again tonight. ?

## 2022-02-15 NOTE — Telephone Encounter (Signed)
Pt called stating that  he was released from the hospital on 02/12/22 with blood clots. Pt also states that he is having issues with his medication and that his blood pressure has been 210/95 and 215/95. I transferred pt to access nurse. Pt would like a call back to discuss. Please advise. ?

## 2022-02-15 NOTE — Telephone Encounter (Addendum)
Shane [name] with Access nurse called and said she had talked with pt who had elevated BP and vivid dreams and was recently d/c from [name] with PE and access advised pt to go to ED. Pt does not want to go back to ED. I called pt and pt said his BP was up throughout his [name] stay and pt said that while in [name] pt was not given any oral med for BP; when I reviewed with pt about not being on BP med while in [name] he said he remembered they put pt on IV med to help get BP down but BP still stayed high while in [name]. Pt was d/c from [name] on 02/12/22 on eliquis. Pt said he has not taken any new oral meds recently except eliquis; pt has been on prednisone for a while. Pt also for 1 wk having vivid dreams that when pt wakes up pt thinks the dream continues in real life and the content of the dreams are from years ago. Pt said also his personality has changed where he is verbally abusive. Pt is not physically abusive and pt said totally out of his character to be verball abusive. Pt does not have H/A,dizziness or SOB. Pt still has rt sided CP and pt thinks due to PE. Pt recked BP now 168/84 P?. Shane [name] said would see pt on 02/16/22 at 12:30 at [name]. Shane [name] front office mgr will schedule 02/16/22 at 12:30 appt with [name].   Pt said he would be willing to start a BP med Shane [name]. Pt said he would continue to monitor BP at home and is going to try to rest.UC & ED precautions given and pt voiced understanding. Sending note to Shane [name] and Shane [name] CMA.when Access nurse note is available will add to this note. ? ? ? ?Shane [name] - Client ?TELEPHONE ADVICE RECORD ?AccessNurse? ?Patient ?Name: ?Shane MCC ?[name] ?Gender: Male ?DOB: 08-01-1951 ?Age: 71 Y 60 M 13 D ?Return ?Phone ?Number: ?[id number] ?(Primary), ?[id number] ?(Secondary) ?Address: ?City/ ?State/ ?Zip: ?[place] ? [address] ?Client [place] [name] - Client ?Client Site [name] -  [name] ?Provider Shane [name] - MD ?Contact Type Call ?Who Is Calling Patient / Member / Family / Caregiver ?Call Type Triage / Clinical ?Relationship To Patient Self ?Return Phone Number [phone] (Primary) ?Chief Complaint BLOOD PRESSURE HIGH - Systolic (top ?number) [phone] or [phone] ?Reason for Call Symptomatic / Request for Health Information ?Initial Comment Caller states, patient out of [name]. Blood clots. ?BP is 210/95, 215/95 and 100. ?Translation No ?Nurse Assessment ?Nurse: Shane Plumber, RN, Shane [name] Date/Time (Eastern Time): 02/15/2022 9:59:00 AM ?Confirm and document reason for call. If ?symptomatic, describe symptoms. ?---caller states he discharged from the [name] friday ?with PEs says he feels aggressive to the point his wife ?and kids try to stay away. says he is concerned it is his ?meds. now high blood pressure. this morning 205/105 ?feels like his memory is gone. ?Does the patient have any new or worsening ?symptoms? ---Yes ?Will a triage be completed? ---Yes ?Related visit to physician within the last 2 weeks? ---Yes ?Does the PT have any chronic conditions? (i.e. ?diabetes, asthma, this includes High risk factors for ?pregnancy, etc.) ?---Yes ?List chronic conditions. ---HTN ?Is this a behavioral health or substance abuse call? ---No ?Guidelines ?Guideline Title Affirmed Question Affirmed Notes Nurse Date/Time (Eastern ?Time) ?Blood Pressure - ?High ?[0] Systolic BP >= ?626 OR Diastolic ?>= 948 AND [5] ?  cardiac or neurologic ?symptoms (e.g., ?chest pain, difficulty ?breathing, unsteady ?gait, blurred vision) ?Humfleet, RN, ?Shane [name] ?02/15/2022 10:00:59 ?AM ?PLEASE NOTE: All timestamps contained within this report are represented as [place] Standard Time. ?CONFIDENTIALTY NOTICE: This fax transmission is intended only for the addressee. It contains information that is legally privileged, confidential or ?otherwise protected from use or disclosure. If you are not the intended recipient, you are  strictly prohibited from reviewing, disclosing, copying using ?or disseminating any of this information or taking any action in reliance on or regarding this information. If you have received this fax in error, please ?notify us immediately by telephone so that we can arrange for its return to [place]. Phone: [phone], Toll-Free: [phone], Fax: [phone] ?Page: 2 of 2 ?Call Id: [id number] ?Disp. Time (Eastern ?Time) Disposition Final User ?02/15/2022 9:55:37 AM Send to Urgent Shane [name], Shane [name] ?02/15/2022 10:09:26 AM Go to ED Now Yes Humfleet, RN, Shane [name] ?Caller Disagree/Comply Disagree ?Caller Understands Yes ?PreDisposition Did not know what to do ?Care Advice Given Per Guideline ?GO TO ED NOW: * You need to be seen in the Emergency Department. * Go to the ED at ___________ [place] now. ?Drive carefully. CARE ADVICE given per High Blood Pressure (Adult) guideline. ?Comments ?User: Shane Logan, RN Date/Time Eilene Ghazi Time): 02/15/2022 10:07:19 AM ?prednisone, zytega, atorvastatin, eliquis, calcium supplement wife states her husband is on edge and that is not like ?him at all. 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 ?User: Shane Logan, RN Date/Time Eilene Ghazi Time): 02/15/2022 10:16:25 AM ?called back line and spoke with Renna, nurse. relayed information and call back # ?Referrals ?GO TO FACILITY REFUSE ?

## 2022-02-16 ENCOUNTER — Encounter: Payer: Self-pay | Admitting: Family Medicine

## 2022-02-16 ENCOUNTER — Other Ambulatory Visit: Payer: Self-pay

## 2022-02-16 ENCOUNTER — Ambulatory Visit (INDEPENDENT_AMBULATORY_CARE_PROVIDER_SITE_OTHER): Payer: PPO | Admitting: Family Medicine

## 2022-02-16 VITALS — BP 162/88 | HR 69 | Temp 97.4°F | Ht 72.0 in | Wt 200.4 lb

## 2022-02-16 DIAGNOSIS — I2699 Other pulmonary embolism without acute cor pulmonale: Secondary | ICD-10-CM | POA: Diagnosis not present

## 2022-02-16 DIAGNOSIS — Z86711 Personal history of pulmonary embolism: Secondary | ICD-10-CM

## 2022-02-16 DIAGNOSIS — F1994 Other psychoactive substance use, unspecified with psychoactive substance-induced mood disorder: Secondary | ICD-10-CM | POA: Insufficient documentation

## 2022-02-16 DIAGNOSIS — E7800 Pure hypercholesterolemia, unspecified: Secondary | ICD-10-CM | POA: Diagnosis not present

## 2022-02-16 DIAGNOSIS — I10 Essential (primary) hypertension: Secondary | ICD-10-CM | POA: Diagnosis not present

## 2022-02-16 DIAGNOSIS — I700 Atherosclerosis of aorta: Secondary | ICD-10-CM | POA: Diagnosis not present

## 2022-02-16 DIAGNOSIS — C61 Malignant neoplasm of prostate: Secondary | ICD-10-CM | POA: Diagnosis not present

## 2022-02-16 DIAGNOSIS — R6889 Other general symptoms and signs: Secondary | ICD-10-CM

## 2022-02-16 MED ORDER — ATORVASTATIN CALCIUM 10 MG PO TABS: 10.0000 mg | ORAL_TABLET | Freq: Every day | ORAL | 6 refills | Status: DC

## 2022-02-16 MED ORDER — VENLAFAXINE HCL ER 75 MG PO CP24: 75.0000 mg | ORAL_CAPSULE | Freq: Every day | ORAL | 3 refills | Status: DC

## 2022-02-16 NOTE — Assessment & Plan Note (Signed)
On zytiga, eligard, prednisone '5mg'$  daily. Appreciate onc care.  ?

## 2022-02-16 NOTE — Patient Instructions (Addendum)
Increase water intake. ?Start amlodipine '5mg'$  daily for blood pressure control, continue to monitor blood pressures. If blood pressures start dropping <120/60, ok to hold amlodipine. ?Let's drop venlafaxine XR to '75mg'$  daily. ?Continue lifelong eliquis.  ?Ok to drop atorvastatin to '10mg'$  daily  ?

## 2022-02-16 NOTE — Assessment & Plan Note (Signed)
Acute on chronic R sided pulm embolism without right heart strain. Now on eliquis, currently on loading dose. Notes ongoing R sided chest discomfort which anticipate will continue to improve. Will need lifelong anticoagulation.  ?

## 2022-02-16 NOTE — Progress Notes (Signed)
? ? Patient ID: Shane [name], male    DOB: 1951/06/29, 71 y.o.   MRN: [id number] ? ?This visit was conducted in person. ? ?BP (!) 162/88 (BP Location: Right Arm, Cuff Size: Normal)   Pulse 69   Temp (!) 97.4 ?F (36.3 ?C) (Temporal)   Ht 6' (1.829 m)   Wt 200 lb 6 oz (90.9 kg)   SpO2 97%   BMI 27.18 kg/m?   ?BP Readings from Last 3 Encounters:  ?02/16/22 (!) 162/88  ?02/12/22 (!) 167/83  ?12/31/21 (!) 156/72  ? ?CC: hospital f/u visit  ?Subjective:  ? ?HPI: ?Shane [name] is a 71 y.o. male presenting on 02/16/2022 for Hospitalization Follow-up (Seen on 02/11/22 at [facility], dx other acute PE without acute cor pulmonale.  Pt accompanied by wife, Shane [name]. ) ? ? ?Recent hospitalization for several days of R chest pain similar to prior PE, CTA chest showed acute on chronic R sided pulmonary embolism, with no evidence of right heart strain. Initially treated with IV heparin infusion, transitioned to oral eliquis (loading dose '10mg'$  bid for the first week). Also found to have markedly elevated blood pressure readings to 829H systolic, treated with IV labetalol x2.  ?Recommend lifelong anticoagulation.  ?Hospital records reviewed. Med rec performed.  ? ?Previously on eliquis for prior PE, this was stopped 12/2021.  ?He is on eliquis XR '150mg'$  daily for hot flashes likely due to Eligard.  ? ?Home health not set up.  ?Other follow up appointments scheduled: none. Next appt with onc Alen Blew) is 04/29/2022.  ? ?This past week he's noted some personality changes (more argumentative) associated with vivid dreams.  ?______________________________________________________________________ ?Hospital admission: 02/11/2022 ?Hospital discharge: 02/12/2022 ?TCM f/u phone call: not performed but seen within 2 business days of hospital discharge ? ?Admitted From: Home ?Disposition: Home ?  ?Recommendations for Outpatient Follow-up:  ?Follow up with PCP in 1-2 weeks ?Follow-up with oncology as scheduled. ?Continue to take Eliquis uninterrupted as  you were doing in the past. ?  ?Discharge Diagnoses:  ?Principal Problem: ?  Pulmonary embolism ([place]) ?Active Problems: ?  Elevated blood pressure reading ?  Malignant neoplasm of prostate ([place]) ?  HLD (hyperlipidemia) ? ?Home Health: N/A ?Equipment/Devices: N/A ?  ?Discharge Condition: Stable ?CODE STATUS: Full code ?Diet recommendation: Low-salt diet. ?   ? ?Relevant past medical, surgical, family and social history reviewed and updated as indicated. Interim medical history since our last visit reviewed. ?Allergies and medications reviewed and updated. ?Outpatient Medications Prior to Visit  ?Medication Sig Dispense Refill  ? abiraterone acetate (ZYTIGA) 250 MG tablet Take 4 tablets (1,000 mg total) by mouth daily. Take on an empty stomach 1 hour before or 2 hours after a meal 120 tablet 10  ? APIXABAN (ELIQUIS) VTE STARTER PACK ('10MG'$  AND '5MG'$ ) Take as directed on package: start with two-'5mg'$  tablets twice daily for 7 days. On day 8, switch to one-'5mg'$  tablet twice daily. 1 each 0  ? Calcium-Magnesium-Vitamin D (CITRACAL SLOW RELEASE PO) Take 1 tablet by mouth daily.    ? Leuprolide Acetate, 4 Month, (ELIGARD) 30 MG injection Inject 30 mg into the skin every 4 (four) months.    ? predniSONE (DELTASONE) 5 MG tablet TAKE ONE TABLET BY MOUTH DAILY WITH BREAKFAST (Patient taking differently: Take 5 mg by mouth daily with breakfast.) 90 tablet 0  ? atorvastatin (LIPITOR) 20 MG tablet Take 1 tablet (20 mg total) by mouth daily. 90 tablet 3  ? venlafaxine XR (EFFEXOR-XR) 150 MG 24 hr capsule Take 1  capsule (150 mg total) by mouth daily with breakfast. 30 capsule 6  ? amLODipine (NORVASC) 5 MG tablet Take 1 tablet (5 mg total) by mouth daily. (Patient not taking: Reported on 02/16/2022) 30 tablet 3  ? ?No facility-administered medications prior to visit.  ?  ? ?Per HPI unless specifically indicated in ROS section below ?Review of Systems ? ?Objective:  ?BP (!) 162/88 (BP Location: Right Arm, Cuff Size: Normal)   Pulse 69    Temp (!) 97.4 ?F (36.3 ?C) (Temporal)   Ht 6' (1.829 m)   Wt 200 lb 6 oz (90.9 kg)   SpO2 97%   BMI 27.18 kg/m?   ?Wt Readings from Last 3 Encounters:  ?02/16/22 200 lb 6 oz (90.9 kg)  ?02/11/22 204 lb 5.9 oz (92.7 kg)  ?12/31/21 204 lb 4.8 oz (92.7 kg)  ?  ?  ?Physical Exam ?Vitals and nursing note reviewed.  ?Constitutional:   ?   Appearance: Normal appearance. He is not ill-appearing.  ?Eyes:  ?   Extraocular Movements: Extraocular movements intact.  ?   Pupils: Pupils are equal, round, and reactive to light.  ?Cardiovascular:  ?   Rate and Rhythm: Normal rate and regular rhythm.  ?   Pulses: Normal pulses.  ?   Heart sounds: Normal heart sounds. No murmur heard. ?Pulmonary:  ?   Effort: Pulmonary effort is normal. No respiratory distress.  ?   Breath sounds: Normal breath sounds. No wheezing, rhonchi or rales.  ?Musculoskeletal:  ?   Right lower leg: No edema.  ?   Left lower leg: No edema.  ?Skin: ?   General: Skin is warm and dry.  ?   Findings: No rash.  ?Neurological:  ?   Mental Status: He is alert.  ?Psychiatric:     ?   Mood and Affect: Mood normal.     ?   Behavior: Behavior normal.  ? ?   ?Results for orders placed or performed during the hospital encounter of 02/11/22  ?Resp Panel by RT-PCR (Flu A&B, Covid) Nasopharyngeal Swab  ? Specimen: Nasopharyngeal Swab; Nasopharyngeal(NP) swabs in vial transport medium  ?Result Value Ref Range  ? SARS Coronavirus 2 by RT PCR NEGATIVE NEGATIVE  ? Influenza A by PCR NEGATIVE NEGATIVE  ? Influenza B by PCR NEGATIVE NEGATIVE  ?Basic metabolic panel  ?Result Value Ref Range  ? Sodium 137 135 - 145 mmol/L  ? Potassium 4.3 3.5 - 5.1 mmol/L  ? Chloride 102 98 - 111 mmol/L  ? CO2 26 22 - 32 mmol/L  ? Glucose, Bld 94 70 - 99 mg/dL  ? BUN 17 8 - 23 mg/dL  ? Creatinine, Ser 1.15 0.61 - 1.24 mg/dL  ? Calcium 10.4 (H) 8.9 - 10.3 mg/dL  ? GFR, Estimated >60 >60 mL/min  ? Anion gap 9 5 - 15  ?CBC with Differential  ?Result Value Ref Range  ? WBC 8.8 4.0 - 10.5 K/uL  ? RBC  4.38 4.22 - 5.81 MIL/uL  ? Hemoglobin 14.2 13.0 - 17.0 g/dL  ? HCT 43.1 39.0 - 52.0 %  ? MCV 98.4 80.0 - 100.0 fL  ? MCH 32.4 26.0 - 34.0 pg  ? MCHC 32.9 30.0 - 36.0 g/dL  ? RDW 12.0 11.5 - 15.5 %  ? Platelets 238 150 - 400 K/uL  ? nRBC 0.0 0.0 - 0.2 %  ? Neutrophils Relative % 71 %  ? Neutro Abs 6.4 1.7 - 7.7 K/uL  ? Lymphocytes Relative 17 %  ?  Lymphs Abs 1.5 0.7 - 4.0 K/uL  ? Monocytes Relative 7 %  ? Monocytes Absolute 0.6 0.1 - 1.0 K/uL  ? Eosinophils Relative 3 %  ? Eosinophils Absolute 0.2 0.0 - 0.5 K/uL  ? Basophils Relative 1 %  ? Basophils Absolute 0.1 0.0 - 0.1 K/uL  ? Immature Granulocytes 1 %  ? Abs Immature Granulocytes 0.06 0.00 - 0.07 K/uL  ?CBC  ?Result Value Ref Range  ? WBC 8.4 4.0 - 10.5 K/uL  ? RBC 3.90 (L) 4.22 - 5.81 MIL/uL  ? Hemoglobin 12.8 (L) 13.0 - 17.0 g/dL  ? HCT 37.5 (L) 39.0 - 52.0 %  ? MCV 96.2 80.0 - 100.0 fL  ? MCH 32.8 26.0 - 34.0 pg  ? MCHC 34.1 30.0 - 36.0 g/dL  ? RDW 12.2 11.5 - 15.5 %  ? Platelets 212 150 - 400 K/uL  ? nRBC 0.0 0.0 - 0.2 %  ?Heparin level (unfractionated)  ?Result Value Ref Range  ? Heparin Unfractionated 0.46 0.30 - 0.70 IU/mL  ?Comprehensive metabolic panel  ?Result Value Ref Range  ? Sodium 136 135 - 145 mmol/L  ? Potassium 3.7 3.5 - 5.1 mmol/L  ? Chloride 103 98 - 111 mmol/L  ? CO2 23 22 - 32 mmol/L  ? Glucose, Bld 109 (H) 70 - 99 mg/dL  ? BUN 16 8 - 23 mg/dL  ? Creatinine, Ser 1.16 0.61 - 1.24 mg/dL  ? Calcium 9.3 8.9 - 10.3 mg/dL  ? Total Protein 6.6 6.5 - 8.1 g/dL  ? Albumin 3.8 3.5 - 5.0 g/dL  ? AST 26 15 - 41 U/L  ? ALT 23 0 - 44 U/L  ? Alkaline Phosphatase 44 38 - 126 U/L  ? Total Bilirubin 0.7 0.3 - 1.2 mg/dL  ? GFR, Estimated >60 >60 mL/min  ? Anion gap 10 5 - 15  ?Troponin I (High Sensitivity)  ?Result Value Ref Range  ? Troponin I (High Sensitivity) 12 <18 ng/L  ?Troponin I (High Sensitivity)  ?Result Value Ref Range  ? Troponin I (High Sensitivity) 14 <18 ng/L  ? ? ?Assessment & Plan:  ?This visit occurred during the SARS-CoV-2 public health  emergency.  Safety protocols were in place, including screening questions prior to the visit, additional usage of staff PPE, and extensive cleaning of exam room while observing appropriate contact time as indicated

## 2022-02-16 NOTE — Assessment & Plan Note (Signed)
Reviewed recent BP readings over last several months - all high. Anticipate multifactorial including possible venlafaxine contribution. Reviewed reasons to treat hypertension. Will drop venlafaxine XR to '75mg'$  daily and start amlodipine '5mg'$  daily, reviewed side effects to monitor for. Update with effect.  ?

## 2022-02-16 NOTE — Assessment & Plan Note (Signed)
Recent vivid dreams with noted mood changes of unclear etiology however this seems to be improving.  Will continue to monitor.  ?

## 2022-02-16 NOTE — Assessment & Plan Note (Signed)
Statin started to lower cardiovascular risk. requests trial lower dose - will send atorvastatin '10mg'$  daily.  ?

## 2022-02-16 NOTE — Assessment & Plan Note (Addendum)
Drop atorva dose to '10mg'$  daily.  ?

## 2022-02-16 NOTE — Assessment & Plan Note (Addendum)
See below.  ?Discussed lifelong anticoagulation.  ?

## 2022-02-23 ENCOUNTER — Other Ambulatory Visit (HOSPITAL_COMMUNITY): Payer: Self-pay

## 2022-02-24 ENCOUNTER — Other Ambulatory Visit (HOSPITAL_COMMUNITY): Payer: Self-pay

## 2022-03-04 ENCOUNTER — Other Ambulatory Visit (HOSPITAL_COMMUNITY): Payer: Self-pay

## 2022-03-15 ENCOUNTER — Telehealth: Payer: Self-pay

## 2022-03-15 NOTE — Telephone Encounter (Signed)
Patient has been sent jury duty summons. They are requesting letter from doctor for him to be excused. He will need letter sent in by 4/24. He will print off my chart but would like call to let him know when ready in my chart.   ?

## 2022-03-19 NOTE — Telephone Encounter (Signed)
Spoke with pt relaying Dr. Synthia Innocent message and notifying him letter is ready.  Pt expresses his thanks and requests letter be mailed. ? ?Mailed letter. ?

## 2022-03-19 NOTE — Telephone Encounter (Addendum)
Letter written, signed and in Shane [name]'s box.  ?I think for jury duty they likely need a signed copy of the letter that's why I didn't send through [place].  ?

## 2022-03-29 ENCOUNTER — Other Ambulatory Visit (HOSPITAL_COMMUNITY): Payer: Self-pay

## 2022-03-30 ENCOUNTER — Telehealth: Payer: Self-pay | Admitting: Family Medicine

## 2022-03-30 NOTE — Telephone Encounter (Signed)
Pt dropped off paperwork for dr g. And gave to [facility] @ front desk and I put in dr g. Box and it's in a envelope ?

## 2022-03-31 ENCOUNTER — Other Ambulatory Visit (HOSPITAL_COMMUNITY): Payer: Self-pay

## 2022-03-31 MED ORDER — HYDROXYZINE HCL 25 MG PO TABS: 12.5000 mg | ORAL_TABLET | Freq: Two times a day (BID) | ORAL | 0 refills | Status: DC | PRN

## 2022-03-31 MED ORDER — DULOXETINE HCL 20 MG PO CPEP: 20.0000 mg | ORAL_CAPSULE | Freq: Every day | ORAL | 0 refills | Status: DC

## 2022-03-31 NOTE — Addendum Note (Signed)
Addended by: [name] on: 03/31/2022 02:32 PM ? ? Modules accepted: Orders ? ?

## 2022-03-31 NOTE — Telephone Encounter (Signed)
Noted. Will follow up with patient next month to reassess how he is doing. ?

## 2022-03-31 NOTE — Telephone Encounter (Addendum)
Received message about concern over venlafaxine use, then possible withdrawal symptoms after recent taper.  ? ?Venlafaxine increased to '150mg'$  ER 11/2021.  ?Venlafaxine ER dropped to '75mg'$  dose 02/2022 partly due to concern over elevated BP readings.  ? ?Venlafaxine further titrated down by urology by '25mg'$ /wk until fully off 03/25/2022.  ? ?Now concern over discontinuation symptoms (dizziness, worsening mood, insomnia, tremor/shaking) - however all these symptoms were present while on venlafaxine.  ? ?Agrees to start low dose cymbalta '20mg'$  x 2 wks as bridge off venlafaxine. Will also Rx hydroxyzine '25mg'$  1/2-1 tab PRN anxiety, sleep.  ? ?Will cc Mendel Ryder as fyi.  ?

## 2022-03-31 NOTE — Telephone Encounter (Signed)
Placed in Dr. G's box.  

## 2022-04-06 NOTE — Telephone Encounter (Addendum)
Called pt for an update - no answer, left message. Will try again later.  ?

## 2022-04-07 NOTE — Telephone Encounter (Signed)
Called, spoke with patient.  ?No improvement on cymbalta.  ?Ongoing irritability, insomnia.  ? ?No fmhx bipolar, depression.  ? ?He has a contact at local psychiatrist office who is going to get him an appointment with psychiatrist for next week. He will let us know if needs assistance/referral on our end.  ?

## 2022-04-10 ENCOUNTER — Other Ambulatory Visit: Payer: Self-pay | Admitting: Family Medicine

## 2022-04-12 ENCOUNTER — Encounter: Payer: Self-pay | Admitting: Family Medicine

## 2022-04-12 ENCOUNTER — Telehealth: Payer: Self-pay

## 2022-04-12 DIAGNOSIS — F29 Unspecified psychosis not due to a substance or known physiological condition: Secondary | ICD-10-CM

## 2022-04-12 DIAGNOSIS — C61 Malignant neoplasm of prostate: Secondary | ICD-10-CM

## 2022-04-12 NOTE — Telephone Encounter (Signed)
Refill request Cymbalta ?Last refill 03/31/22 #14 ?Last office visit 02/16/22 ?See phone note 03/30/22 ?

## 2022-04-12 NOTE — Telephone Encounter (Signed)
Received refill request for Eliquis. Not prescribed by our office in past.  ? ? ?

## 2022-04-13 ENCOUNTER — Telehealth: Payer: Self-pay | Admitting: Family Medicine

## 2022-04-13 MED ORDER — APIXABAN 5 MG PO TABS: 5.0000 mg | ORAL_TABLET | Freq: Two times a day (BID) | ORAL | 6 refills | Status: DC

## 2022-04-13 NOTE — Telephone Encounter (Signed)
Pt wife called to follow up on this, she said that Dr Dawayne Patricia really needs to talk to Dr Danise Mina today and to please call back today(04/13/22) at [phone].  ?

## 2022-04-13 NOTE — Telephone Encounter (Signed)
From my last note: ?Spoke with Dr Dawayne Patricia psychiatrist.  ?She is working on getting him into geriatric psychiatrist.  ?Concern for drug induced hypomania.  ?Trial lunesta to replace sleep cycle which has seemed to be helpful.  ?Acute mental status changes, visual hallucinations, paranoid delusions over remote relationships all seemed to start with high dose venlafaxine and after ER visit for PE - rec brain imaging which I have ordered. ?

## 2022-04-13 NOTE — Telephone Encounter (Signed)
Eliquis ?Last rx: 02/12/22 ?Last OV:  02/16/22, ER f/u ?Next OV: 05/10/22, CPE ?

## 2022-04-13 NOTE — Telephone Encounter (Signed)
ERx 

## 2022-04-13 NOTE — Progress Notes (Signed)
Spoke with Dr Dawayne Patricia psychiatrist.  ?She is working on getting him into geriatric psychiatrist.  ?Concern for drug induced hypomania.  ?Trial lunesta to replace sleep cycle which has seemed to be helpful.  ?Acute mental status changes and visual hallucinations - rec brain imaging.  ?

## 2022-04-13 NOTE — Telephone Encounter (Signed)
Spoke with pt relaying Dr. Synthia Innocent message. Pt is currently outside and not able to write down phn # to schedule MRI. Told pt I will send a MyChart message to include this info.  Pt verbalizes understanding and is headed inside to call now.  ?

## 2022-04-13 NOTE — Telephone Encounter (Signed)
[name] - Client ?Nonclinical Telephone Record  ?AccessNurse? ?Client [place] [facility] - Client ?Client Site [place] ?Provider [name] - MD ?Contact Type Call ?Who Is Calling Patient / Member / Family / Caregiver ?Caller Name frances Storti ?Caller Phone Number [phone] ?Patient Name Shane [name] ?Patient DOB [birth year] ?Call Type Message Only Information Provided ?Reason for Call Request for General Office Information ?Initial Comment Caller states pt/husband would like to have dr call the referral specialist before pt can be ?seen for appt. dr is needing this call ASAP by tomorrow before pt is treated. dr. Cecille Rubin davis ?[phone] ?Disp. Time Disposition Final User ?04/12/2022 5:33:08 PM General Information Provided Yes Paulino Door ?Call Closed By: Paulino Door ?Transaction Date/Time: 04/12/2022 5:23:54 PM (ET ?

## 2022-04-13 NOTE — Telephone Encounter (Signed)
Dr Danise Mina is requesting a STAT MRI Brain be scheduled but the patient has not been made aware of this needing to be done.  ?A Mychart message was sent to the patient but he has not read this yet today.  ? ?I have let Dr Darnell Level and his CMA Lattie Haw know that I am not able to set this up until the patient has been made aware that this is needed.  ? ?No precert is required - the patient can call to schedule once everything has been discussed. Given it is a MRI they need to talk to the patient anyways to pre-screen him.  ?He can call [phone] to schedule. ? ? ?

## 2022-04-13 NOTE — Addendum Note (Signed)
Addended by: [name] on: 04/13/2022 02:00 PM ? ? Modules accepted: Orders ? ?

## 2022-04-14 ENCOUNTER — Ambulatory Visit
Admission: RE | Admit: 2022-04-14 | Discharge: 2022-04-14 | Disposition: A | Discharge status: Home / Self Care | Payer: PPO | Source: Ambulatory Visit | Attending: Family Medicine | Admitting: Family Medicine

## 2022-04-14 DIAGNOSIS — Z8546 Personal history of malignant neoplasm of prostate: Secondary | ICD-10-CM | POA: Diagnosis not present

## 2022-04-14 DIAGNOSIS — F29 Unspecified psychosis not due to a substance or known physiological condition: Secondary | ICD-10-CM | POA: Diagnosis not present

## 2022-04-14 DIAGNOSIS — C61 Malignant neoplasm of prostate: Secondary | ICD-10-CM | POA: Diagnosis not present

## 2022-04-14 IMAGING — MR MR HEAD WO/W CM
14 series · 48 of 48 positions shown · IV contrast (7.5ml Gadavist)
Comparison: [DATE]

CLINICAL DATA: Psychosis, history of advanced prostate cancer

EXAM:
MRI HEAD WITHOUT AND WITH CONTRAST
TECHNIQUE: Multiplanar, multiecho pulse sequences of the brain and surrounding
structures were obtained without and with intravenous contrast.
CONTRAST:  7.5mL GADAVIST GADOBUTROL 1 MMOL/ML IV SOLN

[Series 5: ax dwi_tracew · axial · 3.0mm · 0.65mm/px · z∈[-114,+41]mm · 4 of 48 slices shown]
[im 1/48]
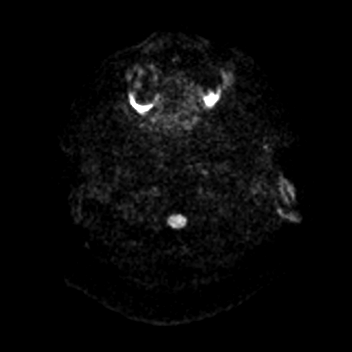
[im 16/48]
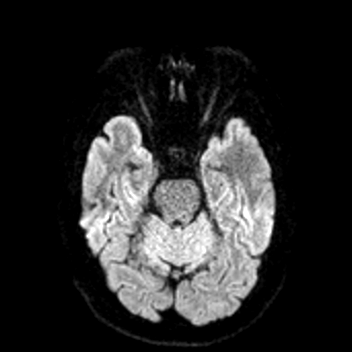
[im 32/48]
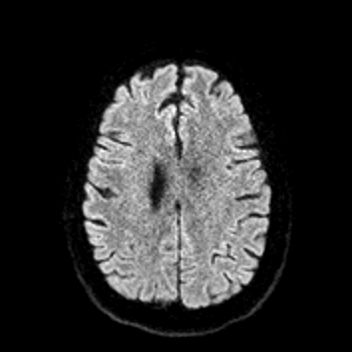
[im 48/48]
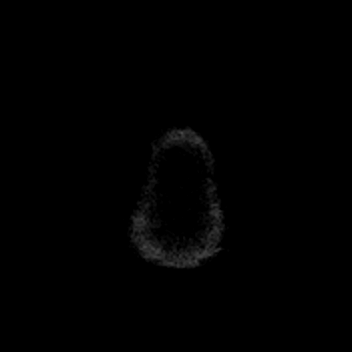

[Series 6: ax dwi_adc · axial · 3.0mm · 0.65mm/px · z∈[-114,+41]mm · 4 of 48 slices shown]
[im 1/48]
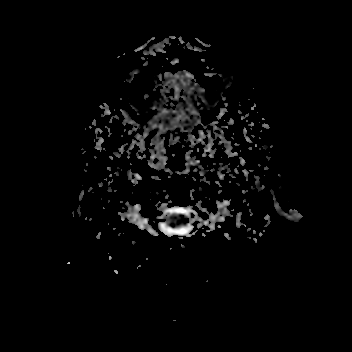
[im 16/48]
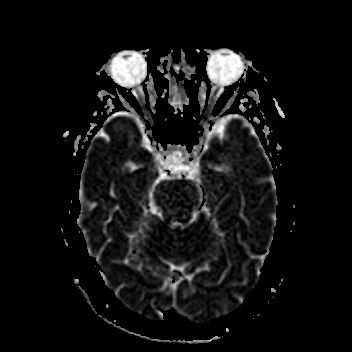
[im 32/48]
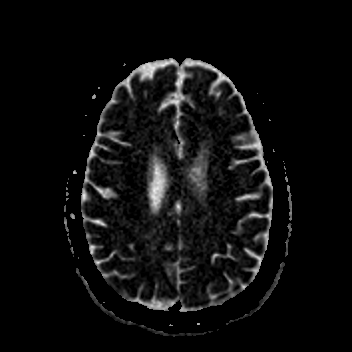
[im 48/48]
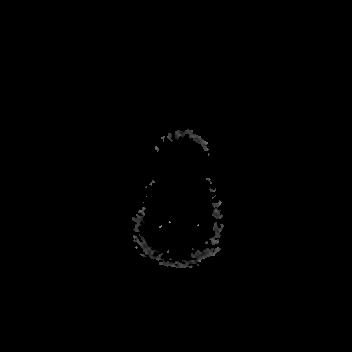

[Series 7: cor dwi_tracew · coronal · 5.0mm · 0.65mm/px · 2 of 40 slices shown]
[im 1/40]
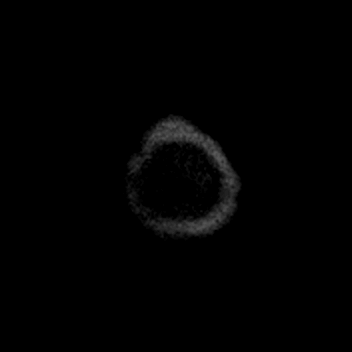
[im 40/40]
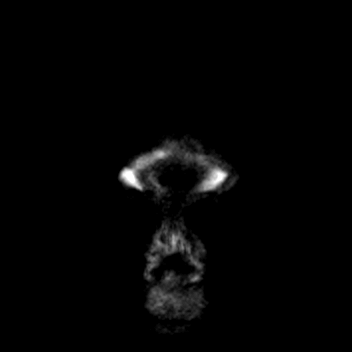

[Series 8: cor dwi_adc · coronal · 5.0mm · 0.65mm/px · 2 of 40 slices shown]
[im 1/40]
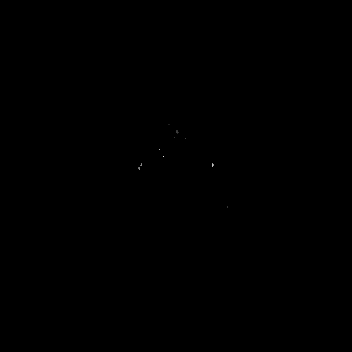
[im 40/40]
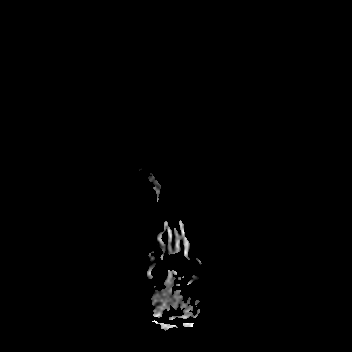

[Series 9: T1 · sagittal · 5.0mm · 0.62mm/px · 1 of 23 slices shown (1 of 2)]
[im 1/23]
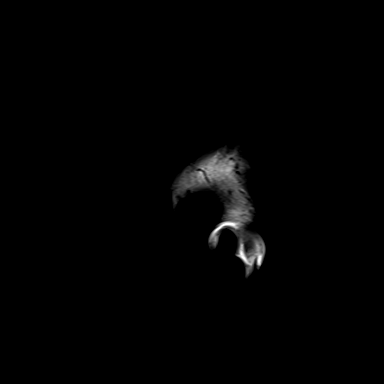

[Series 10: T2 · axial · 5.0mm · 0.53mm/px · 1 of 25 slices shown]
[im 1/25]
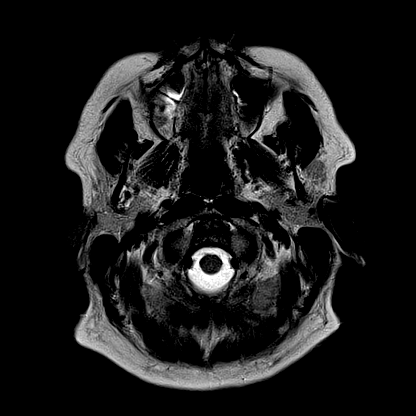

[Series 12: pha_images · axial · 3.0mm · 0.90mm/px · z∈[-125,+46]mm · 3 of 58 slices shown]
[im 1/58]
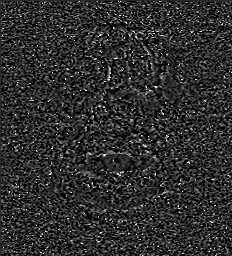
[im 29/58]
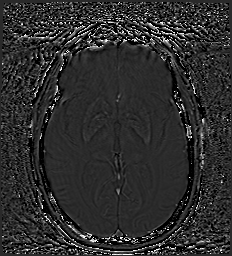
[im 58/58]
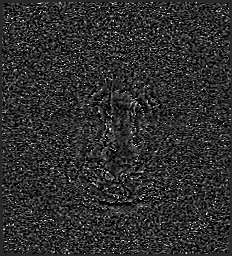

[Series 13: swi_images · axial · 3.0mm · 0.90mm/px · z∈[-125,+52]mm · 3 of 60 slices shown]
[im 1/60]
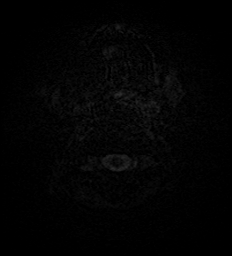
[im 30/60]
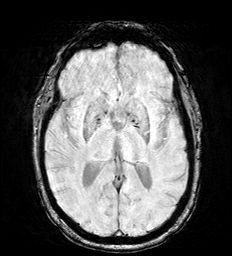
[im 60/60]
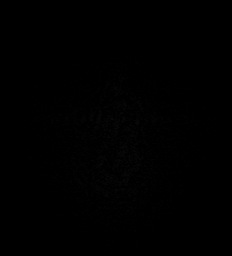

[Series 15: FLAIR · axial · 3.0mm · 0.53mm/px · z∈[-118,+44]mm · 3 of 55 slices shown]
[im 1/55]
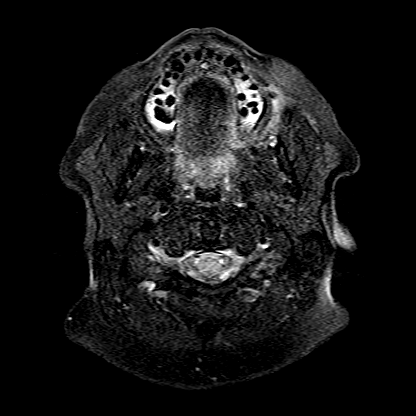
[im 28/55]
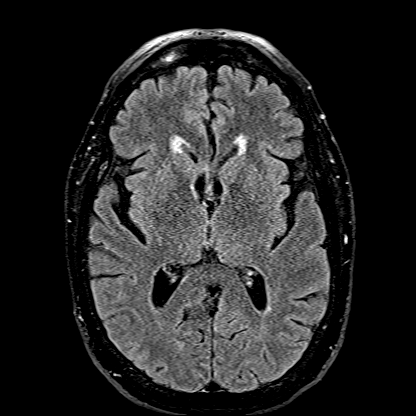
[im 55/55]
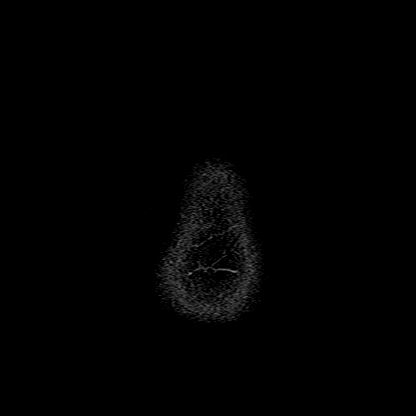

[Series 16: T1 · axial · 1.0mm · 0.98mm/px · z∈[-123,+51]mm · 10 of 176 slices shown (2 of 2)]
[im 1/176]
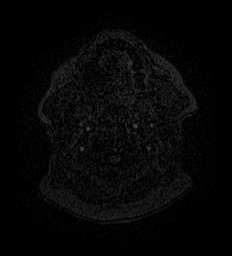
[im 20/176]
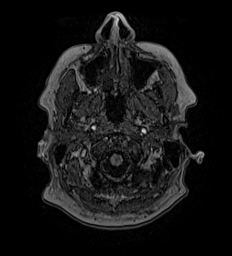
[im 39/176]
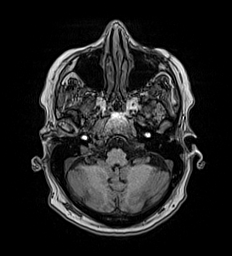
[im 59/176]
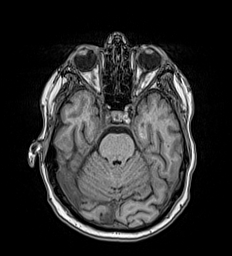
[im 78/176]
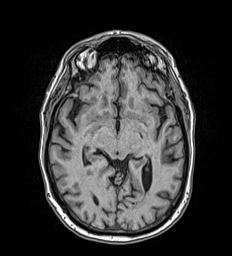
[im 98/176]
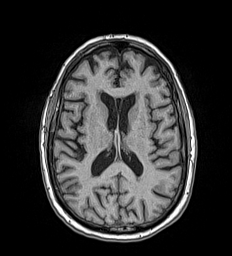
[im 117/176]
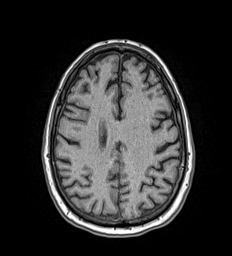
[im 137/176]
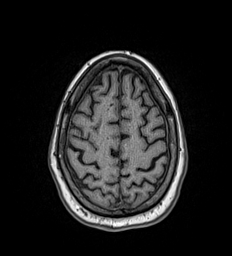
[im 156/176]
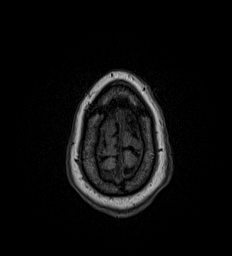
[im 176/176]
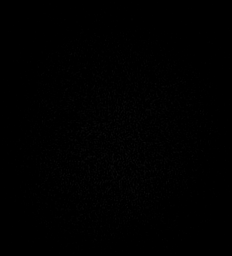

[Series 17: T2 post-contrast · coronal · 5.0mm · 0.57mm/px · 2 of 29 slices shown]
[im 1/29]
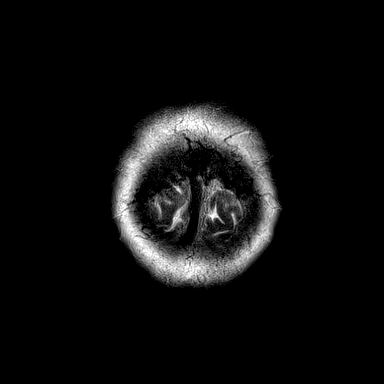
[im 29/29]
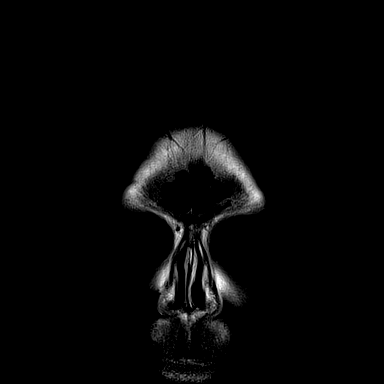

[Series 18: T1 post-contrast · axial · 1.0mm · 0.98mm/px · z∈[-123,+51]mm · 10 of 176 slices shown (1 of 3)]
[im 1/176]
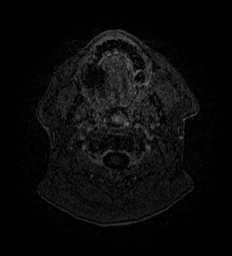
[im 20/176]
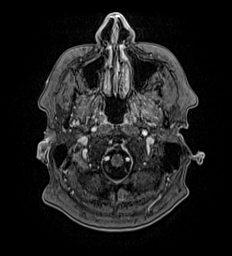
[im 39/176]
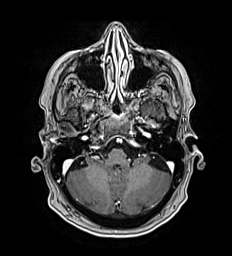
[im 59/176]
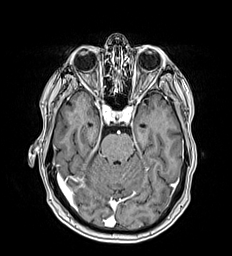
[im 78/176]
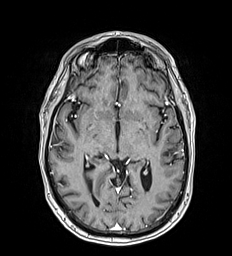
[im 98/176]
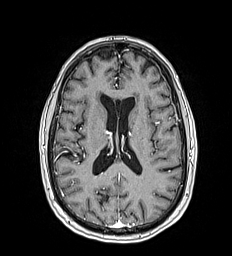
[im 117/176]
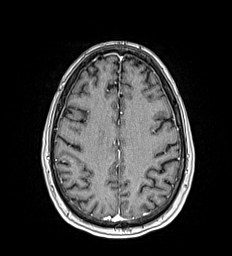
[im 137/176]
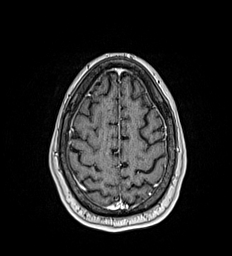
[im 156/176]
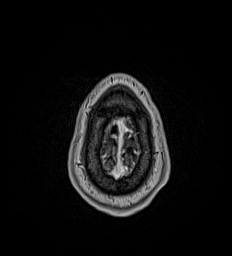
[im 176/176]
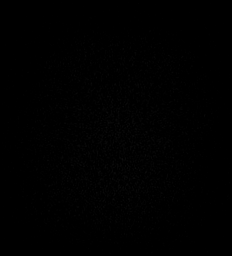

[Series 19: T1 post-contrast · coronal · 5.0mm · 0.57mm/px · 2 of 29 slices shown (2 of 3)]
[im 1/29]
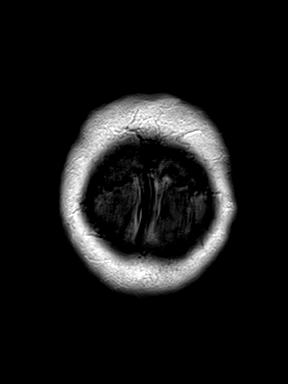
[im 29/29]
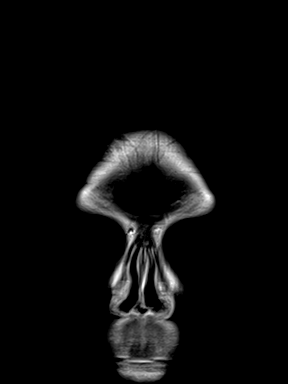

[Series 20: T1 post-contrast · sagittal · 5.0mm · 0.62mm/px · 1 of 23 slices shown (3 of 3)]
[im 1/23]
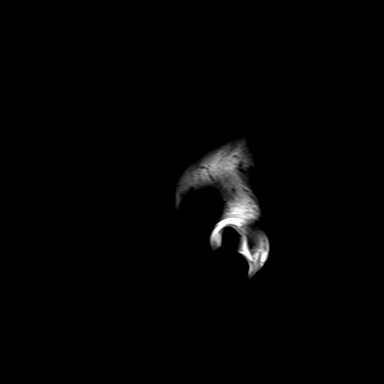

[48 of 48 positions shown; findings below may reference images not displayed]

FINDINGS: Brain: There is no acute infarction or intracranial hemorrhage.
There is no intracranial mass, mass effect, or edema. There is no
hydrocephalus or extra-axial fluid collection. Ventricles and sulci
are within normal limits in size and configuration. Patchy T2
hyperintensity in the supratentorial white matter is nonspecific but
could reflect minor chronic microvascular ischemic changes. No
abnormal enhancement.

Vascular: Major vessel flow voids at the skull base are preserved.

Skull and upper cervical spine: Normal marrow signal is preserved.

Sinuses/Orbits: Minor mucosal thickening.  Orbits are unremarkable.

Other: Sella is unremarkable.  Mastoid air cells are clear.
IMPRESSION: No evidence of recent infarction, hemorrhage, or mass. No abnormal
enhancement.

## 2022-04-14 MED ORDER — GADOBUTROL 1 MMOL/ML IV SOLN
7.5000 mL | Freq: Once | INTRAVENOUS | Status: AC | PRN
  Administered 2022-04-14: 7.5 mL via INTRAVENOUS

## 2022-04-21 NOTE — Telephone Encounter (Signed)
Received a note from [facility] psychiatry that pt was not being accepted for referral.  ?I called to touch base with patient, unable to reach. Will try again later. ?

## 2022-04-22 ENCOUNTER — Telehealth: Payer: Self-pay

## 2022-04-22 DIAGNOSIS — F22 Delusional disorders: Secondary | ICD-10-CM

## 2022-04-22 DIAGNOSIS — R443 Hallucinations, unspecified: Secondary | ICD-10-CM

## 2022-04-22 DIAGNOSIS — F308 Other manic episodes: Secondary | ICD-10-CM

## 2022-04-22 NOTE — Telephone Encounter (Signed)
[place] Night - Client Nonclinical Telephone Record  [facility] Client [facility] Night - Client Client Site [place] Provider [name] - MD Contact Type Call Who Is Calling Patient / Member / Family / Caregiver Caller Name [place] Phone Number [phone] Call Type Message Only Information Provided Reason for Call Returning a Call from the Office Initial Shane [name] states he just missed a call from the doctor. Additional Comment Caller states he just missed a call from provider and needs to speak with him this evening. Date of birth 1951/02/05 . Provided office hours. Disp. Time Disposition Final User 04/21/2022 6:15:01 PM General Information Provided Yes Ignatowski, Tiffany Call Closed By: Gaye Pollack Transaction Date/Time: 04/21/2022 6:11:58 PM (ET

## 2022-04-22 NOTE — Telephone Encounter (Signed)
Yes I tried calling him yesterday evening.  Psychiatry referral was denied by [facility]. I wanted to get an update on his symptoms and see if we need to refer locally.

## 2022-04-22 NOTE — Telephone Encounter (Signed)
Spoke with pt's wife, [name] (on dpr) asking for an update on pt.  States he's having some kind of chemical imbalance in his brain.  Says Dr. Darnell Level is aware.  I relayed Dr. Synthia Innocent message.  She verbalizes understanding and agrees to local referral.  They just want to get him in with someone that can help pt.  Request to use home phn #: [phone] first, then try pt's cell #: [phone] for referral. They are ok with either [place] or [place] area.

## 2022-04-22 NOTE — Addendum Note (Signed)
Addended by: [name] on: 04/22/2022 07:18 PM   Modules accepted: Orders

## 2022-04-22 NOTE — Telephone Encounter (Signed)
I don't see any phn notes and/or results notes showing anyone from our office called pt.  Dr. Darnell Level, did you call pt?

## 2022-04-22 NOTE — Telephone Encounter (Addendum)
New urgent psych referral placed.  Ok with [place] or [place], whoever can seen him sooner.

## 2022-04-22 NOTE — Progress Notes (Signed)
    Chronic Care Management Pharmacy Assistant   Name: Shane [name]  MRN: [id number] DOB: February 27, 1951  Reason for Encounter: CCM (Appointment Reminder)   Medications: Outpatient Encounter Medications as of 04/22/2022  Medication Sig Note   abiraterone acetate (ZYTIGA) 250 MG tablet Take 4 tablets (1,000 mg total) by mouth daily. Take on an empty stomach 1 hour before or 2 hours after a meal    amLODipine (NORVASC) 5 MG tablet Take 1 tablet (5 mg total) by mouth daily. (Patient not taking: Reported on 02/16/2022)    apixaban (ELIQUIS) 5 MG TABS tablet Take 1 tablet (5 mg total) by mouth 2 (two) times daily.    atorvastatin (LIPITOR) 10 MG tablet Take 1 tablet (10 mg total) by mouth daily.    Calcium-Magnesium-Vitamin D (CITRACAL SLOW RELEASE PO) Take 1 tablet by mouth daily.    eszopiclone (LUNESTA) 1 MG TABS tablet Take 1 tablet (1 mg total) by mouth at bedtime as needed for sleep. Take immediately before bedtime    hydrOXYzine (ATARAX) 25 MG tablet Take 0.5-1 tablets (12.5-25 mg total) by mouth 2 (two) times daily as needed for anxiety.    Leuprolide Acetate, 4 Month, (ELIGARD) 30 MG injection Inject 30 mg into the skin every 4 (four) months.    predniSONE (DELTASONE) 5 MG tablet TAKE ONE TABLET BY MOUTH DAILY WITH BREAKFAST (Patient taking differently: Take 5 mg by mouth daily with breakfast.) 02/12/2022: Cont.   No facility-administered encounter medications on file as of 04/22/2022.   Shane [name] was contacted to remind of upcoming telephone visit with Shane [name] on 04/27/2022 at 8:45. Patient was reminded to have any blood glucose and blood pressure readings available for review at appointment.   Patient confirmed appointment.  Are you having any problems with your medications? No   Do you have any concerns you like to discuss with the pharmacist? No  CCM referral has been placed prior to visit?  No   Star Rating Drugs: Medication:  Last Fill: Day Supply Atorvastatin 10  mg 02/16/2022 [place], CPP notified  Marijean Niemann, [place] Pharmacy Assistant [phone]

## 2022-04-23 ENCOUNTER — Telehealth: Payer: Self-pay | Admitting: Family Medicine

## 2022-04-23 NOTE — Telephone Encounter (Signed)
Palma Holter (Spouse) called stating Roque got an appointment at [facility] on Monday and to hold off on finding somewhere else until then. Please return a call when possible   Callback Number: [phone]

## 2022-04-23 NOTE — Telephone Encounter (Signed)
Spoke with wife [name].  [facility] declined referral.  Pt has appt Monday at [facility] with Dr Cheryl Flash.  They will let me know how appt goes.  Discussed possible referral to Dr Nicolasa Ducking if she will take his insurance.

## 2022-04-26 DIAGNOSIS — G4700 Insomnia, unspecified: Secondary | ICD-10-CM | POA: Diagnosis not present

## 2022-04-27 ENCOUNTER — Ambulatory Visit: Payer: PPO | Admitting: Pharmacist

## 2022-04-27 ENCOUNTER — Other Ambulatory Visit (HOSPITAL_COMMUNITY): Payer: Self-pay

## 2022-04-27 ENCOUNTER — Encounter: Payer: Self-pay | Admitting: Family Medicine

## 2022-04-27 DIAGNOSIS — Z86711 Personal history of pulmonary embolism: Secondary | ICD-10-CM

## 2022-04-27 DIAGNOSIS — E7800 Pure hypercholesterolemia, unspecified: Secondary | ICD-10-CM

## 2022-04-27 DIAGNOSIS — F308 Other manic episodes: Secondary | ICD-10-CM

## 2022-04-27 DIAGNOSIS — I10 Essential (primary) hypertension: Secondary | ICD-10-CM

## 2022-04-27 DIAGNOSIS — I700 Atherosclerosis of aorta: Secondary | ICD-10-CM

## 2022-04-27 NOTE — Progress Notes (Unsigned)
Chronic Care Management Pharmacy Note  04/29/2022 Name:  Shane [name] MRN:  [id number] DOB:  [birth year]  Summary: CCM F/U visit -Pt reports he has seen psychiatrist at [facility] (Dr Cheryl Flash) and was started on quetiapine 150 mg, he has been sleeping better with this. He is not taking any other psychoactive meds at this time -Pt stopped taking amlodipine and atorvastatin as well - he reports BP is 115-130/60s without medication and he wants to wait for next lipid panel to decide on statin therapy  Recommendations/Changes made from today's visit: -Updated med list according to what patient is taking at home; it is reasonable to hold off on statin at this time  Plan: -Pharmacist follow up televisit scheduled for 3 months -PCP CPE 05/10/22   Subjective: Shane [name] is an 71 y.o. year old male who is a primary patient of [name], MD.  The CCM team was consulted for assistance with disease management and care coordination needs.    Engaged with patient by telephone for follow up visit in response to provider referral for pharmacy case management and/or care coordination services.   Consent to Services:  The patient was given information about Chronic Care Management services, agreed to services, and gave verbal consent prior to initiation of services.  Please see initial visit note for detailed documentation.   Patient Care Team: [name], MD as PCP - General (Family Medicine) Cira Rue, RN Nurse Navigator as Registered Nurse ([place]) Charlton Haws, [facility] as Pharmacist (Pharmacist)  Recent office visits: 04/22/22 Dr Gwenlyn Found - urgent psych referral locally (denied [facility] psych) 04/12/22 Dr Gwenlyn Found - d/w psychiatrist, concern for drug-induced hypomania. Trial Lunesta for sleep cycle. Rec MRI brian. 03/30/22 Dr Gwenlyn Found - c/o venlafaxine withdrawal. Start Duloxtine 20 mg x 2 weeks as bridge. Also Rx hydroxyzine 25 mg PRN for anxiety, sleep.  02/16/22 Dr Danise Mina OV:  hospital f/u; started amlodipine 5 mg for high BP. Reduced atorvastatin to 10 mg. Reduced venlafaxine to 75 mg. 10/27/21-PCP-[name],MD-Telemedicine-Patient presented for body aches, fever, cough,positive covid home test. [name] x5, latest bivalent booster 09/2021 )Encouraged fluids and rest. Reviewed further supportive care measures at home including vit C 525m bid, vit D 2000 IU daily, zinc 1071mdaily, tylenol PRN, pepcid 2063mID PRN.  Start Molnupiravir 200m26mke 4 capsules  2 times daily for 5 days  08/27/21-Family Medicine-[name],MD- Patient presented for possible blood clot.Patient already on blood thinner, use heat,massage to area, use tylenol,ER  if symptoms worsen. 06/30/21-PCP-[name],MD-Patient presented for follow up hospital stay. Start atorvastatin 20mg51me 1 tablet daily  , just started Eliquis 5mg  2me 1 tablet 2 times daily. Referral to ophthalmology,referral for carotid ultrasound.  Recent consult visits: 12/31/21 Dr ShadadAlen Blewlogy): f/u prostate cancer - completed 6 mos AC for PE. Rec D/C Eliquis. 09/03/21-Oncology-[name],MD-Patient presented for follow up prostate cancer. Discussed PE seen on previous scans, stable. Recommended calcium and vitamin D supplements. Eligard 30mg g72m. 08/11/21-Urology-[name],-no data found 07/22/21-Cardiology-[name],MD-no data found  Hospital visits: Medication Reconciliation was completed by comparing discharge summary, patient's EMR and Pharmacy list, and upon discussion with patient.  ED visit ([facility]) [facility] - acute PE. Restart Eliquis   Admitted to the ED on 06/25/21 due to Pleuritic chest pain. Discharge date was 06/25/21. [facility] Dis[facility] date was [facility]24/21[facility]   Objective:  Lab  Results  Component Value Date   CREATININE 1.16 02/12/2022   BUN 16 02/12/2022   GFR 59.39 (L) 04/27/2021    GFRNONAA >60 02/12/2022   GFRAA >60 09/03/2020   NA 136 02/12/2022   K 3.7 02/12/2022   CALCIUM 9.3 02/12/2022   CO2 23 02/12/2022   GLUCOSE 109 (H) 02/12/2022    Lab Results  Component Value Date/Time   GFR 59.39 (L) 04/27/2021 07:29 AM   GFR 58.22 (L) 04/24/2020 07:41 AM    Last diabetic Eye exam: No results found for: HMDIABEYEEXA  Last diabetic Foot exam: No results found for: HMDIABFOOTEX   Lab Results  Component Value Date   CHOL 196 04/27/2021   HDL 47.00 04/27/2021   LDLCALC 117 (H) 04/27/2021   TRIG 163.0 (H) 04/27/2021   CHOLHDL 4 04/27/2021       Latest Ref Rng & Units 02/12/2022    8:05 AM 12/31/2021   10:02 AM 09/03/2021    9:42 AM  Hepatic Function  Total Protein 6.5 - 8.1 g/dL 6.6   7.4   6.9    Albumin 3.5 - 5.0 g/dL 3.8   4.5   4.0    AST 15 - 41 U/L 26   20   24     ALT 0 - 44 U/L 23   16   21     Alk Phosphatase 38 - 126 U/L 44   50   48    Total Bilirubin 0.3 - 1.2 mg/dL 0.7   0.8   1.0      Lab Results  Component Value Date/Time   TSH 1.76 07/30/2015 08:07 AM       Latest Ref Rng & Units 04/29/2022    8:59 AM 02/12/2022    3:56 AM 02/11/2022    4:39 PM  CBC  WBC 4.0 - 10.5 K/uL 4.7   8.4   8.8    Hemoglobin 13.0 - 17.0 g/dL 12.3   12.8   14.2    Hematocrit 39.0 - 52.0 % 36.5   37.5   43.1    Platelets 150 - 400 K/uL 178   212   238      No results found for: VD25OH  Clinical ASCVD: Yes  The 10-year ASCVD risk score (Arnett DK, et al., 2019) is: 22.6%   Values used to calculate the score:     Age: 71 years     Sex: Male     Is Non-Hispanic African American: No     Diabetic: No     Tobacco smoker: No     Systolic Blood Pressure: 449 mmHg     Is BP treated: No     HDL Cholesterol: 47 mg/dL     Total Cholesterol: 196 mg/dL       05/06/2021    9:56 AM 04/24/2020   10:29 AM 04/08/2020    9:54 AM  Depression screen PHQ 2/9  Decreased Interest 0 0 0  Down, Depressed, Hopeless 0 0 0  PHQ - 2 Score 0 0 0  Altered sleeping  0   Tired,  decreased energy  0   Change in appetite  0   Feeling bad or failure about yourself   0   Trouble concentrating  0   Moving slowly or fidgety/restless  0   Suicidal thoughts  0   PHQ-9 Score  0   Difficult doing work/chores  Not difficult at all      Social History   Tobacco Use  Smoking Status Never  Smokeless Tobacco Never   BP Readings from Last 3 Encounters:  02/16/22 (!) 162/88  02/12/22 (!) 167/83  12/31/21 (!) 156/72   Pulse Readings from Last 3 Encounters:  02/16/22 69  02/12/22 65  12/31/21 64   Wt Readings from Last 3 Encounters:  02/16/22 200 lb 6 oz (90.9 kg)  02/11/22 204 lb 5.9 oz (92.7 kg)  12/31/21 204 lb 4.8 oz (92.7 kg)   BMI Readings from Last 3 Encounters:  02/16/22 27.18 kg/m  02/11/22 27.72 kg/m  12/31/21 27.71 kg/m    Assessment/Interventions: Review of patient past medical history, allergies, medications, health status, including review of consultants reports, laboratory and other test data, was performed as part of comprehensive evaluation and provision of chronic care management services.   SDOH:  (Social Determinants of Health) assessments and interventions performed: Yes SDOH Interventions    Flowsheet Row Most Recent Value  SDOH Interventions   Food Insecurity Interventions Intervention Not Indicated  Financial Strain Interventions Intervention Not Indicated      SDOH Screenings   Alcohol Screen: Not on file  Depression (PHQ2-9): Low Risk    PHQ-2 Score: 0  Financial Resource Strain: Low Risk    Difficulty of Paying Living Expenses: Not very hard  Food Insecurity: No Food Insecurity   Worried About Charity fundraiser in the Last Year: Never true   Ran Out of Food in the Last Year: Never true  Housing: Not on file  Physical Activity: Not on file  Social Connections: Not on file  Stress: Not on file  Tobacco Use: Low Risk    Smoking Tobacco Use: Never   Smokeless Tobacco Use: Never   Passive Exposure: Not on file   Transportation Needs: Not on file    CCM Care Plan  Allergies  Allergen Reactions   Bactrim [Sulfamethoxazole-Trimethoprim] Hives    Red rash, hives   Hydrocodone    Percocet [Oxycodone-Acetaminophen] Other (See Comments)    TURNS RED FROM CHEST UP, LOOKS LIKE SUNBURN AND STARTS TO ITCH BADLY   Vancomycin    Penicillins Swelling and Rash    Did it involve swelling of the face/tongue/throat, SOB, or low BP? No Did it involve sudden or severe rash/hives, skin peeling, or any reaction on the inside of your mouth or nose? No Did you need to seek medical attention at a hospital or doctor's office? No When did it last happen?      50 years ago If all above answers are "NO", may proceed with cephalosporin use.    Zithromax [Azithromycin] Rash    Medications Reviewed Today     Reviewed by Charlton Haws, [facility] (Pharmacist) on 04/27/22 at 0909  Med List Status: <None>   Medication Order Taking? Sig Documenting Provider Last Dose Status Informant  abiraterone acetate (ZYTIGA) 250 MG tablet [id number] Yes Take 4 tablets (1,000 mg total) by mouth daily. Take on an empty stomach 1 hour before or 2 hours after a meal Shadad, Mathis Dad, MD Taking Active Self, Pharmacy Records  apixaban (ELIQUIS) 5 MG TABS tablet [id number] Yes Take 1 tablet (5 mg total) by mouth 2 (two) times daily. [name], MD Taking Active   atorvastatin (LIPITOR) 10 MG tablet [id number] No Take 1 tablet (10 mg total) by mouth daily.  Patient not taking: Reported on 04/27/2022   [name], MD Not Taking Active            Med Note Angelica Ran Apr 27, 2022  9:09 AM) On hold per patient preference until next labwork  Calcium-Magnesium-Vitamin D (CITRACAL SLOW RELEASE PO) [id number] Yes Take 1 tablet by mouth daily. [provider] Taking Active Self, Pharmacy Records  Leuprolide Acetate, 4 Month, (ELIGARD) 30 MG injection [id number] Yes Inject 30 mg into the skin every 4 (four) months.  [provider] Taking Active Self, Pharmacy Records  predniSONE (DELTASONE) 5 MG tablet [id number] Yes TAKE ONE TABLET BY MOUTH DAILY WITH BREAKFAST Alen Blew Mathis Dad, MD Taking Active Self, Pharmacy Records           Med Note Charlton Haws   Tue Apr 27, 2022  9:08 AM)    QUEtiapine (SEROQUEL) 50 MG tablet [id number] Yes Take 150 mg by mouth at bedtime. [provider] Taking Active   Med List Note Darl Pikes, RPH-CPP 12/13/19 1147): Zytiga filled [place]            Patient Active Problem List   Diagnosis Date Noted   Vivid dream 02/16/2022   Pulmonary embolism ([place]) 02/11/2022   COVID-19 virus infection 10/27/2021   Hypertension 10/27/2021   Loss of peripheral visual field, bilateral 07/16/2021   Vision loss of right eye 06/30/2021   History of pulmonary embolism 06/24/2021   Atherosclerosis of aorta ([place]) 05/06/2021   CAD (coronary artery disease) 05/06/2021   History of DVT of lower extremity 02/25/2020   Hydronephrosis 02/13/2020   Loss of taste 01/31/2020   Recurrent UTI 12/31/2019   Malignant neoplasm of prostate ([place]) 08/17/2019   HLD (hyperlipidemia) 08/02/2018   Health maintenance examination 08/05/2017   Anosmia 08/05/2017   Family history of hemochromatosis 08/03/2016   Medicare annual wellness visit, subsequent 07/29/2015   Advanced care planning/counseling discussion 07/29/2015   Bradycardia 07/29/2015   History of cardiac monitoring 07/29/2015   Syncopal episodes     Immunization History  Administered Date(s) Administered   Influenza, High Dose Seasonal PF 10/02/2021   Influenza-Unspecified 10/09/2019   PFIZER(Purple Top)SARS-COV-2 Vaccination 12/28/2019, 01/17/2020, 09/25/2020, 04/20/2021   Pfizer Covid-19 Vaccine Bivalent Booster 23yr & up 10/02/2021   Pneumococcal Conjugate-13 08/03/2016   Pneumococcal Polysaccharide-23 08/05/2017   Tdap 02/02/2017   Zoster Recombinat (Shingrix) 10/09/2019, 03/25/2020     Conditions to be addressed/monitored:  Hyperlipidemia and Coronary Artery Disease, Prostate cancer, Hot flashes, Hx PE  Care Plan : [place] Updates made by FCharlton Haws RBabson Parksince 04/29/2022 12:00 AM     Problem: Hyperlipidemia and Coronary Artery Disease, Prostate cancer, Hot flashes, Hx PE   Priority: High     Long-Range Goal: Disease mgmt   Start Date: 11/20/2021  Expected End Date: 11/20/2022  This Visit's Progress: On track  Recent Progress: On track  Priority: High  Note:   Current Barriers:  Likely drug-induced mental health exacerbation Does not adhere to prescribed medication regimen  Pharmacist Clinical Goal(s):  Patient will contact provider office for questions/concerns as evidenced notation of same in electronic health record through collaboration with PharmD and provider.   Interventions: 1:1 collaboration with GRia Bush MD regarding development and update of comprehensive plan of care as evidenced by provider attestation and co-signature Inter-disciplinary care team collaboration (see longitudinal plan of care) Comprehensive medication review performed; medication list updated in electronic medical record  Elevated BP readings (BP goal <140/90) -Controlled - pt stopped taking amlodipine and BP has been normal at home -Current home readings: 115-130/65-70 -Current treatment: Amlodipine 5 mg daily - not taking -Educated on BP goals and  benefits of medications for prevention of heart attack, stroke and kidney damage; Importance of home blood pressure monitoring; -Counseled to monitor BP at home daily; given controlled BP at home is it reasonable to continue off of medications for now, advised pt to call if he starts to see readings > 140/90 consistently  Hyperlipidemia: (LDL goal < 70) -Not ideally controlled - LDL 117 (04/2021) was above goal given Aortic atherosclerosis on CT 06/2021; pt stopped taking atorvastatin recently  while he is going through significant psychiatric stress - he wants to avoid any medications he can, he wants to wait until repeat lipid panel to decide on statin therapy -Current treatment: Atorvastatin 10 mg daily - not taking -Educated on Cholesterol goals; Benefits of statin for ASCVD risk reduction; -Given mental health issues it is reasonable to remain off statin for now -Recommend repeat lipid panel; consider restarting statin to achieve LDL goal < 70  Mental health (Goal: manage symptoms) -Not ideally controlled, improving -Pt reports irritability, flashbacks started a few months ago following PE and venlafaxine does increase (up to 150 mg); he is also on chronic prednisone and Zytiga for prostate cancer that can contribute to these symptoms as well -Pt established with [facility] (Dr Cheryl Flash) 04/26/22; he has been on quetiapine for a few days and reports it has been helping with sleep -Current treatment: Quetiapine 150 mg daily HS - Appropriate, Effective, Safe, Accessible Lunesta 3 mg HS - not taking (ineffective) Hydroxyzine 25 mg PRN - not taking (ineffective) -Medications previously tried/failed: venlafaxine, duloxetine, Lunesta, hydroxyzine -Updated med list (removed Lunesta, hydroxyzine) -Discussed role of venlafaxine in recent mental health exacerbation - irritability/agitation likely also influenced by chronic prednisone -Recommend to continue current medication  Hx Prostate cancer (Goal: remission) -Controlled  -Hx castration-sensitive prostate cancer Dx 06/2019, 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 Currently in remission -Current treatment  Abiraterone (Zytiga) 250 mg - 4 tab daily empty stomach -Appropriate, Effective, Safe, Accessible Leuprolide (Eligard) 30 mg injection q4 months Appropriate, Effective, Safe, Accessible Prednisone 5 mg daily -Appropriate, Effective, Safe, Accessible -Discussed anti-cancer agents may also have played a role in recent mental health  issues, particularly irritability/agitation with prednisone -Recommended to continue current medication  Recurrent VTE (Goal: prevent VTE) -Controlled  -Hx PE 2020 (in s/o cancer dx, surgery), acute PE 06/2021 (in s/o positive Covid-19). Eliquis was stopped 12/31/21 after 6 months of treatment for previous PE, pt had recurrent PE 02/11/22 and was restarted on Eliquis; at this point indefinite anticoagulation is recommended -Current treatment  Eliquis 5 mg BID - Appropriate, Effective, Safe, Accessible -Medications previously tried: Xarelto  -Recommended to continue current medication  Patient Goals/Self-Care Activities Patient will:  - take medications as prescribed as evidenced by patient report and record review focus on medication adherence by routine check blood pressure periodically, document, and provide at future appointments     Medication Assistance:  Zytiga - [employer] grants through oncology  Compliance/Adherence/Medication fill history: Care Gaps: None  Star-Rating Drugs: Atorvastatin 20 mg - PDC 100%; currently on hold however  Medication Access: Within the past 30 days, how often has patient missed a dose of medication? 0 Is a pillbox or other method used to improve adherence? No  Factors that may affect medication adherence? adverse effects of medications Are meds synced by current pharmacy? No  Are meds delivered by current pharmacy? No  Does patient experience delays in picking up medications due to transportation concerns? No   [facility] Services Reviewed: Is patient disadvantaged to use [facility]?:  No  Current Rx insurance plan: HTA Name and location of Current pharmacy:  [name] [id number], [place] [address] Phone: [phone] Fax: [phone]  [facility] services reviewed with patient today?: No  Patient requests to transfer care to [facility]?: No  Reason  patient declined to change pharmacies: Loyalty to other pharmacy/Patient preference   Care Plan and Follow Up Patient Decision:  Patient agrees to Care Plan and Follow-up.  Plan: Telephone follow up appointment with care management team member scheduled for:  6 months  [name], PharmD, BCACP Clinical Pharmacist [facility] at [facility] [phone]

## 2022-04-28 ENCOUNTER — Telehealth: Payer: Self-pay | Admitting: Oncology

## 2022-04-28 NOTE — Telephone Encounter (Signed)
Called patient regarding upcoming appointment, patient is notified. °

## 2022-04-29 ENCOUNTER — Inpatient Hospital Stay (HOSPITAL_BASED_OUTPATIENT_CLINIC_OR_DEPARTMENT_OTHER): Payer: PPO | Admitting: Oncology

## 2022-04-29 ENCOUNTER — Inpatient Hospital Stay: Payer: PPO | Attending: Oncology

## 2022-04-29 ENCOUNTER — Other Ambulatory Visit: Payer: Self-pay

## 2022-04-29 ENCOUNTER — Inpatient Hospital Stay: Payer: PPO

## 2022-04-29 VITALS — BP 140/66 | HR 67 | Temp 97.8°F | Resp 17 | Ht 72.0 in | Wt 195.2 lb

## 2022-04-29 DIAGNOSIS — I2699 Other pulmonary embolism without acute cor pulmonale: Secondary | ICD-10-CM | POA: Diagnosis not present

## 2022-04-29 DIAGNOSIS — C61 Malignant neoplasm of prostate: Secondary | ICD-10-CM | POA: Diagnosis not present

## 2022-04-29 DIAGNOSIS — Z5111 Encounter for antineoplastic chemotherapy: Secondary | ICD-10-CM | POA: Diagnosis not present

## 2022-04-29 DIAGNOSIS — Z7901 Long term (current) use of anticoagulants: Secondary | ICD-10-CM | POA: Diagnosis not present

## 2022-04-29 DIAGNOSIS — I10 Essential (primary) hypertension: Secondary | ICD-10-CM | POA: Diagnosis not present

## 2022-04-29 LAB — CMP (CANCER CENTER ONLY)
ALT: 15 U/L (ref 0–44)
AST: 20 U/L (ref 15–41)
Albumin: 4 g/dL (ref 3.5–5.0)
Alkaline Phosphatase: 52 U/L (ref 38–126)
Anion gap: 6 (ref 5–15)
BUN: 20 mg/dL (ref 8–23)
CO2: 29 mmol/L (ref 22–32)
Calcium: 9.4 mg/dL (ref 8.9–10.3)
Chloride: 107 mmol/L (ref 98–111)
Creatinine: 1.25 mg/dL — ABNORMAL HIGH (ref 0.61–1.24)
GFR, Estimated: >60 mL/min (ref >60)
Glucose, Bld: 77 mg/dL (ref 70–99)
Potassium: 3.6 mmol/L (ref 3.5–5.1)
Sodium: 142 mmol/L (ref 135–145)
Total Bilirubin: 0.8 mg/dL (ref 0.3–1.2)
Total Protein: 6.5 g/dL (ref 6.5–8.1)

## 2022-04-29 LAB — CBC WITH DIFFERENTIAL (CANCER CENTER ONLY)
Abs Immature Granulocytes: 0.04 10*3/uL (ref 0.00–0.07)
Basophils Absolute: 0 10*3/uL (ref 0.0–0.1)
Basophils Relative: 0 %
Eosinophils Absolute: 0.4 10*3/uL (ref 0.0–0.5)
Eosinophils Relative: 7 %
HCT: 36.5 % — ABNORMAL LOW (ref 39.0–52.0)
Hemoglobin: 12.3 g/dL — ABNORMAL LOW (ref 13.0–17.0)
Immature Granulocytes: 1 %
Lymphocytes Relative: 18 %
Lymphs Abs: 0.9 10*3/uL (ref 0.7–4.0)
MCH: 33.1 pg (ref 26.0–34.0)
MCHC: 33.7 g/dL (ref 30.0–36.0)
MCV: 98.1 fL (ref 80.0–100.0)
Monocytes Absolute: 0.4 10*3/uL (ref 0.1–1.0)
Monocytes Relative: 8 %
Neutro Abs: 3.1 10*3/uL (ref 1.7–7.7)
Neutrophils Relative %: 66 %
Platelet Count: 178 10*3/uL (ref 150–400)
RBC: 3.72 MIL/uL — ABNORMAL LOW (ref 4.22–5.81)
RDW: 12 % (ref 11.5–15.5)
WBC Count: 4.7 10*3/uL (ref 4.0–10.5)
nRBC: 0 % (ref 0.0–0.2)

## 2022-04-29 MED ORDER — LEUPROLIDE ACETATE (4 MONTH) 30 MG ~~LOC~~ KIT
30.0000 mg | PACK | Freq: Once | SUBCUTANEOUS | Status: AC
  Administered 2022-04-29: 30 mg via SUBCUTANEOUS
  Filled 2022-04-29: qty 30

## 2022-04-29 NOTE — Patient Instructions (Signed)
Visit Information  Phone number for Pharmacist: [phone]   Goals Addressed   None     Care Plan : [place]  Updates made by Charlton Haws, [facility] since 04/29/2022 12:00 AM     Problem: Hyperlipidemia and Coronary Artery Disease, Prostate cancer, Hot flashes, Hx PE   Priority: High     Long-Range Goal: Disease mgmt   Start Date: 11/20/2021  Expected End Date: 11/20/2022  This Visit's Progress: On track  Recent Progress: On track  Priority: High  Note:   Current Barriers:  Likely drug-induced mental health exacerbation Does not adhere to prescribed medication regimen  Pharmacist Clinical Goal(s):  Patient will contact provider office for questions/concerns as evidenced notation of same in electronic health record through collaboration with PharmD and provider.   Interventions: 1:1 collaboration with [name], MD regarding development and update of comprehensive plan of care as evidenced by provider attestation and co-signature Inter-disciplinary care team collaboration (see longitudinal plan of care) Comprehensive medication review performed; medication list updated in electronic medical record  Elevated BP readings (BP goal <140/90) -Controlled - pt stopped taking amlodipine and BP has been normal at home -Current home readings: 115-130/65-70 -Current treatment: Amlodipine 5 mg daily - not taking -Educated on BP goals and benefits of medications for prevention of heart attack, stroke and kidney damage; Importance of home blood pressure monitoring; -Counseled to monitor BP at home daily; given controlled BP at home is it reasonable to continue off of medications for now, advised pt to call if he starts to see readings > 140/90 consistently  Hyperlipidemia: (LDL goal < 70) -Not ideally controlled - LDL 117 (04/2021) was above goal given Aortic atherosclerosis on CT 06/2021; pt stopped taking atorvastatin recently while he is going through  significant psychiatric stress - he wants to avoid any medications he can, he wants to wait until repeat lipid panel to decide on statin therapy -Current treatment: Atorvastatin 10 mg daily - not taking -Educated on Cholesterol goals; Benefits of statin for ASCVD risk reduction; -Given mental health issues it is reasonable to remain off statin for now -Recommend repeat lipid panel; consider restarting statin to achieve LDL goal < 70  Mental health (Goal: manage symptoms) -Not ideally controlled, improving -Pt reports irritability, flashbacks started a few months ago following PE and venlafaxine does increase (up to 150 mg); he is also on chronic prednisone and Zytiga for prostate cancer that can contribute to these symptoms as well -Pt established with [facility] (Dr Cheryl Flash) 04/26/22; he has been on quetiapine for a few days and reports it has been helping with sleep -Current treatment: Quetiapine 150 mg daily HS - Appropriate, Effective, Safe, Accessible Lunesta 3 mg HS - not taking (ineffective) Hydroxyzine 25 mg PRN - not taking (ineffective) -Medications previously tried/failed: venlafaxine, duloxetine, Lunesta, hydroxyzine -Updated med list (removed Lunesta, hydroxyzine) -Discussed role of venlafaxine in recent mental health exacerbation - irritability/agitation likely also influenced by chronic prednisone -Recommend to continue current medication  Hx Prostate cancer (Goal: remission) -Controlled  -Hx castration-sensitive prostate cancer Dx 06/2019, 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 Currently in remission -Current treatment  Abiraterone (Zytiga) 250 mg - 4 tab daily empty stomach -Appropriate, Effective, Safe, Accessible Leuprolide (Eligard) 30 mg injection q4 months Appropriate, Effective, Safe, Accessible Prednisone 5 mg daily -Appropriate, Effective, Safe, Accessible -Discussed anti-cancer agents may also have played a role in recent mental health issues, particularly  irritability/agitation with prednisone -Recommended to continue current medication  Recurrent VTE (Goal: prevent  VTE) -Controlled  -Hx PE 2020 (in s/o cancer dx, surgery), acute PE 06/2021 (in s/o positive Covid-19). Eliquis was stopped 12/31/21 after 6 months of treatment for previous PE, pt had recurrent PE 02/11/22 and was restarted on Eliquis; at this point indefinite anticoagulation is recommended -Current treatment  Eliquis 5 mg BID - Appropriate, Effective, Safe, Accessible -Medications previously tried: Xarelto  -Recommended to continue current medication  Patient Goals/Self-Care Activities Patient will:  - take medications as prescribed as evidenced by patient report and record review focus on medication adherence by routine check blood pressure periodically, document, and provide at future appointments      Patient verbalizes understanding of instructions and care plan provided today and agrees to view in [place]. Active MyChart status and patient understanding of how to access instructions and care plan via MyChart confirmed with patient.    Telephone follow up appointment with pharmacy team member scheduled for: 3 months  [name], PharmD, [facility] Clinical Pharmacist [facility] [phone]

## 2022-04-29 NOTE — Progress Notes (Signed)
Hematology and Oncology Follow Up Visit  Shane [name] [id number] 1951/04/21 71 y.o. 04/29/2022 8:51 AM [name], MDGutierrez, [name], MD   Principle Diagnosis: 71 year old man with castration-sensitive advanced prostate cancer diagnosed in 2020.  He presented with Gleason score of 5+4 = 9.   Prior Therapy:  He is status post TURBT and stent placement bilaterally the pathology showed Gleason score 5+4 = 9 prostate cancer invading into the bladder.  Current therapy:   Eligard 30 mg every 4 months.   Last treatment given in January 2023 and will be repeated today.  Zytiga 1000 mg daily with prednisone 5 mg started in September 2020.  Interim History: [name] is here for repeat follow-up.  Since last visit, he was hospitalized in March 2023 with a relapse of his pulmonary embolism and was restarted on Eliquis.  CT scan at that time showed a right-sided pulmonary emboli with some of it is chronic in nature.  There is a likely new clot in the right lower lobe.  He also started experiencing psychiatric issues that required urgent evaluation by psychiatrist.  He is currently on Seroquel which helped stabilize his mood.  He reports feeling better from that standpoint and sleeping throughout the night.  He denies any complications related to Zytiga or prednisone.  He denies any bone pain or pathological fractures.  He denies any hematuria or dysuria.  His performance status quality of life remained reasonable.       Medications: Reviewed without changes. Current Outpatient Medications  Medication Sig Dispense Refill   abiraterone acetate (ZYTIGA) 250 MG tablet Take 4 tablets (1,000 mg total) by mouth daily. Take on an empty stomach 1 hour before or 2 hours after a meal 120 tablet 10   apixaban (ELIQUIS) 5 MG TABS tablet Take 1 tablet (5 mg total) by mouth 2 (two) times daily. 60 tablet 6   atorvastatin (LIPITOR) 10 MG tablet Take 1 tablet (10 mg total) by mouth daily. (Patient  not taking: Reported on 04/27/2022) 30 tablet 6   Calcium-Magnesium-Vitamin D (CITRACAL SLOW RELEASE PO) Take 1 tablet by mouth daily.     Leuprolide Acetate, 4 Month, (ELIGARD) 30 MG injection Inject 30 mg into the skin every 4 (four) months.     predniSONE (DELTASONE) 5 MG tablet TAKE ONE TABLET BY MOUTH DAILY WITH BREAKFAST 90 tablet 0   QUEtiapine (SEROQUEL) 100 MG tablet Take 2 tablets (200 mg total) by mouth at bedtime.     No current facility-administered medications for this visit.     Allergies:  Allergies  Allergen Reactions   Bactrim [Sulfamethoxazole-Trimethoprim] Hives    Red rash, hives   Hydrocodone    Percocet [Oxycodone-Acetaminophen] Other (See Comments)    TURNS RED FROM CHEST UP, LOOKS LIKE SUNBURN AND STARTS TO ITCH BADLY   Vancomycin    Penicillins Swelling and Rash    Did it involve swelling of the face/tongue/throat, SOB, or low BP? No Did it involve sudden or severe rash/hives, skin peeling, or any reaction on the inside of your mouth or nose? No Did you need to seek medical attention at a hospital or doctor's office? No When did it last happen?      50 years ago If all above answers are "NO", may proceed with cephalosporin use.    Zithromax [Azithromycin] Rash        Physical Exam:     Blood pressure 140/66, pulse 67, temperature 97.8 F (36.6 C), temperature source Temporal, resp. rate 17, height 6' (1.829  m), weight 195 lb 3.2 oz (88.5 kg), SpO2 99 %.       ECOG: 0     General appearance: Alert, awake without any distress. Head: Atraumatic without abnormalities Oropharynx: Without any thrush or ulcers. Eyes: No scleral icterus. Lymph nodes: No lymphadenopathy noted in the cervical, supraclavicular, or axillary nodes Heart:regular rate and rhythm, without any murmurs or gallops.   Lung: Clear to auscultation without any rhonchi, wheezes or dullness to percussion. Abdomin: Soft, nontender without any shifting dullness or  ascites. Musculoskeletal: No clubbing or cyanosis. Neurological: No motor or sensory deficits. Skin: No rashes or lesions.            Lab Results: Lab Results  Component Value Date   WBC 8.4 02/12/2022   HGB 12.8 (L) 02/12/2022   HCT 37.5 (L) 02/12/2022   MCV 96.2 02/12/2022   PLT 212 02/12/2022     Chemistry      Component Value Date/Time   NA 136 02/12/2022 0805   NA 139 02/18/2021 1236   K 3.7 02/12/2022 0805   CL 103 02/12/2022 0805   CO2 23 02/12/2022 0805   BUN 16 02/12/2022 0805   BUN 22 02/18/2021 1236   CREATININE 1.16 02/12/2022 0805   CREATININE 1.20 12/31/2021 1002   GLU 113 01/08/2020 0000      Component Value Date/Time   CALCIUM 9.3 02/12/2022 0805   ALKPHOS 44 02/12/2022 0805   AST 26 02/12/2022 0805   AST 20 12/31/2021 1002   ALT 23 02/12/2022 0805   ALT 16 12/31/2021 1002   BILITOT 0.7 02/12/2022 0805   BILITOT 0.8 12/31/2021 1002       Latest Reference Range & Units 05/01/21 08:46 09/03/21 09:42 12/31/21 10:02  Prostate Specific Ag, Serum 0.0 - 4.0 ng/mL <0.1 <0.1 <0.1     Impression and Plan:   71 year old with:   1.    Castration-sensitive advanced prostate cancer with lymphadenopathy diagnosed in July 2020.     He had continues to tolerate treatment reasonably well without any complications from [facility].  Long-term issues including hypertension, edema and adrenal insufficiency were reiterated.  Alternative treatment options including systemic chemotherapy among others were reviewed.  He is agreeable to continue at this time.  The duration of therapy was discussed at this time we will consider updating his staging scans within the next year and de-escalating therapy if he has a complete response.  2.    Bone directed therapy: I recommended continuing calcium and vitamin D supplements.   3.  Pulmonary embolism: He had a relapse after discontinuation of Eliquis and currently back on it.  I recommended continuing this indefinitely.   Etiology of his thromboembolism is unclear could be related to malignancy.   4.  Androgen deprivation: Risks and benefits of continuing androgen depravation long-term were discussed.  Complications including weight gain, hot flashes among others.  He is agreeable to continue.   5.  Hypertension: His blood pressure is close to normal range at this time.  We will continue to monitor on Zytiga.   6.  Follow-up: He will return in 4 months for a follow-up visit.   30 minutes were spent on this encounter.  The time was dedicated to reviewing laboratory data, imaging studies, treatment choices and complication related to cancer and cancer therapy.  Zola Button, MD 5/25/20238:51 AM

## 2022-04-30 ENCOUNTER — Telehealth: Payer: Self-pay | Admitting: *Deleted

## 2022-04-30 LAB — PROSTATE-SPECIFIC AG, SERUM (LABCORP): Prostate Specific Ag, Serum: <0.1 ng/mL (ref 0.0–4.0)

## 2022-04-30 NOTE — Telephone Encounter (Signed)
-----   Message from Shane Portela, MD sent at 04/30/2022  9:25 AM EDT ----- Please let him know his PSA is down

## 2022-04-30 NOTE — Telephone Encounter (Signed)
LM with note below 

## 2022-04-30 NOTE — Telephone Encounter (Signed)
Notified of message below

## 2022-04-30 NOTE — Telephone Encounter (Signed)
Shane [name] called to see if he can have a full body scan as discussed prior to his next visit in 4 months. Or does Dr Alen Blew want to wait longer than 4 months.

## 2022-05-04 ENCOUNTER — Other Ambulatory Visit: Payer: Self-pay | Admitting: Family Medicine

## 2022-05-04 ENCOUNTER — Other Ambulatory Visit (INDEPENDENT_AMBULATORY_CARE_PROVIDER_SITE_OTHER): Payer: PPO

## 2022-05-04 ENCOUNTER — Other Ambulatory Visit (HOSPITAL_COMMUNITY): Payer: Self-pay

## 2022-05-04 DIAGNOSIS — E7800 Pure hypercholesterolemia, unspecified: Secondary | ICD-10-CM

## 2022-05-04 LAB — LIPID PANEL
Cholesterol: 206 mg/dL — ABNORMAL HIGH (ref 0–200)
HDL: 53.4 mg/dL (ref >39.00)
LDL Cholesterol: 131 mg/dL — ABNORMAL HIGH (ref 0–99)
NonHDL: 152.76
Total CHOL/HDL Ratio: 4
Triglycerides: 108 mg/dL (ref 0.0–149.0)
VLDL: 21.6 mg/dL (ref 0.0–40.0)

## 2022-05-04 LAB — TSH: TSH: 2.51 u[IU]/mL (ref 0.35–5.50)

## 2022-05-07 ENCOUNTER — Ambulatory Visit (INDEPENDENT_AMBULATORY_CARE_PROVIDER_SITE_OTHER): Payer: PPO

## 2022-05-07 DIAGNOSIS — Z0000 Encounter for general adult medical examination without abnormal findings: Secondary | ICD-10-CM

## 2022-05-07 NOTE — Patient Instructions (Signed)
[name] , Thank you for taking time to come for your Medicare Wellness Visit. I appreciate your ongoing commitment to your health goals. Please review the following plan we discussed and let me know if I can assist you in the future.   Screening recommendations/referrals: Colonoscopy: FOBT 05/25/2021  due 1year  Recommended yearly ophthalmology/optometry visit for glaucoma screening and checkup Recommended yearly dental visit for hygiene and checkup  Vaccinations: Influenza vaccine: completed  Pneumococcal vaccine: completed  Tdap vaccine: 02/02/2017 Shingles vaccine: completed     Advanced directives: yes   Conditions/risks identified: none   Next appointment: none   Preventive Care 49 Years and Older, Male Preventive care refers to lifestyle choices and visits with your health care provider that can promote health and wellness. What does preventive care include? A yearly physical exam. This is also called an annual well check. Dental exams once or twice a year. Routine eye exams. Ask your health care provider how often you should have your eyes checked. Personal lifestyle choices, including: Daily care of your teeth and gums. Regular physical activity. Eating a healthy diet. Avoiding tobacco and drug use. Limiting alcohol use. Practicing safe sex. Taking low doses of aspirin every day. Taking vitamin and mineral supplements as recommended by your health care provider. What happens during an annual well check? The services and screenings done by your health care provider during your annual well check will depend on your age, overall health, lifestyle risk factors, and family history of disease. Counseling  Your health care provider may ask you questions about your: Alcohol use. Tobacco use. Drug use. Emotional well-being. Home and relationship well-being. Sexual activity. Eating habits. History of falls. Memory and ability to understand (cognition). Work and work  Statistician. Screening  You may have the following tests or measurements: Height, weight, and BMI. Blood pressure. Lipid and cholesterol levels. These may be checked every 5 years, or more frequently if you are over 50 years old. Skin check. Lung cancer screening. You may have this screening every year starting at age 17 if you have a 30-pack-year history of smoking and currently smoke or have quit within the past 15 years. Fecal occult blood test (FOBT) of the stool. You may have this test every year starting at age 34. Flexible sigmoidoscopy or colonoscopy. You may have a sigmoidoscopy every 5 years or a colonoscopy every 10 years starting at age 80. Prostate cancer screening. Recommendations will vary depending on your family history and other risks. Hepatitis C blood test. Hepatitis B blood test. Sexually transmitted disease (STD) testing. Diabetes screening. This is done by checking your blood sugar (glucose) after you have not eaten for a while (fasting). You may have this done every 1-3 years. Abdominal aortic aneurysm (AAA) screening. You may need this if you are a current or former smoker. Osteoporosis. You may be screened starting at age 66 if you are at high risk. Talk with your health care provider about your test results, treatment options, and if necessary, the need for more tests. Vaccines  Your health care provider may recommend certain vaccines, such as: Influenza vaccine. This is recommended every year. Tetanus, diphtheria, and acellular pertussis (Tdap, Td) vaccine. You may need a Td booster every 10 years. Zoster vaccine. You may need this after age 32. Pneumococcal 13-valent conjugate (PCV13) vaccine. One dose is recommended after age 44. Pneumococcal polysaccharide (PPSV23) vaccine. One dose is recommended after age 51. Talk to your health care provider about which screenings and vaccines you need  and how often you need them. This information is not intended to replace  advice given to you by your health care provider. Make sure you discuss any questions you have with your health care provider. Document Released: 12/19/2015 Document Revised: 08/11/2016 Document Reviewed: 09/23/2015 Elsevier Interactive Patient Education  2017 [place] Prevention in the Home Falls can cause injuries. They can happen to people of all ages. There are many things you can do to make your home safe and to help prevent falls. What can I do on the outside of my home? Regularly fix the edges of walkways and driveways and fix any cracks. Remove anything that might make you trip as you walk through a door, such as a raised step or threshold. Trim any bushes or trees on the path to your home. Use bright outdoor lighting. Clear any walking paths of anything that might make someone trip, such as rocks or tools. Regularly check to see if handrails are loose or broken. Make sure that both sides of any steps have handrails. Any raised decks and porches should have guardrails on the edges. Have any leaves, snow, or ice cleared regularly. Use sand or salt on walking paths during winter. Clean up any spills in your garage right away. This includes oil or grease spills. What can I do in the bathroom? Use night lights. Install grab bars by the toilet and in the tub and shower. Do not use towel bars as grab bars. Use non-skid mats or decals in the tub or shower. If you need to sit down in the shower, use a plastic, non-slip stool. Keep the floor dry. Clean up any water that spills on the floor as soon as it happens. Remove soap buildup in the tub or shower regularly. Attach bath mats securely with double-sided non-slip rug tape. Do not have throw rugs and other things on the floor that can make you trip. What can I do in the bedroom? Use night lights. Make sure that you have a light by your bed that is easy to reach. Do not use any sheets or blankets that are too big for your bed.  They should not hang down onto the floor. Have a firm chair that has side arms. You can use this for support while you get dressed. Do not have throw rugs and other things on the floor that can make you trip. What can I do in the kitchen? Clean up any spills right away. Avoid walking on wet floors. Keep items that you use a lot in easy-to-reach places. If you need to reach something above you, use a strong step stool that has a grab bar. Keep electrical cords out of the way. Do not use floor polish or wax that makes floors slippery. If you must use wax, use non-skid floor wax. Do not have throw rugs and other things on the floor that can make you trip. What can I do with my stairs? Do not leave any items on the stairs. Make sure that there are handrails on both sides of the stairs and use them. Fix handrails that are broken or loose. Make sure that handrails are as long as the stairways. Check any carpeting to make sure that it is firmly attached to the stairs. Fix any carpet that is loose or worn. Avoid having throw rugs at the top or bottom of the stairs. If you do have throw rugs, attach them to the floor with carpet tape. Make sure that you  have a light switch at the top of the stairs and the bottom of the stairs. If you do not have them, ask someone to add them for you. What else can I do to help prevent falls? Wear shoes that: Do not have high heels. Have rubber bottoms. Are comfortable and fit you well. Are closed at the toe. Do not wear sandals. If you use a stepladder: Make sure that it is fully opened. Do not climb a closed stepladder. Make sure that both sides of the stepladder are locked into place. Ask someone to hold it for you, if possible. Clearly mark and make sure that you can see: Any grab bars or handrails. First and last steps. Where the edge of each step is. Use tools that help you move around (mobility aids) if they are needed. These  include: Canes. Walkers. Scooters. Crutches. Turn on the lights when you go into a dark area. Replace any light bulbs as soon as they burn out. Set up your furniture so you have a clear path. Avoid moving your furniture around. If any of your floors are uneven, fix them. If there are any pets around you, be aware of where they are. Review your medicines with your doctor. Some medicines can make you feel dizzy. This can increase your chance of falling. Ask your doctor what other things that you can do to help prevent falls. This information is not intended to replace advice given to you by your health care provider. Make sure you discuss any questions you have with your health care provider. Document Released: 09/18/2009 Document Revised: 04/29/2016 Document Reviewed: 12/27/2014 Elsevier Interactive Patient Education  2017 [employer].

## 2022-05-07 NOTE — Progress Notes (Signed)
Subjective:   Shane [name] is a 71 y.o. male who presents for an Subsequent Medicare Annual Wellness Visit.  I connected with Shane [name]  today by telephone and verified that I am speaking with the correct person using two identifiers. Location patient: home Location provider: work Persons participating in the virtual visit: patient, provider.   I discussed the limitations, risks, security and privacy concerns of performing an evaluation and management service by telephone and the availability of in person appointments. I also discussed with the patient that there may be a patient responsible charge related to this service. The patient expressed understanding and verbally consented to this telephonic visit.    Interactive audio and video telecommunications were attempted between this provider and patient, however failed, due to patient having technical difficulties OR patient did not have access to video capability.  We continued and completed visit with audio only.    Review of Systems     Cardiac Risk Factors include: advanced age (>82mn, >>56women);male gender     Objective:    Today's Vitals   There is no height or weight on file to calculate BMI.     05/07/2022    1:14 PM 02/12/2022    4:32 AM 09/24/2020    8:34 AM 02/19/2020    8:54 AM 02/13/2020    4:47 PM 07/06/2019    5:24 PM 07/04/2019    2:17 PM  Advanced Directives  Does Patient Have a Medical Advance Directive? Yes No Yes Yes Yes No No  Type of AParamedicof ATempleLiving will  Living will Living will Living will    Does patient want to make changes to medical advance directive?   Yes (MAU/Ambulatory/Procedural Areas - Information given) No - Patient declined No - Patient declined    Copy of HVinain Chart? No - copy requested        Would patient like information on creating a medical advance directive?      No - Patient declined Yes (MAU/Ambulatory/Procedural  Areas - Information given)    Current Medications (verified) Outpatient Encounter Medications as of 05/07/2022  Medication Sig   abiraterone acetate (ZYTIGA) 250 MG tablet Take 4 tablets (1,000 mg total) by mouth daily. Take on an empty stomach 1 hour before or 2 hours after a meal   apixaban (ELIQUIS) 5 MG TABS tablet Take 1 tablet (5 mg total) by mouth 2 (two) times daily.   Calcium-Magnesium-Vitamin D (CITRACAL SLOW RELEASE PO) Take 1 tablet by mouth daily.   Leuprolide Acetate, 4 Month, (ELIGARD) 30 MG injection Inject 30 mg into the skin every 4 (four) months.   predniSONE (DELTASONE) 5 MG tablet TAKE ONE TABLET BY MOUTH DAILY WITH BREAKFAST   QUEtiapine (SEROQUEL) 100 MG tablet Take 2 tablets (200 mg total) by mouth at bedtime.   atorvastatin (LIPITOR) 10 MG tablet Take 1 tablet (10 mg total) by mouth daily. (Patient not taking: Reported on 05/07/2022)   No facility-administered encounter medications on file as of 05/07/2022.    Allergies (verified) Bactrim [sulfamethoxazole-trimethoprim], Hydrocodone, Percocet [oxycodone-acetaminophen], Vancomycin, Penicillins, and Zithromax [azithromycin]   History: 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 ([facility] 06/28/2019   Cystoscopy ([name] - 2 bladder tumors, 1 on anterior prostate planned TURBT/TURP 06/2019 Biopsy - Gleason 5+4=9 prostate cancer [facility] started 07/2019 planned metastatic survey   History of chicken pox    Lipoma of neck    Seasonal allergies  Syncopal episodes 2007   x 2 s/p unrevealing hospitalization without recurrence   Past Surgical History:  Procedure Laterality Date   CYSTOSCOPY W/ RETROGRADES Bilateral 07/06/2019   Procedure: CYSTOSCOPY WITH RETROGRADE PYELOGRAM, BILATERAL STENT PLACEMENT;  Surgeon: [name], MD;  Location: [facility];  Service: Urology;  Laterality: Bilateral;   EP IMPLANTABLE DEVICE N/A 02/25/2016   Procedure: Loop Recorder Removal;   Surgeon: Deboraha Sprang, MD;  Location: [place] [facility];  Service: Cardiovascular;  Laterality: N/A;   Internal Cardiac Monitor  2007   due to syncopal episodes - never contacted for f/u   IR NEPHROSTOMY PLACEMENT LEFT  02/14/2020   IR NEPHROSTOMY PLACEMENT RIGHT  02/14/2020   LIPOMA EXCISION N/A 02/09/2016   Procedure: EXCISION NECK LIPOMA;  Surgeon: Autumn Messing III, MD;  Location: [place];  Service: General;  Laterality: N/A;   TRANSURETHRAL RESECTION OF BLADDER TUMOR N/A 07/06/2019   Procedure: TRANSURETHRAL RESECTION OF BLADDER TUMOR (TURBT);  Surgeon: [name], MD;  Location: [facility];  Service: Urology;  Laterality: N/A;   Family History  Problem Relation Age of Onset   Hyperlipidemia Mother    Cancer Mother        peritoneal cancer   Hemochromatosis Father        s/p liver transplant - pt screened negative 07/2015   Hemochromatosis Paternal Grandmother    Hemochromatosis Paternal Uncle    Stroke Neg Hx    Diabetes Neg Hx    CAD Neg Hx    Social History   Socioeconomic History   Marital status: Married    Spouse name: Not on file   Number of children: Not on file   Years of education: Not on file   Highest education level: Not on file  Occupational History   Not on file  Tobacco Use   Smoking status: Never   Smokeless tobacco: Never  Vaping Use   Vaping Use: Never used  Substance and Sexual Activity   Alcohol use: Yes    Alcohol/week: 5.0 standard drinks    Types: 5 Cans of beer per week    Comment: 1 beer most evenings   Drug use: No   Sexual activity: Not on file  Other Topics Concern   Not on file  Social History Narrative   Lives with wife   Grown children   Occ: retired Engineer, structural, Musician   Edu: Bachelor of Art   Activity: stays active outdoors working   Diet: some water, fruits/vegetables daily   Social Determinants of Health   Financial Resource Strain: Low Risk    Difficulty of Paying Living Expenses:  Not hard at all  Food Insecurity: No Food Insecurity   Worried About Charity fundraiser in the Last Year: Never true   Arboriculturist in the Last Year: Never true  Transportation Needs: No Transportation Needs   Lack of Transportation (Medical): No   Lack of Transportation (Non-Medical): No  Physical Activity: Sufficiently Active   Days of Exercise per Week: 5 days   Minutes of Exercise per Session: 60 min  Stress: No Stress Concern Present   Feeling of Stress : Not at all  Social Connections: Moderately Integrated   Frequency of Communication with Friends and Family: Three times a week   Frequency of Social Gatherings with Friends and Family: Three times a week   Attends Religious Services: 1 to 4 times per year   Active Member of Clubs or  Organizations: No   Attends Music therapist: Never   Marital Status: Married    Tobacco Counseling Counseling given: Not Answered   Clinical Intake:  Pre-visit preparation completed: Yes  Pain : No/denies pain     Nutritional Risks: None Diabetes: No  How often do you need to have someone help you when you read instructions, pamphlets, or other written materials from your doctor or pharmacy?: 1 - Never What is the last grade level you completed in school?: college  Diabetic?no   Interpreter Needed?: No  Information entered by :: [place] [facility]    05/07/2022    1:17 PM 08/27/2021    2:02 PM  In your present state of health, do you have any difficulty performing the following activities:  Hearing? 0 0  Vision? 0 0  Difficulty concentrating or making decisions? 0 0  Walking or climbing stairs? 0 0  Dressing or bathing? 0 0  Doing errands, shopping? 0 0  Preparing Food and eating ? N   Using the Toilet? N   In the past six months, have you accidently leaked urine? N   Do you have problems with loss of bowel control? N   Managing your Medications? N   Managing your Finances? N    Housekeeping or managing your Housekeeping? N     Patient Care Team: [name], MD as PCP - General (Family Medicine) Cira Rue, RN Nurse Navigator as Registered Nurse (Medical Oncology) Charlton Haws, [facility] as Pharmacist (Pharmacist)  Indicate any recent Medical Services you may have received from other than [facility] providers in the past year (date may be approximate).     Assessment:   This is a routine wellness examination for [place].  Hearing/Vision screen Vision Screening - Comments:: Annual eye exams wear glasses   Dietary issues and exercise activities discussed: Current Exercise Habits: Home exercise routine, Type of exercise: walking;strength training/weights, Time (Minutes): 60, Frequency (Times/Week): 5, Weekly Exercise (Minutes/Week): 300, Intensity: Mild, Exercise limited by: None identified   Goals Addressed   None    Depression Screen    05/07/2022    1:14 PM 05/07/2022    1:13 PM 05/06/2021    9:56 AM 04/24/2020   10:29 AM 04/08/2020    9:54 AM 01/01/2020    2:04 PM 08/18/2018    8:41 AM  PHQ 2/9 Scores  PHQ - 2 Score 0 0 0 0 0 0 0  PHQ- 9 Score    0       Fall Risk    05/07/2022    1:14 PM 05/06/2021    9:56 AM 04/24/2020   10:29 AM 04/08/2020    9:54 AM 01/31/2020    8:42 AM  Fall Risk   Falls in the past year? 0 0 0 0 0  Number falls in past yr: 0  0    Injury with Fall? 0  0    Risk for fall due to :   Medication side effect    Follow up Falls evaluation completed  Falls evaluation completed;Falls prevention discussed Falls evaluation completed Falls evaluation completed    FALL RISK PREVENTION PERTAINING TO THE HOME:  Any stairs in or around the home? Yes  If so, are there any without handrails? No  Home free of loose throw rugs in walkways, pet beds, electrical cords, etc? Yes  Adequate lighting in your home to reduce risk of falls? Yes   ASSISTIVE DEVICES UTILIZED TO PREVENT FALLS:  Life alert? No  Use of a cane, walker or w/c? No  Grab  bars in the bathroom? No  Shower chair or bench in shower? No  Elevated toilet seat or a handicapped toilet? No    Cognitive Function:Normal cognitive status assessed by telephone conversation by this Nurse Health Advisor. No abnormalities found.      04/24/2020   10:31 AM  MMSE - Mini Mental State Exam  Orientation to time 5  Orientation to Place 5  Registration 3  Attention/ Calculation 5  Recall 3  Language- repeat 1        Immunizations Immunization History  Administered Date(s) Administered   Influenza, High Dose Seasonal PF 10/02/2021   Influenza-Unspecified 10/09/2019   PFIZER(Purple Top)SARS-COV-2 Vaccination 12/28/2019, 01/17/2020, 09/25/2020, 04/20/2021   Pfizer Covid-19 Vaccine Bivalent Booster 36yr & up 10/02/2021   Pneumococcal Conjugate-13 08/03/2016   Pneumococcal Polysaccharide-23 08/05/2017   Tdap 02/02/2017   Zoster Recombinat (Shingrix) 10/09/2019, 03/25/2020    TDAP status: Up to date  Flu Vaccine status: Up to date  Pneumococcal vaccine status: Up to date  Covid-19 vaccine status: Completed vaccines  Qualifies for Shingles Vaccine? Yes   Zostavax completed Yes   Shingrix Completed?: Yes  Screening Tests Health Maintenance  Topic Date Due   COLON CANCER SCREENING ANNUAL FOBT  05/25/2022   INFLUENZA VACCINE  07/06/2022   Fecal DNA (Cologuard)  05/09/2023   TETANUS/TDAP  02/02/2027   Pneumonia Vaccine 71 Years old  Completed   COVID-19 Vaccine  Completed   Hepatitis C Screening  Completed   Zoster Vaccines- Shingrix  Completed   HPV VACCINES  Aged Out    Health Maintenance  There are no preventive care reminders to display for this patient.  Colorectal cancer screening: Type of screening: FOBT/FIT. Completed 05/25/2021. Repeat every 1 years  Lung Cancer Screening: (Low Dose CT Chest recommended if Age 873-80years, 30 pack-year currently smoking OR have quit w/in 15years.) does not qualify.   Lung Cancer Screening Referral:  n/a  Additional Screening:  Hepatitis C Screening: does not qualify; Completed 07/30/2016  Vision Screening: Recommended annual ophthalmology exams for early detection of glaucoma and other disorders of the eye. Is the patient up to date with their annual eye exam?  Yes  Who is the provider or what is the name of the office in which the patient attends annual eye exams? Dr.Bell If pt is not established with a provider, would they like to be referred to a provider to establish care? No .   Dental Screening: Recommended annual dental exams for proper oral hygiene  Community Resource Referral / Chronic Care Management: CRR required this visit?  No   CCM required this visit?  No      Plan:     I have personally reviewed and noted the following in the patient's chart:   Medical and social history Use of alcohol, tobacco or illicit drugs  Current medications and supplements including opioid prescriptions. Patient is not currently taking opioid prescriptions. Functional ability and status Nutritional status Physical activity Advanced directives List of other physicians Hospitalizations, surgeries, and ER visits in previous 12 months Vitals Screenings to include cognitive, depression, and falls Referrals and appointments  In addition, I have reviewed and discussed with patient certain preventive protocols, quality metrics, and best practice recommendations. A written personalized care plan for preventive services as well as general preventive health recommendations were provided to patient.     LRandel Pigg LPN   [id number]  Nurse  Notes: none

## 2022-05-10 ENCOUNTER — Encounter: Payer: PPO | Admitting: Family Medicine

## 2022-05-11 ENCOUNTER — Ambulatory Visit (INDEPENDENT_AMBULATORY_CARE_PROVIDER_SITE_OTHER)
Admission: RE | Admit: 2022-05-11 | Discharge: 2022-05-11 | Disposition: A | Discharge status: Home / Self Care | Payer: PPO | Source: Ambulatory Visit | Attending: Family Medicine | Admitting: Family Medicine

## 2022-05-11 ENCOUNTER — Ambulatory Visit (INDEPENDENT_AMBULATORY_CARE_PROVIDER_SITE_OTHER): Payer: PPO | Admitting: Family Medicine

## 2022-05-11 ENCOUNTER — Encounter: Payer: Self-pay | Admitting: Family Medicine

## 2022-05-11 VITALS — BP 127/69 | HR 65 | Temp 97.9°F | Ht 70.25 in | Wt 193.1 lb

## 2022-05-11 DIAGNOSIS — Z7189 Other specified counseling: Secondary | ICD-10-CM

## 2022-05-11 DIAGNOSIS — I10 Essential (primary) hypertension: Secondary | ICD-10-CM | POA: Diagnosis not present

## 2022-05-11 DIAGNOSIS — M79641 Pain in right hand: Secondary | ICD-10-CM

## 2022-05-11 DIAGNOSIS — Z86711 Personal history of pulmonary embolism: Secondary | ICD-10-CM | POA: Diagnosis not present

## 2022-05-11 DIAGNOSIS — S6991XA Unspecified injury of right wrist, hand and finger(s), initial encounter: Secondary | ICD-10-CM

## 2022-05-11 DIAGNOSIS — E7800 Pure hypercholesterolemia, unspecified: Secondary | ICD-10-CM | POA: Diagnosis not present

## 2022-05-11 DIAGNOSIS — C61 Malignant neoplasm of prostate: Secondary | ICD-10-CM | POA: Diagnosis not present

## 2022-05-11 DIAGNOSIS — Z86718 Personal history of other venous thrombosis and embolism: Secondary | ICD-10-CM

## 2022-05-11 DIAGNOSIS — F1994 Other psychoactive substance use, unspecified with psychoactive substance-induced mood disorder: Secondary | ICD-10-CM | POA: Diagnosis not present

## 2022-05-11 DIAGNOSIS — I700 Atherosclerosis of aorta: Secondary | ICD-10-CM

## 2022-05-11 DIAGNOSIS — Z0000 Encounter for general adult medical examination without abnormal findings: Secondary | ICD-10-CM

## 2022-05-11 DIAGNOSIS — Z8349 Family history of other endocrine, nutritional and metabolic diseases: Secondary | ICD-10-CM | POA: Diagnosis not present

## 2022-05-11 DIAGNOSIS — M7989 Other specified soft tissue disorders: Secondary | ICD-10-CM | POA: Diagnosis not present

## 2022-05-11 IMAGING — DX DG HAND COMPLETE 3+V*R*
3 series · 3 of 3 positions shown · non-contrast
Comparison: None Available.

CLINICAL DATA: Right hand pain and swelling after injury 2 weeks
ago

EXAM:
RIGHT HAND - COMPLETE 3+ VIEW

[hand pa]
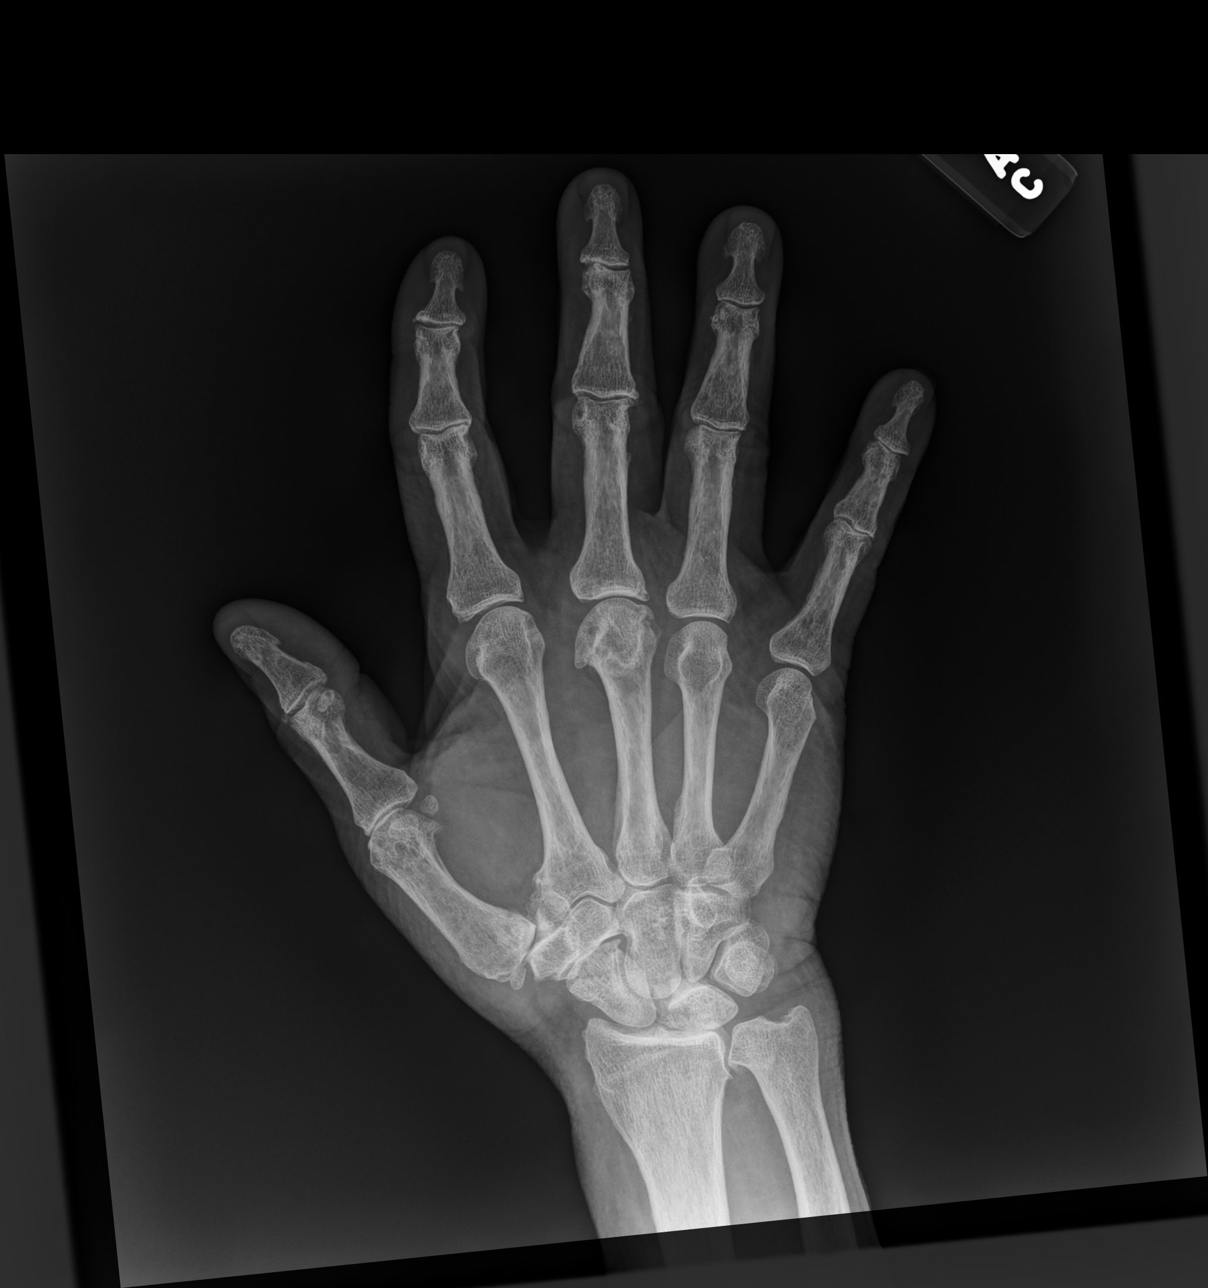

[hand mlo]
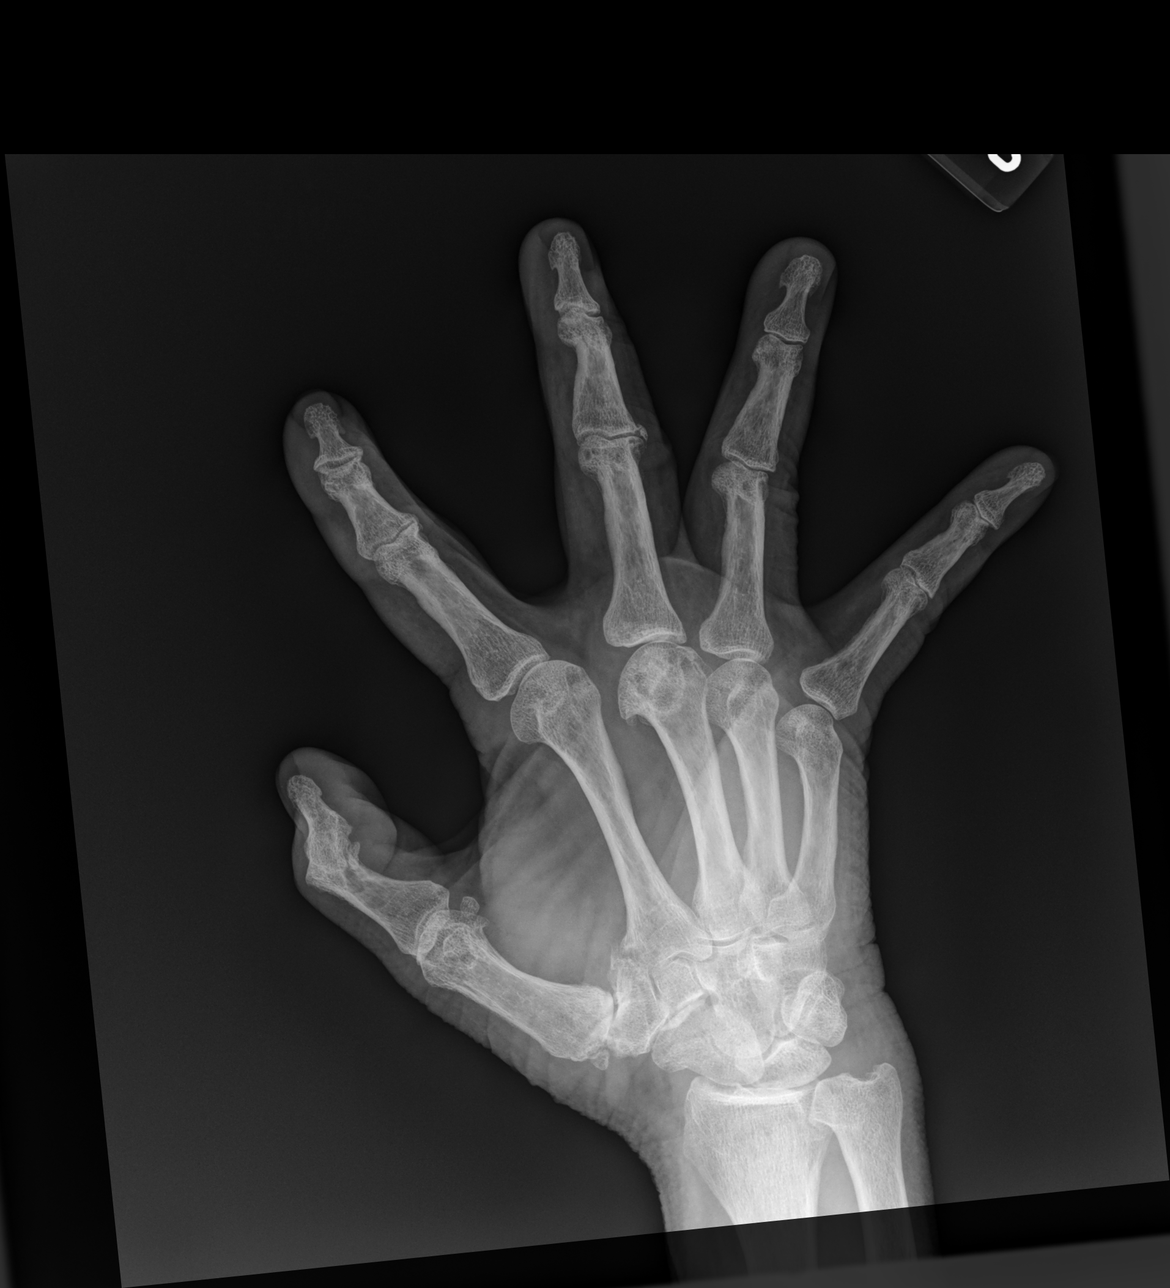

[hand lat]
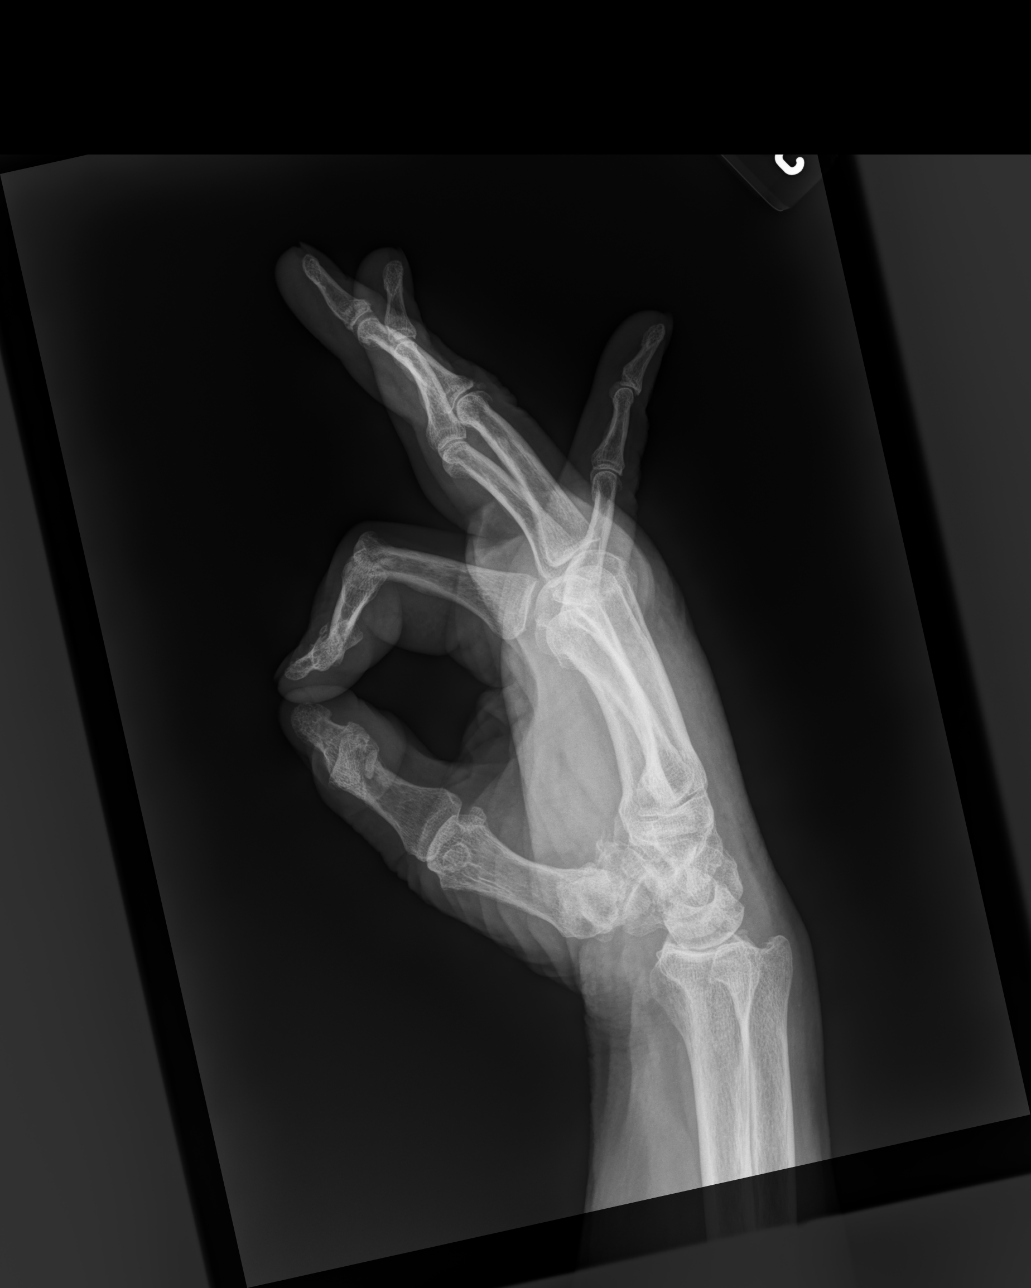

[3 of 3 positions shown; findings below may reference images not displayed]

FINDINGS: No evidence of acute fracture. No dislocation. Moderate to severe
osteoarthritic changes throughout the right hand, most severely
involving the first CMC joint, first MCP joint, and third MCP joint.
Soft tissue swelling is seen over the dorsum of the hand.
IMPRESSION: No acute fracture or dislocation, right hand. Soft tissue swelling
over the dorsum of the hand.

## 2022-05-11 NOTE — Assessment & Plan Note (Addendum)
Mild. Desires to stay off statin at this time.

## 2022-05-11 NOTE — Assessment & Plan Note (Signed)
Continues eligard, zytiga, prednisone '5mg'$  daily. Appreciate onc care.

## 2022-05-11 NOTE — Progress Notes (Signed)
Patient ID: Shane [name], male    DOB: 1951/11/10, 71 y.o.   MRN: [id number]  This visit was conducted in person.  BP 127/69 Comment: at home this morning  Pulse 65   Temp 97.9 F (36.6 C) (Temporal)   Ht 5' 10.25" (1.784 m)   Wt 193 lb 2 oz (87.6 kg)   SpO2 97%   BMI 27.51 kg/m    Elevated on repeat testing  CC: CPE Subjective:   HPI: Shane [name] is a 71 y.o. male presenting on 05/11/2022 for Annual Exam [facility] prt 2.  Pt accompanied by wife, Shane [name]. )   Saw health advisor last week for medicare wellness visit. Note reviewed.    No results found.  Flowsheet Row Clinical Support from 05/07/2022 in [place] at [place]  PHQ-2 Total Score 0          05/07/2022    1:14 PM 05/06/2021    9:56 AM 04/24/2020   10:29 AM 04/08/2020    9:54 AM 01/31/2020    8:42 AM  Fall Risk   Falls in the past year? 0 0 0 0 0  Number falls in past yr: 0  0    Injury with Fall? 0  0    Risk for fall due to :   Medication side effect    Follow up Falls evaluation completed  Falls evaluation completed;Falls prevention discussed Falls evaluation completed Falls evaluation completed    Recent mental decompensation after Effexor 167m treatment leading to increased aggression, agitation, obsessional thoughts and possible visual hallucinations. Self tapered off Effexor, saw [facility] psychiatrist started on seroquel 2039mnightly with benefit. He did use lunesta temporarily given associated insomnia. [facility]   Metastatic prostate cancer to bladder, LN, and bone dx (7/1/4445complicated by PE s/p xarelto and pseudomonas UTI resistant to treatment s/p initial stents then bilateral nephrostomy tubes (removed 2021) s/p IV cefepime course via PICC line. Saw urology (HLouis Meckelonc (SAlen Blewand ID (CMegan Salon Continues zytiga and lupron injection Q6 mo, tolerating well except for hot flashes. Planned androgen deprivation indefinitely as well as lifelong eliquis.      Recently saw onc (SAlen Blew5/2023, note reviewed - continues Eligard 3061m4 months with Zytiga 1000m19mily with prednisone 5mg 71mly since 08/2019.   Hit R dorsal hand with tractor latch 2 wks ago.   Home BP readings overall well controlled - recently 127/69.  Currently off atorvastatin 10mg.22mPreventative: Colonoscopy remotely normal. Cologuard normal 2017, positive 05/2020 (while actively inflamed hemorrhoids), subsequent iFOB returned negative 05/2020. agrees to rpt iFOB.  Prostate cancer - see above.  Lung cancer screening - not eligible Flu shot - declines  COVID vaccine Pfizer 12/2019, 01/2020, booster 09/2020, 04/2021, bivalent 09/2021 Prevnar-13 2017, pneumovax 07/2017 Tdap 01/2017 Shingrix - 10/2019, 03/2020 Advanced planning - does not have this set up. Working with LawyerChief Executive Officerning on setting up, to see lawyer. Wife would be HCPOA. Packet previously provided.  Seat belt use discussed  Sunscreen use discussed, no changing moles on skin. Sleep - averaging 8 hours/night  Not a smoker.  Alcohol - 1-2 beer/day  Dentist yearly Eye exam yearly Bowel - no constipation Bladder - nocturia x1-2, no incontinence.   Lives with wife Grown children Occ: retired real eEngineer, structuralwbMusicianBachelor of Art Activity: stays active working outdoors  Diet: some water, fruits/vegetables daily      Relevant past medical, surgical, family and social history reviewed and updated as  indicated. Interim medical history since our last visit reviewed. Allergies and medications reviewed and updated. Outpatient Medications Prior to Visit  Medication Sig Dispense Refill   abiraterone acetate (ZYTIGA) 250 MG tablet Take 4 tablets (1,000 mg total) by mouth daily. Take on an empty stomach 1 hour before or 2 hours after a meal 120 tablet 10   apixaban (ELIQUIS) 5 MG TABS tablet Take 1 tablet (5 mg total) by mouth 2 (two) times daily. 60 tablet 6   Calcium-Magnesium-Vitamin D (CITRACAL  SLOW RELEASE PO) Take 1 tablet by mouth daily.     Leuprolide Acetate, 4 Month, (ELIGARD) 30 MG injection Inject 30 mg into the skin every 4 (four) months.     predniSONE (DELTASONE) 5 MG tablet TAKE ONE TABLET BY MOUTH DAILY WITH BREAKFAST 90 tablet 0   QUEtiapine (SEROQUEL) 100 MG tablet Take 2 tablets (200 mg total) by mouth at bedtime.     atorvastatin (LIPITOR) 10 MG tablet Take 1 tablet (10 mg total) by mouth daily. (Patient not taking: Reported on 05/07/2022) 30 tablet 6   No facility-administered medications prior to visit.     Per HPI unless specifically indicated in ROS section below Review of Systems  Constitutional:  Negative for activity change, appetite change, chills, fatigue, fever and unexpected weight change.  HENT:  Negative for hearing loss.   Eyes:  Negative for visual disturbance.  Respiratory:  Negative for chest tightness, shortness of breath and wheezing. Cough: occ.  Cardiovascular:  Negative for chest pain, palpitations and leg swelling.  Gastrointestinal:  Positive for diarrhea (occ). Negative for abdominal distention, abdominal pain, blood in stool, constipation, nausea and vomiting.  Genitourinary:  Negative for difficulty urinating and hematuria.  Musculoskeletal:  Negative for arthralgias, myalgias and neck pain.  Skin:  Negative for rash.  Neurological:  Negative for dizziness, seizures, syncope and headaches.  Hematological:  Negative for adenopathy. Bruises/bleeds easily (occ).  Psychiatric/Behavioral:  Negative for dysphoric mood. The patient is not nervous/anxious.        Stable period on seroquel    Objective:  BP 127/69 Comment: at home this morning  Pulse 65   Temp 97.9 F (36.6 C) (Temporal)   Ht 5' 10.25" (1.784 m)   Wt 193 lb 2 oz (87.6 kg)   SpO2 97%   BMI 27.51 kg/m   Wt Readings from Last 3 Encounters:  05/11/22 193 lb 2 oz (87.6 kg)  04/29/22 195 lb 3.2 oz (88.5 kg)  02/16/22 200 lb 6 oz (90.9 kg)      Physical Exam Vitals and  nursing note reviewed.  Constitutional:      General: He is not in acute distress.    Appearance: Normal appearance. He is well-developed. He is not ill-appearing.  HENT:     Head: Normocephalic and atraumatic.     Right Ear: Hearing, tympanic membrane, ear canal and external ear normal.     Left Ear: Hearing, tympanic membrane, ear canal and external ear normal.  Eyes:     General: No scleral icterus.    Extraocular Movements: Extraocular movements intact.     Conjunctiva/sclera: Conjunctivae normal.     Pupils: Pupils are equal, round, and reactive to light.  Neck:     Thyroid: No thyroid mass or thyromegaly.     Vascular: No carotid bruit.  Cardiovascular:     Rate and Rhythm: Normal rate and regular rhythm.     Pulses: Normal pulses.          Radial pulses  are 2+ on the right side and 2+ on the left side.     Heart sounds: Normal heart sounds. No murmur heard. Pulmonary:     Effort: Pulmonary effort is normal. No respiratory distress.     Breath sounds: Normal breath sounds. No wheezing, rhonchi or rales.  Abdominal:     General: Bowel sounds are normal. There is no distension.     Palpations: Abdomen is soft. There is no mass.     Tenderness: There is no abdominal tenderness. There is no guarding or rebound.     Hernia: No hernia is present.  Musculoskeletal:        General: Swelling and tenderness present. Normal range of motion.     Cervical back: Normal range of motion and neck supple.     Right lower leg: No edema.     Left lower leg: No edema.     Comments: Marked tender swelling to R dorsal hand focused along 3rd MCPJ, with point tenderness to palpation of 3rd and 4th MC shafts, no significant pain with axial loading of digits   Lymphadenopathy:     Cervical: No cervical adenopathy.  Skin:    General: Skin is warm and dry.     Findings: Bruising (to dorsal R hand) present. No rash.  Neurological:     General: No focal deficit present.     Mental Status: He is  alert and oriented to person, place, and time.     Comments:  Recall 3/3 Calculation 5/5 DLROW  Psychiatric:        Mood and Affect: Mood normal.        Behavior: Behavior normal.        Thought Content: Thought content normal.        Judgment: Judgment normal.       Results for orders placed or performed in visit on 05/04/22  TSH  Result Value Ref Range   TSH 2.51 0.35 - 5.50 uIU/mL  Lipid panel  Result Value Ref Range   Cholesterol 206 (H) 0 - 200 mg/dL   Triglycerides 108.0 0.0 - 149.0 mg/dL   HDL 53.40 >39.00 mg/dL   VLDL 21.6 0.0 - 40.0 mg/dL   LDL Cholesterol 131 (H) 0 - 99 mg/dL   Total CHOL/HDL Ratio 4    NonHDL 152.76    Lab Results  Component Value Date   ALT 15 04/29/2022   AST 20 04/29/2022   ALKPHOS 52 04/29/2022   BILITOT 0.8 04/29/2022    Lab Results  Component Value Date   WBC 4.7 04/29/2022   HGB 12.3 (L) 04/29/2022   HCT 36.5 (L) 04/29/2022   MCV 98.1 04/29/2022   PLT 178 04/29/2022    Lab Results  Component Value Date   CREATININE 1.25 (H) 04/29/2022   BUN 20 04/29/2022   NA 142 04/29/2022   K 3.6 04/29/2022   CL 107 04/29/2022   CO2 29 04/29/2022    Lab Results  Component Value Date   IRON 112 08/03/2018   Assessment & Plan:   Problem List Items Addressed This Visit     Advanced care planning/counseling discussion (Chronic)    Advanced planning - does not have this set up. Working with Chief Executive Officer. Planning on setting up, to see lawyer. Wife would be HCPOA. Packet previously provided.       Health maintenance examination - Primary (Chronic)    Preventative protocols reviewed and updated unless pt declined. Discussed healthy diet and lifestyle.  Family history of hemochromatosis    LFTs remain normal.  Check %sat/ferritin next labwork      HLD (hyperlipidemia)    Statin started 06/2021, dose dropped 02/2022, came off med 04/2022.  Reviewed ASCVD - desires to stay off statin at this time. Recommend retesting cholesterol levels  at 6 month follow up visit.  The 10-year ASCVD risk score (Arnett DK, et al., 2019) is: 18.8%   Values used to calculate the score:     Age: 18 years     Sex: Male     Is Non-Hispanic African American: No     Diabetic: No     Tobacco smoker: No     Systolic Blood Pressure: 539 mmHg     Is BP treated: No     HDL Cholesterol: 53.4 mg/dL     Total Cholesterol: 206 mg/dL       Malignant neoplasm of prostate (HCC)    Continues eligard, zytiga, prednisone 52m daily. Appreciate onc care.       History of DVT of lower extremity   Atherosclerosis of aorta (HCC)    Mild. Desires to stay off statin at this time.       History of pulmonary embolism    Continues lifelong anticoagulation due to recurrent PE (acute on chronic) 02/2022      Hypertension    Elevated reading in office, largely averaging well controlled at home - will continue off medication at this time. I did ask him to continue monitoring BP at home and notify me if consistently >140/90 to restart amlodipine.       Substance or medication-induced bipolar and related disorder ([place]    Possible substance induced manic episode s/p [facility] psychiatry evaluation. Thought Effexor related - avoid SSRI/SNRIs. Consider contribution of leuprolide. Currently managed with seroquel 2019mnightly with benefit in sleep, planned close psych f/u       Hand injury, right, initial encounter    Concern for fracture given point tenderness and marked swelling present 2 wks after injury - check hand films.       Relevant Orders   DG Hand Complete Right (Completed)     No orders of the defined types were placed in this encounter.  Orders Placed This Encounter  Procedures   DG Hand Complete Right    Standing Status:   Future    Number of Occurrences:   1    Standing Expiration Date:   05/12/2023    Order Specific Question:   Reason for Exam (SYMPTOM  OR DIAGNOSIS REQUIRED)    Answer:   R dorsal hand pain and swelling after injury 2 wks ago     Order Specific Question:   Preferred imaging location?    Answer:   [facility]  Patient instructions: Pass by lab to pick up stool kit today.  [place] copy of your advanced directive when completed.  Ok to stay off atorvastatin - continue healthy diet choices to control cholesterol (good fiber and legumes in the diet).  Right hand xray today  Return as needed or in 6 months for follow up visit.   Follow up plan: Return in about 6 months (around 11/10/2022), or if symptoms worsen or fail to improve, for follow up visit.  JaRia BushMD

## 2022-05-11 NOTE — Assessment & Plan Note (Signed)
Elevated reading in office, largely averaging well controlled at home - will continue off medication at this time. I did ask him to continue monitoring BP at home and notify me if consistently >140/90 to restart amlodipine.

## 2022-05-11 NOTE — Assessment & Plan Note (Addendum)
Statin started 06/2021, dose dropped 02/2022, came off med 04/2022.  Reviewed ASCVD - desires to stay off statin at this time. Recommend retesting cholesterol levels at 6 month follow up visit.  The 10-year ASCVD risk score (Arnett DK, et al., 2019) is: 18.8%   Values used to calculate the score:     Age: 71 years     Sex: Male     Is Non-Hispanic African American: No     Diabetic: No     Tobacco smoker: No     Systolic Blood Pressure: 035 mmHg     Is BP treated: No     HDL Cholesterol: 53.4 mg/dL     Total Cholesterol: 206 mg/dL

## 2022-05-11 NOTE — Patient Instructions (Addendum)
Pass by lab to pick up stool kit today.  Bring [place] a copy of your advanced directive when completed.  Ok to stay off atorvastatin - continue healthy diet choices to control cholesterol (good fiber and legumes in the diet).  Right hand xray today  Return as needed or in 6 months for follow up visit.   Health Maintenance After Age 72 After age 81, you are at a higher risk for certain long-term diseases and infections as well as injuries from falls. Falls are a major cause of broken bones and head injuries in people who are older than age 102. Getting regular preventive care can help to keep you healthy and well. Preventive care includes getting regular testing and making lifestyle changes as recommended by your health care provider. Talk with your health care provider about: Which screenings and tests you should have. A screening is a test that checks for a disease when you have no symptoms. A diet and exercise plan that is right for you. What should I know about screenings and tests to prevent falls? Screening and testing are the best ways to find a health problem early. Early diagnosis and treatment give you the best chance of managing medical conditions that are common after age 77. Certain conditions and lifestyle choices may make you more likely to have a fall. Your health care provider may recommend: Regular vision checks. Poor vision and conditions such as cataracts can make you more likely to have a fall. If you wear glasses, make sure to get your prescription updated if your vision changes. Medicine review. Work with your health care provider to regularly review all of the medicines you are taking, including over-the-counter medicines. Ask your health care provider about any side effects that may make you more likely to have a fall. Tell your health care provider if any medicines that you take make you feel dizzy or sleepy. Strength and balance checks. Your health care provider may recommend  certain tests to check your strength and balance while standing, walking, or changing positions. Foot health exam. Foot pain and numbness, as well as not wearing proper footwear, can make you more likely to have a fall. Screenings, including: Osteoporosis screening. Osteoporosis is a condition that causes the bones to get weaker and break more easily. Blood pressure screening. Blood pressure changes and medicines to control blood pressure can make you feel dizzy. Depression screening. You may be more likely to have a fall if you have a fear of falling, feel depressed, or feel unable to do activities that you used to do. Alcohol use screening. Using too much alcohol can affect your balance and may make you more likely to have a fall. Follow these instructions at home: Lifestyle Do not drink alcohol if: Your health care provider tells you not to drink. If you drink alcohol: Limit how much you have to: 0-1 drink a day for women. 0-2 drinks a day for men. Know how much alcohol is in your drink. In the [place] one drink equals one 12 oz bottle of beer (355 mL), one 5 oz glass of wine (148 mL), or one 1 oz glass of hard liquor (44 mL). Do not use any products that contain nicotine or tobacco. These products include cigarettes, chewing tobacco, and vaping devices, such as e-cigarettes. If you need help quitting, ask your health care provider. Activity  Follow a regular exercise program to stay fit. This will help you maintain your balance. Ask your health care provider what  types of exercise are appropriate for you. If you need a cane or walker, use it as recommended by your health care provider. Wear supportive shoes that have nonskid soles. Safety  Remove any tripping hazards, such as rugs, cords, and clutter. Install safety equipment such as grab bars in bathrooms and safety rails on stairs. Keep rooms and walkways well-lit. General instructions Talk with your health care provider about your  risks for falling. Tell your health care provider if: You fall. Be sure to tell your health care provider about all falls, even ones that seem minor. You feel dizzy, tiredness (fatigue), or off-balance. Take over-the-counter and prescription medicines only as told by your health care provider. These include supplements. Eat a healthy diet and maintain a healthy weight. A healthy diet includes low-fat dairy products, low-fat (lean) meats, and fiber from whole grains, beans, and lots of fruits and vegetables. Stay current with your vaccines. Schedule regular health, dental, and eye exams. Summary Having a healthy lifestyle and getting preventive care can help to protect your health and wellness after age 21. Screening and testing are the best way to find a health problem early and help you avoid having a fall. Early diagnosis and treatment give you the best chance for managing medical conditions that are more common for people who are older than age 64. Falls are a major cause of broken bones and head injuries in people who are older than age 86. Take precautions to prevent a fall at home. Work with your health care provider to learn what changes you can make to improve your health and wellness and to prevent falls. This information is not intended to replace advice given to you by your health care provider. Make sure you discuss any questions you have with your health care provider. Document Revised: 04/13/2021 Document Reviewed: 04/13/2021 Elsevier Patient Education  [place].

## 2022-05-11 NOTE — Assessment & Plan Note (Addendum)
Continues lifelong anticoagulation due to recurrent PE (acute on chronic) 02/2022

## 2022-05-11 NOTE — Assessment & Plan Note (Signed)
Advanced planning - does not have this set up. Working with Chief Executive Officer. Planning on setting up, to see lawyer. Wife would be HCPOA. Packet previously provided.

## 2022-05-11 NOTE — Assessment & Plan Note (Signed)
Preventative protocols reviewed and updated unless pt declined. Discussed healthy diet and lifestyle.  

## 2022-05-13 ENCOUNTER — Other Ambulatory Visit (INDEPENDENT_AMBULATORY_CARE_PROVIDER_SITE_OTHER): Payer: PPO

## 2022-05-13 DIAGNOSIS — Z1211 Encounter for screening for malignant neoplasm of colon: Secondary | ICD-10-CM | POA: Diagnosis not present

## 2022-05-13 LAB — FECAL OCCULT BLOOD, GUAIAC: Fecal Occult Blood: NEGATIVE

## 2022-05-14 ENCOUNTER — Telehealth: Payer: Self-pay

## 2022-05-14 ENCOUNTER — Other Ambulatory Visit: Payer: PPO

## 2022-05-14 ENCOUNTER — Telehealth: Payer: Self-pay | Admitting: Oncology

## 2022-05-14 DIAGNOSIS — Z1211 Encounter for screening for malignant neoplasm of colon: Secondary | ICD-10-CM

## 2022-05-14 LAB — FECAL OCCULT BLOOD, IMMUNOCHEMICAL: Fecal Occult Bld: NEGATIVE

## 2022-05-14 NOTE — Telephone Encounter (Signed)
Per Anda Kraft (x-ray), [facility] called requesting order for iFOB.

## 2022-05-14 NOTE — Telephone Encounter (Signed)
Done

## 2022-05-14 NOTE — Telephone Encounter (Signed)
.  Called patient to schedule appointment per 5/16 los, patient is aware of date and time.

## 2022-05-14 NOTE — Addendum Note (Signed)
Addended by: [name] on: 05/14/2022 03:28 PM   Modules accepted: Orders

## 2022-05-15 DIAGNOSIS — S6991XA Unspecified injury of right wrist, hand and finger(s), initial encounter: Secondary | ICD-10-CM | POA: Insufficient documentation

## 2022-05-15 NOTE — Assessment & Plan Note (Addendum)
LFTs remain normal.  Check %sat/ferritin next labwork

## 2022-05-15 NOTE — Assessment & Plan Note (Signed)
Concern for fracture given point tenderness and marked swelling present 2 wks after injury - check hand films.

## 2022-05-15 NOTE — Assessment & Plan Note (Addendum)
Possible substance induced manic episode s/p [facility] psychiatry evaluation. Thought Effexor related - avoid SSRI/SNRIs. Consider contribution of leuprolide. Currently managed with seroquel '200mg'$  nightly with benefit in sleep, planned close psych f/u

## 2022-05-17 ENCOUNTER — Encounter: Payer: Self-pay | Admitting: Family Medicine

## 2022-05-20 ENCOUNTER — Telehealth: Payer: PPO

## 2022-05-28 ENCOUNTER — Other Ambulatory Visit (HOSPITAL_COMMUNITY): Payer: Self-pay

## 2022-06-03 ENCOUNTER — Other Ambulatory Visit (HOSPITAL_COMMUNITY): Payer: Self-pay

## 2022-06-07 ENCOUNTER — Encounter: Payer: Self-pay | Admitting: Oncology

## 2022-06-07 DIAGNOSIS — R454 Irritability and anger: Secondary | ICD-10-CM | POA: Diagnosis not present

## 2022-06-07 DIAGNOSIS — F22 Delusional disorders: Secondary | ICD-10-CM | POA: Diagnosis not present

## 2022-06-07 DIAGNOSIS — G4700 Insomnia, unspecified: Secondary | ICD-10-CM | POA: Diagnosis not present

## 2022-06-17 ENCOUNTER — Telehealth: Payer: PPO

## 2022-06-25 ENCOUNTER — Other Ambulatory Visit (HOSPITAL_COMMUNITY): Payer: Self-pay

## 2022-06-28 ENCOUNTER — Other Ambulatory Visit (HOSPITAL_COMMUNITY): Payer: Self-pay

## 2022-07-05 ENCOUNTER — Other Ambulatory Visit (HOSPITAL_COMMUNITY): Payer: Self-pay

## 2022-07-08 ENCOUNTER — Other Ambulatory Visit (HOSPITAL_COMMUNITY): Payer: Self-pay

## 2022-07-15 DIAGNOSIS — H25813 Combined forms of age-related cataract, bilateral: Secondary | ICD-10-CM | POA: Diagnosis not present

## 2022-07-22 ENCOUNTER — Telehealth: Payer: Self-pay

## 2022-07-22 NOTE — Chronic Care Management (AMB) (Addendum)
    Chronic Care Management Pharmacy Assistant   Name: Shane [name]  MRN: [id number] DOB: 1951-02-02   Reason for Encounter: Reminder Call   Conditions to be addressed/monitored: CAD, HTN, and HLD  Medications: Outpatient Encounter Medications as of 07/22/2022  Medication Sig   abiraterone acetate (ZYTIGA) 250 MG tablet Take 4 tablets (1,000 mg total) by mouth daily. Take on an empty stomach 1 hour before or 2 hours after a meal   apixaban (ELIQUIS) 5 MG TABS tablet Take 1 tablet (5 mg total) by mouth 2 (two) times daily.   Calcium-Magnesium-Vitamin D (CITRACAL SLOW RELEASE PO) Take 1 tablet by mouth daily.   Leuprolide Acetate, 4 Month, (ELIGARD) 30 MG injection Inject 30 mg into the skin every 4 (four) months.   predniSONE (DELTASONE) 5 MG tablet TAKE ONE TABLET BY MOUTH DAILY WITH BREAKFAST   QUEtiapine (SEROQUEL) 100 MG tablet Take 2 tablets (200 mg total) by mouth at bedtime.   No facility-administered encounter medications on file as of 07/22/2022.   Shane [name] was contacted to remind of upcoming telephone visit with [name] on 07/27/22 at 11:00am. Patient was reminded to have any blood glucose and blood pressure readings available for review at appointment.   Message was left reminding patient of appointment. Patient returned call and confirmed appointment . Counseled patient on purpose of visit with CPP.   CCM referral has been placed prior to visit?  No   Star Rating Drugs: Medication:  Last Fill: Day Supply No star medications identified   Eliquis   06/09/22  [place], CPP notified  [name], [place]  [phone]

## 2022-07-26 ENCOUNTER — Other Ambulatory Visit (HOSPITAL_COMMUNITY): Payer: Self-pay

## 2022-07-27 ENCOUNTER — Ambulatory Visit: Payer: PPO | Admitting: Pharmacist

## 2022-07-27 DIAGNOSIS — I700 Atherosclerosis of aorta: Secondary | ICD-10-CM

## 2022-07-27 DIAGNOSIS — E7800 Pure hypercholesterolemia, unspecified: Secondary | ICD-10-CM

## 2022-07-27 DIAGNOSIS — Z86711 Personal history of pulmonary embolism: Secondary | ICD-10-CM

## 2022-07-27 DIAGNOSIS — I10 Essential (primary) hypertension: Secondary | ICD-10-CM

## 2022-07-27 NOTE — Progress Notes (Signed)
Chronic Care Management Pharmacy Note  08/05/2022 Name:  Shane [name] MRN:  [id number] DOB:  [birth year]  Summary: CCM F/U visit -Reviewed medications; pt affirms compliance and denies issues -BP is at goal < 130/80 without medications -Pt may benefit from statin for aortic atherosclerosis, but prefers to remain off statin at this time  Recommendations/Changes made from today's visit: -No med changes  Plan: -Transition CCM to Self Care: Patient achieved CCM goals and no longer needs to be contacted as frequently. The patient has been provided with contact information for the care management team and has been advised to call with any health related questions or concerns.     Subjective: Shane [name] is an 71 y.o. year old male who is a primary patient of [name], MD.  The CCM team was consulted for assistance with disease management and care coordination needs.    Engaged with patient by telephone for follow up visit in response to provider referral for pharmacy case management and/or care coordination services.   Consent to Services:  The patient was given information about Chronic Care Management services, agreed to services, and gave verbal consent prior to initiation of services.  Please see initial visit note for detailed documentation.   Patient Care Team: [name], MD as PCP - General (Family Medicine) Cira Rue, RN Nurse Navigator as Registered Nurse (Medical Oncology) Charlton Haws, [facility] as Pharmacist (Pharmacist)  Recent office visits: 05/11/22 Dr Hoyle Barr: annual exam -  04/22/22 Dr Gwenlyn Found - urgent psych referral locally (denied [facility] psych) 04/12/22 Dr Gwenlyn Found - d/w psychiatrist, concern for drug-induced hypomania. Trial Lunesta for sleep cycle. Rec MRI brian. 03/30/22 Dr Gwenlyn Found - c/o venlafaxine withdrawal. Start Duloxtine 20 mg x 2 weeks as bridge. Also Rx hydroxyzine 25 mg PRN for anxiety, sleep.  02/16/22 Dr Danise Mina OV: hospital f/u; started  amlodipine 5 mg for high BP. Reduced atorvastatin to 10 mg. Reduced venlafaxine to 75 mg. 10/27/21-PCP-[name],MD-Telemedicine-Patient presented for body aches, fever, cough,positive covid home test. [name] x5, latest bivalent booster 09/2021 )Encouraged fluids and rest. Reviewed further supportive care measures at home including vit C 512m bid, vit D 2000 IU daily, zinc 1036mdaily, tylenol PRN, pepcid 2042mID PRN.  Start Molnupiravir 200m73mke 4 capsules  2 times daily for 5 days  08/27/21-Family Medicine-[name],MD- Patient presented for possible blood clot.Patient already on blood thinner, use heat,massage to area, use tylenol,ER  if symptoms worsen. 06/30/21-PCP-[name],MD-Patient presented for follow up hospital stay. Start atorvastatin 20mg31me 1 tablet daily  , just started Eliquis 5mg  19me 1 tablet 2 times daily. Referral to ophthalmology,referral for carotid ultrasound.  Recent consult visits: 12/31/21 Dr ShadadAlen Blewlogy): f/u prostate cancer - completed 6 mos AC for PE. Rec D/C Eliquis. 09/03/21-Oncology-[name],MD-Patient presented for follow up prostate cancer. Discussed PE seen on previous scans, stable. Recommended calcium and vitamin D supplements. Eligard 30mg g27m. 08/11/21-Urology-[name],-no data found 07/22/21-Cardiology-[name],MD-no data found  Hospital visits: Medication Reconciliation was completed by comparing discharge summary, patient's EMR and Pharmacy list, and upon discussion with patient.  ED visit ([facility]) [facility] - acute PE. Restart Eliquis   Admitted to the ED on 06/25/21 due to Pleuritic chest pain. Discharge date was 06/25/21. [facility] Dis[facility] date was [facility]24[facility]21[facility]   Objective:  Lab Results  Component Value Date   CREATININE 1.25 (  H) 04/29/2022   BUN 20 04/29/2022   GFR 59.39 (L) 04/27/2021   GFRNONAA >60  04/29/2022   GFRAA >60 09/03/2020   NA 142 04/29/2022   K 3.6 04/29/2022   CALCIUM 9.4 04/29/2022   CO2 29 04/29/2022   GLUCOSE 77 04/29/2022    Lab Results  Component Value Date/Time   GFR 59.39 (L) 04/27/2021 07:29 AM   GFR 58.22 (L) 04/24/2020 07:41 AM    Last diabetic Eye exam: No results found for: "HMDIABEYEEXA"  Last diabetic Foot exam: No results found for: "HMDIABFOOTEX"   Lab Results  Component Value Date   CHOL 206 (H) 05/04/2022   HDL 53.40 05/04/2022   LDLCALC 131 (H) 05/04/2022   TRIG 108.0 05/04/2022   CHOLHDL 4 05/04/2022       Latest Ref Rng & Units 04/29/2022    8:59 AM 02/12/2022    8:05 AM 12/31/2021   10:02 AM  Hepatic Function  Total Protein 6.5 - 8.1 g/dL 6.5  6.6  7.4   Albumin 3.5 - 5.0 g/dL 4.0  3.8  4.5   AST 15 - 41 U/L 20  26  20    ALT 0 - 44 U/L 15  23  16    Alk Phosphatase 38 - 126 U/L 52  44  50   Total Bilirubin 0.3 - 1.2 mg/dL 0.8  0.7  0.8     Lab Results  Component Value Date/Time   TSH 2.51 05/04/2022 07:37 AM   TSH 1.76 07/30/2015 08:07 AM       Latest Ref Rng & Units 04/29/2022    8:59 AM 02/12/2022    3:56 AM 02/11/2022    4:39 PM  CBC  WBC 4.0 - 10.5 K/uL 4.7  8.4  8.8   Hemoglobin 13.0 - 17.0 g/dL 12.3  12.8  14.2   Hematocrit 39.0 - 52.0 % 36.5  37.5  43.1   Platelets 150 - 400 K/uL 178  212  238     No results found for: "VD25OH"  Clinical ASCVD: Yes  The 10-year ASCVD risk score (Arnett DK, et al., 2019) is: 20.1%   Values used to calculate the score:     Age: 21 years     Sex: Male     Is Non-Hispanic African American: No     Diabetic: No     Tobacco smoker: No     Systolic Blood Pressure: 263 mmHg     Is BP treated: No     HDL Cholesterol: 53.4 mg/dL     Total Cholesterol: 206 mg/dL       05/07/2022    1:14 PM 05/07/2022    1:13 PM 05/06/2021    9:56 AM  Depression screen PHQ 2/9  Decreased Interest 0 0 0  Down, Depressed, Hopeless 0 0 0  PHQ - 2 Score 0 0 0     Social History   Tobacco Use   Smoking Status Never  Smokeless Tobacco Never   BP Readings from Last 3 Encounters:  05/11/22 127/69  04/29/22 140/66  02/16/22 (!) 162/88   Pulse Readings from Last 3 Encounters:  05/11/22 65  04/29/22 67  02/16/22 69   Wt Readings from Last 3 Encounters:  05/11/22 193 lb 2 oz (87.6 kg)  04/29/22 195 lb 3.2 oz (88.5 kg)  02/16/22 200 lb 6 oz (90.9 kg)   BMI Readings from Last 3 Encounters:  05/11/22 27.51 kg/m  04/29/22 26.47 kg/m  02/16/22 27.18 kg/m    Assessment/Interventions:  Review of patient past medical history, allergies, medications, health status, including review of consultants reports, laboratory and other test data, was performed as part of comprehensive evaluation and provision of chronic care management services.   SDOH:  (Social Determinants of Health) assessments and interventions performed: No - done 05/2022   SDOH Screenings   Alcohol Screen: Low Risk  (05/07/2022)   Alcohol Screen    Last Alcohol Screening Score (AUDIT): 1  Depression (PHQ2-9): Low Risk  (05/07/2022)   Depression (PHQ2-9)    PHQ-2 Score: 0  Financial Resource Strain: Low Risk  (05/07/2022)   Overall Financial Resource Strain (CARDIA)    Difficulty of Paying Living Expenses: Not hard at all  Food Insecurity: No Food Insecurity (05/07/2022)   Hunger Vital Sign    Worried About Running Out of Food in the Last Year: Never true    Ran Out of Food in the Last Year: Never true  Housing: Low Risk  (05/07/2022)   Housing    Last Housing Risk Score: 0  Physical Activity: Sufficiently Active (05/07/2022)   Exercise Vital Sign    Days of Exercise per Week: 5 days    Minutes of Exercise per Session: 60 min  Social Connections: Moderately Integrated (05/07/2022)   Social Connection and Isolation Panel [NHANES]    Frequency of Communication with Friends and Family: Three times a week    Frequency of Social Gatherings with Friends and Family: Three times a week    Attends Religious Services: 1 to 4  times per year    Active Member of Clubs or Organizations: No    Attends Archivist Meetings: Never    Marital Status: Married  Stress: No Stress Concern Present (05/07/2022)   [place]    Feeling of Stress : Not at all  Tobacco Use: Low Risk  (05/11/2022)   Patient History    Smoking Tobacco Use: Never    Smokeless Tobacco Use: Never    Passive Exposure: Not on file  Transportation Needs: No Transportation Needs (05/07/2022)   PRAPARE - Transportation    Lack of Transportation (Medical): No    Lack of Transportation (Non-Medical): No    CCM Care Plan  Allergies  Allergen Reactions   Effexor Xr [Venlafaxine Hcl Er] Other (See Comments)    Psychiatric symptoms   Bactrim [Sulfamethoxazole-Trimethoprim] Hives    Red rash, hives   Hydrocodone    Percocet [Oxycodone-Acetaminophen] Other (See Comments)    TURNS RED FROM CHEST UP, LOOKS LIKE SUNBURN AND STARTS TO ITCH BADLY   Vancomycin    Penicillins Swelling and Rash    Did it involve swelling of the face/tongue/throat, SOB, or low BP? No Did it involve sudden or severe rash/hives, skin peeling, or any reaction on the inside of your mouth or nose? No Did you need to seek medical attention at a hospital or doctor's office? No When did it last happen?      50 years ago If all above answers are "NO", may proceed with cephalosporin use.    Zithromax [Azithromycin] Rash    Medications Reviewed Today     Reviewed by Charlton Haws, [facility] (Pharmacist) on 07/27/22 at 1117  Med List Status: <None>   Medication Order Taking? Sig Documenting Provider Last Dose Status Informant  abiraterone acetate (ZYTIGA) 250 MG tablet [id number] Yes Take 4 tablets (1,000 mg total) by mouth daily. Take on an empty stomach 1 hour before or 2 hours after a  meal [name], MD Taking Active Self, Pharmacy Records  apixaban [facility]) 5 MG TABS tablet [id number] Yes Take 1 tablet  (5 mg total) by mouth 2 (two) times daily. [name], MD Taking Active   Calcium-Magnesium-Vitamin D (CITRACAL SLOW RELEASE PO) [id number] Yes Take 1 tablet by mouth daily. [provider] Taking Active Self, Pharmacy Records  Leuprolide Acetate, 4 Month, (ELIGARD) 30 MG injection [id number] Yes Inject 30 mg into the skin every 4 (four) months. [provider] Taking Active Self, Pharmacy Records  predniSONE (DELTASONE) 5 MG tablet [id number] Yes TAKE ONE TABLET BY MOUTH DAILY WITH BREAKFAST Alen Blew Mathis Dad, MD Taking Active Self, Pharmacy Records           Med Note Charlton Haws   Tue Apr 27, 2022  9:08 AM)    QUEtiapine (SEROQUEL) 100 MG tablet [id number] Yes Take 2 tablets (200 mg total) by mouth at bedtime. [name], MD Taking Active   Med List Note Darl Pikes, RPH-CPP 12/13/19 1147): Zytiga filled [place]            Patient Active Problem List   Diagnosis Date Noted   Hand injury, right, initial encounter 05/15/2022   Substance or medication-induced bipolar and related disorder ([place]) 02/16/2022   COVID-19 virus infection 10/27/2021   Hypertension 10/27/2021   Loss of peripheral visual field, bilateral 07/16/2021   Vision loss of right eye 06/30/2021   History of pulmonary embolism 06/24/2021   Atherosclerosis of aorta ([place]) 05/06/2021   CAD (coronary artery disease) 05/06/2021   History of DVT of lower extremity 02/25/2020   Hydronephrosis 02/13/2020   Loss of taste 01/31/2020   Recurrent UTI 12/31/2019   Malignant neoplasm of prostate ([place]) 08/17/2019   HLD (hyperlipidemia) 08/02/2018   Health maintenance examination 08/05/2017   Anosmia 08/05/2017   Family history of hemochromatosis 08/03/2016   Medicare annual wellness visit, subsequent 07/29/2015   Advanced care planning/counseling discussion 07/29/2015   Bradycardia 07/29/2015   History of cardiac monitoring 07/29/2015   Syncopal episodes      Immunization History  Administered Date(s) Administered   Influenza, High Dose Seasonal PF 10/02/2021   Influenza-Unspecified 10/09/2019   PFIZER(Purple Top)SARS-COV-2 Vaccination 12/28/2019, 01/17/2020, 09/25/2020, 04/20/2021   Pfizer Covid-19 Vaccine Bivalent Booster 40yr & up 10/02/2021   Pneumococcal Conjugate-13 08/03/2016   Pneumococcal Polysaccharide-23 08/05/2017   Tdap 02/02/2017   Zoster Recombinat (Shingrix) 10/09/2019, 03/25/2020    Conditions to be addressed/monitored:  Hyperlipidemia and Coronary Artery Disease, Prostate cancer, Hot flashes, Hx PE  Care Plan : CPiedra Aguza Updates made by FCharlton Haws [place]since 08/05/2022 12:00 AM     Problem: Hyperlipidemia and Coronary Artery Disease, Prostate cancer, Hot flashes, Hx PE   Priority: High     Long-Range Goal: Disease mgmt   Start Date: 11/20/2021  Expected End Date: 08/05/2022  This Visit's Progress: On track  Recent Progress: On track  Priority: High  Note:   Current Barriers:  None identified  Pharmacist Clinical Goal(s):  Patient will contact provider office for questions/concerns as evidenced notation of same in electronic health record through collaboration with PharmD and provider.   Interventions: 1:1 collaboration with GRia Bush MD regarding development and update of comprehensive plan of care as evidenced by provider attestation and co-signature Inter-disciplinary care team collaboration (see longitudinal plan of care) Comprehensive medication review performed; medication list updated in electronic medical record  Elevated BP readings (BP goal <140/90) -Controlled - pt stopped taking amlodipine  and BP has been normal at home -Current home readings: 115-125/70s -Current treatment: None -Educated on BP goals and benefits of medications for prevention of heart attack, stroke and kidney damage; Importance of home blood pressure monitoring; -Counseled to monitor BP at  home daily; given controlled BP at home is it reasonable to continue off of medications for now, advised pt to call if he starts to see readings > 140/90 consistently  Hyperlipidemia: (LDL goal < 70) -Not ideally controlled - LDL 131 (04/2022) was above goal given Aortic atherosclerosis on CT 06/2021; pt stopped taking atorvastatin recently while he is going through significant psychiatric stress - he wants to avoid any medications he can, he wants to wait until repeat lipid panel to decide on statin therapy -Current treatment: None -Educated on Cholesterol goals; Benefits of statin for ASCVD risk reduction; -Pt prefers to remain off statin despite potential benefits  Mental health (Goal: manage symptoms) -Not ideally controlled, improving -Pt reports irritability, flashbacks started a few months ago following PE and venlafaxine does increase (up to 150 mg); he is also on chronic prednisone and Zytiga for prostate cancer that can contribute to these symptoms as well -Pt established with [facility] (Dr Cheryl Flash) 04/26/22; he has been on quetiapine for a few days and reports it has been helping with sleep -Current treatment: Quetiapine 100 mg - 1.5 tab HS - Appropriate, Effective, Safe, Accessible -Medications previously tried/failed: venlafaxine, duloxetine, Lunesta, hydroxyzine -Discussed role of venlafaxine in recent mental health exacerbation - irritability/agitation likely also influenced by chronic prednisone -Recommend to continue current medication  Hx Prostate cancer (Goal: remission) -Controlled  -Hx castration-sensitive prostate cancer Dx 06/2019, 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 Currently in remission -Current treatment  Abiraterone (Zytiga) 250 mg - 4 tab daily empty stomach -Appropriate, Effective, Safe, Accessible Leuprolide (Eligard) 30 mg injection q4 months Appropriate, Effective, Safe, Accessible Prednisone 5 mg daily -Appropriate, Effective, Safe, Accessible -Discussed  anti-cancer agents may also have played a role in recent mental health issues, particularly irritability/agitation with prednisone -Recommended to continue current medication  Recurrent VTE (Goal: prevent VTE) -Controlled  -Hx PE 2020 (in s/o cancer dx, surgery), acute PE 06/2021 (in s/o positive Covid-19). Eliquis was stopped 12/31/21 after 6 months of treatment for previous PE, pt had recurrent PE 02/11/22 and was restarted on Eliquis; at this point indefinite anticoagulation is recommended -Current treatment  Eliquis 5 mg BID - Appropriate, Effective, Safe, Accessible -Medications previously tried: Xarelto  -Recommended to continue current medication  Patient Goals/Self-Care Activities Patient will:  - take medications as prescribed as evidenced by patient report and record review focus on medication adherence by routine check blood pressure periodically, document, and provide at future appointments      Medication Assistance:  Zytiga - [employer] grants through oncology  Compliance/Adherence/Medication fill history: Care Gaps: None  Star-Rating Drugs: Atorvastatin 20 mg - PDC 100%; currently on hold however  Medication Access: Within the past 30 days, how often has patient missed a dose of medication? 0 Is a pillbox or other method used to improve adherence? No  Factors that may affect medication adherence? adverse effects of medications Are meds synced by current pharmacy? No  Are meds delivered by current pharmacy? No  Does patient experience delays in picking up medications due to transportation concerns? No   [facility] Services Reviewed: Is patient disadvantaged to use [facility]?: No  Current Rx insurance plan: HTA Name and location of Current pharmacy:  [facility] [id number] - [place], [place] - [place] [address]  [place] [address] Phone: [phone] Fax: [phone]  [facility] services reviewed with patient today?: No   Patient requests to transfer care to [facility] Pharmacy?: No  Reason patient declined to change pharmacies: Loyalty to other pharmacy/Patient preference   Care Plan and Follow Up Patient Decision:  Patient agrees to Care Plan and Follow-up.  Plan: The patient has been provided with contact information for the care management team and has been advised to call with any health related questions or concerns.   [name], PharmD, BCACP Clinical Pharmacist [facility] at [facility] [phone]

## 2022-08-04 ENCOUNTER — Other Ambulatory Visit (HOSPITAL_COMMUNITY): Payer: Self-pay

## 2022-08-05 NOTE — Patient Instructions (Addendum)
Visit Information  Phone number for Pharmacist: [phone]   Goals Addressed   None     Care Plan : [place]  Updates made by Charlton Haws, [facility] since 08/05/2022 12:00 AM     Problem: Hyperlipidemia and Coronary Artery Disease, Prostate cancer, Hot flashes, Hx PE   Priority: High     Long-Range Goal: Disease mgmt   Start Date: 11/20/2021  Expected End Date: 08/05/2022  This Visit's Progress: On track  Recent Progress: On track  Priority: High  Note:   Current Barriers:  None identified  Pharmacist Clinical Goal(s):  Patient will contact provider office for questions/concerns as evidenced notation of same in electronic health record through collaboration with PharmD and provider.   Interventions: 1:1 collaboration with [name], MD regarding development and update of comprehensive plan of care as evidenced by provider attestation and co-signature Inter-disciplinary care team collaboration (see longitudinal plan of care) Comprehensive medication review performed; medication list updated in electronic medical record  Elevated BP readings (BP goal <140/90) -Controlled - pt stopped taking amlodipine and BP has been normal at home -Current home readings: 115-125/70s -Current treatment: None -Educated on BP goals and benefits of medications for prevention of heart attack, stroke and kidney damage; Importance of home blood pressure monitoring; -Counseled to monitor BP at home daily; given controlled BP at home is it reasonable to continue off of medications for now, advised pt to call if he starts to see readings > 140/90 consistently  Hyperlipidemia: (LDL goal < 70) -Not ideally controlled - LDL 131 (04/2022) was above goal given Aortic atherosclerosis on CT 06/2021; pt stopped taking atorvastatin recently while he is going through significant psychiatric stress - he wants to avoid any medications he can, he wants to wait until repeat lipid panel to  decide on statin therapy -Current treatment: None -Educated on Cholesterol goals; Benefits of statin for ASCVD risk reduction; -Pt prefers to remain off statin despite potential benefits  Mental health (Goal: manage symptoms) -Not ideally controlled, improving -Pt reports irritability, flashbacks started a few months ago following PE and venlafaxine does increase (up to 150 mg); he is also on chronic prednisone and Zytiga for prostate cancer that can contribute to these symptoms as well -Pt established with [facility] (Dr Cheryl Flash) 04/26/22; he has been on quetiapine for a few days and reports it has been helping with sleep -Current treatment: Quetiapine 100 mg - 1.5 tab HS - Appropriate, Effective, Safe, Accessible -Medications previously tried/failed: venlafaxine, duloxetine, Lunesta, hydroxyzine -Discussed role of venlafaxine in recent mental health exacerbation - irritability/agitation likely also influenced by chronic prednisone -Recommend to continue current medication  Hx Prostate cancer (Goal: remission) -Controlled  -Hx castration-sensitive prostate cancer Dx 06/2019, 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 Currently in remission -Current treatment  Abiraterone (Zytiga) 250 mg - 4 tab daily empty stomach -Appropriate, Effective, Safe, Accessible Leuprolide (Eligard) 30 mg injection q4 months Appropriate, Effective, Safe, Accessible Prednisone 5 mg daily -Appropriate, Effective, Safe, Accessible -Discussed anti-cancer agents may also have played a role in recent mental health issues, particularly irritability/agitation with prednisone -Recommended to continue current medication  Recurrent VTE (Goal: prevent VTE) -Controlled  -Hx PE 2020 (in s/o cancer dx, surgery), acute PE 06/2021 (in s/o positive Covid-19). Eliquis was stopped 12/31/21 after 6 months of treatment for previous PE, pt had recurrent PE 02/11/22 and was restarted on Eliquis; at this point indefinite anticoagulation is  recommended -Current treatment  Eliquis 5 mg BID - Appropriate, Effective, Safe, Accessible -  Medications previously tried: Xarelto  -Recommended to continue current medication  Patient Goals/Self-Care Activities Patient will:  - take medications as prescribed as evidenced by patient report and record review focus on medication adherence by routine check blood pressure periodically, document, and provide at future appointments      Patient verbalizes understanding of instructions and care plan provided today and agrees to view in [place]. Active MyChart status and patient understanding of how to access instructions and care plan via MyChart confirmed with patient.    The patient has been provided with contact information for the care management team and has been advised to call with any health related questions or concerns.   [name], PharmD, BCACP Clinical Pharmacist [place] [facility] at [facility] [phone]

## 2022-08-06 ENCOUNTER — Other Ambulatory Visit: Payer: Self-pay | Admitting: Oncology

## 2022-08-19 ENCOUNTER — Other Ambulatory Visit (HOSPITAL_COMMUNITY): Payer: Self-pay

## 2022-08-20 ENCOUNTER — Other Ambulatory Visit (HOSPITAL_COMMUNITY): Payer: Self-pay

## 2022-08-31 ENCOUNTER — Other Ambulatory Visit: Payer: Self-pay

## 2022-08-31 ENCOUNTER — Inpatient Hospital Stay: Payer: PPO | Admitting: Oncology

## 2022-08-31 ENCOUNTER — Inpatient Hospital Stay: Payer: PPO | Attending: Oncology

## 2022-08-31 ENCOUNTER — Inpatient Hospital Stay: Payer: PPO

## 2022-08-31 VITALS — BP 147/69 | HR 70 | Temp 97.8°F | Resp 18 | Ht 70.25 in | Wt 202.2 lb

## 2022-08-31 DIAGNOSIS — C61 Malignant neoplasm of prostate: Secondary | ICD-10-CM

## 2022-08-31 DIAGNOSIS — C7951 Secondary malignant neoplasm of bone: Secondary | ICD-10-CM | POA: Diagnosis not present

## 2022-08-31 DIAGNOSIS — Z5111 Encounter for antineoplastic chemotherapy: Secondary | ICD-10-CM | POA: Insufficient documentation

## 2022-08-31 LAB — CBC WITH DIFFERENTIAL (CANCER CENTER ONLY)
Abs Immature Granulocytes: 0.07 10*3/uL (ref 0.00–0.07)
Basophils Absolute: 0 10*3/uL (ref 0.0–0.1)
Basophils Relative: 1 %
Eosinophils Absolute: 0.4 10*3/uL (ref 0.0–0.5)
Eosinophils Relative: 7 %
HCT: 37.5 % — ABNORMAL LOW (ref 39.0–52.0)
Hemoglobin: 12.8 g/dL — ABNORMAL LOW (ref 13.0–17.0)
Immature Granulocytes: 2 %
Lymphocytes Relative: 23 %
Lymphs Abs: 1.1 10*3/uL (ref 0.7–4.0)
MCH: 33.2 pg (ref 26.0–34.0)
MCHC: 34.1 g/dL (ref 30.0–36.0)
MCV: 97.2 fL (ref 80.0–100.0)
Monocytes Absolute: 0.4 10*3/uL (ref 0.1–1.0)
Monocytes Relative: 8 %
Neutro Abs: 2.9 10*3/uL (ref 1.7–7.7)
Neutrophils Relative %: 59 %
Platelet Count: 178 10*3/uL (ref 150–400)
RBC: 3.86 MIL/uL — ABNORMAL LOW (ref 4.22–5.81)
RDW: 12.3 % (ref 11.5–15.5)
WBC Count: 4.8 10*3/uL (ref 4.0–10.5)
nRBC: 0 % (ref 0.0–0.2)

## 2022-08-31 LAB — CMP (CANCER CENTER ONLY)
ALT: 30 U/L (ref 0–44)
AST: 30 U/L (ref 15–41)
Albumin: 4.1 g/dL (ref 3.5–5.0)
Alkaline Phosphatase: 45 U/L (ref 38–126)
Anion gap: 6 (ref 5–15)
BUN: 21 mg/dL (ref 8–23)
CO2: 28 mmol/L (ref 22–32)
Calcium: 9.4 mg/dL (ref 8.9–10.3)
Chloride: 107 mmol/L (ref 98–111)
Creatinine: 1.33 mg/dL — ABNORMAL HIGH (ref 0.61–1.24)
GFR, Estimated: 57 mL/min — ABNORMAL LOW (ref >60)
Glucose, Bld: 105 mg/dL — ABNORMAL HIGH (ref 70–99)
Potassium: 3.5 mmol/L (ref 3.5–5.1)
Sodium: 141 mmol/L (ref 135–145)
Total Bilirubin: 0.8 mg/dL (ref 0.3–1.2)
Total Protein: 6.7 g/dL (ref 6.5–8.1)

## 2022-08-31 MED ORDER — LEUPROLIDE ACETATE (4 MONTH) 30 MG ~~LOC~~ KIT
30.0000 mg | PACK | Freq: Once | SUBCUTANEOUS | Status: AC
  Administered 2022-08-31: 30 mg via SUBCUTANEOUS
  Filled 2022-08-31: qty 30

## 2022-08-31 NOTE — Progress Notes (Signed)
Hematology and Oncology Follow Up Visit  Shane [name] [id number] [birth year] 71 y.o. 08/31/2022 8:10 AM [name], MDGutierrez, [name], MD   Principle Diagnosis: 71 year old man with prostate cancer diagnosed in 2020.  He was found to have castration-sensitive advanced disease with lymphadenopathy and positive bone scan, Gleason score of 5+4 = 9.   Prior Therapy:  He is status post TURBT and stent placement bilaterally the pathology showed Gleason score 5+4 = 9 prostate cancer invading into the bladder in 2020.  Current therapy:   Eligard 30 mg every 4 months.  Next injection will be given today and repeated in 4 months.  Zytiga 1000 mg daily with prednisone 5 mg started in September 2020.  Interim History: Mr. Bastin presents today for repeat evaluation.  Since last visit, he reports feeling well without any major complaints.  He has reported some fatigue and tiredness and hot flashes associated with Zytiga and Eligard.  He denies any nausea, vomiting or abdominal pain.  He denies any worsening edema or bone pain.       Medications: Updated on review. Current Outpatient Medications  Medication Sig Dispense Refill   abiraterone acetate (ZYTIGA) 250 MG tablet Take 4 tablets (1,000 mg total) by mouth daily. Take on an empty stomach 1 hour before or 2 hours after a meal 120 tablet 10   apixaban (ELIQUIS) 5 MG TABS tablet Take 1 tablet (5 mg total) by mouth 2 (two) times daily. 60 tablet 6   Calcium-Magnesium-Vitamin D (CITRACAL SLOW RELEASE PO) Take 1 tablet by mouth daily.     Leuprolide Acetate, 4 Month, (ELIGARD) 30 MG injection Inject 30 mg into the skin every 4 (four) months.     predniSONE (DELTASONE) 5 MG tablet TAKE ONE TABLET BY MOUTH DAILY WITH BREAKFAST 90 tablet 0   QUEtiapine (SEROQUEL) 100 MG tablet Take 150 mg by mouth at bedtime.     No current facility-administered medications for this visit.     Allergies:  Allergies  Allergen Reactions   Effexor Xr  [Venlafaxine Hcl Er] Other (See Comments)    Psychiatric symptoms   Bactrim [Sulfamethoxazole-Trimethoprim] Hives    Red rash, hives   Hydrocodone    Percocet [Oxycodone-Acetaminophen] Other (See Comments)    TURNS RED FROM CHEST UP, LOOKS LIKE SUNBURN AND STARTS TO ITCH BADLY   Vancomycin    Penicillins Swelling and Rash    Did it involve swelling of the face/tongue/throat, SOB, or low BP? No Did it involve sudden or severe rash/hives, skin peeling, or any reaction on the inside of your mouth or nose? No Did you need to seek medical attention at a hospital or doctor's office? No When did it last happen?      50 years ago If all above answers are "NO", may proceed with cephalosporin use.    Zithromax [Azithromycin] Rash        Physical Exam:      Blood pressure (!) 147/69, pulse 70, temperature 97.8 F (36.6 C), temperature source Temporal, resp. rate 18, height 5' 10.25" (1.784 m), weight 202 lb 3.2 oz (91.7 kg), SpO2 99 %.       ECOG: 0    General appearance: Comfortable appearing without any discomfort Head: Normocephalic without any trauma Oropharynx: Mucous membranes are moist and pink without any thrush or ulcers. Eyes: Pupils are equal and round reactive to light. Lymph nodes: No cervical, supraclavicular, inguinal or axillary lymphadenopathy.   Heart:regular rate and rhythm.  S1 and S2 without leg edema. Lung: Clear  without any rhonchi or wheezes.  No dullness to percussion. Abdomin: Soft, nontender, nondistended with good bowel sounds.  No hepatosplenomegaly. Musculoskeletal: No joint deformity or effusion.  Full range of motion noted. Neurological: No deficits noted on motor, sensory and deep tendon reflex exam. Skin: No petechial rash or dryness.  Appeared moist.             Lab Results: Lab Results  Component Value Date   WBC 4.7 04/29/2022   HGB 12.3 (L) 04/29/2022   HCT 36.5 (L) 04/29/2022   MCV 98.1 04/29/2022   PLT 178 04/29/2022    PSA 0.05 (L) 04/24/2020     Chemistry      Component Value Date/Time   NA 142 04/29/2022 0859   NA 139 02/18/2021 1236   K 3.6 04/29/2022 0859   CL 107 04/29/2022 0859   CO2 29 04/29/2022 0859   BUN 20 04/29/2022 0859   BUN 22 02/18/2021 1236   CREATININE 1.25 (H) 04/29/2022 0859   GLU 113 01/08/2020 0000      Component Value Date/Time   CALCIUM 9.4 04/29/2022 0859   ALKPHOS 52 04/29/2022 0859   AST 20 04/29/2022 0859   ALT 15 04/29/2022 0859   BILITOT 0.8 04/29/2022 0859         Impression and Plan:   71 year old with:   1.    Advanced prostate cancer with lymphadenopathy and positive bone scan diagnosed in July 2020.  He has castration-sensitive disease.   The natural course of this disease was reviewed at this time and treatment options were discussed.  Given his presentation of advanced disease therapy recommendation is to continue indefinitely.  I will update his staging scan in the next 3 to 4 months to determine the extent of his disease as he is interested in therapy de-escalation.  I explained to him that this strategy is unproven but will consider stopping Zytiga in the future if his PSA continues to be 0.  After discussion today, I will update his staging with a PSMA PET scan.  If he has a complete response and his PSA continues to be 0 Zytiga could be discontinued in the future.   2.    Bone directed therapy: He is to continue calcium and vitamin D supplements.  Delton See has been deferred based on his request.   3.  Pulmonary embolism: He will continue on Eliquis indefinitely.   4.  Androgen deprivation: He will receive Eligard today and repeated 4 months.  This will continue indefinitely.  Occasions weight gain, hot flashes and sexual dysfunction were reiterated.   5.  Hypertension: His blood pressure is mildly elevated I will continue to monitor on Zytiga.   6.  Follow-up: In 4 months for a follow-up.   30 minutes were dedicated to this visit.  The time  was spent on reviewing disease status, treatment choices and outlining future plan of care discussion.  Zola Button, MD 9/26/20238:10 AM

## 2022-09-01 ENCOUNTER — Other Ambulatory Visit (HOSPITAL_COMMUNITY): Payer: Self-pay

## 2022-09-01 ENCOUNTER — Telehealth: Payer: Self-pay | Admitting: *Deleted

## 2022-09-01 NOTE — Telephone Encounter (Signed)
PC to patient, informed him PA is not required for his PET scan, instructed patient to call Central Scheduling, phone number given - patient verbalizes understanding.

## 2022-09-02 ENCOUNTER — Telehealth: Payer: Self-pay

## 2022-09-02 LAB — PROSTATE-SPECIFIC AG, SERUM (LABCORP): Prostate Specific Ag, Serum: <0.1 ng/mL (ref 0.0–4.0)

## 2022-09-02 NOTE — Telephone Encounter (Signed)
-----   Message from [name], MD sent at 09/02/2022 10:18 AM EDT ----- Please let him know his PSA is still low

## 2022-09-02 NOTE — Telephone Encounter (Signed)
Pt advised with VU 

## 2022-09-10 ENCOUNTER — Encounter (HOSPITAL_COMMUNITY)
Admission: RE | Admit: 2022-09-10 | Discharge: 2022-09-10 | Disposition: A | Discharge status: Home / Self Care | Payer: PPO | Source: Ambulatory Visit | Attending: Oncology | Admitting: Oncology

## 2022-09-10 DIAGNOSIS — R911 Solitary pulmonary nodule: Secondary | ICD-10-CM | POA: Diagnosis not present

## 2022-09-10 DIAGNOSIS — C61 Malignant neoplasm of prostate: Secondary | ICD-10-CM | POA: Insufficient documentation

## 2022-09-10 MED ORDER — PIFLIFOLASTAT F 18 (PYLARIFY) INJECTION
9.0000 | Freq: Once | INTRAVENOUS | Status: AC
  Administered 2022-09-10: 8.76 via INTRAVENOUS

## 2022-09-13 ENCOUNTER — Other Ambulatory Visit: Payer: Self-pay | Admitting: Oncology

## 2022-09-13 DIAGNOSIS — C61 Malignant neoplasm of prostate: Secondary | ICD-10-CM

## 2022-09-13 NOTE — Progress Notes (Signed)
This is also the PET scan were personally reviewed and discussed with the patient.  He has no evidence of metastatic disease beyond the prostate with a PSA remains undetectable.  He still has some residual uptake within the prostate.  It is unclear of this represents residual disease versus false positive findings.  Given the increased intensity I tend to think that this is residual disease and could benefit from salvage radiation therapy.  I will make a referral to radiation oncology for evaluation.  And he has a complete response after radiation potentially systemic therapy could be de-escalated.

## 2022-09-20 NOTE — Progress Notes (Signed)
GU Location of Tumor / Histology:  Prostate Ca invading into the bladder  If Prostate Cancer, Gleason Score is (5 + 4) in 2020.  09/20/2022 Dr. Alen Blew NM PET ([facility]) Skull To Mid Thigh Prostate carcinoma with bone and lymph node metastasis. Diagnosis 2019. PSA less than 0.1. Androgen deprivation and chemotherapy ongoing.   FINDINGS: NECK No radiotracer activity in neck lymph nodes.   Incidental CT finding: None.   CHEST No radiotracer accumulation within mediastinal or hilar lymph nodes. No suspicious pulmonary nodules on the CT scan.   Incidental CT finding: 5 mm nodule in the RIGHT lower lobe (111/series 4) without radiotracer activity. Nodule stable over multiple comparison exams back to 02/13/2020. Findings consistent with benign nodule. No specific follow-up recommended.   ABDOMEN/PELVIS Prostate: Intense radiotracer activity within the prostate gland with SUV max equal 11.3. The near entirety of the gland is radiotracer avid. Gland is relatively small measuring 3.1 by 2.6 by 2.8 cm.   Lymph nodes: No abnormal radiotracer accumulation within pelvic or abdominal nodes.   Liver: No evidence of liver metastasis.   Incidental CT finding: Obstructing calculus in the LEFT kidney. Benign cyst of the RIGHT kidney.   SKELETON No focal activity to suggest skeletal metastasis. No lytic or blastic lesion identified CT   IMPRESSION: 1. Fairly intense radiotracer activity within the prostate gland. 2. No evidence metastatic adenopathy in the pelvis or periaortic retroperitoneum. 3. No evidence of visceral metastasis or skeletal metastasis. 4. Benign RIGHT lobe pulmonary nodule.  04/14/2022 Dr. Danise Mina MR Brain with or without Contrast CLINICAL DATA:  Psychosis, history of advanced prostate cancer  FINDINGS: Brain: There is no acute infarction or intracranial hemorrhage. There is no intracranial mass, mass effect, or edema. There is no hydrocephalus or extra-axial  fluid collection. Ventricles and sulci are within normal limits in size and configuration. Patchy T2 hyperintensity in the supratentorial white matter is nonspecific but could reflect minor chronic microvascular ischemic changes.  No abnormal enhancement.   Vascular: Major vessel flow voids at the skull base are preserved.   Skull and upper cervical spine: Normal marrow signal is preserved.   Sinuses/Orbits: Minor mucosal thickening.  Orbits are unremarkable.   Other: Sella is unremarkable.  Mastoid air cells are clear.   IMPRESSION: No evidence of recent infarction, hemorrhage, or mass. No abnormal enhancement.  Past/Anticipated interventions by urology, if any:  NA  Past/Anticipated interventions by medical oncology, if any:   NA  08/31/2022 Dr. Alen Blew Impression and Plan:   71 year old with:   1.  Advanced prostate cancer with lymphadenopathy and positive bone scan diagnosed in July 2020.  He has castration-sensitive disease.   The natural course of this disease was reviewed at this time and treatment options were discussed.  Given his presentation of advanced disease therapy recommendation is to continue indefinitely.  I will update his staging scan in the next 3 to 4 months to determine the extent of his disease as he is interested in therapy de-escalation.  I explained to him that this strategy is unproven but will consider stopping Zytiga in the future if his PSA continues to be 0.   After discussion today, I will update his staging with a PSMA PET scan.  If he has a complete response and his PSA continues to be 0 Zytiga could be discontinue in the future.    2.  Bone directed therapy: He is to continue calcium and vitamin D supplements.  Delton See has been deferred based on his request.  3.  Pulmonary embolism: He will continue on Eliquis indefinitely.   4.  Androgen deprivation: He will receive Eligard today and repeated 4 months.  This will continue indefinitely.  Occasions  weight gain, hot flashes and sexual dysfunction were reiterated.    5.  Hypertension: His blood pressure is mildly elevated I will continue to monitor on Zytiga.   6.  Follow-up: In 4 months follow up.   Weight changes, if any:  No  IPSS:  4 SHIM:  12  Bowel/Bladder complaints, if any:  No  Nausea/Vomiting, if any: No  Pain issues, if any:  0/10  SAFETY ISSUES: Prior radiation?  No Pacemaker/ICD?  No Possible current pregnancy?  Male Is the patient on methotrexate?  No  Current Complaints /other details:

## 2022-09-23 ENCOUNTER — Other Ambulatory Visit (HOSPITAL_COMMUNITY): Payer: Self-pay

## 2022-09-27 ENCOUNTER — Telehealth: Payer: Self-pay | Admitting: Pharmacy Technician

## 2022-09-27 ENCOUNTER — Other Ambulatory Visit (HOSPITAL_COMMUNITY): Payer: Self-pay

## 2022-09-27 ENCOUNTER — Encounter: Payer: Self-pay | Admitting: Oncology

## 2022-09-27 NOTE — Telephone Encounter (Signed)
Oral Oncology Patient Advocate Encounter   Was successful in placing patient on grant waitlist for PANF.  I have spoken with the patient and made them aware of the process.  Waitlist ID: [id number]  Fund name:  Prostate Cancer.   Lady Deutscher, CPhT-Adv Oncology Pharmacy Patient [place] Direct Number: [phone]  Fax: [phone]

## 2022-09-27 NOTE — Progress Notes (Signed)
Radiation Oncology         [phone] ________________________________  Outpatient Consultation  Name: Shane [name] MRN: [id number]  Date of Service: 09/28/2022 DOB: 06-17-1951  PP:IRJJOACZY, [name], MD  [name], MD   REFERRING PHYSICIAN: [name], MD  DIAGNOSIS: 71 year old male with advanced prostate cancer, T4N1Mb, Gleason 5+4 and PSA of 2.7 at diagnosis in 2020, with excellent treatment response and currently only evidence of disease activity remaining in the prostate.    ICD-10-CM   1. Malignant neoplasm of prostate ([place])  C61       HISTORY OF PRESENT ILLNESS: Shane [name] is a 71 y.o. male seen at the request of Dr. Alen Blew. We initially met the patient on 08/17/19 during our multidisciplinary prostate cancer clinic. In summary, he presented to Dr. [name] with hematuria and progressive LUTS. In-office cystoscopy showed two bladder tumors, with one appearing to originate from the prostate. CT A/P on 07/02/19 showed, in addition to the bladder mass, an enlarged right external iliac node, multiple pelvic soft tissue masses, lytic lesions to left inferior pubic ramus and posterior left ilium, and bilateral pulmonary nodules. He underwent TURBT/TURP procedure on 07/06/19, and final pathology revealed Gleason 5+4 prostate cancer. Staging CT C/A/P on 07/26/19 revealed right middle and upper lobe pulmonary nodules, metastatic adenopathy involving the presacral space and right pelvic sidewall, an irregular prostate gland with left bladder wall thickening and bone metastasis to the left iliac bone. Bone scan performed the same day confirmed osseous involvement, including bilateral pelvic lesions, the thoracic spine, and a left posterior rib. Per discussion/consensus from [facility], the recommendation was for therapy escalation, adding Zytiga to ADT, and pending response, consideration for local radiotherapy to the prostate in the future. He was started on ADT with Firmagon on 07/16/2019  and Zytiga/prednisone was added in 08/2019 per Dr. Alen Blew.  He has continued in close follow-up with Dr. Alen Blew since that time, on Eligard ADT every 4 months and daily Zytiga/prednisone, and his PSA has remained undetectable as of his most recent labs on 08/31/2022.  He had a restaging CT A/P in March 2021 that showed a positive treatment response with a reduction in the size of pelvic lymph nodes and number of bony lesions.   He recently underwent restaging PSMA PET scan on 09/10/22 showing fairly intense radiotracer activity within prostate gland but no evidence of metastatic adenopathy, visceral metastasis, or skeletal metastasis and a benign appearing right lobe pulmonary nodule.  He has been kindly referred back to [place] today for discussion of local treatment with prostate radiotherapy in the management of his disease.  PREVIOUS RADIATION THERAPY: No  PAST MEDICAL HISTORY:  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 ([place]) 06/28/2019   Cystoscopy [name]) - 2 bladder tumors, 1 on anterior prostate planned TURBT/TURP 06/2019 Biopsy - Gleason 5+4=9 prostate cancer Firmagon started 07/2019 planned metastatic survey   History of chicken pox    Lipoma of neck    Seasonal allergies    Syncopal episodes 2007   x 2 s/p unrevealing hospitalization without recurrence      PAST SURGICAL HISTORY: Past Surgical History:  Procedure Laterality Date   CYSTOSCOPY W/ RETROGRADES Bilateral 07/06/2019   Procedure: [place];  Surgeon: [name], MD;  Location: [facility];  Service: Urology;  Laterality: Bilateral;   EP IMPLANTABLE DEVICE N/A 02/25/2016   Procedure: Loop Recorder Removal;  Surgeon: Deboraha Sprang, MD;  Location:  [facility];  Service: Cardiovascular;  Laterality: N/A;   Internal Cardiac Monitor  2007   due to syncopal episodes - never contacted for f/u   IR NEPHROSTOMY  PLACEMENT LEFT  02/14/2020   IR NEPHROSTOMY PLACEMENT RIGHT  02/14/2020   LIPOMA EXCISION N/A 02/09/2016   Procedure: EXCISION NECK LIPOMA;  Surgeon: Autumn Messing III, MD;  Location: [facility];  Service: General;  Laterality: N/A;   TRANSURETHRAL RESECTION OF BLADDER TUMOR N/A 07/06/2019   Procedure: TRANSURETHRAL RESECTION OF BLADDER TUMOR (TURBT);  Surgeon: [name], MD;  Location: [facility];  Service: Urology;  Laterality: N/A;    FAMILY HISTORY:  Family History  Problem Relation Age of Onset   Hyperlipidemia Mother    Cancer Mother        peritoneal cancer   Hemochromatosis Father        s/p liver transplant - pt screened negative 07/2015   Hemochromatosis Paternal Grandmother    Hemochromatosis Paternal Uncle    Stroke Neg Hx    Diabetes Neg Hx    CAD Neg Hx     SOCIAL HISTORY:  Social History   Socioeconomic History   Marital status: Married    Spouse name: Not on file   Number of children: Not on file   Years of education: Not on file   Highest education level: Not on file  Occupational History   Not on file  Tobacco Use   Smoking status: Never   Smokeless tobacco: Never  Vaping Use   Vaping Use: Never used  Substance and Sexual Activity   Alcohol use: Yes    Alcohol/week: 5.0 standard drinks of alcohol    Types: 5 Cans of beer per week    Comment: 1 beer most evenings   Drug use: No   Sexual activity: Not on file  Other Topics Concern   Not on file  Social History Narrative   Lives with wife   Grown children   Occ: retired Engineer, structural, Musician   Edu: Bachelor of Art   Activity: stays active outdoors working   Diet: some water, fruits/vegetables daily   Social Determinants of Health   Financial Resource Strain: Low Risk  (05/07/2022)   Overall Financial Resource Strain (CARDIA)    Difficulty of Paying Living Expenses: Not hard at all  Food Insecurity: No Food Insecurity (05/07/2022)   Hunger Vital Sign    Worried  About Running Out of Food in the Last Year: Never true    Ran Out of Food in the Last Year: Never true  Transportation Needs: No Transportation Needs (05/07/2022)   PRAPARE - Hydrologist (Medical): No    Lack of Transportation (Non-Medical): No  Physical Activity: Sufficiently Active (05/07/2022)   Exercise Vital Sign    Days of Exercise per Week: 5 days    Minutes of Exercise per Session: 60 min  Stress: No Stress Concern Present (05/07/2022)   [place]    Feeling of Stress : Not at all  Social Connections: Moderately Integrated (05/07/2022)   Social Connection and Isolation Panel [NHANES]    Frequency of Communication with Friends and Family: Three times a week    Frequency of Social Gatherings with Friends and Family: Three times a week    Attends Religious Services: 1 to 4 times per year    Active Member of Clubs or Organizations: No    Attends  Club or Organization Meetings: Never    Marital Status: Married  Human resources officer Violence: Not At Risk (05/07/2022)   Humiliation, Afraid, Rape, and Kick questionnaire    Fear of Current or Ex-Partner: No    Emotionally Abused: No    Physically Abused: No    Sexually Abused: No    ALLERGIES: Effexor xr [venlafaxine hcl er], Bactrim [sulfamethoxazole-trimethoprim], Hydrocodone, Percocet [oxycodone-acetaminophen], Vancomycin, Penicillins, and Zithromax [azithromycin]  MEDICATIONS:  Current Outpatient Medications  Medication Sig Dispense Refill   abiraterone acetate (ZYTIGA) 250 MG tablet Take 4 tablets (1,000 mg total) by mouth daily. Take on an empty stomach 1 hour before or 2 hours after a meal 120 tablet 10   apixaban (ELIQUIS) 5 MG TABS tablet Take 1 tablet (5 mg total) by mouth 2 (two) times daily. 60 tablet 6   Calcium-Magnesium-Vitamin D (CITRACAL SLOW RELEASE PO) Take 1 tablet by mouth daily.     Leuprolide Acetate, 4 Month, (ELIGARD) 30 MG  injection Inject 30 mg into the skin every 4 (four) months.     predniSONE (DELTASONE) 5 MG tablet TAKE ONE TABLET BY MOUTH DAILY WITH BREAKFAST 90 tablet 0   QUEtiapine (SEROQUEL) 100 MG tablet Take 150 mg by mouth at bedtime.     No current facility-administered medications for this encounter.    REVIEW OF SYSTEMS:  On review of systems, the patient reports that he is doing well overall. He denies any chest pain, shortness of breath, cough, fevers, chills, night sweats, or unintended weight changes.  He continues with significant decreased stamina, energy and motivation, secondary to his hormone blockade which continues to be very bothersome to him as he is normally a very active, outgoing person.  He denies any bowel or bladder disturbances, and denies abdominal pain, nausea or vomiting.  His current IPSS score is 4, indicating mild urinary symptoms.  He denies any new musculoskeletal or joint aches or pains.  A complete review of systems is obtained and is otherwise negative.    PHYSICAL EXAM:  Wt Readings from Last 3 Encounters:  09/28/22 205 lb 12.8 oz (93.4 kg)  08/31/22 202 lb 3.2 oz (91.7 kg)  05/11/22 193 lb 2 oz (87.6 kg)   Temp Readings from Last 3 Encounters:  09/28/22 98.6 F (37 C)  08/31/22 97.8 F (36.6 C) (Temporal)  05/11/22 97.9 F (36.6 C) (Temporal)   BP Readings from Last 3 Encounters:  09/28/22 124/70  08/31/22 (!) 147/69  05/11/22 127/69   Pulse Readings from Last 3 Encounters:  09/28/22 73  08/31/22 70  05/11/22 65   Pain Assessment Pain Score: 0-No pain/10  In general this is a well appearing Caucasian male in no acute distress. He's alert and oriented x4 and appropriate throughout the examination. Cardiopulmonary assessment is negative for acute distress and he exhibits normal effort.     KPS = 100  100 - Normal; no complaints; no evidence of disease. 90   - Able to carry on normal activity; minor signs or symptoms of disease. 80   - Normal  activity with effort; some signs or symptoms of disease. 74   - Cares for self; unable to carry on normal activity or to do active work. 60   - Requires occasional assistance, but is able to care for most of his personal needs. 50   - Requires considerable assistance and frequent medical care. 42   - Disabled; requires special care and assistance. 30   - Severely disabled; hospital admission is indicated although  death not imminent. 35   - Very sick; hospital admission necessary; active supportive treatment necessary. 10   - Moribund; fatal processes progressing rapidly. 0     - Dead  Karnofsky DA, Abelmann Lawrence, Craver LS and Burchenal [facility] [phone]) The use of the nitrogen mustards in the palliative treatment of carcinoma: with particular reference to bronchogenic carcinoma Cancer 1 634-56  LABORATORY DATA:  Lab Results  Component Value Date   WBC 4.8 08/31/2022   HGB 12.8 (L) 08/31/2022   HCT 37.5 (L) 08/31/2022   MCV 97.2 08/31/2022   PLT 178 08/31/2022   Lab Results  Component Value Date   NA 141 08/31/2022   K 3.5 08/31/2022   CL 107 08/31/2022   CO2 28 08/31/2022   Lab Results  Component Value Date   ALT 30 08/31/2022   AST 30 08/31/2022   ALKPHOS 45 08/31/2022   BILITOT 0.8 08/31/2022     RADIOGRAPHY: NM PET (PSMA) SKULL TO MID THIGH  Result Date: 09/13/2022 CLINICAL DATA:  Prostate carcinoma with bone and lymph node metastasis. Diagnosis 2019. PSA less than 0.1. Androgen deprivation and chemotherapy ongoing. EXAM: NUCLEAR MEDICINE PET SKULL BASE TO THIGH TECHNIQUE: 8.76 mCi F18 Piflufolastat (Pylarify) was injected intravenously. Full-ring PET imaging was performed from the skull base to thigh after the radiotracer. CT data was obtained and used for attenuation correction and anatomic localization. COMPARISON:  None Available. FINDINGS: NECK No radiotracer activity in neck lymph nodes. Incidental CT finding: None. CHEST No radiotracer accumulation within mediastinal or hilar  lymph nodes. No suspicious pulmonary nodules on the CT scan. Incidental CT finding: 5 mm nodule in the RIGHT lower lobe (111/series 4) without radiotracer activity. Nodule stable over multiple comparison exams back to 02/13/2020. Findings consistent with benign nodule. No specific follow-up recommended. ABDOMEN/PELVIS Prostate: Intense radiotracer activity within the prostate gland with SUV max equal 11.3. The near entirety of the gland is radiotracer avid. Gland is relatively small measuring 3.1 by 2.6 by 2.8 cm. Lymph nodes: No abnormal radiotracer accumulation within pelvic or abdominal nodes. Liver: No evidence of liver metastasis. Incidental CT finding: Obstructing calculus in the LEFT kidney. Benign cyst of the RIGHT kidney. SKELETON No focal activity to suggest skeletal metastasis. No lytic or blastic lesion identified CT IMPRESSION: 1. Fairly intense radiotracer activity within the prostate gland. 2. No evidence metastatic adenopathy in the pelvis or periaortic retroperitoneum. 3. No evidence of visceral metastasis or skeletal metastasis. 4. Benign RIGHT lobe pulmonary nodule. Electronically Signed   By: Suzy Bouchard M.D.   On: 09/13/2022 09:47      IMPRESSION/PLAN: 1. 71 y.o. male with advanced prostate cancer, T4N1Mb, Gleason 5+4 and PSA of 2.7 at diagnosis in 2020, with excellent treatment response and currently only evidence of disease activity remaining in the prostate.  Today, we talked to the patient and his wife, about the findings and workup thus far. We discussed the natural history of prostate cancer and general treatment, highlighting the role of radiotherapy in the management. We discussed the available radiation techniques, and focused on the details and logistics of delivery.  The recommendation is for a 4-week course of daily radiotherapy to the prostate, in line with the STAMPEDE trial.  We reviewed the anticipated acute and late sequelae associated with radiation in this setting.  The patient was encouraged to ask questions that were answered to his stated satisfaction.  At the end of our conversation, the patient is interested in proceeding with the recommended 4-week course of external  beam therapy concurrent with his current ADT and Zytiga. We will share our discussion with Dr. Alen Blew and Dr. Alinda Money and make arrangements for fiducial marker placement, first available.  Given his high risk disease, we do not recommend SpaceOAR gel placement.  The patient appears to have a good understanding of his disease and our treatment recommendations which are of curative intent and is in agreement with the stated plan.  He has freely signed written consent to proceed today in the office and a copy of this document will be placed in his medical record.  Therefore, we will move forward with treatment planning accordingly, in anticipation of beginning IMRT in the near future.  We personally spent 70 minutes in this encounter including chart review, reviewing radiological studies, meeting face-to-face with the patient, entering orders and completing documentation.    Nicholos Johns, PA-C    Tyler Pita, MD  [facility] Direct Dial: [phone]  Fax: [phone] [email or web address]  Skype  [email or web address]   This document serves as a record of services personally performed by Tyler Pita, MD and Freeman Caldron, PA-C. It was created on their behalf by Wilburn Mylar, a trained medical scribe. The creation of this record is based on the scribe's personal observations and the provider's statements to them. This document has been checked and approved by the attending provider.

## 2022-09-28 ENCOUNTER — Other Ambulatory Visit: Payer: Self-pay

## 2022-09-28 ENCOUNTER — Encounter: Payer: Self-pay | Admitting: Oncology

## 2022-09-28 ENCOUNTER — Ambulatory Visit
Admission: RE | Admit: 2022-09-28 | Discharge: 2022-09-28 | Disposition: A | Discharge status: Home / Self Care | Payer: PPO | Source: Ambulatory Visit | Attending: Radiation Oncology | Admitting: Radiation Oncology

## 2022-09-28 ENCOUNTER — Other Ambulatory Visit (HOSPITAL_COMMUNITY): Payer: Self-pay

## 2022-09-28 VITALS — BP 124/70 | HR 73 | Temp 98.6°F | Resp 18 | Ht 70.25 in | Wt 205.8 lb

## 2022-09-28 DIAGNOSIS — Z800 Family history of malignant neoplasm of digestive organs: Secondary | ICD-10-CM | POA: Insufficient documentation

## 2022-09-28 DIAGNOSIS — C61 Malignant neoplasm of prostate: Secondary | ICD-10-CM

## 2022-09-28 DIAGNOSIS — N281 Cyst of kidney, acquired: Secondary | ICD-10-CM | POA: Insufficient documentation

## 2022-09-28 DIAGNOSIS — N200 Calculus of kidney: Secondary | ICD-10-CM | POA: Insufficient documentation

## 2022-09-28 DIAGNOSIS — Z191 Hormone sensitive malignancy status: Secondary | ICD-10-CM | POA: Diagnosis not present

## 2022-09-28 DIAGNOSIS — Z7952 Long term (current) use of systemic steroids: Secondary | ICD-10-CM | POA: Diagnosis not present

## 2022-09-28 DIAGNOSIS — R918 Other nonspecific abnormal finding of lung field: Secondary | ICD-10-CM | POA: Diagnosis not present

## 2022-09-28 DIAGNOSIS — C7951 Secondary malignant neoplasm of bone: Secondary | ICD-10-CM | POA: Diagnosis not present

## 2022-09-28 DIAGNOSIS — Z7901 Long term (current) use of anticoagulants: Secondary | ICD-10-CM | POA: Diagnosis not present

## 2022-10-04 ENCOUNTER — Telehealth: Payer: Self-pay | Admitting: *Deleted

## 2022-10-04 NOTE — Telephone Encounter (Signed)
RETURNED PATIENT'S PHONE CALL, SPOKE WITH PATIENT'S WIFE

## 2022-10-07 ENCOUNTER — Telehealth: Payer: Self-pay | Admitting: Pharmacy Technician

## 2022-10-07 ENCOUNTER — Telehealth: Payer: Self-pay | Admitting: *Deleted

## 2022-10-07 ENCOUNTER — Encounter: Payer: Self-pay | Admitting: Oncology

## 2022-10-07 ENCOUNTER — Other Ambulatory Visit: Payer: Self-pay | Admitting: Urology

## 2022-10-07 ENCOUNTER — Other Ambulatory Visit (HOSPITAL_COMMUNITY): Payer: Self-pay

## 2022-10-07 NOTE — Telephone Encounter (Signed)
CALLED PATIENT TO INFORM OF FID. MARKER APPT. FOR 11-16-22 AND HIS SIM ON 11-19-22- ARRIVAL TIME- 2:45 PM @ [facility], INFORMED PATIENT TO ARRIVE WITH A FULL BLADDER, SPOKE WITH PATIENT AND HE IS AWARE OF THESE APPTS. AND THE INSTRUCTIONS

## 2022-10-07 NOTE — Telephone Encounter (Signed)
Oral Oncology Patient Advocate Encounter  Was successful in securing patient a $8,000 grant from [facility] to provide copayment coverage for Abiraterone.  This will keep the out of pocket expense at $0.     [employer] ID: [id number]  I have spoken with the patient.   The billing information is as follows and has been shared with [facility].    RxBin: [id number] PCN: [id number] Member ID: [id number] Group ID: [id number] Dates of Eligibility: 09/07/22 through 09/07/23  Fund:  [place], CPhT-Adv Oncology Pharmacy Patient [place] Direct Number: [phone]  Fax: [phone]

## 2022-10-08 ENCOUNTER — Other Ambulatory Visit: Payer: Self-pay | Admitting: Urology

## 2022-10-18 ENCOUNTER — Other Ambulatory Visit (HOSPITAL_COMMUNITY): Payer: Self-pay

## 2022-10-25 ENCOUNTER — Other Ambulatory Visit (HOSPITAL_COMMUNITY): Payer: Self-pay

## 2022-10-29 ENCOUNTER — Other Ambulatory Visit (HOSPITAL_COMMUNITY): Payer: Self-pay

## 2022-11-02 DIAGNOSIS — C778 Secondary and unspecified malignant neoplasm of lymph nodes of multiple regions: Secondary | ICD-10-CM | POA: Diagnosis not present

## 2022-11-02 DIAGNOSIS — R350 Frequency of micturition: Secondary | ICD-10-CM | POA: Diagnosis not present

## 2022-11-02 DIAGNOSIS — C61 Malignant neoplasm of prostate: Secondary | ICD-10-CM | POA: Diagnosis not present

## 2022-11-03 ENCOUNTER — Telehealth: Payer: Self-pay | Admitting: Family Medicine

## 2022-11-04 NOTE — Telephone Encounter (Signed)
Error

## 2022-11-06 ENCOUNTER — Other Ambulatory Visit: Payer: Self-pay | Admitting: Oncology

## 2022-11-10 ENCOUNTER — Telehealth: Payer: Self-pay | Admitting: Oncology

## 2022-11-10 ENCOUNTER — Encounter: Payer: Self-pay | Admitting: Family Medicine

## 2022-11-10 ENCOUNTER — Ambulatory Visit (INDEPENDENT_AMBULATORY_CARE_PROVIDER_SITE_OTHER): Payer: PPO | Admitting: Family Medicine

## 2022-11-10 VITALS — BP 126/66 | HR 77 | Temp 97.2°F | Ht 70.25 in | Wt 205.0 lb

## 2022-11-10 DIAGNOSIS — F1994 Other psychoactive substance use, unspecified with psychoactive substance-induced mood disorder: Secondary | ICD-10-CM

## 2022-11-10 DIAGNOSIS — I10 Essential (primary) hypertension: Secondary | ICD-10-CM

## 2022-11-10 DIAGNOSIS — Z8349 Family history of other endocrine, nutritional and metabolic diseases: Secondary | ICD-10-CM | POA: Diagnosis not present

## 2022-11-10 DIAGNOSIS — C61 Malignant neoplasm of prostate: Secondary | ICD-10-CM

## 2022-11-10 LAB — IBC PANEL
Iron: 130 ug/dL (ref 42–165)
Saturation Ratios: 41.5 % (ref 20.0–50.0)
TIBC: 313.6 ug/dL (ref 250.0–450.0)
Transferrin: 224 mg/dL (ref 212.0–360.0)

## 2022-11-10 LAB — FERRITIN: Ferritin: 88.3 ng/mL (ref 22.0–322.0)

## 2022-11-10 NOTE — Patient Instructions (Addendum)
You are doing well today Will update iron levels today.  Return as needed or in 6 months for physical/wellness visit

## 2022-11-10 NOTE — Telephone Encounter (Signed)
Called patient regarding January appointment, patient is notified. 

## 2022-11-10 NOTE — Progress Notes (Unsigned)
Patient ID: Shane [name], male    DOB: May 21, 1951, 70 y.o.   MRN: [id number]  This visit was conducted in person.  BP 126/66   Pulse 77   Temp (!) 97.2 F (36.2 C) (Temporal)   Ht 5' 10.25" (1.784 m)   Wt 205 lb (93 kg)   SpO2 100%   BMI 29.21 kg/m    CC: 6 mo f/u visit  Subjective:   HPI: Shane [name] is a 71 y.o. male presenting on 11/10/2022 for Follow-up (Here for 6 mo f/u.)   Psychiatric decompensation after hospitalization 02/2022 while on Effexor '150mg'$  treatment.  Saw [facility] psychiatrist Dr Wynetta Emery x2 - now only on seroquel '50mg'$  nightly. Higher dose was too sedating. Has decided not to return to psychiatry. Continues thinking about remote ex of wife, aware these are not reasonable thoughts however he is unable to stop thinking about it - he's handling things on his own.  Continued irritability stemming from this, overall stable period.  Not interested in counseling at this time.   No tremor, stiffness, paresthesias, memory difficulty.   Saw Dr Tammi Klippel 09/2022 rad onc for known advanced prostate cancer, note reviewed. Planning 4 wk external bean radiation treatment along with ADT and Zytiga. Also followed by Dr Alen Blew and Dr Michaelene Song. Rec continue calcium and vitamin D supplements. Also continues eliquis indefinitely given recurrent PEs. Overall very stable period regarding prostate cancer.   Currently undergoing treatment for UTI with doxycycline '100mg'$  bid.   He has strong family history of hereditary hemochromatosis. His iron levels have always been normal. He has not undergone genetic testing. Will repeat iron levels.   Continues going to gym 3x/wk.      Relevant past medical, surgical, family and social history reviewed and updated as indicated. Interim medical history since our last visit reviewed. Allergies and medications reviewed and updated. Outpatient Medications Prior to Visit  Medication Sig Dispense Refill   abiraterone acetate (ZYTIGA) 250 MG  tablet Take 4 tablets (1,000 mg total) by mouth daily. Take on an empty stomach 1 hour before or 2 hours after a meal 120 tablet 10   apixaban (ELIQUIS) 5 MG TABS tablet Take 1 tablet (5 mg total) by mouth 2 (two) times daily. 60 tablet 6   Calcium-Magnesium-Vitamin D (CITRACAL SLOW RELEASE PO) Take 1 tablet by mouth daily.     Leuprolide Acetate, 4 Month, (ELIGARD) 30 MG injection Inject 30 mg into the skin every 4 (four) months.     predniSONE (DELTASONE) 5 MG tablet TAKE 1 TABLET BY MOUTH DAILY WITH BREAKFAST 90 tablet 0   QUEtiapine (SEROQUEL) 100 MG tablet Take 150 mg by mouth at bedtime. Takes 50 mg tablet     No facility-administered medications prior to visit.     Per HPI unless specifically indicated in ROS section below Review of Systems  Objective:  BP 126/66   Pulse 77   Temp (!) 97.2 F (36.2 C) (Temporal)   Ht 5' 10.25" (1.784 m)   Wt 205 lb (93 kg)   SpO2 100%   BMI 29.21 kg/m   Wt Readings from Last 3 Encounters:  11/10/22 205 lb (93 kg)  09/28/22 205 lb 12.8 oz (93.4 kg)  08/31/22 202 lb 3.2 oz (91.7 kg)      Physical Exam Vitals and nursing note reviewed.  Constitutional:      Appearance: Normal appearance. He is not ill-appearing.  HENT:     Mouth/Throat:     Mouth: Mucous membranes are moist.  Pharynx: Oropharynx is clear. No oropharyngeal exudate or posterior oropharyngeal erythema.  Eyes:     Extraocular Movements: Extraocular movements intact.     Conjunctiva/sclera: Conjunctivae normal.     Pupils: Pupils are equal, round, and reactive to light.  Neck:     Thyroid: No thyroid mass or thyromegaly.  Cardiovascular:     Rate and Rhythm: Normal rate and regular rhythm.     Pulses: Normal pulses.     Heart sounds: Normal heart sounds. No murmur heard. Pulmonary:     Effort: Pulmonary effort is normal. No respiratory distress.     Breath sounds: Normal breath sounds. No wheezing, rhonchi or rales.  Musculoskeletal:     Right lower leg: No edema.      Left lower leg: No edema.  Skin:    General: Skin is warm and dry.     Findings: No rash.  Neurological:     Mental Status: He is alert.  Psychiatric:        Mood and Affect: Mood normal.        Behavior: Behavior normal.       Results for orders placed or performed in visit on 11/10/22  Ferritin  Result Value Ref Range   Ferritin 88.3 22.0 - 322.0 ng/mL  IBC panel  Result Value Ref Range   Iron 130 42 - 165 ug/dL   Transferrin 224.0 212.0 - 360.0 mg/dL   Saturation Ratios 41.5 20.0 - 50.0 %   TIBC 313.6 250.0 - 450.0 mcg/dL   Lab Results  Component Value Date   CHOL 206 (H) 05/04/2022   HDL 53.40 05/04/2022   LDLCALC 131 (H) 05/04/2022   TRIG 108.0 05/04/2022   CHOLHDL 4 05/04/2022    Assessment & Plan:   Problem List Items Addressed This Visit       Unprioritized   Family history of hemochromatosis    Update iron panel. Previous iron panel and LFTs have been normal.       Relevant Orders   Ferritin (Completed)   IBC panel (Completed)   Malignant neoplasm of prostate ([facility])    Appreciate uro, onc, rad onc care.  Currently continues zytiga, leuprolide, prednisone '5mg'$  daily.  Planning external beam radiation therapy.       Hypertension    BP better controlled with health diet changes, without needing antihypertensive.       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 Currently on '50mg'$  nightly, desires to fully wean off. Discussed taper, recommended slowly, he will drop dose to '25mg'$  nightly, discussed option of '25mg'$  tablets on next refill if desired. He is aware to monitor for worsening symptoms as he tapers of seroquel.  At this time plans to continue leuprolide for advanced prostate cancer treatment.         No orders of the defined types were placed in this encounter.  Orders Placed This Encounter  Procedures   Ferritin    IBC panel     Patient Instructions  You are doing well today Will update iron levels today.  Return as needed or in 6 months for physical/wellness visit  Follow up plan: Return in about 6 months (around 05/12/2023), or if symptoms worsen or fail to improve, for annual exam, prior fasting for blood work, medicare wellness visit.  [name], MD

## 2022-11-11 ENCOUNTER — Encounter (HOSPITAL_BASED_OUTPATIENT_CLINIC_OR_DEPARTMENT_OTHER): Payer: Self-pay | Admitting: Urology

## 2022-11-11 NOTE — Assessment & Plan Note (Addendum)
Appreciate uro, onc, rad onc care.  Currently continues zytiga, leuprolide, prednisone '5mg'$  daily.  Planning external beam radiation therapy.

## 2022-11-11 NOTE — Progress Notes (Signed)
Spoke w/ via phone for pre-op interview---  pt  and pt's wife Lab needs dos---- [employer] results------ current EKG in epic/ chart COVID test -----patient states asymptomatic no test needed Arrive at -------  0730 on 11-16-2022 NPO after MN NO Solid Food.  Clear liquids from MN until--- 0630 Med rec completed Medications to take morning of surgery ----- prednisone, zytiga Diabetic medication ----- n/a Patient instructed no nail polish to be worn day of surgery Patient instructed to bring photo id and insurance card day of surgery Patient aware to have Driver (ride ) / caregiver for 24 hours after surgery --- wife , frances Patient Special Instructions ----- per pt instructions given by office to do one fleet enema morning of surgery also to continue eliquis do not stop prior to surgery Pre-Op special Istructions ----- n/a Patient verbalized understanding of instructions that were given at this phone interview. Patient denies shortness of breath, chest pain, fever, cough at this phone interview.

## 2022-11-11 NOTE — Assessment & Plan Note (Signed)
BP better controlled with health diet changes, without needing antihypertensive.

## 2022-11-11 NOTE — Assessment & Plan Note (Signed)
Update iron panel. Previous iron panel and LFTs have been normal.

## 2022-11-11 NOTE — Assessment & Plan Note (Addendum)
Saw psychiatrist twice - possible medication induced hypomania (Effexor vs leuprolide). Currently on '50mg'$  nightly, desires to fully wean off. Discussed taper, recommended slowly, he will drop dose to '25mg'$  nightly, discussed option of '25mg'$  tablets on next refill if desired. He is aware to monitor for worsening symptoms as he tapers of seroquel.  At this time plans to continue leuprolide for advanced prostate cancer treatment.

## 2022-11-15 NOTE — H&P (Signed)
11/02/2022: In brief, pt with past hx of metastatic PCa originally followed by Dr. [name] but then referred to Dr. Alen Blew for continued management. Currently on Zytiga/prednisone daily as well as Eligard ADT every 4 months. Last PSMA PET scan in early October showed fairly intense radiotracer activity within the prostate gland but no continued evidence of metastatic adenopathy, visceral metastasis or skeletal metastasis. He has been since referred to radiation oncology to undergo 4 weeks of XRT for additional treatment. He is scheduled for fiducial marker placement on 12/12.   Overall patient doing well. Vasomotor symptoms controlled with Effexor. He remains quite active with hunting, weightlifting 3 times per week. Voiding symptoms are grossly stable. He is not having any bothersome daytime symptomology, nocturia stable 1-2 times nightly. No recent dysuria, gross hematuria or treatment for UTI. He is on Eliquis for prior history of PE. No recent chest pain, shortness of breath, lightheadedness or dizziness, fever/chills or nausea/vomiting.     ALLERGIES: azithromycin - Skin Rash Oxycodone-Acetaminophen penicillin - Swelling, rash Sulfamethoxazole-Trimethoprim - Hives, rash    MEDICATIONS: Sildenafil Citrate 100 mg tablet 1/2 tablet PO Daily PRN  Citracal  Eliquis 5 mg tablet 1 tablet PO BID  Prednisone 5 mg tablet 1 tablet PO Daily  Quetiapine Fumarate 50 mg tablet  Zytiga 250 mg tablet 1 tablet PO QID     GU PSH: Cysto Remove Stent FB Sim - 2021 Cystoscopy - 2020 Cystoscopy Insert Stent, Bilateral - 2020 Cystoscopy TURBT >5 cm - 2020 Locm 300-'399Mg'$ /Ml Iodine,1Ml - 2020 Nephrostomy Tube Change - 2021     NON-GU PSH: No Non-GU PSH    GU PMH: Gross hematuria - 08/11/2021, - 2020 Prostate Cancer - 08/11/2021, - 2021, - 2020 Secondary bone metastases - 2021, - 2021 Hydronephrosis (Stable) - 2021, - 2020 Pelvic/perineal pain - 2020 Chronic prostatitis - 2020    NON-GU PMH: Metastatic  lymphadenopathy of multiple regions - 08/11/2021, - 2021, - 2021 Other specified disorders of muscle - 2020 Muscle weakness (generalized) - 2020 Other muscle spasm - 2020    FAMILY HISTORY: 2 sons - Son hemochromatosis - Son peritoneal cancer - Mother    Notes: antitrypsin disease- father   SOCIAL HISTORY: Marital Status: Married Preferred Language: English; Ethnicity: Not Hispanic Or Latino; Race: White Current Smoking Status: Patient has never smoked.   Tobacco Use Assessment Completed: Used Tobacco in last 30 days? Does drink.  Drinks 1 caffeinated drink per day.    REVIEW OF SYSTEMS:    GU Review Male:   Patient reports get up at night to urinate. Patient denies frequent urination, hard to postpone urination, burning/ pain with urination, leakage of urine, stream starts and stops, trouble starting your stream, have to strain to urinate , erection problems, and penile pain.  Gastrointestinal (Upper):   Patient denies nausea, vomiting, and indigestion/ heartburn.  Gastrointestinal (Lower):   Patient denies diarrhea and constipation.  Constitutional:   Patient denies fever, night sweats, weight loss, and fatigue.  Skin:   Patient denies skin rash/ lesion and itching.  Eyes:   Patient denies blurred vision and double vision.  Ears/ Nose/ Throat:   Patient denies sore throat and sinus problems.  Hematologic/Lymphatic:   Patient denies swollen glands and easy bruising.  Cardiovascular:   Patient denies chest pains and leg swelling.  Respiratory:   Patient denies cough and shortness of breath.  Endocrine:   Patient denies excessive thirst.  Musculoskeletal:   Patient denies back pain and joint pain.  Neurological:   Patient  denies headaches and dizziness.  Psychologic:   Patient denies depression and anxiety.   VITAL SIGNS:      11/02/2022 02:36 PM  BP 172/82 mmHg  Pulse 66 /min  Temperature 97.5 F / 36.3 C   MULTI-SYSTEM PHYSICAL EXAMINATION:    Constitutional: Well-nourished.  No physical deformities. Normally developed. Good grooming.  Neck: Neck symmetrical, not swollen. Normal tracheal position.  Respiratory: No labored breathing, no use of accessory muscles.   Cardiovascular: Normal temperature, normal extremity pulses, no swelling, no varicosities.  Skin: No paleness, no jaundice, no cyanosis. No lesion, no ulcer, no rash.  Neurologic / Psychiatric: Oriented to time, oriented to place, oriented to person. No depression, no anxiety, no agitation.  Gastrointestinal: No mass, no tenderness, no rigidity, non obese abdomen.  Musculoskeletal: Normal gait and station of head and neck.     Complexity of Data:  Source Of History:  Patient, Medical Record Summary  Lab Test Review:   PSA, Total Testosterone  Records Review:   Pathology Reports, Previous Doctor Records, Previous Hospital Records, Previous Patient Records  Urine Test Review:   Urinalysis  X-Ray Review: PET- PSMA Scan: Reviewed Report.     08/16/19  PSA  Total PSA 1.28 ng/mL    11/02/22  Urinalysis  Urine Appearance Clear   Urine Color Yellow   Urine Glucose Neg mg/dL  Urine Bilirubin Neg mg/dL  Urine Ketones Neg mg/dL  Urine Specific Gravity 1.015   Urine Blood Neg ery/uL  Urine pH 7.0   Urine Protein Neg mg/dL  Urine Urobilinogen 0.2 mg/dL  Urine Nitrites Neg   Urine Leukocyte Esterase Neg leu/uL  Notes:                     CLINICAL DATA: Prostate carcinoma with bone and lymph node  metastasis. Diagnosis 2019. PSA less than 0.1. Androgen deprivation  and chemotherapy ongoing.   EXAM:  NUCLEAR MEDICINE PET SKULL BASE TO THIGH   TECHNIQUE:  8.76 mCi F18 Piflufolastat (Pylarify) was injected intravenously.  Full-ring PET imaging was performed from the skull base to thigh  after the radiotracer. CT data was obtained and used for attenuation  correction and anatomic localization.   COMPARISON: None Available.   FINDINGS:  NECK   No radiotracer activity in neck lymph nodes.    Incidental CT finding: None.   CHEST   No radiotracer accumulation within mediastinal or hilar lymph nodes.  No suspicious pulmonary nodules on the CT scan.   Incidental CT finding: 5 mm nodule in the RIGHT lower lobe  (111/series 4) without radiotracer activity. Nodule stable over  multiple comparison exams back to 02/13/2020. Findings consistent  with benign nodule. No specific follow-up recommended.   ABDOMEN/PELVIS   Prostate: Intense radiotracer activity within the prostate gland  with SUV max equal 11.3. The near entirety of the gland is  radiotracer avid. Gland is relatively small measuring 3.1 by 2.6 by  2.8 cm.   Lymph nodes: No abnormal radiotracer accumulation within pelvic or  abdominal nodes.   Liver: No evidence of liver metastasis.   Incidental CT finding: Obstructing calculus in the LEFT kidney.  Benign cyst of the RIGHT kidney.   SKELETON   No focal activity to suggest skeletal metastasis. No lytic or  blastic lesion identified CT   IMPRESSION:  1. Fairly intense radiotracer activity within the prostate gland.  2. No evidence metastatic adenopathy in the pelvis or periaortic  retroperitoneum.  3. No evidence of visceral metastasis or skeletal  metastasis.  4. Benign RIGHT lobe pulmonary nodule.    Electronically Signed  By: Suzy Bouchard M.D.  On: 09/13/2022 09:47     PROCEDURES:          Urinalysis Dipstick Dipstick Cont'd  Color: Yellow Bilirubin: Neg mg/dL  Appearance: Clear Ketones: Neg mg/dL  Specific Gravity: 1.015 Blood: Neg ery/uL  pH: 7.0 Protein: Neg mg/dL  Glucose: Neg mg/dL Urobilinogen: 0.2 mg/dL    Nitrites: Neg    Leukocyte Esterase: Neg leu/uL    ASSESSMENT:      ICD-10 Details  1 GU:   Prostate Cancer - C61 Chronic, Threat to Bodily Function  2 NON-GU:   Metastatic lymphadenopathy of multiple regions - C77.8 Chronic, Threat to Bodily Function  3   Encounter for other preprocedural examination - Z01.818 Undiagnosed  New Problem   PLAN:            Medications Stop Meds: Atorvastatin Calcium 20 mg tablet  Discontinue: 11/02/2022  - Reason: The medication cycle was completed.  Tamsulosin Hcl 0.4 mg capsule TAKE ONE CAPSULE BY MOUTH DAILY  Start: 03/07/2020  Discontinue: 11/02/2022  - Reason: The medication cycle was completed.            Orders Labs Urine Culture          Schedule Return Visit/Planned Activity: 4 Months - Follow up MD  Return Visit/Planned Activity: Keep Scheduled Appointment - Follow up MD, Schedule Surgery          Document Letter(s):  Created for Patient: Clinical Summary         Notes:   All questions answered to the best of my ability regarding the upcoming procedure and expected postoperative course with understanding expressed by the patient and his wife. Urine culture sent today to serve as a baseline. I will check on the need for possibly discontinuing Eliquis prior to the procedure and help coordinating getting clearance if indicated as well as letting patient/family know if they need to stop it as well. Moving forward he will proceed with fiducial marker placement on 12/12.        Next Appointment:      Next Appointment: 11/16/2022 11:30 AM    Appointment Type: Surgery     Location: [facility] [phone]    Provider: Irine Seal, M.D.    Reason for Visit: NE/OP FIDUCIAL MARKERS ONLY AND TRANSRECTAL [place]

## 2022-11-16 ENCOUNTER — Other Ambulatory Visit: Payer: Self-pay

## 2022-11-16 ENCOUNTER — Ambulatory Visit (HOSPITAL_BASED_OUTPATIENT_CLINIC_OR_DEPARTMENT_OTHER): Payer: PPO | Admitting: Anesthesiology

## 2022-11-16 ENCOUNTER — Encounter (HOSPITAL_BASED_OUTPATIENT_CLINIC_OR_DEPARTMENT_OTHER): Payer: Self-pay | Admitting: Urology

## 2022-11-16 ENCOUNTER — Ambulatory Visit (HOSPITAL_BASED_OUTPATIENT_CLINIC_OR_DEPARTMENT_OTHER)
Admission: RE | Admit: 2022-11-16 | Discharge: 2022-11-16 | Disposition: A | Discharge status: Home / Self Care | Payer: PPO | Attending: Urology | Admitting: Urology

## 2022-11-16 ENCOUNTER — Encounter (HOSPITAL_BASED_OUTPATIENT_CLINIC_OR_DEPARTMENT_OTHER)
Admission: RE | Disposition: A | Discharge status: Home / Self Care | Payer: Self-pay | Source: Home / Self Care | Attending: Urology

## 2022-11-16 DIAGNOSIS — C61 Malignant neoplasm of prostate: Secondary | ICD-10-CM | POA: Diagnosis not present

## 2022-11-16 DIAGNOSIS — I2510 Atherosclerotic heart disease of native coronary artery without angina pectoris: Secondary | ICD-10-CM

## 2022-11-16 DIAGNOSIS — Z01818 Encounter for other preprocedural examination: Secondary | ICD-10-CM

## 2022-11-16 DIAGNOSIS — I10 Essential (primary) hypertension: Secondary | ICD-10-CM | POA: Diagnosis not present

## 2022-11-16 DIAGNOSIS — Z79899 Other long term (current) drug therapy: Secondary | ICD-10-CM | POA: Insufficient documentation

## 2022-11-16 DIAGNOSIS — I2699 Other pulmonary embolism without acute cor pulmonale: Secondary | ICD-10-CM | POA: Diagnosis not present

## 2022-11-16 DIAGNOSIS — Z8589 Personal history of malignant neoplasm of other organs and systems: Secondary | ICD-10-CM | POA: Diagnosis not present

## 2022-11-16 HISTORY — DX: Presence of spectacles and contact lenses: Z97.3

## 2022-11-16 HISTORY — DX: Family history of other endocrine, nutritional and metabolic diseases: Z83.49

## 2022-11-16 HISTORY — DX: Essential (primary) hypertension: I10

## 2022-11-16 HISTORY — DX: Long term (current) use of anticoagulants: Z79.01

## 2022-11-16 HISTORY — PX: GOLD SEED IMPLANT: SHX6343

## 2022-11-16 HISTORY — PX: TRANSRECTAL ULTRASOUND: SHX5146

## 2022-11-16 HISTORY — DX: Nocturia: R35.1

## 2022-11-16 LAB — POCT I-STAT, CHEM 8
BUN: 17 mg/dL (ref 8–23)
Calcium, Ion: 1.31 mmol/L (ref 1.15–1.40)
Chloride: 104 mmol/L (ref 98–111)
Creatinine, Ser: 1.1 mg/dL (ref 0.61–1.24)
Glucose, Bld: 102 mg/dL — ABNORMAL HIGH (ref 70–99)
HCT: 38 % — ABNORMAL LOW (ref 39.0–52.0)
Hemoglobin: 12.9 g/dL — ABNORMAL LOW (ref 13.0–17.0)
Potassium: 3.9 mmol/L (ref 3.5–5.1)
Sodium: 141 mmol/L (ref 135–145)
TCO2: 26 mmol/L (ref 22–32)

## 2022-11-16 SURGERY — INSERTION, GOLD SEEDS
Anesthesia: Monitor Anesthesia Care | Site: Rectum

## 2022-11-16 MED ORDER — LIDOCAINE HCL (CARDIAC) PF 100 MG/5ML IV SOSY
PREFILLED_SYRINGE | INTRAVENOUS | Status: DC | PRN
  Administered 2022-11-16: 60 mg via INTRAVENOUS

## 2022-11-16 MED ORDER — MIDAZOLAM HCL 2 MG/2ML IJ SOLN
INTRAMUSCULAR | Status: AC
  Filled 2022-11-16: qty 2

## 2022-11-16 MED ORDER — CIPROFLOXACIN IN D5W 400 MG/200ML IV SOLN
INTRAVENOUS | Status: AC
  Filled 2022-11-16: qty 200

## 2022-11-16 MED ORDER — LIDOCAINE HCL URETHRAL/MUCOSAL 2 % EX GEL
CUTANEOUS | Status: DC | PRN
  Administered 2022-11-16: 1

## 2022-11-16 MED ORDER — FENTANYL CITRATE (PF) 100 MCG/2ML IJ SOLN
INTRAMUSCULAR | Status: DC | PRN
  Administered 2022-11-16 (×2): 25 ug via INTRAVENOUS

## 2022-11-16 MED ORDER — MIDAZOLAM HCL 2 MG/2ML IJ SOLN
INTRAMUSCULAR | Status: DC | PRN
  Administered 2022-11-16: 2 mg via INTRAVENOUS

## 2022-11-16 MED ORDER — LIDOCAINE HCL 2 % IJ SOLN
INTRAMUSCULAR | Status: DC | PRN
  Administered 2022-11-16: 10 mL

## 2022-11-16 MED ORDER — AMISULPRIDE (ANTIEMETIC) 5 MG/2ML IV SOLN: 10.0000 mg | Freq: Once | INTRAVENOUS | Status: DC | PRN

## 2022-11-16 MED ORDER — ACETAMINOPHEN 160 MG/5ML PO SOLN: 325.0000 mg | ORAL | Status: DC | PRN

## 2022-11-16 MED ORDER — PROPOFOL 10 MG/ML IV BOLUS: INTRAVENOUS | Status: DC | PRN

## 2022-11-16 MED ORDER — CIPROFLOXACIN IN D5W 400 MG/200ML IV SOLN
400.0000 mg | Freq: Two times a day (BID) | INTRAVENOUS | Status: DC
  Administered 2022-11-16: 400 mg via INTRAVENOUS

## 2022-11-16 MED ORDER — FENTANYL CITRATE (PF) 100 MCG/2ML IJ SOLN: 25.0000 ug | INTRAMUSCULAR | Status: DC | PRN

## 2022-11-16 MED ORDER — ACETAMINOPHEN 500 MG PO TABS
ORAL_TABLET | ORAL | Status: AC
  Filled 2022-11-16: qty 2

## 2022-11-16 MED ORDER — ACETAMINOPHEN 160 MG/5ML PO SOLN: 1000.0000 mg | Freq: Once | ORAL | Status: DC

## 2022-11-16 MED ORDER — SODIUM CHLORIDE 0.9 % IV SOLN: 250.0000 mL | INTRAVENOUS | Status: DC | PRN

## 2022-11-16 MED ORDER — ACETAMINOPHEN 325 MG PO TABS: 650.0000 mg | ORAL_TABLET | ORAL | Status: DC | PRN

## 2022-11-16 MED ORDER — TRAMADOL HCL 50 MG PO TABS: 50.0000 mg | ORAL_TABLET | Freq: Four times a day (QID) | ORAL | Status: DC | PRN

## 2022-11-16 MED ORDER — FENTANYL CITRATE (PF) 100 MCG/2ML IJ SOLN
INTRAMUSCULAR | Status: AC
  Filled 2022-11-16: qty 2

## 2022-11-16 MED ORDER — ACETAMINOPHEN 10 MG/ML IV SOLN: 1000.0000 mg | Freq: Once | INTRAVENOUS | Status: DC | PRN

## 2022-11-16 MED ORDER — LACTATED RINGERS IV SOLN: INTRAVENOUS | Status: DC

## 2022-11-16 MED ORDER — ACETAMINOPHEN 500 MG PO TABS
1000.0000 mg | ORAL_TABLET | Freq: Once | ORAL | Status: AC
  Administered 2022-11-16: 1000 mg via ORAL

## 2022-11-16 MED ORDER — SODIUM CHLORIDE 0.9% FLUSH: 3.0000 mL | INTRAVENOUS | Status: DC | PRN

## 2022-11-16 MED ORDER — SODIUM CHLORIDE 0.9% FLUSH: 3.0000 mL | Freq: Two times a day (BID) | INTRAVENOUS | Status: DC

## 2022-11-16 MED ORDER — ACETAMINOPHEN 325 MG RE SUPP: 650.0000 mg | RECTAL | Status: DC | PRN

## 2022-11-16 MED ORDER — ONDANSETRON HCL 4 MG/2ML IJ SOLN
INTRAMUSCULAR | Status: DC | PRN
  Administered 2022-11-16: 4 mg via INTRAVENOUS

## 2022-11-16 MED ORDER — ACETAMINOPHEN 325 MG PO TABS: 325.0000 mg | ORAL_TABLET | ORAL | Status: DC | PRN

## 2022-11-16 MED ORDER — PROPOFOL 500 MG/50ML IV EMUL
INTRAVENOUS | Status: DC | PRN
  Administered 2022-11-16: 200 ug/kg/min via INTRAVENOUS

## 2022-11-16 SURGICAL SUPPLY — 22 items
BLADE CLIPPER SENSICLIP SURGIC (BLADE) ×2 IMPLANT
CNTNR URN SCR LID CUP LEK RST (MISCELLANEOUS) ×2 IMPLANT
CONT SPEC 4OZ STRL OR WHT (MISCELLANEOUS) ×2
COVER BACK TABLE 60X90IN (DRAPES) ×2 IMPLANT
DRSG TEGADERM 4X4.75 (GAUZE/BANDAGES/DRESSINGS) ×2 IMPLANT
DRSG TEGADERM 8X12 (GAUZE/BANDAGES/DRESSINGS) ×2 IMPLANT
GAUZE SPONGE 4X4 12PLY STRL (GAUZE/BANDAGES/DRESSINGS) ×2 IMPLANT
GLOVE BIOGEL PI IND STRL 6.5 (GLOVE) IMPLANT
GLOVE BIOGEL PI IND STRL 7.0 (GLOVE) IMPLANT
GLOVE SURG SS PI 8.0 STRL IVOR (GLOVE) ×2 IMPLANT
KIT TURNOVER CYSTO (KITS) ×2 IMPLANT
MARKER GOLD PRELOAD 1.2X3 (Urological Implant) ×2 IMPLANT
MARKER SKIN DUAL TIP RULER LAB (MISCELLANEOUS) ×2 IMPLANT
NDL SPNL 22GX3.5 QUINCKE BK (NEEDLE) ×2 IMPLANT
NEEDLE SPNL 22GX3.5 QUINCKE BK (NEEDLE) ×2 IMPLANT
SEED GOLD PRELOAD 1.2X3 (Urological Implant) ×2 IMPLANT
SHEATH ULTRASOUND LTX NONSTRL (SHEATH) IMPLANT
SPIKE FLUID TRANSFER (MISCELLANEOUS) IMPLANT
SURGILUBE 2OZ TUBE FLIPTOP (MISCELLANEOUS) ×2 IMPLANT
SYR CONTROL 10ML LL (SYRINGE) ×2 IMPLANT
TOWEL OR 17X26 10 PK STRL BLUE (TOWEL DISPOSABLE) ×2 IMPLANT
UNDERPAD 30X36 HEAVY ABSORB (UNDERPADS AND DIAPERS) ×2 IMPLANT

## 2022-11-16 NOTE — Op Note (Signed)
Procedure: 1.  Transrectal ultrasound-guided placement of fiducial markers.    Preop diagnosis: Prostate cancer.   Postop diagnosis: Same.   Surgeon: Dr. Irine Seal.   Anesthesia: MAC and local.   Specimen: None   EBL: None.   Drain: None.   Complications: None. . Indications: The patient is a 71 year old male with history of prostate cancer he is to undergo radiation therapy and in preparation is to have fiducial markers and SpaceOAR gel placed.   Procedure: He was given Cipro for antibiotic coverage.  He was taken the operating room was placed in lithotomy position and given sedation as needed.  His perineum was clipped and the scrotum was reflected superiorly with an OpSite.  Perineum was prepped with Betadine solution.   The B and K transrectal ultrasound probe was assembled and inserted after generous lubrication.  The prostate was visualized in the sagittal and transverse planes.   The mid perineum was infiltrated with 48m of 2% lidocaine from the skin level down to the prostatic apex.   The fiducial markers were then placed under ultrasound guidance into the right base medial prostate, the right apex and the left mid lateral prostate.    The ultrasound probe was removed and he was taken down from the lithotomy position and moved recovery room in stable condition after the wound was cleansed and a dressing was placed.

## 2022-11-16 NOTE — Anesthesia Procedure Notes (Signed)
Procedure Name: MAC Date/Time: 11/16/2022 11:12 AM  Performed by: Justice Rocher, CRNAPre-anesthesia Checklist: Timeout performed, Patient being monitored, Suction available, Emergency Drugs available and Patient identified Patient Re-evaluated:Patient Re-evaluated prior to induction Oxygen Delivery Method: Simple face mask Preoxygenation: Pre-oxygenation with 100% oxygen Induction Type: IV induction Placement Confirmation: breath sounds checked- equal and bilateral, CO2 detector and positive ETCO2

## 2022-11-16 NOTE — Anesthesia Postprocedure Evaluation (Signed)
Anesthesia Post Note  Patient: Shane [name]  Procedure(s) Performed: GOLD SEED IMPLANT INTO PROSTATE (Prostate) TRANSRECTAL ULTRASOUND (Rectum)     Patient location during evaluation: PACU Anesthesia Type: MAC Level of consciousness: awake and alert Pain management: pain level controlled Vital Signs Assessment: post-procedure vital signs reviewed and stable Respiratory status: spontaneous breathing, nonlabored ventilation, respiratory function stable and patient connected to nasal cannula oxygen Cardiovascular status: stable and blood pressure returned to baseline Postop Assessment: no apparent nausea or vomiting Anesthetic complications: no   No notable events documented.  Last Vitals:  Vitals:   11/16/22 1235 11/16/22 1255  BP:  (!) 170/85  Pulse: 62 69  Resp:  16  Temp:    SpO2: 100% 100%    Last Pain:  Vitals:   11/16/22 1255  TempSrc:   PainSc: 0-No pain                 Effie Berkshire

## 2022-11-16 NOTE — Interval H&P Note (Signed)
History and Physical Interval Note:  As planned, he remains on Eliquis and we discussed what to look out for as far as bleeding is concerned.   11/16/2022 8:16 AM  Shane [name]  has presented today for surgery, with the diagnosis of PROSTATE CANCER.  The various methods of treatment have been discussed with the patient and family. After consideration of risks, benefits and other options for treatment, the patient has consented to  Procedure(s): GOLD SEED IMPLANT INTO PROSTATE (N/A) TRANSRECTAL ULTRASOUND (N/A) as a surgical intervention.  The patient's history has been reviewed, patient examined, no change in status, stable for surgery.  I have reviewed the patient's chart and labs.  Questions were answered to the patient's satisfaction.     Irine Seal

## 2022-11-16 NOTE — Transfer of Care (Signed)
Immediate Anesthesia Transfer of Care Note  Patient: Shane [name]  Procedure(s) Performed: Procedure(s) (LRB): GOLD SEED IMPLANT INTO PROSTATE (N/A) TRANSRECTAL ULTRASOUND (N/A)  Patient Location: PACU  Anesthesia Type: General  Level of Consciousness: awake, sedated, patient cooperative and responds to stimulation  Airway & Oxygen Therapy: Patient Spontanous Breathing and Patient connected to face mask oxygen  Post-op Assessment: Report given to PACU RN, Post -op Vital signs reviewed and stable and Patient moving all extremities  Post vital signs: Reviewed and stable  Complications: No apparent anesthesia complications

## 2022-11-16 NOTE — Discharge Instructions (Signed)

## 2022-11-16 NOTE — Anesthesia Preprocedure Evaluation (Addendum)
Anesthesia Evaluation  Patient identified by MRN, date of birth, ID band Patient awake    Reviewed: Allergy & Precautions, NPO status , Patient's Chart, lab work & pertinent test results  Airway Mallampati: II  TM Distance: >3 FB Neck ROM: Full    Dental  (+) Teeth Intact, Dental Advisory Given   Pulmonary PE   breath sounds clear to auscultation       Cardiovascular hypertension, + CAD   Rhythm:Regular Rate:Normal     Neuro/Psych  PSYCHIATRIC DISORDERS      negative neurological ROS     GI/Hepatic negative GI ROS, Neg liver ROS,,,  Endo/Other  negative endocrine ROS    Renal/GU Renal disease     Musculoskeletal negative musculoskeletal ROS (+)    Abdominal   Peds  Hematology negative hematology ROS (+)   Anesthesia Other Findings - Prostate CA  Reproductive/Obstetrics                             Anesthesia Physical Anesthesia Plan  ASA: 3  Anesthesia Plan: MAC   Post-op Pain Management:    Induction: Intravenous  PONV Risk Score and Plan: 0 and Propofol infusion  Airway Management Planned: Simple Face Mask and Natural Airway  Additional Equipment: None  Intra-op Plan:   Post-operative Plan:   Informed Consent: I have reviewed the patients History and Physical, chart, labs and discussed the procedure including the risks, benefits and alternatives for the proposed anesthesia with the patient or authorized representative who has indicated his/her understanding and acceptance.       Plan Discussed with: CRNA  Anesthesia Plan Comments:        Anesthesia Quick Evaluation

## 2022-11-18 ENCOUNTER — Telehealth: Payer: Self-pay | Admitting: *Deleted

## 2022-11-18 ENCOUNTER — Encounter (HOSPITAL_BASED_OUTPATIENT_CLINIC_OR_DEPARTMENT_OTHER): Payer: Self-pay | Admitting: Urology

## 2022-11-18 NOTE — Telephone Encounter (Signed)
CALLED PATIENT TO REMIND OF SIM APPT. FOR 11-19-22- ARRIVAL TIME- 2:45 PM @ [facility], INFORMED PATIENT TO ARRIVE WITH A FULL BLADDER, LVM FOR A RETURN CALL

## 2022-11-19 ENCOUNTER — Ambulatory Visit
Admission: RE | Admit: 2022-11-19 | Discharge: 2022-11-19 | Disposition: A | Discharge status: Home / Self Care | Payer: PPO | Source: Ambulatory Visit | Attending: Radiation Oncology | Admitting: Radiation Oncology

## 2022-11-19 ENCOUNTER — Other Ambulatory Visit: Payer: Self-pay

## 2022-11-19 DIAGNOSIS — C61 Malignant neoplasm of prostate: Secondary | ICD-10-CM | POA: Diagnosis not present

## 2022-11-19 DIAGNOSIS — Z191 Hormone sensitive malignancy status: Secondary | ICD-10-CM | POA: Diagnosis not present

## 2022-11-19 NOTE — Progress Notes (Signed)
  Radiation Oncology         [phone] ________________________________  Name: Shane [name] MRN: [id number]  Date: 11/19/2022  DOB: 06/28/1951  SIMULATION AND TREATMENT PLANNING NOTE    ICD-10-CM   1. Malignant neoplasm of prostate ([place])  C61       DIAGNOSIS:  71 year old male with advanced prostate cancer, T4N1Mb, Gleason 5+4 and PSA of 2.7 at diagnosis in 2020, with excellent treatment response and currently only evidence of disease activity remaining in the prostate.  NARRATIVE:  The patient was brought to the [place].  Identity was confirmed.  All relevant records and images related to the planned course of therapy were reviewed.  The patient freely provided informed written consent to proceed with treatment after reviewing the details related to the planned course of therapy. The consent form was witnessed and verified by the simulation staff.  Then, the patient was set-up in a stable reproducible supine position for radiation therapy.  A vacuum lock pillow device was custom fabricated to position his legs in a reproducible immobilized position.  Then, I performed a urethrogram under sterile conditions to identify the prostatic apex.  CT images were obtained.  Surface markings were placed.  The CT images were loaded into the planning software.  Then the prostate target and avoidance structures including the rectum, bladder, bowel and hips were contoured.  Treatment planning then occurred.  The radiation prescription was entered and confirmed.  A total of one complex treatment devices was fabricated. I have requested : Intensity Modulated Radiotherapy (IMRT) is medically necessary for this case for the following reason:  Rectal sparing.Marland Kitchen  PLAN:  The patient will receive 70 Gy in 28 fractions.  ________________________________  Sheral Apley Tammi Klippel, M.D.

## 2022-11-22 ENCOUNTER — Telehealth: Payer: Self-pay | Admitting: Oncology

## 2022-11-22 NOTE — Telephone Encounter (Signed)
Called patient regarding upcoming January appointments, patient is notified. 

## 2022-11-23 ENCOUNTER — Other Ambulatory Visit (HOSPITAL_COMMUNITY): Payer: Self-pay

## 2022-11-23 ENCOUNTER — Other Ambulatory Visit: Payer: Self-pay | Admitting: Oncology

## 2022-11-23 DIAGNOSIS — C61 Malignant neoplasm of prostate: Secondary | ICD-10-CM | POA: Diagnosis not present

## 2022-11-23 DIAGNOSIS — Z191 Hormone sensitive malignancy status: Secondary | ICD-10-CM | POA: Diagnosis not present

## 2022-11-23 MED ORDER — ABIRATERONE ACETATE 250 MG PO TABS
1000.0000 mg | ORAL_TABLET | Freq: Every day | ORAL | 10 refills | Status: DC
  Filled 2022-11-23: qty 120, 30d supply, fill #0

## 2022-11-24 ENCOUNTER — Other Ambulatory Visit: Payer: Self-pay | Admitting: Family Medicine

## 2022-11-24 NOTE — Telephone Encounter (Signed)
Eliquis Last filled:  10/07/22, #60 Last OV:  11/10/22, 6 mo f/u Next OV:  05/17/23, CPE

## 2022-11-30 ENCOUNTER — Other Ambulatory Visit (HOSPITAL_COMMUNITY): Payer: Self-pay

## 2022-11-30 DIAGNOSIS — C61 Malignant neoplasm of prostate: Secondary | ICD-10-CM | POA: Diagnosis not present

## 2022-11-30 DIAGNOSIS — Z191 Hormone sensitive malignancy status: Secondary | ICD-10-CM | POA: Diagnosis not present

## 2022-12-01 ENCOUNTER — Telehealth: Payer: Self-pay | Admitting: Oncology

## 2022-12-01 NOTE — Telephone Encounter (Signed)
Rescheduled 01/16 appointment time per provider on-call. Patient is notified of upcoming schedule change.

## 2022-12-07 ENCOUNTER — Other Ambulatory Visit: Payer: Self-pay

## 2022-12-07 ENCOUNTER — Ambulatory Visit
Admission: RE | Admit: 2022-12-07 | Discharge: 2022-12-07 | Disposition: A | Discharge status: Home / Self Care | Payer: PPO | Source: Ambulatory Visit | Attending: Radiation Oncology | Admitting: Radiation Oncology

## 2022-12-07 DIAGNOSIS — Z510 Encounter for antineoplastic radiation therapy: Secondary | ICD-10-CM | POA: Diagnosis not present

## 2022-12-07 DIAGNOSIS — C61 Malignant neoplasm of prostate: Secondary | ICD-10-CM | POA: Diagnosis not present

## 2022-12-07 DIAGNOSIS — Z191 Hormone sensitive malignancy status: Secondary | ICD-10-CM | POA: Diagnosis not present

## 2022-12-07 LAB — RAD ONC ARIA SESSION SUMMARY
Course Elapsed Days: 0
Plan Fractions Treated to Date: 1
Plan Prescribed Dose Per Fraction: 2.75 Gy
Plan Total Fractions Prescribed: 20
Plan Total Prescribed Dose: 55 Gy
Reference Point Dosage Given to Date: 2.75 Gy
Reference Point Session Dosage Given: 2.75 Gy
Session Number: 1

## 2022-12-08 ENCOUNTER — Ambulatory Visit
Admission: RE | Admit: 2022-12-08 | Discharge: 2022-12-08 | Disposition: A | Discharge status: Home / Self Care | Payer: PPO | Source: Ambulatory Visit | Attending: Radiation Oncology | Admitting: Radiation Oncology

## 2022-12-08 ENCOUNTER — Other Ambulatory Visit: Payer: Self-pay

## 2022-12-08 DIAGNOSIS — C61 Malignant neoplasm of prostate: Secondary | ICD-10-CM | POA: Diagnosis not present

## 2022-12-08 DIAGNOSIS — Z191 Hormone sensitive malignancy status: Secondary | ICD-10-CM | POA: Diagnosis not present

## 2022-12-08 DIAGNOSIS — Z510 Encounter for antineoplastic radiation therapy: Secondary | ICD-10-CM | POA: Diagnosis not present

## 2022-12-08 LAB — RAD ONC ARIA SESSION SUMMARY
Course Elapsed Days: 1
Plan Fractions Treated to Date: 2
Plan Prescribed Dose Per Fraction: 2.75 Gy
Plan Total Fractions Prescribed: 20
Plan Total Prescribed Dose: 55 Gy
Reference Point Dosage Given to Date: 5.5 Gy
Reference Point Session Dosage Given: 2.75 Gy
Session Number: 2

## 2022-12-09 ENCOUNTER — Encounter: Payer: Self-pay | Admitting: Oncology

## 2022-12-09 ENCOUNTER — Other Ambulatory Visit: Payer: Self-pay

## 2022-12-09 ENCOUNTER — Ambulatory Visit
Admission: RE | Admit: 2022-12-09 | Discharge: 2022-12-09 | Disposition: A | Discharge status: Home / Self Care | Payer: PPO | Source: Ambulatory Visit | Attending: Radiation Oncology | Admitting: Radiation Oncology

## 2022-12-09 ENCOUNTER — Other Ambulatory Visit (HOSPITAL_COMMUNITY): Payer: Self-pay

## 2022-12-09 ENCOUNTER — Telehealth: Payer: Self-pay | Admitting: Pharmacy Technician

## 2022-12-09 DIAGNOSIS — Z191 Hormone sensitive malignancy status: Secondary | ICD-10-CM | POA: Diagnosis not present

## 2022-12-09 DIAGNOSIS — Z510 Encounter for antineoplastic radiation therapy: Secondary | ICD-10-CM | POA: Diagnosis not present

## 2022-12-09 DIAGNOSIS — C61 Malignant neoplasm of prostate: Secondary | ICD-10-CM | POA: Diagnosis not present

## 2022-12-09 LAB — RAD ONC ARIA SESSION SUMMARY
Course Elapsed Days: 2
Plan Fractions Treated to Date: 3
Plan Prescribed Dose Per Fraction: 2.75 Gy
Plan Total Fractions Prescribed: 20
Plan Total Prescribed Dose: 55 Gy
Reference Point Dosage Given to Date: 8.25 Gy
Reference Point Session Dosage Given: 2.75 Gy
Session Number: 3

## 2022-12-09 NOTE — Telephone Encounter (Signed)
Oral Oncology Patient Advocate Encounter   Was successful in securing patient an $3,250 grant from Patient [place] [facility]) to provide copayment coverage for Abiraterone.  This will keep the out of pocket expense at $0.     I have spoken with the patient.    The billing information is as follows and has been shared with [place].   Member ID: [id number] Group ID: [id number] RxBin: [id number] Dates of Eligibility: 10/07/22 through 10/09/23  Fund:  [place], [place] Patient [place] Direct Number: [phone]  Fax: [phone]

## 2022-12-10 ENCOUNTER — Ambulatory Visit
Admission: RE | Admit: 2022-12-10 | Discharge: 2022-12-10 | Disposition: A | Discharge status: Home / Self Care | Payer: PPO | Source: Ambulatory Visit | Attending: Radiation Oncology | Admitting: Radiation Oncology

## 2022-12-10 ENCOUNTER — Other Ambulatory Visit: Payer: Self-pay

## 2022-12-10 DIAGNOSIS — C61 Malignant neoplasm of prostate: Secondary | ICD-10-CM | POA: Diagnosis not present

## 2022-12-10 DIAGNOSIS — Z191 Hormone sensitive malignancy status: Secondary | ICD-10-CM | POA: Diagnosis not present

## 2022-12-10 DIAGNOSIS — Z510 Encounter for antineoplastic radiation therapy: Secondary | ICD-10-CM | POA: Diagnosis not present

## 2022-12-10 LAB — RAD ONC ARIA SESSION SUMMARY
Course Elapsed Days: 3
Plan Fractions Treated to Date: 4
Plan Prescribed Dose Per Fraction: 2.75 Gy
Plan Total Fractions Prescribed: 20
Plan Total Prescribed Dose: 55 Gy
Reference Point Dosage Given to Date: 11 Gy
Reference Point Session Dosage Given: 2.75 Gy
Session Number: 4

## 2022-12-13 ENCOUNTER — Other Ambulatory Visit: Payer: Self-pay

## 2022-12-13 ENCOUNTER — Ambulatory Visit
Admission: RE | Admit: 2022-12-13 | Discharge: 2022-12-13 | Disposition: A | Discharge status: Home / Self Care | Payer: PPO | Source: Ambulatory Visit | Attending: Radiation Oncology | Admitting: Radiation Oncology

## 2022-12-13 DIAGNOSIS — C61 Malignant neoplasm of prostate: Secondary | ICD-10-CM | POA: Diagnosis not present

## 2022-12-13 DIAGNOSIS — Z191 Hormone sensitive malignancy status: Secondary | ICD-10-CM | POA: Diagnosis not present

## 2022-12-13 DIAGNOSIS — Z510 Encounter for antineoplastic radiation therapy: Secondary | ICD-10-CM | POA: Diagnosis not present

## 2022-12-13 LAB — RAD ONC ARIA SESSION SUMMARY
Course Elapsed Days: 6
Plan Fractions Treated to Date: 5
Plan Prescribed Dose Per Fraction: 2.75 Gy
Plan Total Fractions Prescribed: 20
Plan Total Prescribed Dose: 55 Gy
Reference Point Dosage Given to Date: 13.75 Gy
Reference Point Session Dosage Given: 2.75 Gy
Session Number: 5

## 2022-12-14 ENCOUNTER — Ambulatory Visit
Admission: RE | Admit: 2022-12-14 | Discharge: 2022-12-14 | Disposition: A | Discharge status: Home / Self Care | Payer: PPO | Source: Ambulatory Visit | Attending: Radiation Oncology | Admitting: Radiation Oncology

## 2022-12-14 ENCOUNTER — Other Ambulatory Visit: Payer: Self-pay

## 2022-12-14 DIAGNOSIS — Z510 Encounter for antineoplastic radiation therapy: Secondary | ICD-10-CM | POA: Diagnosis not present

## 2022-12-14 DIAGNOSIS — Z191 Hormone sensitive malignancy status: Secondary | ICD-10-CM | POA: Diagnosis not present

## 2022-12-14 DIAGNOSIS — C61 Malignant neoplasm of prostate: Secondary | ICD-10-CM | POA: Diagnosis not present

## 2022-12-14 LAB — RAD ONC ARIA SESSION SUMMARY
Course Elapsed Days: 7
Plan Fractions Treated to Date: 6
Plan Prescribed Dose Per Fraction: 2.75 Gy
Plan Total Fractions Prescribed: 20
Plan Total Prescribed Dose: 55 Gy
Reference Point Dosage Given to Date: 16.5 Gy
Reference Point Session Dosage Given: 2.75 Gy
Session Number: 6

## 2022-12-15 ENCOUNTER — Ambulatory Visit
Admission: RE | Admit: 2022-12-15 | Discharge: 2022-12-15 | Disposition: A | Discharge status: Home / Self Care | Payer: PPO | Source: Ambulatory Visit | Attending: Radiation Oncology | Admitting: Radiation Oncology

## 2022-12-15 ENCOUNTER — Other Ambulatory Visit: Payer: Self-pay

## 2022-12-15 DIAGNOSIS — Z191 Hormone sensitive malignancy status: Secondary | ICD-10-CM | POA: Diagnosis not present

## 2022-12-15 DIAGNOSIS — Z510 Encounter for antineoplastic radiation therapy: Secondary | ICD-10-CM | POA: Diagnosis not present

## 2022-12-15 DIAGNOSIS — C61 Malignant neoplasm of prostate: Secondary | ICD-10-CM | POA: Diagnosis not present

## 2022-12-15 LAB — RAD ONC ARIA SESSION SUMMARY
Course Elapsed Days: 8
Plan Fractions Treated to Date: 7
Plan Prescribed Dose Per Fraction: 2.75 Gy
Plan Total Fractions Prescribed: 20
Plan Total Prescribed Dose: 55 Gy
Reference Point Dosage Given to Date: 19.25 Gy
Reference Point Session Dosage Given: 2.75 Gy
Session Number: 7

## 2022-12-16 ENCOUNTER — Other Ambulatory Visit: Payer: Self-pay

## 2022-12-16 ENCOUNTER — Ambulatory Visit
Admission: RE | Admit: 2022-12-16 | Discharge: 2022-12-16 | Disposition: A | Discharge status: Home / Self Care | Payer: PPO | Source: Ambulatory Visit | Attending: Radiation Oncology | Admitting: Radiation Oncology

## 2022-12-16 DIAGNOSIS — Z191 Hormone sensitive malignancy status: Secondary | ICD-10-CM | POA: Diagnosis not present

## 2022-12-16 DIAGNOSIS — C61 Malignant neoplasm of prostate: Secondary | ICD-10-CM | POA: Diagnosis not present

## 2022-12-16 DIAGNOSIS — Z510 Encounter for antineoplastic radiation therapy: Secondary | ICD-10-CM | POA: Diagnosis not present

## 2022-12-16 LAB — RAD ONC ARIA SESSION SUMMARY
Course Elapsed Days: 9
Plan Fractions Treated to Date: 8
Plan Prescribed Dose Per Fraction: 2.75 Gy
Plan Total Fractions Prescribed: 20
Plan Total Prescribed Dose: 55 Gy
Reference Point Dosage Given to Date: 22 Gy
Reference Point Session Dosage Given: 2.75 Gy
Session Number: 8

## 2022-12-17 ENCOUNTER — Other Ambulatory Visit: Payer: Self-pay

## 2022-12-17 ENCOUNTER — Ambulatory Visit
Admission: RE | Admit: 2022-12-17 | Discharge: 2022-12-17 | Disposition: A | Discharge status: Home / Self Care | Payer: PPO | Source: Ambulatory Visit | Attending: Radiation Oncology | Admitting: Radiation Oncology

## 2022-12-17 DIAGNOSIS — Z191 Hormone sensitive malignancy status: Secondary | ICD-10-CM | POA: Diagnosis not present

## 2022-12-17 DIAGNOSIS — Z510 Encounter for antineoplastic radiation therapy: Secondary | ICD-10-CM | POA: Diagnosis not present

## 2022-12-17 DIAGNOSIS — C61 Malignant neoplasm of prostate: Secondary | ICD-10-CM | POA: Diagnosis not present

## 2022-12-17 LAB — RAD ONC ARIA SESSION SUMMARY
Course Elapsed Days: 10
Plan Fractions Treated to Date: 9
Plan Prescribed Dose Per Fraction: 2.75 Gy
Plan Total Fractions Prescribed: 20
Plan Total Prescribed Dose: 55 Gy
Reference Point Dosage Given to Date: 24.75 Gy
Reference Point Session Dosage Given: 2.75 Gy
Session Number: 9

## 2022-12-20 ENCOUNTER — Other Ambulatory Visit: Payer: Self-pay

## 2022-12-20 ENCOUNTER — Ambulatory Visit
Admission: RE | Admit: 2022-12-20 | Discharge: 2022-12-20 | Disposition: A | Discharge status: Home / Self Care | Payer: PPO | Source: Ambulatory Visit | Attending: Radiation Oncology | Admitting: Radiation Oncology

## 2022-12-20 DIAGNOSIS — C61 Malignant neoplasm of prostate: Secondary | ICD-10-CM | POA: Diagnosis not present

## 2022-12-20 DIAGNOSIS — Z510 Encounter for antineoplastic radiation therapy: Secondary | ICD-10-CM | POA: Diagnosis not present

## 2022-12-20 DIAGNOSIS — Z191 Hormone sensitive malignancy status: Secondary | ICD-10-CM | POA: Diagnosis not present

## 2022-12-20 LAB — RAD ONC ARIA SESSION SUMMARY
Course Elapsed Days: 13
Plan Fractions Treated to Date: 10
Plan Prescribed Dose Per Fraction: 2.75 Gy
Plan Total Fractions Prescribed: 20
Plan Total Prescribed Dose: 55 Gy
Reference Point Dosage Given to Date: 27.5 Gy
Reference Point Session Dosage Given: 2.75 Gy
Session Number: 10

## 2022-12-21 ENCOUNTER — Inpatient Hospital Stay: Payer: PPO

## 2022-12-21 ENCOUNTER — Inpatient Hospital Stay (HOSPITAL_BASED_OUTPATIENT_CLINIC_OR_DEPARTMENT_OTHER): Payer: PPO | Admitting: Oncology

## 2022-12-21 ENCOUNTER — Other Ambulatory Visit: Payer: Self-pay

## 2022-12-21 ENCOUNTER — Ambulatory Visit
Admission: RE | Admit: 2022-12-21 | Discharge: 2022-12-21 | Disposition: A | Discharge status: Home / Self Care | Payer: PPO | Source: Ambulatory Visit | Attending: Radiation Oncology | Admitting: Radiation Oncology

## 2022-12-21 VITALS — BP 112/62 | HR 66 | Temp 97.9°F | Resp 17 | Ht 71.0 in | Wt 208.1 lb

## 2022-12-21 DIAGNOSIS — Z7901 Long term (current) use of anticoagulants: Secondary | ICD-10-CM | POA: Insufficient documentation

## 2022-12-21 DIAGNOSIS — Z5111 Encounter for antineoplastic chemotherapy: Secondary | ICD-10-CM | POA: Insufficient documentation

## 2022-12-21 DIAGNOSIS — C61 Malignant neoplasm of prostate: Secondary | ICD-10-CM

## 2022-12-21 DIAGNOSIS — I10 Essential (primary) hypertension: Secondary | ICD-10-CM | POA: Insufficient documentation

## 2022-12-21 DIAGNOSIS — I2699 Other pulmonary embolism without acute cor pulmonale: Secondary | ICD-10-CM | POA: Insufficient documentation

## 2022-12-21 DIAGNOSIS — R918 Other nonspecific abnormal finding of lung field: Secondary | ICD-10-CM | POA: Insufficient documentation

## 2022-12-21 DIAGNOSIS — Z510 Encounter for antineoplastic radiation therapy: Secondary | ICD-10-CM | POA: Diagnosis not present

## 2022-12-21 DIAGNOSIS — Z191 Hormone sensitive malignancy status: Secondary | ICD-10-CM | POA: Diagnosis not present

## 2022-12-21 LAB — RAD ONC ARIA SESSION SUMMARY
Course Elapsed Days: 14
Plan Fractions Treated to Date: 11
Plan Prescribed Dose Per Fraction: 2.75 Gy
Plan Total Fractions Prescribed: 20
Plan Total Prescribed Dose: 55 Gy
Reference Point Dosage Given to Date: 30.25 Gy
Reference Point Session Dosage Given: 2.75 Gy
Session Number: 11

## 2022-12-21 LAB — CMP (CANCER CENTER ONLY)
ALT: 21 U/L (ref 0–44)
AST: 24 U/L (ref 15–41)
Albumin: 4 g/dL (ref 3.5–5.0)
Alkaline Phosphatase: 42 U/L (ref 38–126)
Anion gap: 6 (ref 5–15)
BUN: 17 mg/dL (ref 8–23)
CO2: 29 mmol/L (ref 22–32)
Calcium: 9.9 mg/dL (ref 8.9–10.3)
Chloride: 106 mmol/L (ref 98–111)
Creatinine: 1.09 mg/dL (ref 0.61–1.24)
GFR, Estimated: >60 mL/min (ref >60)
Glucose, Bld: 88 mg/dL (ref 70–99)
Potassium: 3.7 mmol/L (ref 3.5–5.1)
Sodium: 141 mmol/L (ref 135–145)
Total Bilirubin: 0.7 mg/dL (ref 0.3–1.2)
Total Protein: 6.7 g/dL (ref 6.5–8.1)

## 2022-12-21 LAB — CBC WITH DIFFERENTIAL (CANCER CENTER ONLY)
Abs Immature Granulocytes: 0.05 10*3/uL (ref 0.00–0.07)
Basophils Absolute: 0 10*3/uL (ref 0.0–0.1)
Basophils Relative: 1 %
Eosinophils Absolute: 0.3 10*3/uL (ref 0.0–0.5)
Eosinophils Relative: 7 %
HCT: 37.4 % — ABNORMAL LOW (ref 39.0–52.0)
Hemoglobin: 13 g/dL (ref 13.0–17.0)
Immature Granulocytes: 1 %
Lymphocytes Relative: 14 %
Lymphs Abs: 0.6 10*3/uL — ABNORMAL LOW (ref 0.7–4.0)
MCH: 33.4 pg (ref 26.0–34.0)
MCHC: 34.8 g/dL (ref 30.0–36.0)
MCV: 96.1 fL (ref 80.0–100.0)
Monocytes Absolute: 0.4 10*3/uL (ref 0.1–1.0)
Monocytes Relative: 10 %
Neutro Abs: 2.8 10*3/uL (ref 1.7–7.7)
Neutrophils Relative %: 67 %
Platelet Count: 148 10*3/uL — ABNORMAL LOW (ref 150–400)
RBC: 3.89 MIL/uL — ABNORMAL LOW (ref 4.22–5.81)
RDW: 12 % (ref 11.5–15.5)
WBC Count: 4.2 10*3/uL (ref 4.0–10.5)
nRBC: 0 % (ref 0.0–0.2)

## 2022-12-21 MED ORDER — LEUPROLIDE ACETATE (4 MONTH) 30 MG ~~LOC~~ KIT
30.0000 mg | PACK | Freq: Once | SUBCUTANEOUS | Status: AC
  Administered 2022-12-21: 30 mg via SUBCUTANEOUS
  Filled 2022-12-21: qty 30

## 2022-12-21 NOTE — Progress Notes (Signed)
Hematology and Oncology Follow Up Visit  Shane [name] [id number] [birth year] 72 y.o. 12/21/2022 8:59 AM [name], MDGutierrez, [name], MD   Principle Diagnosis: 72 year old man with castration-sensitive advanced prostate cancer diagnosed in 2020.  He was found to have Gleason score of 5+4 = 9 with abnormal bone scan as well as CT scan confirming lymphadenopathy.  He achieved a complete response based on PSMA PET scan in October of 2023.   Prior Therapy:  He is status post TURBT and stent placement bilaterally the pathology showed Gleason score 5+4 = 9 prostate cancer invading into the bladder in 2020.  Current therapy:   Eligard 30 mg every 4 months.  Next injection will be given today and repeated in 4 months.  Zytiga 1000 mg daily with prednisone 5 mg started in September 2020.  Definitive therapy with radiation to the prostate with plan to receive 60 Gray in 20 fractions to the prostate at 24 Gray in 20 fractions to the pelvic lymph nodes.  This was started in December 2023 and currently ongoing.  Interim History: Shane [name] returns today for a follow-up visit.  Since the last visit, he reports feeling well without any major complaints.  He has started radiation therapy without any issues currently.  He denies any nausea, vomiting or abdominal pain.  He does report some mild diarrhea but no other complaints.  He does report some fatigue and tiredness associated with Zytiga but no other complaints.      Medications: Reviewed without changes. Current Outpatient Medications  Medication Sig Dispense Refill   abiraterone acetate (ZYTIGA) 250 MG tablet Take 4 tablets (1,000 mg total) by mouth daily. Take on an empty stomach 1 hour before or 2 hours after a meal 120 tablet 10   acetaminophen (TYLENOL) 500 MG tablet Take 500 mg by mouth every 6 (six) hours as needed.     Calcium-Magnesium-Vitamin D (CITRACAL CALCIUM+D) 600-40-500 MG-MG-UNIT TB24 Take 1 tablet by mouth daily.      ELIQUIS 5 MG TABS tablet TAKE ONE TABLET BY MOUTH TWICE A DAY 60 tablet 6   Leuprolide Acetate, 4 Month, (ELIGARD) 30 MG injection Inject 30 mg into the skin every 4 (four) months. Per pt done at cancer center     predniSONE (DELTASONE) 5 MG tablet TAKE 1 TABLET BY MOUTH DAILY WITH BREAKFAST 90 tablet 0   QUEtiapine (SEROQUEL) 100 MG tablet Take 100 mg by mouth at bedtime.     No current facility-administered medications for this visit.     Allergies:  Allergies  Allergen Reactions   Effexor Xr [Venlafaxine Hcl Er] Other (See Comments)    Psychiatric symptoms   Hydrocodone Itching and Other (See Comments)    Per pt turns red from chest up, looks like sunburn and starts itching badly   Oxycodone Itching and Other (See Comments)    Per pt turns red from chest up, looks like sunburn and starts itching badly   Bactrim [Sulfamethoxazole-Trimethoprim] Hives and Rash   Penicillins Swelling and Rash    Did it involve swelling of the face/tongue/throat, SOB, or low BP? No Did it involve sudden or severe rash/hives, skin peeling, or any reaction on the inside of your mouth or nose? No Did you need to seek medical attention at a hospital or doctor's office? No When did it last happen?      50 years ago If all above answers are "NO", may proceed with cephalosporin use.    Vancomycin Hives and Rash   Zithromax [Azithromycin]  Rash        Physical Exam:     Blood pressure 112/62, pulse 66, temperature 97.9 F (36.6 C), temperature source Temporal, resp. rate 17, height '5\' 11"'$  (1.803 m), weight 208 lb 1.6 oz (94.4 kg), SpO2 99 %.         ECOG: 0      General appearance: Alert, awake without any distress. Head: Atraumatic without abnormalities Oropharynx: Without any thrush or ulcers. Eyes: No scleral icterus. Lymph nodes: No lymphadenopathy noted in the cervical, supraclavicular, or axillary nodes Heart:regular rate and rhythm, without any murmurs or gallops.   Lung:  Clear to auscultation without any rhonchi, wheezes or dullness to percussion. Abdomin: Soft, nontender without any shifting dullness or ascites. Musculoskeletal: No clubbing or cyanosis. Neurological: No motor or sensory deficits. Skin: No rashes or lesions.              Lab Results: Lab Results  Component Value Date   WBC 4.8 08/31/2022   HGB 12.9 (L) 11/16/2022   HCT 38.0 (L) 11/16/2022   MCV 97.2 08/31/2022   PLT 178 08/31/2022   PSA 0.05 (L) 04/24/2020     Chemistry      Component Value Date/Time   NA 141 11/16/2022 0755   NA 139 02/18/2021 1236   K 3.9 11/16/2022 0755   CL 104 11/16/2022 0755   CO2 28 08/31/2022 0815   BUN 17 11/16/2022 0755   BUN 22 02/18/2021 1236   CREATININE 1.10 11/16/2022 0755   CREATININE 1.33 (H) 08/31/2022 0815   GLU 113 01/08/2020 0000      Component Value Date/Time   CALCIUM 9.4 08/31/2022 0815   ALKPHOS 45 08/31/2022 0815   AST 30 08/31/2022 0815   ALT 30 08/31/2022 0815   BILITOT 0.8 08/31/2022 0815     IMPRESSION: 1. Fairly intense radiotracer activity within the prostate gland. 2. No evidence metastatic adenopathy in the pelvis or periaortic retroperitoneum. 3. No evidence of visceral metastasis or skeletal metastasis. 4. Benign RIGHT lobe pulmonary nodule.    Impression and Plan:   72 year old with:   1.   Castration-sensitive advanced prostate cancer with lymphadenopathy and positive bone scan diagnosed in July 2020.     He has achieved complete response to androgen deprivation and Zytiga with PSA that remains undetectable as well as uptake in the prostate gland only on a PSMA PET.  No evidence of metastatic disease.  Management options at this time were discussed and the role for systemic therapy as well as local therapy was reviewed.  Given his complete response, it is reasonable to de-escalate therapy from [facility] but I would like to continue androgen deprivation therapy if he is willing to.    After  discussion today, I recommended discontinuation of Zytiga and prednisone and continuing androgen deprivation therapy.  He understands that this strategy is unproven and restarting Zytiga or similar drug could be initiated in the future if his PSA continues to rise.   2.    Bone directed therapy: He remains on calcium and vitamin D supplements without any evidence of active bone disease at this time.   3.  Pulmonary embolism: He is currently on Eliquis without any bleeding or thrombosis.   4.  Androgen deprivation: Risks and benefits of doing Eligard were discussed at this time.  Complications that include weight gain, hot flashes and sexual dysfunction were discussed.  After discussion today, I recommended continuing this treatment for the time being.  This can be revisited may  be in the next year if he continues to have no evidence of residual disease.  My preference is to continue it indefinitely.   5.  Hypertension: His blood pressure is within normal range at this time.  6.  Local prostate cancer management: He is currently undergoing radiation therapy which will conclude towards the end of January 2024.   7.  Follow-up: In 4 months for repeat evaluation with a Eligard injection.   30 minutes were spent on this visit.  Time was dedicated to reviewing laboratory data, disease status update and outlining future plan of care discussion.  Zola Button, MD 1/16/20248:59 AM

## 2022-12-22 ENCOUNTER — Other Ambulatory Visit: Payer: Self-pay

## 2022-12-22 ENCOUNTER — Telehealth: Payer: Self-pay | Admitting: *Deleted

## 2022-12-22 ENCOUNTER — Ambulatory Visit
Admission: RE | Admit: 2022-12-22 | Discharge: 2022-12-22 | Disposition: A | Discharge status: Home / Self Care | Payer: PPO | Source: Ambulatory Visit | Attending: Radiation Oncology | Admitting: Radiation Oncology

## 2022-12-22 DIAGNOSIS — C61 Malignant neoplasm of prostate: Secondary | ICD-10-CM | POA: Diagnosis not present

## 2022-12-22 DIAGNOSIS — Z510 Encounter for antineoplastic radiation therapy: Secondary | ICD-10-CM | POA: Diagnosis not present

## 2022-12-22 DIAGNOSIS — Z191 Hormone sensitive malignancy status: Secondary | ICD-10-CM | POA: Diagnosis not present

## 2022-12-22 LAB — RAD ONC ARIA SESSION SUMMARY
Course Elapsed Days: 15
Plan Fractions Treated to Date: 12
Plan Prescribed Dose Per Fraction: 2.75 Gy
Plan Total Fractions Prescribed: 20
Plan Total Prescribed Dose: 55 Gy
Reference Point Dosage Given to Date: 33 Gy
Reference Point Session Dosage Given: 2.75 Gy
Session Number: 12

## 2022-12-22 LAB — PROSTATE-SPECIFIC AG, SERUM (LABCORP): Prostate Specific Ag, Serum: <0.1 ng/mL (ref 0.0–4.0)

## 2022-12-22 NOTE — Telephone Encounter (Signed)
PC to patient, no answer, left VM - informed patient of PSA results.  Instructed patient to call this office with any questions/concerns, [phone].

## 2022-12-22 NOTE — Telephone Encounter (Signed)
-----  Message from [name], MD sent at 12/22/2022  8:44 AM EST ----- Please let him know his PSA is down

## 2022-12-23 ENCOUNTER — Other Ambulatory Visit: Payer: Self-pay

## 2022-12-23 ENCOUNTER — Ambulatory Visit
Admission: RE | Admit: 2022-12-23 | Discharge: 2022-12-23 | Disposition: A | Discharge status: Home / Self Care | Payer: PPO | Source: Ambulatory Visit | Attending: Radiation Oncology | Admitting: Radiation Oncology

## 2022-12-23 ENCOUNTER — Other Ambulatory Visit (HOSPITAL_COMMUNITY): Payer: Self-pay

## 2022-12-23 ENCOUNTER — Ambulatory Visit: Payer: PPO

## 2022-12-23 DIAGNOSIS — C61 Malignant neoplasm of prostate: Secondary | ICD-10-CM | POA: Diagnosis not present

## 2022-12-23 DIAGNOSIS — Z510 Encounter for antineoplastic radiation therapy: Secondary | ICD-10-CM | POA: Diagnosis not present

## 2022-12-23 DIAGNOSIS — Z191 Hormone sensitive malignancy status: Secondary | ICD-10-CM | POA: Diagnosis not present

## 2022-12-23 LAB — RAD ONC ARIA SESSION SUMMARY
Course Elapsed Days: 16
Plan Fractions Treated to Date: 13
Plan Prescribed Dose Per Fraction: 2.75 Gy
Plan Total Fractions Prescribed: 20
Plan Total Prescribed Dose: 55 Gy
Reference Point Dosage Given to Date: 35.75 Gy
Reference Point Session Dosage Given: 2.75 Gy
Session Number: 13

## 2022-12-24 ENCOUNTER — Ambulatory Visit
Admission: RE | Admit: 2022-12-24 | Discharge: 2022-12-24 | Disposition: A | Discharge status: Home / Self Care | Payer: PPO | Source: Ambulatory Visit | Attending: Radiation Oncology | Admitting: Radiation Oncology

## 2022-12-24 ENCOUNTER — Other Ambulatory Visit: Payer: Self-pay

## 2022-12-24 DIAGNOSIS — Z191 Hormone sensitive malignancy status: Secondary | ICD-10-CM | POA: Diagnosis not present

## 2022-12-24 DIAGNOSIS — C61 Malignant neoplasm of prostate: Secondary | ICD-10-CM | POA: Diagnosis not present

## 2022-12-24 DIAGNOSIS — Z510 Encounter for antineoplastic radiation therapy: Secondary | ICD-10-CM | POA: Diagnosis not present

## 2022-12-24 LAB — RAD ONC ARIA SESSION SUMMARY
Course Elapsed Days: 17
Plan Fractions Treated to Date: 14
Plan Prescribed Dose Per Fraction: 2.75 Gy
Plan Total Fractions Prescribed: 20
Plan Total Prescribed Dose: 55 Gy
Reference Point Dosage Given to Date: 38.5 Gy
Reference Point Session Dosage Given: 2.75 Gy
Session Number: 14

## 2022-12-27 ENCOUNTER — Ambulatory Visit
Admission: RE | Admit: 2022-12-27 | Discharge: 2022-12-27 | Disposition: A | Discharge status: Home / Self Care | Payer: PPO | Source: Ambulatory Visit | Attending: Radiation Oncology | Admitting: Radiation Oncology

## 2022-12-27 ENCOUNTER — Other Ambulatory Visit: Payer: Self-pay

## 2022-12-27 DIAGNOSIS — Z191 Hormone sensitive malignancy status: Secondary | ICD-10-CM | POA: Diagnosis not present

## 2022-12-27 DIAGNOSIS — C61 Malignant neoplasm of prostate: Secondary | ICD-10-CM | POA: Diagnosis not present

## 2022-12-27 DIAGNOSIS — Z510 Encounter for antineoplastic radiation therapy: Secondary | ICD-10-CM | POA: Diagnosis not present

## 2022-12-27 LAB — RAD ONC ARIA SESSION SUMMARY
Course Elapsed Days: 20
Plan Fractions Treated to Date: 15
Plan Prescribed Dose Per Fraction: 2.75 Gy
Plan Total Fractions Prescribed: 20
Plan Total Prescribed Dose: 55 Gy
Reference Point Dosage Given to Date: 41.25 Gy
Reference Point Session Dosage Given: 2.75 Gy
Session Number: 15

## 2022-12-28 ENCOUNTER — Ambulatory Visit
Admission: RE | Admit: 2022-12-28 | Discharge: 2022-12-28 | Disposition: A | Discharge status: Home / Self Care | Payer: PPO | Source: Ambulatory Visit | Attending: Radiation Oncology | Admitting: Radiation Oncology

## 2022-12-28 ENCOUNTER — Other Ambulatory Visit: Payer: Self-pay

## 2022-12-28 DIAGNOSIS — Z510 Encounter for antineoplastic radiation therapy: Secondary | ICD-10-CM | POA: Diagnosis not present

## 2022-12-28 DIAGNOSIS — Z191 Hormone sensitive malignancy status: Secondary | ICD-10-CM | POA: Diagnosis not present

## 2022-12-28 DIAGNOSIS — C61 Malignant neoplasm of prostate: Secondary | ICD-10-CM | POA: Diagnosis not present

## 2022-12-28 LAB — RAD ONC ARIA SESSION SUMMARY
Course Elapsed Days: 21
Plan Fractions Treated to Date: 16
Plan Prescribed Dose Per Fraction: 2.75 Gy
Plan Total Fractions Prescribed: 20
Plan Total Prescribed Dose: 55 Gy
Reference Point Dosage Given to Date: 44 Gy
Reference Point Session Dosage Given: 2.75 Gy
Session Number: 16

## 2022-12-29 ENCOUNTER — Other Ambulatory Visit: Payer: Self-pay

## 2022-12-29 ENCOUNTER — Ambulatory Visit
Admission: RE | Admit: 2022-12-29 | Discharge: 2022-12-29 | Disposition: A | Discharge status: Home / Self Care | Payer: PPO | Source: Ambulatory Visit | Attending: Radiation Oncology | Admitting: Radiation Oncology

## 2022-12-29 DIAGNOSIS — Z191 Hormone sensitive malignancy status: Secondary | ICD-10-CM | POA: Diagnosis not present

## 2022-12-29 DIAGNOSIS — C61 Malignant neoplasm of prostate: Secondary | ICD-10-CM | POA: Diagnosis not present

## 2022-12-29 DIAGNOSIS — Z510 Encounter for antineoplastic radiation therapy: Secondary | ICD-10-CM | POA: Diagnosis not present

## 2022-12-29 LAB — RAD ONC ARIA SESSION SUMMARY
Course Elapsed Days: 22
Plan Fractions Treated to Date: 17
Plan Prescribed Dose Per Fraction: 2.75 Gy
Plan Total Fractions Prescribed: 20
Plan Total Prescribed Dose: 55 Gy
Reference Point Dosage Given to Date: 46.75 Gy
Reference Point Session Dosage Given: 2.75 Gy
Session Number: 17

## 2022-12-30 ENCOUNTER — Other Ambulatory Visit: Payer: Self-pay

## 2022-12-30 ENCOUNTER — Ambulatory Visit
Admission: RE | Admit: 2022-12-30 | Discharge: 2022-12-30 | Disposition: A | Discharge status: Home / Self Care | Payer: PPO | Source: Ambulatory Visit | Attending: Radiation Oncology | Admitting: Radiation Oncology

## 2022-12-30 DIAGNOSIS — Z191 Hormone sensitive malignancy status: Secondary | ICD-10-CM | POA: Diagnosis not present

## 2022-12-30 DIAGNOSIS — Z510 Encounter for antineoplastic radiation therapy: Secondary | ICD-10-CM | POA: Diagnosis not present

## 2022-12-30 DIAGNOSIS — C61 Malignant neoplasm of prostate: Secondary | ICD-10-CM | POA: Diagnosis not present

## 2022-12-30 LAB — RAD ONC ARIA SESSION SUMMARY
Course Elapsed Days: 23
Plan Fractions Treated to Date: 18
Plan Prescribed Dose Per Fraction: 2.75 Gy
Plan Total Fractions Prescribed: 20
Plan Total Prescribed Dose: 55 Gy
Reference Point Dosage Given to Date: 49.5 Gy
Reference Point Session Dosage Given: 2.75 Gy
Session Number: 18

## 2022-12-31 ENCOUNTER — Other Ambulatory Visit: Payer: Self-pay

## 2022-12-31 ENCOUNTER — Ambulatory Visit
Admission: RE | Admit: 2022-12-31 | Discharge: 2022-12-31 | Disposition: A | Discharge status: Home / Self Care | Payer: PPO | Source: Ambulatory Visit | Attending: Radiation Oncology | Admitting: Radiation Oncology

## 2022-12-31 ENCOUNTER — Ambulatory Visit: Payer: PPO

## 2022-12-31 ENCOUNTER — Ambulatory Visit: Payer: PPO | Admitting: Oncology

## 2022-12-31 ENCOUNTER — Other Ambulatory Visit: Payer: PPO

## 2022-12-31 DIAGNOSIS — Z191 Hormone sensitive malignancy status: Secondary | ICD-10-CM | POA: Diagnosis not present

## 2022-12-31 DIAGNOSIS — C61 Malignant neoplasm of prostate: Secondary | ICD-10-CM | POA: Diagnosis not present

## 2022-12-31 DIAGNOSIS — Z510 Encounter for antineoplastic radiation therapy: Secondary | ICD-10-CM | POA: Diagnosis not present

## 2022-12-31 LAB — RAD ONC ARIA SESSION SUMMARY
Course Elapsed Days: 24
Plan Fractions Treated to Date: 19
Plan Prescribed Dose Per Fraction: 2.75 Gy
Plan Total Fractions Prescribed: 20
Plan Total Prescribed Dose: 55 Gy
Reference Point Dosage Given to Date: 52.25 Gy
Reference Point Session Dosage Given: 2.75 Gy
Session Number: 19

## 2023-01-03 ENCOUNTER — Encounter: Payer: Self-pay | Admitting: Urology

## 2023-01-03 ENCOUNTER — Other Ambulatory Visit: Payer: Self-pay

## 2023-01-03 ENCOUNTER — Ambulatory Visit
Admission: RE | Admit: 2023-01-03 | Discharge: 2023-01-03 | Disposition: A | Discharge status: Home / Self Care | Payer: PPO | Source: Ambulatory Visit | Attending: Radiation Oncology | Admitting: Radiation Oncology

## 2023-01-03 DIAGNOSIS — C61 Malignant neoplasm of prostate: Secondary | ICD-10-CM | POA: Diagnosis not present

## 2023-01-03 DIAGNOSIS — Z510 Encounter for antineoplastic radiation therapy: Secondary | ICD-10-CM | POA: Diagnosis not present

## 2023-01-03 DIAGNOSIS — Z191 Hormone sensitive malignancy status: Secondary | ICD-10-CM | POA: Diagnosis not present

## 2023-01-03 LAB — RAD ONC ARIA SESSION SUMMARY
Course Elapsed Days: 27
Plan Fractions Treated to Date: 20
Plan Prescribed Dose Per Fraction: 2.75 Gy
Plan Total Fractions Prescribed: 20
Plan Total Prescribed Dose: 55 Gy
Reference Point Dosage Given to Date: 55 Gy
Reference Point Session Dosage Given: 2.75 Gy
Session Number: 20

## 2023-02-03 ENCOUNTER — Encounter: Payer: Self-pay | Admitting: Hematology

## 2023-02-03 ENCOUNTER — Other Ambulatory Visit: Payer: Self-pay | Admitting: Urology

## 2023-02-03 DIAGNOSIS — C61 Malignant neoplasm of prostate: Secondary | ICD-10-CM

## 2023-02-03 NOTE — Progress Notes (Signed)
                                                                                                                                                             Patient Name: Shane [name] MRN: [id number] DOB: November 30, 1951 Referring Physician: [name] (Profile Not Attached) Date of Service: 01/03/2023 [facility], [place]                                                        End Of Treatment Note  Diagnoses: C61-Malignant neoplasm of prostate  Cancer Staging: 72 year old male with advanced prostate cancer, T4N1Mb, Gleason 5+4 and PSA of 2.7 at diagnosis in 2020, with excellent treatment response and currently only evidence of disease activity remaining in the prostate.   Intent: Curative  Radiation Treatment Dates: 12/07/2022 through 01/03/2023 Site Technique Total Dose (Gy) Dose per Fx (Gy) Completed Fx Beam Energies  Prostate: Prostate IMRT 55/55 2.75 20/20 6X   Narrative: The patient tolerated radiation therapy relatively well.  He did report modest fatigue which he associated with the ADT as this was present prior to starting treatment.  He also experienced some loose stools/diarrhea and increased nocturia 2-4 times per night.  Plan: The patient will receive a call in about one month from the radiation oncology department. He will continue follow up with Dr. Burr Medico, in medical oncology as well.  ------------------------------------------------   Tyler Pita, MD [place]: [phone]  Fax: [phone] [email or web address]  Skype  [email or web address]

## 2023-02-08 ENCOUNTER — Ambulatory Visit
Admission: RE | Admit: 2023-02-08 | Discharge: 2023-02-08 | Disposition: A | Discharge status: Home / Self Care | Payer: PPO | Source: Ambulatory Visit | Attending: Hematology | Admitting: Hematology

## 2023-02-08 DIAGNOSIS — C61 Malignant neoplasm of prostate: Secondary | ICD-10-CM | POA: Insufficient documentation

## 2023-02-08 NOTE — Progress Notes (Addendum)
  Radiation Oncology         [phone] ________________________________  Name: Shane [name] MRN: [id number]  Date of Service: 02/08/2023  DOB: 1951-08-18  Post Treatment Telephone Note  Diagnosis:  72 year old male with advanced prostate cancer, T4N1Mb, Gleason 5+4 and PSA of 2.7 at diagnosis in 2020, with excellent treatment response and currently only evidence of disease activity remaining in the prostate.   Intent: Curative  Radiation Treatment Dates: 12/07/2022 through 01/03/2023 Site Technique Total Dose (Gy) Dose per Fx (Gy) Completed Fx Beam Energies  Prostate: Prostate IMRT 55/55 2.75 20/20 6X   (as documented in provider EOT note)  Pre Treatment IPSS Score: 4 (as documented in the provider consult note)  The patient was available for call today.   Symptoms of fatigue have improved since completing therapy but still remain moderate .  Symptoms of bladder changes have improved since completing therapy. Current symptoms include, and medications for bladder symptoms include none.  Symptoms of bowel changes have improved since completing therapy. Current symptoms include occasional diarrhea, and medications for bowel symptoms include Imodium, which is working well.   Post Treatment IPSS Score: IPSS Questionnaire (AUA-7): Over the past month.   1)  How often have you had a sensation of not emptying your bladder completely after you finish urinating?  0 - Not at all  2)  How often have you had to urinate again less than two hours after you finished urinating? 4 - More than half the time  3)  How often have you found you stopped and started again several times when you urinated?  0 - Not at all  4) How difficult have you found it to postpone urination?  0 - Not at all  5) How often have you had a weak urinary stream?  1 - Less than 1 time in 5  6) How often have you had to push or strain to begin urination?  0 - Not at all  7) How many times did you most typically get up to  urinate from the time you went to bed until the time you got up in the morning?  4 - 4 times  Total score:  9. Which indicates moderate symptoms  0-7 mildly symptomatic   8-19 moderately symptomatic   20-35 severely symptomatic   Patient has a scheduled follow up visit with his urologist, Dr. [name], on 02/2023 for ongoing surveillance. He was counseled that PSA levels will be drawn in the urology office, and was reassured that additional time is expected to improve bowel and bladder symptoms. He will continue follow up with Dr. Burr Medico, in medical oncology as well. He was encouraged to call back with concerns or questions regarding radiation.  This concludes the interview.   Leandra Kern, LPN

## 2023-02-19 ENCOUNTER — Other Ambulatory Visit: Payer: Self-pay

## 2023-02-19 ENCOUNTER — Other Ambulatory Visit: Payer: Self-pay | Admitting: Hematology

## 2023-02-19 DIAGNOSIS — C61 Malignant neoplasm of prostate: Secondary | ICD-10-CM

## 2023-02-19 MED ORDER — PREDNISONE 5 MG PO TABS
5.0000 mg | ORAL_TABLET | Freq: Every day | ORAL | 0 refills | Status: DC
  Filled 2023-02-19: qty 90, 90d supply, fill #0

## 2023-02-19 MED ORDER — ABIRATERONE ACETATE 250 MG PO TABS
1000.0000 mg | ORAL_TABLET | Freq: Every day | ORAL | 2 refills | Status: DC
  Filled 2023-02-19: qty 120, 30d supply, fill #0

## 2023-02-21 ENCOUNTER — Other Ambulatory Visit: Payer: Self-pay | Admitting: Pharmacist

## 2023-02-21 ENCOUNTER — Other Ambulatory Visit: Payer: Self-pay

## 2023-02-21 ENCOUNTER — Other Ambulatory Visit (HOSPITAL_COMMUNITY): Payer: Self-pay

## 2023-02-21 NOTE — Progress Notes (Signed)
Oral Chemotherapy Pharmacist Encounter   Confirmed with Dr. Burr Medico that patient will not be restarting Zytiga at this time (Zytiga stopped at last MD visit with Dr. Alen Blew on 12/21/22).  Medication list updated and prescription discontinued at the [facility].   Leron Croak, PharmD, BCPS, [facility] Hematology/Oncology Clinical Pharmacist [facility] and [place] [phone] 02/21/2023 9:51 AM

## 2023-02-24 ENCOUNTER — Encounter: Payer: Self-pay | Admitting: Hematology

## 2023-02-27 ENCOUNTER — Encounter: Payer: Self-pay | Admitting: Hematology

## 2023-03-01 DIAGNOSIS — C778 Secondary and unspecified malignant neoplasm of lymph nodes of multiple regions: Secondary | ICD-10-CM | POA: Diagnosis not present

## 2023-03-01 DIAGNOSIS — C7951 Secondary malignant neoplasm of bone: Secondary | ICD-10-CM | POA: Diagnosis not present

## 2023-03-01 DIAGNOSIS — C61 Malignant neoplasm of prostate: Secondary | ICD-10-CM | POA: Diagnosis not present

## 2023-03-03 ENCOUNTER — Encounter: Payer: Self-pay | Admitting: *Deleted

## 2023-03-03 ENCOUNTER — Inpatient Hospital Stay: Payer: PPO | Attending: Hematology | Admitting: *Deleted

## 2023-03-03 DIAGNOSIS — C61 Malignant neoplasm of prostate: Secondary | ICD-10-CM

## 2023-03-03 NOTE — Progress Notes (Signed)
SCP reviewed and completed. 

## 2023-03-17 ENCOUNTER — Encounter: Payer: Self-pay | Admitting: Family Medicine

## 2023-03-17 ENCOUNTER — Encounter: Payer: Self-pay | Admitting: *Deleted

## 2023-03-25 DIAGNOSIS — N411 Chronic prostatitis: Secondary | ICD-10-CM | POA: Diagnosis not present

## 2023-03-25 DIAGNOSIS — N3040 Irradiation cystitis without hematuria: Secondary | ICD-10-CM | POA: Diagnosis not present

## 2023-03-30 ENCOUNTER — Telehealth: Payer: Self-pay | Admitting: Hematology

## 2023-03-30 NOTE — Telephone Encounter (Signed)
Called patient back to confirm voice message left to cancel 5/15 appointments. Appointments cancelled. Patient continuing to receive treatment with another MD.

## 2023-04-07 ENCOUNTER — Telehealth: Payer: Self-pay | Admitting: Family Medicine

## 2023-04-07 NOTE — Telephone Encounter (Signed)
Contacted Shane [name] to schedule their annual wellness visit. Appointment made for 04/12/2023.  [facility] Care Guide [facility] AWV TEAM Direct Dial: [phone]

## 2023-04-12 ENCOUNTER — Ambulatory Visit (INDEPENDENT_AMBULATORY_CARE_PROVIDER_SITE_OTHER): Payer: PPO

## 2023-04-12 VITALS — Ht 71.0 in | Wt 196.0 lb

## 2023-04-12 DIAGNOSIS — Z0000 Encounter for general adult medical examination without abnormal findings: Secondary | ICD-10-CM | POA: Diagnosis not present

## 2023-04-12 NOTE — Progress Notes (Signed)
I connected with  Shane [name] on 04/12/23 by a audio enabled telemedicine application and verified that I am speaking with the correct person using two identifiers.  Patient Location: Home  Provider Location: Home Office  I discussed the limitations of evaluation and management by telemedicine. The patient expressed understanding and agreed to proceed.  Subjective:   Shane [name] is a 72 y.o. male who presents for Medicare Annual/Subsequent preventive examination.  Review of Systems      Cardiac Risk Factors include: advanced age (>64men, >58 women);hypertension;male gender     Objective:    Today's Vitals   04/12/23 1252  Weight: 196 lb (88.9 kg)  Height: 5\' 11"  (1.803 m)   Body mass index is 27.34 kg/m.     04/12/2023    1:01 PM 11/16/2022    8:00 AM 09/28/2022    8:30 AM 05/07/2022    1:14 PM 02/12/2022    4:32 AM 09/24/2020    8:34 AM 02/19/2020    8:54 AM  Advanced Directives  Does Patient Have a Medical Advance Directive? Yes Yes Yes Yes No Yes Yes  Type of Estate agent of [place];Living will Healthcare Power of [place];Living will Healthcare Power of [place];Living will Healthcare Power of [place];Living will  Living will Living will  Does patient want to make changes to medical advance directive?      Yes (MAU/Ambulatory/Procedural Areas - Information given) No - Patient declined  Copy of Healthcare Power of Attorney in Chart? No - copy requested No - copy requested  No - copy requested       Current Medications (verified) Outpatient Encounter Medications as of 04/12/2023  Medication Sig   acetaminophen (TYLENOL) 500 MG tablet Take 500 mg by mouth every 6 (six) hours as needed.   Calcium-Magnesium-Vitamin D (CITRACAL CALCIUM+D) 600-40-500 MG-MG-UNIT TB24 Take 1 tablet by mouth daily.   ELIQUIS 5 MG TABS tablet TAKE ONE TABLET BY MOUTH TWICE A DAY   Leuprolide Acetate, 4 Month, (ELIGARD) 30 MG injection Inject 30 mg into the skin every 4  (four) months. Per pt done at cancer center   predniSONE (DELTASONE) 5 MG tablet Take 1 tablet (5 mg total) by mouth daily with breakfast. (Patient not taking: Reported on 04/12/2023)   No facility-administered encounter medications on file as of 04/12/2023.    Allergies (verified) Effexor xr [venlafaxine hcl er], Hydrocodone, Oxycodone, Bactrim [sulfamethoxazole-trimethoprim], Penicillins, Vancomycin, and Zithromax [azithromycin]   History: Past Medical History:  Diagnosis Date   Anticoagulant long-term use    eliquis--- managed by pcp  for recurrent PEs   Cancer involving bladder by direct extension from prostate (HCC) 06/28/2019   Cystoscopy [facility]) - 2 bladder tumors, 1 on anterior prostate planned TURBT/TURP 06/2019 Biopsy - Gleason 5+4=9 prostate cancer [facility] started 07/2019 planned metastatic survey   Family history of hemochromatosis    History of pulmonary embolus (PE) 06/2019   incidental finding on CT when staging done for prostate cancer dx,  acute PEs RML/ RUL treated w/ blood thinner;   recurrent 06-23-2021  RLL ;  recurrent 02-11-2022  this time is on long term use eliquis  (11-11-2022  he had never had blood clots before 2020 and note since 03/ 2023   History of syncope 2006   previousliy followed by cardiologist- dr Graciela Husbands--  recurrent syncope,  implanted loop recorder 10/ 2006,  explanted 03/ 2017 since no syncope for 10 yrs   (11-11-2022  per pt no issues since 2017)   Hypertension  monitor by pcp and pt monitoring at home---  has improved , pcp tood pt off med   Malignant neoplasm prostate [facility]) 06/2019   urologist--  dr herrick/  radiation oncologist--- dr Kathrynn Running---   dx 07/ 2020,  mets to bladder,  Gleason 5+4   Nocturia    Seasonal allergies    Wears glasses    Past Surgical History:  Procedure Laterality Date   CYSTOSCOPY W/ RETROGRADES Bilateral 07/06/2019   Procedure: CYSTOSCOPY WITH RETROGRADE PYELOGRAM, BILATERAL STENT PLACEMENT;  Surgeon: Crist Fat, MD;  Location: [facility];  Service: Urology;  Laterality: Bilateral;   EP IMPLANTABLE DEVICE N/A 02/25/2016   Procedure: Loop Recorder Removal;  Surgeon: Duke Salvia, MD;  Location: [facility] INVASIVE CV LAB;  Service: Cardiovascular;  Laterality: N/A;    EXPLANTED   GOLD SEED IMPLANT N/A 11/16/2022   Procedure: GOLD SEED IMPLANT INTO PROSTATE;  Surgeon: Bjorn Pippin, MD;  Location: [facility];  Service: Urology;  Laterality: N/A;   IR NEPHROSTOMY PLACEMENT LEFT  02/14/2020   IR NEPHROSTOMY PLACEMENT RIGHT  02/14/2020   LIPOMA EXCISION N/A 02/09/2016   Procedure: EXCISION NECK LIPOMA;  Surgeon: Chevis Pretty III, MD;  Location: [facility];  Service: General;  Laterality: N/A;   LOOP RECORDER INSERTION  10/04/2005   @[facility]  by dr Graciela Husbands   TRANSRECTAL ULTRASOUND N/A 11/16/2022   Procedure: TRANSRECTAL ULTRASOUND;  Surgeon: Bjorn Pippin, MD;  Location: [facility];  Service: Urology;  Laterality: N/A;   TRANSURETHRAL RESECTION OF BLADDER TUMOR N/A 07/06/2019   Procedure: TRANSURETHRAL RESECTION OF BLADDER TUMOR (TURBT);  Surgeon: Crist Fat, MD;  Location: [facility];  Service: Urology;  Laterality: N/A;   Family History  Problem Relation Age of Onset   Hyperlipidemia Mother    Cancer Mother        peritoneal cancer   Hemochromatosis Father        s/p liver transplant - pt screened negative 07/2015   Stroke Neg Hx    Diabetes Neg Hx    CAD Neg Hx    Social History   Socioeconomic History   Marital status: Married    Spouse name: Not on file   Number of children: Not on file   Years of education: Not on file   Highest education level: Not on file  Occupational History   Not on file  Tobacco Use   Smoking status: Never   Smokeless tobacco: Never  Vaping Use   Vaping Use: Never used  Substance and Sexual Activity   Alcohol use: Yes    Alcohol/week: 8.0 standard drinks of alcohol    Types: 7 Cans of beer, 1 Shots of liquor per week     Comment: 11-11-2022  per pt one beer daily and one drink on weekend   Drug use: Never   Sexual activity: Not on file  Other Topics Concern   Not on file  Social History Narrative   Lives with wife   Grown children   Occ: retired Engineer, civil (consulting), Quarry manager   Edu: Bachelor of Art   Activity: stays active outdoors working   Diet: some water, fruits/vegetables daily   Social Determinants of Health   Financial Resource Strain: Low Risk  (04/12/2023)   Overall Financial Resource Strain (CARDIA)    Difficulty of Paying Living Expenses: Not hard at all  Food Insecurity: No Food Insecurity (04/12/2023)   Hunger Vital Sign    Worried About Running Out of Food  in the Last Year: Never true    Ran Out of Food in the Last Year: Never true  Transportation Needs: No Transportation Needs (04/12/2023)   PRAPARE - Administrator, Civil Service (Medical): No    Lack of Transportation (Non-Medical): No  Physical Activity: Sufficiently Active (04/12/2023)   Exercise Vital Sign    Days of Exercise per Week: 3 days    Minutes of Exercise per Session: 60 min  Stress: No Stress Concern Present (04/12/2023)   [employer] of Occupational Health - Occupational Stress Questionnaire    Feeling of Stress : Not at all  Social Connections: Moderately Integrated (04/12/2023)   Social Connection and Isolation Panel [NHANES]    Frequency of Communication with Friends and Family: More than three times a week    Frequency of Social Gatherings with Friends and Family: More than three times a week    Attends Religious Services: More than 4 times per year    Active Member of [employer] or Organizations: No    Attends Engineer, structural: Never    Marital Status: Married    Tobacco Counseling Counseling given: Not Answered   Clinical Intake:  Pre-visit preparation completed: Yes  Pain : No/denies pain     Nutritional Risks: None Diabetes: No  How often do you need to have  someone help you when you read instructions, pamphlets, or other written materials from your doctor or pharmacy?: 1 - Never  Diabetic? no  Interpreter Needed?: No  Information entered by :: C.Medea Deines LPN   Activities of Daily Living    04/12/2023    1:01 PM 11/16/2022    8:02 AM  In your present state of health, do you have any difficulty performing the following activities:  Hearing? 0 0  Vision? 0 0  Difficulty concentrating or making decisions? 0 0  Walking or climbing stairs? 0 0  Dressing or bathing? 0 0  Doing errands, shopping? 0   Preparing Food and eating ? N   Using the Toilet? N   In the past six months, have you accidently leaked urine? N   Do you have problems with loss of bowel control? N   Managing your Medications? N   Managing your Finances? N   Housekeeping or managing your Housekeeping? N     Patient Care Team: Eustaquio Boyden, MD as PCP - General (Family Medicine) Kathyrn Sheriff, [facility] as Pharmacist (Pharmacist) Malachy Mood, MD as Consulting Physician (Hematology and Oncology) Margaretmary Dys, MD as Consulting Physician (Radiation Oncology) Bjorn Pippin, MD as Attending Physician (Urology) Axel Filler Larna Daughters, NP as Nurse Practitioner (Hematology and Oncology) Maryclare Labrador, RN as Registered Nurse  Indicate any recent Medical Services you may have received from other than [facility] providers in the past year (date may be approximate).     Assessment:   This is a routine wellness examination for Shane [name].  Hearing/Vision screen Hearing Screening - Comments:: No aids Vision Screening - Comments:: Glasses - Dr.Bell  Dietary issues and exercise activities discussed: Current Exercise Habits: Structured exercise class, Type of exercise: strength training/weights (aerobics), Time (Minutes): 60, Frequency (Times/Week): 7, Weekly Exercise (Minutes/Week): 420, Intensity: Moderate, Exercise limited by: None identified   Goals Addressed              This Visit's Progress    Patient Stated       No new goals       Depression Screen    04/12/2023    1:00 PM  05/07/2022    1:14 PM 05/07/2022    1:13 PM 05/06/2021    9:56 AM 04/24/2020   10:29 AM 04/08/2020    9:54 AM 01/01/2020    2:04 PM  PHQ 2/9 Scores  PHQ - 2 Score 0 0 0 0 0 0 0  PHQ- 9 Score     0      Fall Risk    04/12/2023   12:56 PM 05/07/2022    1:14 PM 05/06/2021    9:56 AM 04/24/2020   10:29 AM 04/08/2020    9:54 AM  Fall Risk   Falls in the past year? 0 0 0 0 0  Number falls in past yr: 0 0  0   Injury with Fall? 0 0  0   Risk for fall due to : No Fall Risks   Medication side effect   Follow up Falls prevention discussed;Falls evaluation completed Falls evaluation completed  Falls evaluation completed;Falls prevention discussed Falls evaluation completed    FALL RISK PREVENTION PERTAINING TO THE HOME:  Any stairs in or around the home? Yes  If so, are there any without handrails? No  Home free of loose throw rugs in walkways, pet beds, electrical cords, etc? Yes  Adequate lighting in your home to reduce risk of falls? Yes   ASSISTIVE DEVICES UTILIZED TO PREVENT FALLS:  Life alert? No  Use of a cane, walker or w/c? No  Grab bars in the bathroom? No  Shower chair or bench in shower? No  Elevated toilet seat or a handicapped toilet? No    Cognitive Function:    04/24/2020   10:31 AM  MMSE - Mini Mental State Exam  Orientation to time 5  Orientation to Place 5  Registration 3  Attention/ Calculation 5  Recall 3  Language- repeat 1        04/12/2023    1:02 PM  6CIT Screen  What Year? 0 points  What month? 0 points  What time? 0 points  Count back from 20 0 points  Months in reverse 0 points  Repeat phrase 2 points  Total Score 2 points    Immunizations Immunization History  Administered Date(s) Administered   Influenza, High Dose Seasonal PF 10/02/2021   Influenza-Unspecified 10/09/2019   PFIZER(Purple Top)SARS-COV-2 Vaccination 12/28/2019,  01/17/2020, 09/25/2020, 04/20/2021   Pfizer Covid-19 Vaccine Bivalent Booster 39yrs & up 10/02/2021   Pneumococcal Conjugate-13 08/03/2016   Pneumococcal Polysaccharide-23 08/05/2017   Tdap 02/02/2017   Zoster Recombinat (Shingrix) 10/09/2019, 03/25/2020    TDAP status: Up to date  Flu Vaccine status: Up to date  Pneumococcal vaccine status: Up to date  Covid-19 vaccine status: Completed vaccines  Qualifies for Shingles Vaccine? Yes   Zostavax completed  unknown   Shingrix Completed?: Yes  Screening Tests Health Maintenance  Topic Date Due   COVID-19 Vaccine (6 - 2023-24 season) 08/06/2022   Fecal DNA (Cologuard)  05/09/2023   COLON CANCER SCREENING ANNUAL FOBT  05/14/2023   INFLUENZA VACCINE  07/07/2023   Medicare Annual Wellness (AWV)  04/11/2024   DTaP/Tdap/Td (2 - Td or Tdap) 02/02/2027   Pneumonia Vaccine 4+ Years old  Completed   Hepatitis C Screening  Completed   Zoster Vaccines- Shingrix  Completed   HPV VACCINES  Aged Out    Health Maintenance  Health Maintenance Due  Topic Date Due   COVID-19 Vaccine (6 - 2023-24 season) 08/06/2022    Colorectal cancer screening: Type of screening: FOBT/FIT. Completed normal. Repeat every 1  years  Lung Cancer Screening: (Low Dose CT Chest recommended if Age 90-80 years, 30 pack-year currently smoking OR have quit w/in 15years.) does not qualify.   Lung Cancer Screening Referral: no  Additional Screening:  Hepatitis C Screening: does qualify; Completed 07/30/16  Vision Screening: Recommended annual ophthalmology exams for early detection of glaucoma and other disorders of the eye. Is the patient up to date with their annual eye exam?  Yes  Who is the provider or what is the name of the office in which the patient attends annual eye exams? Dr.Bell If pt is not established with a provider, would they like to be referred to a provider to establish care? Yes .   Dental Screening: Recommended annual dental exams for  proper oral hygiene  Community Resource Referral / Chronic Care Management: CRR required this visit?  No   CCM required this visit?  No      Plan:     I have personally reviewed and noted the following in the patient's chart:   Medical and social history Use of alcohol, tobacco or illicit drugs  Current medications and supplements including opioid prescriptions. Patient is not currently taking opioid prescriptions. Functional ability and status Nutritional status Physical activity Advanced directives List of other physicians Hospitalizations, surgeries, and ER visits in previous 12 months Vitals Screenings to include cognitive, depression, and falls Referrals and appointments  In addition, I have reviewed and discussed with patient certain preventive protocols, quality metrics, and best practice recommendations. A written personalized care plan for preventive services as well as general preventive health recommendations were provided to patient.     Maryan Puls, LPN   03/12/8294   Nurse Notes: none

## 2023-04-12 NOTE — Patient Instructions (Signed)
Shane [name] , Thank you for taking time to come for your Medicare Wellness Visit. I appreciate your ongoing commitment to your health goals. Please review the following plan we discussed and let me know if I can assist you in the future.   These are the goals we discussed:  Goals      Manage My Medicine     Timeframe:  Long-Range Goal Priority:  High Start Date:      11/19/21                  Expected End Date:    11/19/22               Follow Up Date June 2023   - call for medicine refill 2 or 3 days before it runs out - call if I am sick and can't take my medicine - keep a list of all the medicines I take; vitamins and herbals too - use a pillbox to sort medicine    Why is this important?   These steps will help you keep on track with your medicines.   Notes:      Patient Stated     04/24/2020, I will maintain and continue medications as prescribed.      Patient Stated     No new goals        This is a list of the screening recommended for you and due dates:  Health Maintenance  Topic Date Due   COVID-19 Vaccine (6 - 2023-24 season) 08/06/2022   Cologuard (Stool DNA test)  05/09/2023   Stool Blood Test  05/14/2023   Flu Shot  07/07/2023   Medicare Annual Wellness Visit  04/11/2024   DTaP/Tdap/Td vaccine (2 - Td or Tdap) 02/02/2027   Pneumonia Vaccine  Completed   Hepatitis C Screening: USPSTF Recommendation to screen - Ages 73-79 yo.  Completed   Zoster (Shingles) Vaccine  Completed   HPV Vaccine  Aged Out    Advanced directives: Please bring a copy of your health care power of attorney and living will to the office to be added to your chart at your convenience.   Conditions/risks identified: none  Next appointment: Follow up in one year for your annual wellness visit. 04/12/24 @ 10:15 telephone  Preventive Care 65 Years and Older, Male  Preventive care refers to lifestyle choices and visits with your health care provider that can promote health and  wellness. What does preventive care include? A yearly physical exam. This is also called an annual well check. Dental exams once or twice a year. Routine eye exams. Ask your health care provider how often you should have your eyes checked. Personal lifestyle choices, including: Daily care of your teeth and gums. Regular physical activity. Eating a healthy diet. Avoiding tobacco and drug use. Limiting alcohol use. Practicing safe sex. Taking low doses of aspirin every day. Taking vitamin and mineral supplements as recommended by your health care provider. What happens during an annual well check? The services and screenings done by your health care provider during your annual well check will depend on your age, overall health, lifestyle risk factors, and family history of disease. Counseling  Your health care provider may ask you questions about your: Alcohol use. Tobacco use. Drug use. Emotional well-being. Home and relationship well-being. Sexual activity. Eating habits. History of falls. Memory and ability to understand (cognition). Work and work Astronomer. Screening  You may have the following tests or measurements: Height, weight, and BMI. Blood  pressure. Lipid and cholesterol levels. These may be checked every 5 years, or more frequently if you are over 73 years old. Skin check. Lung cancer screening. You may have this screening every year starting at age 73 if you have a 30-pack-year history of smoking and currently smoke or have quit within the past 15 years. Fecal occult blood test (FOBT) of the stool. You may have this test every year starting at age 73. Flexible sigmoidoscopy or colonoscopy. You may have a sigmoidoscopy every 5 years or a colonoscopy every 10 years starting at age 40. Prostate cancer screening. Recommendations will vary depending on your family history and other risks. Hepatitis C blood test. Hepatitis B blood test. Sexually transmitted disease  (STD) testing. Diabetes screening. This is done by checking your blood sugar (glucose) after you have not eaten for a while (fasting). You may have this done every 1-3 years. Abdominal aortic aneurysm (AAA) screening. You may need this if you are a current or former smoker. Osteoporosis. You may be screened starting at age 43 if you are at high risk. Talk with your health care provider about your test results, treatment options, and if necessary, the need for more tests. Vaccines  Your health care provider may recommend certain vaccines, such as: Influenza vaccine. This is recommended every year. Tetanus, diphtheria, and acellular pertussis (Tdap, Td) vaccine. You may need a Td booster every 10 years. Zoster vaccine. You may need this after age 73. Pneumococcal 13-valent conjugate (PCV13) vaccine. One dose is recommended after age 19. Pneumococcal polysaccharide (PPSV23) vaccine. One dose is recommended after age 40. Talk to your health care provider about which screenings and vaccines you need and how often you need them. This information is not intended to replace advice given to you by your health care provider. Make sure you discuss any questions you have with your health care provider. Document Released: 12/19/2015 Document Revised: 08/11/2016 Document Reviewed: 09/23/2015 Elsevier Interactive Patient Education  2017 [employer].  Fall Prevention in the Home Falls can cause injuries. They can happen to people of all ages. There are many things you can do to make your home safe and to help prevent falls. What can I do on the outside of my home? Regularly fix the edges of walkways and driveways and fix any cracks. Remove anything that might make you trip as you walk through a door, such as a raised step or threshold. Trim any bushes or trees on the path to your home. Use bright outdoor lighting. Clear any walking paths of anything that might make someone trip, such as rocks or  tools. Regularly check to see if handrails are loose or broken. Make sure that both sides of any steps have handrails. Any raised decks and porches should have guardrails on the edges. Have any leaves, snow, or ice cleared regularly. Use sand or salt on walking paths during winter. Clean up any spills in your garage right away. This includes oil or grease spills. What can I do in the bathroom? Use night lights. Install grab bars by the toilet and in the tub and shower. Do not use towel bars as grab bars. Use non-skid mats or decals in the tub or shower. If you need to sit down in the shower, use a plastic, non-slip stool. Keep the floor dry. Clean up any water that spills on the floor as soon as it happens. Remove soap buildup in the tub or shower regularly. Attach bath mats securely with double-sided non-slip rug  tape. Do not have throw rugs and other things on the floor that can make you trip. What can I do in the bedroom? Use night lights. Make sure that you have a light by your bed that is easy to reach. Do not use any sheets or blankets that are too big for your bed. They should not hang down onto the floor. Have a firm chair that has side arms. You can use this for support while you get dressed. Do not have throw rugs and other things on the floor that can make you trip. What can I do in the kitchen? Clean up any spills right away. Avoid walking on wet floors. Keep items that you use a lot in easy-to-reach places. If you need to reach something above you, use a strong step stool that has a grab bar. Keep electrical cords out of the way. Do not use floor polish or wax that makes floors slippery. If you must use wax, use non-skid floor wax. Do not have throw rugs and other things on the floor that can make you trip. What can I do with my stairs? Do not leave any items on the stairs. Make sure that there are handrails on both sides of the stairs and use them. Fix handrails that are  broken or loose. Make sure that handrails are as long as the stairways. Check any carpeting to make sure that it is firmly attached to the stairs. Fix any carpet that is loose or worn. Avoid having throw rugs at the top or bottom of the stairs. If you do have throw rugs, attach them to the floor with carpet tape. Make sure that you have a light switch at the top of the stairs and the bottom of the stairs. If you do not have them, ask someone to add them for you. What else can I do to help prevent falls? Wear shoes that: Do not have high heels. Have rubber bottoms. Are comfortable and fit you well. Are closed at the toe. Do not wear sandals. If you use a stepladder: Make sure that it is fully opened. Do not climb a closed stepladder. Make sure that both sides of the stepladder are locked into place. Ask someone to hold it for you, if possible. Clearly mark and make sure that you can see: Any grab bars or handrails. First and last steps. Where the edge of each step is. Use tools that help you move around (mobility aids) if they are needed. These include: Canes. Walkers. Scooters. Crutches. Turn on the lights when you go into a dark area. Replace any light bulbs as soon as they burn out. Set up your furniture so you have a clear path. Avoid moving your furniture around. If any of your floors are uneven, fix them. If there are any pets around you, be aware of where they are. Review your medicines with your doctor. Some medicines can make you feel dizzy. This can increase your chance of falling. Ask your doctor what other things that you can do to help prevent falls. This information is not intended to replace advice given to you by your health care provider. Make sure you discuss any questions you have with your health care provider. Document Released: 09/18/2009 Document Revised: 04/29/2016 Document Reviewed: 12/27/2014 Elsevier Interactive Patient Education  2017 [employer].

## 2023-04-15 DIAGNOSIS — C61 Malignant neoplasm of prostate: Secondary | ICD-10-CM | POA: Diagnosis not present

## 2023-04-15 DIAGNOSIS — Z5111 Encounter for antineoplastic chemotherapy: Secondary | ICD-10-CM | POA: Diagnosis not present

## 2023-04-20 ENCOUNTER — Ambulatory Visit: Payer: PPO | Admitting: Hematology

## 2023-04-20 ENCOUNTER — Other Ambulatory Visit: Payer: PPO

## 2023-04-20 ENCOUNTER — Ambulatory Visit: Payer: PPO

## 2023-05-08 ENCOUNTER — Other Ambulatory Visit: Payer: Self-pay | Admitting: Family Medicine

## 2023-05-08 DIAGNOSIS — E7800 Pure hypercholesterolemia, unspecified: Secondary | ICD-10-CM

## 2023-05-08 DIAGNOSIS — Z8349 Family history of other endocrine, nutritional and metabolic diseases: Secondary | ICD-10-CM

## 2023-05-08 DIAGNOSIS — C61 Malignant neoplasm of prostate: Secondary | ICD-10-CM

## 2023-05-10 ENCOUNTER — Other Ambulatory Visit: Payer: PPO

## 2023-05-17 ENCOUNTER — Encounter: Payer: PPO | Admitting: Family Medicine

## 2023-05-17 ENCOUNTER — Ambulatory Visit: Payer: PPO | Admitting: Family Medicine

## 2023-05-20 ENCOUNTER — Encounter: Payer: Self-pay | Admitting: Family Medicine

## 2023-05-20 ENCOUNTER — Ambulatory Visit (INDEPENDENT_AMBULATORY_CARE_PROVIDER_SITE_OTHER): Payer: PPO | Admitting: Family Medicine

## 2023-05-20 VITALS — BP 152/72 | HR 69 | Temp 97.2°F | Ht 71.0 in | Wt 198.4 lb

## 2023-05-20 DIAGNOSIS — I10 Essential (primary) hypertension: Secondary | ICD-10-CM

## 2023-05-20 DIAGNOSIS — G4700 Insomnia, unspecified: Secondary | ICD-10-CM | POA: Insufficient documentation

## 2023-05-20 DIAGNOSIS — K429 Umbilical hernia without obstruction or gangrene: Secondary | ICD-10-CM

## 2023-05-20 DIAGNOSIS — Z8349 Family history of other endocrine, nutritional and metabolic diseases: Secondary | ICD-10-CM

## 2023-05-20 DIAGNOSIS — F5104 Psychophysiologic insomnia: Secondary | ICD-10-CM

## 2023-05-20 DIAGNOSIS — Z8719 Personal history of other diseases of the digestive system: Secondary | ICD-10-CM | POA: Insufficient documentation

## 2023-05-20 NOTE — Patient Instructions (Signed)
We will refer you to surgeon to discuss pros/cons of surgical repair for umbilical hernia.  In interim, avoid heavy lifting.   Bedtime routine checklist: 1. Avoid naps during the day 2. Avoid stimulants such as caffeine and nicotine. Avoid bedtime alcohol (it can speed onset of sleep but the body's metabolism can cause awakenings). 3. All forms of exercise help ensure sound sleep - limit vigorous exercise to morning or late afternoon 4. Avoid food too close to bedtime including chocolate (which contains caffeine) 5. Soak up natural light 6. Establish regular bedtime routine. 7. Associate bed with sleep - avoid TV, computer or phone, reading while in bed. 8. Ensure pleasant, relaxing sleep environment - quiet, dark, cool room.

## 2023-05-20 NOTE — Assessment & Plan Note (Signed)
Declines DNA screen. Will discuss at upcoming cancer geneticist appt to discuss screenings.

## 2023-05-20 NOTE — Progress Notes (Signed)
Ph: [phone] Fax: [phone]   Patient ID: Shane [name], male    DOB: [birth year], 72 y.o.   MRN: [id number]  This visit was conducted in person.  BP (!) 152/72   Pulse 69   Temp (!) 97.2 F (36.2 C) (Temporal)   Ht 5\' 11"  (1.803 m)   Wt 198 lb 6 oz (90 kg)   SpO2 99%   BMI 27.67 kg/m    CC: check knot  Subjective:   HPI: Shane [name] is a 72 y.o. male presenting on 05/20/2023 for Mass (C/o knot next to navel. Noticed mos ago. Area is sore and feels when lifting certain objects. )   Several months of knot at umbilicus, more noticeable with activity specifically heavy lifting. Throbbing pain when uncomfortable.   Prostate cancer - continues Eligard. Discussing coming off 05/2024. Now only seeing urology Dr Shane [name].  No longer on seroquel.   Fmhx hemochromatosis - father, paternal uncle, son. Pt has had normal iron studies and liver function throughout the years. Declines DNA testing at this tim.   Upcoming genetic screening next month - will check on hemochromatosis testing.      Relevant past medical, surgical, family and social history reviewed and updated as indicated. Interim medical history since our last visit reviewed. Allergies and medications reviewed and updated. Outpatient Medications Prior to Visit  Medication Sig Dispense Refill   Calcium-Magnesium-Vitamin D (CITRACAL CALCIUM+D) 600-40-500 MG-MG-UNIT TB24 Take 1 tablet by mouth daily.     ELIQUIS 5 MG TABS tablet TAKE ONE TABLET BY MOUTH TWICE A DAY 60 tablet 6   Leuprolide Acetate, 4 Month, (ELIGARD) 30 MG injection Inject 30 mg into the skin every 4 (four) months. Per pt done at cancer center     acetaminophen (TYLENOL) 500 MG tablet Take 500 mg by mouth every 6 (six) hours as needed.     predniSONE (DELTASONE) 5 MG tablet Take 1 tablet (5 mg total) by mouth daily with breakfast. (Patient not taking: Reported on 04/12/2023) 90 tablet 0   No facility-administered medications prior to visit.     Per  HPI unless specifically indicated in ROS section below Review of Systems  Objective:  BP (!) 152/72   Pulse 69   Temp (!) 97.2 F (36.2 C) (Temporal)   Ht 5\' 11"  (1.803 m)   Wt 198 lb 6 oz (90 kg)   SpO2 99%   BMI 27.67 kg/m   Wt Readings from Last 3 Encounters:  05/20/23 198 lb 6 oz (90 kg)  04/12/23 196 lb (88.9 kg)  12/21/22 208 lb 1.6 oz (94.4 kg)      Physical Exam Vitals and nursing note reviewed.  Constitutional:      Appearance: Normal appearance. He is not ill-appearing.  Abdominal:     General: Bowel sounds are normal. There is no distension.     Palpations: Abdomen is soft. There is no mass.     Tenderness: There is no abdominal tenderness. There is no guarding or rebound. Negative signs include Murphy's sign.     Hernia: A hernia is present. Hernia is present in the umbilical area (~4-5cm).  Neurological:     Mental Status: He is alert.  Psychiatric:        Mood and Affect: Mood normal.        Behavior: Behavior normal.         Assessment & Plan:   Problem List Items Addressed This Visit     Family history of hemochromatosis  Declines DNA screen. Will discuss at upcoming cancer geneticist appt to discuss screenings.       Hypertension    BP elevation noted - will reassess at upcoming CPE      Umbilical hernia without obstruction and without gangrene - Primary    Umbilical hernia present. Discussed risks of strangulation.  Refer to gen surg for evaluation.       Relevant Orders   Ambulatory referral to General Surgery   Insomnia    Rec regular bedtime routine, sleep hygiene checklist provided.         No orders of the defined types were placed in this encounter.   Orders Placed This Encounter  Procedures   Ambulatory referral to General Surgery    Referral Priority:   Routine    Referral Type:   Surgical    Referral Reason:   Specialty Services Required    Requested Specialty:   General Surgery    Number of Visits Requested:   1     Patient Instructions  We will refer you to surgeon to discuss pros/cons of surgical repair for umbilical hernia.  In interim, avoid heavy lifting.   Bedtime routine checklist: 1. Avoid naps during the day 2. Avoid stimulants such as caffeine and nicotine. Avoid bedtime alcohol (it can speed onset of sleep but the body's metabolism can cause awakenings). 3. All forms of exercise help ensure sound sleep - limit vigorous exercise to morning or late afternoon 4. Avoid food too close to bedtime including chocolate (which contains caffeine) 5. Soak up natural light 6. Establish regular bedtime routine. 7. Associate bed with sleep - avoid TV, computer or phone, reading while in bed. 8. Ensure pleasant, relaxing sleep environment - quiet, dark, cool room.   Follow up plan: Return if symptoms worsen or fail to improve.  Eustaquio Boyden, MD

## 2023-05-20 NOTE — Assessment & Plan Note (Signed)
Rec regular bedtime routine, sleep hygiene checklist provided.

## 2023-05-20 NOTE — Assessment & Plan Note (Signed)
Umbilical hernia present. Discussed risks of strangulation.  Refer to gen surg for evaluation.

## 2023-05-20 NOTE — Assessment & Plan Note (Signed)
BP elevation noted - will reassess at upcoming CPE

## 2023-05-25 ENCOUNTER — Encounter: Payer: Self-pay | Admitting: Hematology

## 2023-05-25 NOTE — Progress Notes (Unsigned)
Patient ID: Shane [name], male   DOB: Jan 21, 1951, 72 y.o.   MRN: [id number]  Chief Complaint: Umbilical hernia  History of Present Illness Shane [name] is a 72 y.o. male with with umbilical hernia, small bulge noted about a year ago.  Slight increase in size of the bulge.  Some pain/tenderness with heavy lifting.  He can push the bulge back in with 1 finger and have it come back out within the next degree of effort.  No history of nausea or vomiting or bowel habit changes.  Is currently engaged in harvesting his garden, anticipates some heavy lifting over the next month, feels that he could lay low and better convalesce in August.  He takes a blood thinner, Eliquis for history of recurrent pulmonary emboli.  Past Medical History Past Medical History:  Diagnosis Date   Anticoagulant long-term use    eliquis--- managed by pcp  for recurrent PEs   Cancer involving bladder by direct extension from prostate (HCC) 06/28/2019   Cystoscopy [facility]) - 2 bladder tumors, 1 on anterior prostate planned TURBT/TURP 06/2019 Biopsy - Gleason 5+4=9 prostate cancer [facility] started 07/2019 planned metastatic survey   Family history of hemochromatosis    History of pulmonary embolus (PE) 06/2019   incidental finding on CT when staging done for prostate cancer dx,  acute PEs RML/ RUL treated w/ blood thinner;   recurrent 06-23-2021  RLL ;  recurrent 02-11-2022  this time is on long term use eliquis  (11-11-2022  he had never had blood clots before 2020 and note since 03/ 2023   History of syncope 2006   previousliy followed by cardiologist- dr Graciela Husbands--  recurrent syncope,  implanted loop recorder 10/ 2006,  explanted 03/ 2017 since no syncope for 10 yrs   (11-11-2022  per pt no issues since 2017)   Hypertension    monitor by pcp and pt monitoring at home---  has improved , pcp tood pt off med   Malignant neoplasm prostate [facility]) 06/2019   urologist--  dr herrick/  radiation oncologist--- dr Kathrynn Running---   dx 07/  2020,  mets to bladder,  Gleason 5+4   Nocturia    Seasonal allergies    Wears glasses       Past Surgical History:  Procedure Laterality Date   CYSTOSCOPY W/ RETROGRADES Bilateral 07/06/2019   Procedure: CYSTOSCOPY WITH RETROGRADE PYELOGRAM, BILATERAL STENT PLACEMENT;  Surgeon: Crist Fat, MD;  Location: [facility];  Service: Urology;  Laterality: Bilateral;   EP IMPLANTABLE DEVICE N/A 02/25/2016   Procedure: Loop Recorder Removal;  Surgeon: Duke Salvia, MD;  Location: [facility] INVASIVE CV LAB;  Service: Cardiovascular;  Laterality: N/A;    EXPLANTED   GOLD SEED IMPLANT N/A 11/16/2022   Procedure: GOLD SEED IMPLANT INTO PROSTATE;  Surgeon: Bjorn Pippin, MD;  Location: [facility];  Service: Urology;  Laterality: N/A;   IR NEPHROSTOMY PLACEMENT LEFT  02/14/2020   IR NEPHROSTOMY PLACEMENT RIGHT  02/14/2020   LIPOMA EXCISION N/A 02/09/2016   Procedure: EXCISION NECK LIPOMA;  Surgeon: Chevis Pretty III, MD;  Location: [facility];  Service: General;  Laterality: N/A;   LOOP RECORDER INSERTION  10/04/2005   @[facility]  by dr Graciela Husbands   TRANSRECTAL ULTRASOUND N/A 11/16/2022   Procedure: TRANSRECTAL ULTRASOUND;  Surgeon: Bjorn Pippin, MD;  Location: [facility];  Service: Urology;  Laterality: N/A;   TRANSURETHRAL RESECTION OF BLADDER TUMOR N/A 07/06/2019   Procedure: TRANSURETHRAL RESECTION OF BLADDER TUMOR (TURBT);  Surgeon: Berniece Salines  W, MD;  Location: [facility];  Service: Urology;  Laterality: N/A;    Allergies  Allergen Reactions   Effexor Xr [Venlafaxine Hcl Er] Other (See Comments)    Psychiatric symptoms   Hydrocodone Itching and Other (See Comments)    Per pt turns red from chest up, looks like sunburn and starts itching badly   Oxycodone Itching and Other (See Comments)    Per pt turns red from chest up, looks like sunburn and starts itching badly   Bactrim [Sulfamethoxazole-Trimethoprim] Hives and Rash   Penicillins Swelling and Rash    Did  it involve swelling of the face/tongue/throat, SOB, or low BP? No Did it involve sudden or severe rash/hives, skin peeling, or any reaction on the inside of your mouth or nose? No Did you need to seek medical attention at a hospital or doctor's office? No When did it last happen?      50 years ago If all above answers are "NO", may proceed with cephalosporin use.    Vancomycin Hives and Rash   Zithromax [Azithromycin] Rash    Current Outpatient Medications  Medication Sig Dispense Refill   Calcium-Magnesium-Vitamin D (CITRACAL CALCIUM+D) 600-40-500 MG-MG-UNIT TB24 Take 1 tablet by mouth daily.     ELIQUIS 5 MG TABS tablet TAKE ONE TABLET BY MOUTH TWICE A DAY 60 tablet 6   Leuprolide Acetate, 4 Month, (ELIGARD) 30 MG injection Inject 30 mg into the skin every 4 (four) months. Per pt done at cancer center     No current facility-administered medications for this visit.    Family History Family History  Problem Relation Age of Onset   Hyperlipidemia Mother    Cancer Mother        peritoneal cancer   Hemochromatosis Father        s/p liver transplant - pt screened negative 07/2015   Stroke Neg Hx    Diabetes Neg Hx    CAD Neg Hx       Social History Social History   Tobacco Use   Smoking status: Never    Passive exposure: Never   Smokeless tobacco: Never  Vaping Use   Vaping Use: Never used  Substance Use Topics   Alcohol use: Yes    Alcohol/week: 8.0 standard drinks of alcohol    Types: 7 Cans of beer, 1 Shots of liquor per week    Comment: 11-11-2022  per pt one beer daily and one drink on weekend   Drug use: Never        Review of Systems  Constitutional: Negative.   HENT: Negative.    Eyes: Negative.   Respiratory: Negative.    Cardiovascular: Negative.   Gastrointestinal: Negative.   Genitourinary: Negative.   Skin: Negative.   Neurological: Negative.   Psychiatric/Behavioral: Negative.       Physical Exam Blood pressure (!) 146/75, pulse 72,  temperature 98.3 F (36.8 C), height 5\' 11"  (1.803 m), weight 195 lb (88.5 kg), SpO2 98 %. Last Weight  Most recent update: 05/26/2023  9:19 AM    Weight  88.5 kg (195 lb)             CONSTITUTIONAL: Well developed, and nourished, appropriately responsive and aware without distress.   EYES: Sclera non-icteric.   EARS, NOSE, MOUTH AND THROAT:  The oropharynx is clear. Oral mucosa is pink and moist.   Hearing is intact to voice.  NECK: Trachea is midline, and there is no jugular venous distension.  LYMPH NODES:  Lymph nodes  in the neck are not appreciated. RESPIRATORY:  Lungs are clear, and breath sounds are equal bilaterally.  Normal respiratory effort without pathologic use of accessory muscles. CARDIOVASCULAR: Heart is regular in rate and rhythm.   Well perfused.  GI: The abdomen is a 1.5, not more than 2 cm umbilical fascial defect with preperitoneal fat protruding through it.  Soft, nontender, and nondistended. There were no palpable masses.  I did not appreciate hepatosplenomegaly.  MUSCULOSKELETAL:  Symmetrical muscle tone appreciated in all four extremities.    SKIN: Skin turgor is normal. No pathologic skin lesions appreciated.  NEUROLOGIC:  Motor and sensation appear grossly normal.  Cranial nerves are grossly without defect. PSYCH:  Alert and oriented to person, place and time. Affect is appropriate for situation.  Data Reviewed I have personally reviewed what is currently available of the patient's imaging, recent labs and medical records.   Labs:     Latest Ref Rng & Units 12/21/2022    8:52 AM 11/16/2022    7:55 AM 08/31/2022    8:15 AM  CBC  WBC 4.0 - 10.5 K/uL 4.2   4.8   Hemoglobin 13.0 - 17.0 g/dL 16.1  09.6  04.5   Hematocrit 39.0 - 52.0 % 37.4  38.0  37.5   Platelets 150 - 400 K/uL 148   178       Latest Ref Rng & Units 12/21/2022    8:52 AM 11/16/2022    7:55 AM 08/31/2022    8:15 AM  CMP  Glucose 70 - 99 mg/dL 88  409  811   BUN 8 - 23 mg/dL 17  17  21     Creatinine 0.61 - 1.24 mg/dL 9.14  7.82  9.56   Sodium 135 - 145 mmol/L 141  141  141   Potassium 3.5 - 5.1 mmol/L 3.7  3.9  3.5   Chloride 98 - 111 mmol/L 106  104  107   CO2 22 - 32 mmol/L 29   28   Calcium 8.9 - 10.3 mg/dL 9.9   9.4   Total Protein 6.5 - 8.1 g/dL 6.7   6.7   Total Bilirubin 0.3 - 1.2 mg/dL 0.7   0.8   Alkaline Phos 38 - 126 U/L 42   45   AST 15 - 41 U/L 24   30   ALT 0 - 44 U/L 21   30       Imaging: Radiological images reviewed:   Within last 24 hrs: No results found.  Assessment     Patient Active Problem List   Diagnosis Date Noted   Umbilical hernia without obstruction and without gangrene 05/20/2023   Insomnia 05/20/2023   Hand injury, right, initial encounter 05/15/2022   Substance or medication-induced bipolar and related disorder (HCC) 02/16/2022   COVID-19 virus infection 10/27/2021   Hypertension 10/27/2021   Loss of peripheral visual field, bilateral 07/16/2021   Vision loss of right eye 06/30/2021   History of pulmonary embolism 06/24/2021   Atherosclerosis of aorta (HCC) 05/06/2021   CAD (coronary artery disease) 05/06/2021   History of DVT of lower extremity 02/25/2020   Hydronephrosis 02/13/2020   Recurrent UTI 12/31/2019   Malignant neoplasm of prostate (HCC) 08/17/2019   HLD (hyperlipidemia) 08/02/2018   Health maintenance examination 08/05/2017   Anosmia 08/05/2017   Family history of hemochromatosis 08/03/2016   Medicare annual wellness visit, subsequent 07/29/2015   Advanced care planning/counseling discussion 07/29/2015   Bradycardia 07/29/2015   History of cardiac monitoring 07/29/2015  Syncopal episodes     Plan    We discussed various approaches to umbilical hernia repair.  I believe he will do best with just a direct primary suture repair.  And avoid utilization/potential complications of mesh.  His hernia is small and I expect the recurrence risk is quite acceptable. I discussed possibility of incarceration,  strangulation, enlargement in size over time, and the need for emergency surgery in the face of these.  Also reviewed the techniques of reduction should incarceration occur, and when unsuccessful to present to the ED.  Also discussed that surgery risks include recurrence which can be up to 30% in the case of complex hernias, use of prosthetic materials (mesh) and the increased risk of infection and the possible need for re-operation and removal of mesh, possibility of post-op SBO or ileus, and the risks of general anesthetic including heart attack, stroke, sudden death or some reaction to anesthetic medications. The patient, and those present, appear to understand the risks, any and all questions were answered to the patient's satisfaction.  No guarantees were ever expressed or implied.   Face-to-face time spent with the patient and accompanying care providers(if present) was 30 minutes, with more than 50% of the time spent counseling, educating, and coordinating care of the patient.    These notes generated with voice recognition software. I apologize for typographical errors.  Campbell Lerner M.D., FACS 05/26/2023, 9:21 AM

## 2023-05-26 ENCOUNTER — Encounter: Payer: Self-pay | Admitting: Surgery

## 2023-05-26 ENCOUNTER — Ambulatory Visit (INDEPENDENT_AMBULATORY_CARE_PROVIDER_SITE_OTHER): Payer: PPO | Admitting: Surgery

## 2023-05-26 VITALS — BP 146/75 | HR 72 | Temp 98.3°F | Ht 71.0 in | Wt 195.0 lb

## 2023-05-26 DIAGNOSIS — Z7901 Long term (current) use of anticoagulants: Secondary | ICD-10-CM | POA: Insufficient documentation

## 2023-05-26 DIAGNOSIS — Z86711 Personal history of pulmonary embolism: Secondary | ICD-10-CM

## 2023-05-26 DIAGNOSIS — K429 Umbilical hernia without obstruction or gangrene: Secondary | ICD-10-CM | POA: Diagnosis not present

## 2023-05-26 NOTE — Patient Instructions (Signed)
We will have you follow up here at the end of July to schedule for surgery in August.   You have requested for your Umbilical Hernia be repaired. This will be scheduled with Dr. Claudine Mouton at [facility].   You will need to arrange to be off work for 1-2 weeks but will have to have a lifting restriction of no more than 15 lbs for 6 weeks following your surgery. If you have FMLA or disability paperwork that needs filled out you may drop this off at our office or this can be faxed to [phone].     Umbilical Hernia, Adult A hernia is a bulge of tissue that pushes through an opening between muscles. An umbilical hernia happens in the abdomen, near the belly button (umbilicus). The hernia may contain tissues from the small intestine, large intestine, or fatty tissue covering the intestines (omentum). Umbilical hernias in adults tend to get worse over time, and they require surgical treatment. There are several types of umbilical hernias. You may have: A hernia located just above or below the umbilicus (indirect hernia). This is the most common type of umbilical hernia in adults. A hernia that forms through an opening formed by the umbilicus (direct hernia). A hernia that comes and goes (reducible hernia). A reducible hernia may be visible only when you strain, lift something heavy, or cough. This type of hernia can be pushed back into the abdomen (reduced). A hernia that traps abdominal tissue inside the hernia (incarcerated hernia). This type of hernia cannot be reduced. A hernia that cuts off blood flow to the tissues inside the hernia (strangulated hernia). The tissues can start to die if this happens. This type of hernia requires emergency treatment.  What are the causes? An umbilical hernia happens when tissue inside the abdomen presses on a weak area of the abdominal muscles. What increases the risk? You may have a greater risk of this condition if you: Are  obese. Have had several pregnancies. Have a buildup of fluid inside your abdomen (ascites). Have had surgery that weakens the abdominal muscles.  What are the signs or symptoms? The main symptom of this condition is a painless bulge at or near the belly button. A reducible hernia may be visible only when you strain, lift something heavy, or cough. Other symptoms may include: Dull pain. A feeling of pressure.  Symptoms of a strangulated hernia may include: Pain that gets increasingly worse. Nausea and vomiting. Pain when pressing on the hernia. Skin over the hernia becoming red or purple. Constipation. Blood in the stool.  How is this diagnosed? This condition may be diagnosed based on: A physical exam. You may be asked to cough or strain while standing. These actions increase the pressure inside your abdomen and force the hernia through the opening in your muscles. Your health care provider may try to reduce the hernia by pressing on it. Your symptoms and medical history.  How is this treated? Surgery is the only treatment for an umbilical hernia. Surgery for a strangulated hernia is done as soon as possible. If you have a small hernia that is not incarcerated, you may need to lose weight before having surgery. Follow these instructions at home: Lose weight, if told by your health care provider. Do not try to push the hernia back in. Watch your hernia for any changes in color or size. Tell your health care provider if any changes occur. You may need to avoid activities that increase pressure on  your hernia. Do not lift anything that is heavier than 10 lb (4.5 kg) until your health care provider says that this is safe. Take over-the-counter and prescription medicines only as told by your health care provider. Keep all follow-up visits as told by your health care provider. This is important. Contact a health care provider if: Your hernia gets larger. Your hernia becomes painful. Get  help right away if: You develop sudden, severe pain near the area of your hernia. You have pain as well as nausea or vomiting. You have pain and the skin over your hernia changes color. You develop a fever. This information is not intended to replace advice given to you by your health care provider. Make sure you discuss any questions you have with your health care provider. Document Released: 04/23/2016 Document Revised: 07/25/2016 Document Reviewed: 04/23/2016 Elsevier Interactive Patient Education  [employer].

## 2023-05-30 DIAGNOSIS — C61 Malignant neoplasm of prostate: Secondary | ICD-10-CM | POA: Diagnosis not present

## 2023-06-06 DIAGNOSIS — C778 Secondary and unspecified malignant neoplasm of lymph nodes of multiple regions: Secondary | ICD-10-CM | POA: Diagnosis not present

## 2023-06-06 DIAGNOSIS — C61 Malignant neoplasm of prostate: Secondary | ICD-10-CM | POA: Diagnosis not present

## 2023-06-07 ENCOUNTER — Inpatient Hospital Stay: Payer: PPO | Attending: Genetic Counselor | Admitting: Genetic Counselor

## 2023-06-07 ENCOUNTER — Other Ambulatory Visit: Payer: Self-pay | Admitting: Genetic Counselor

## 2023-06-07 ENCOUNTER — Encounter: Payer: Self-pay | Admitting: Hematology

## 2023-06-07 ENCOUNTER — Encounter: Payer: Self-pay | Admitting: Genetic Counselor

## 2023-06-07 ENCOUNTER — Inpatient Hospital Stay: Payer: PPO

## 2023-06-07 DIAGNOSIS — C61 Malignant neoplasm of prostate: Secondary | ICD-10-CM

## 2023-06-07 DIAGNOSIS — Z8041 Family history of malignant neoplasm of ovary: Secondary | ICD-10-CM | POA: Diagnosis not present

## 2023-06-07 DIAGNOSIS — Z808 Family history of malignant neoplasm of other organs or systems: Secondary | ICD-10-CM

## 2023-06-07 DIAGNOSIS — Z8042 Family history of malignant neoplasm of prostate: Secondary | ICD-10-CM

## 2023-06-07 LAB — GENETIC SCREENING ORDER

## 2023-06-07 NOTE — Progress Notes (Signed)
REFERRING PROVIDER: Eustaquio Boyden, MD [address] [place],  [place] [address]  PRIMARY PROVIDER:  Eustaquio Boyden, MD  PRIMARY REASON FOR VISIT:  1. Family history of prostate cancer   2. Family history of peritoneal cancer   3. Malignant neoplasm of prostate (HCC)      HISTORY OF PRESENT ILLNESS:   Mr. Buckbee, a 72 y.o. male, was seen for a [facility] cancer genetics consultation at the request of Dr. Sharen Hones due to a personal and family history of cancer.  Mr. Edenfield presents to clinic today to discuss the possibility of a hereditary predisposition to cancer, genetic testing, and to further clarify his future cancer risks, as well as potential cancer risks for family members.   In 2022, at the age of 31, Mr. Hamade was diagnosed with cancer of the prostate. Gleason score is 4 x 5 = 9.     CANCER HISTORY:  Oncology History  Malignant neoplasm of prostate (HCC)  08/17/2019 Initial Diagnosis   Malignant neoplasm of prostate (HCC)   09/03/2021 Cancer Staging   Staging form: Prostate, AJCC 8th Edition - Clinical: Stage IVB (cTX, cNX, cM1, Grade Group: 5) - Signed by Benjiman Core, MD on 09/03/2021 Histologic grading system: 5 grade system     Past Medical History:  Diagnosis Date   Anticoagulant long-term use    eliquis--- managed by pcp  for recurrent PEs   Cancer involving bladder by direct extension from prostate (HCC) 06/28/2019   Cystoscopy [facility]) - 2 bladder tumors, 1 on anterior prostate planned TURBT/TURP 06/2019 Biopsy - Gleason 5+4=9 prostate cancer [facility] started 07/2019 planned metastatic survey   Family history of hemochromatosis    Family history of peritoneal cancer    Family history of prostate cancer    History of pulmonary embolus (PE) 06/2019   incidental finding on CT when staging done for prostate cancer dx,  acute PEs RML/ RUL treated w/ blood thinner;   recurrent 06-23-2021  RLL ;  recurrent 02-11-2022  this time is on long term  use eliquis  (11-11-2022  he had never had blood clots before 2020 and note since 03/ 2023   History of syncope 2006   previousliy followed by cardiologist- dr Graciela Husbands--  recurrent syncope,  implanted loop recorder 10/ 2006,  explanted 03/ 2017 since no syncope for 10 yrs   (11-11-2022  per pt no issues since 2017)   Hypertension    monitor by pcp and pt monitoring at home---  has improved , pcp tood pt off med   Malignant neoplasm prostate [facility]) 06/2019   urologist--  dr herrick/  radiation oncologist--- dr Kathrynn Running---   dx 07/ 2020,  mets to bladder,  Gleason 5+4   Nocturia    Seasonal allergies    Wears glasses     Past Surgical History:  Procedure Laterality Date   CYSTOSCOPY W/ RETROGRADES Bilateral 07/06/2019   Procedure: CYSTOSCOPY WITH RETROGRADE PYELOGRAM, BILATERAL STENT PLACEMENT;  Surgeon: Crist Fat, MD;  Location: [facility];  Service: Urology;  Laterality: Bilateral;   EP IMPLANTABLE DEVICE N/A 02/25/2016   Procedure: Loop Recorder Removal;  Surgeon: Duke Salvia, MD;  Location: [facility]IVE [facility];  Service: Cardiovascular;  Laterality: N/A;    EXPLANTED   GOLD SEED IMPLANT N/A 11/16/2022   Procedure: GOLD SEED IMPLANT INTO PROSTATE;  Surgeon: Bjorn Pippin, MD;  Location: [facility];  Service: Urology;  Laterality: N/A;   IR NEPHROSTOMY PLACEMENT LEFT  02/14/2020  IR NEPHROSTOMY PLACEMENT RIGHT  02/14/2020   LIPOMA EXCISION N/A 02/09/2016   Procedure: EXCISION NECK LIPOMA;  Surgeon: Chevis Pretty III, MD;  Location: [facility];  Service: General;  Laterality: N/A;   LOOP RECORDER INSERTION  10/04/2005   @[facility]  by dr Graciela Husbands   TRANSRECTAL ULTRASOUND N/A 11/16/2022   Procedure: TRANSRECTAL ULTRASOUND;  Surgeon: Bjorn Pippin, MD;  Location: [facility];  Service: Urology;  Laterality: N/A;   TRANSURETHRAL RESECTION OF BLADDER TUMOR N/A 07/06/2019   Procedure: TRANSURETHRAL RESECTION OF BLADDER TUMOR (TURBT);  Surgeon: Crist Fat, MD;  Location: [facility];  Service: Urology;  Laterality: N/A;    Social History   Socioeconomic History   Marital status: Married    Spouse name: Not on file   Number of children: Not on file   Years of education: Not on file   Highest education level: Not on file  Occupational History   Not on file  Tobacco Use   Smoking status: Never    Passive exposure: Never   Smokeless tobacco: Never  Vaping Use   Vaping Use: Never used  Substance and Sexual Activity   Alcohol use: Yes    Alcohol/week: 8.0 standard drinks of alcohol    Types: 7 Cans of beer, 1 Shots of liquor per week    Comment: 11-11-2022  per pt one beer daily and one drink on weekend   Drug use: Never   Sexual activity: Not on file  Other Topics Concern   Not on file  Social History Narrative   Lives with wife   Grown children   Occ: retired Engineer, civil (consulting), Quarry manager   Edu: Bachelor of Art   Activity: stays active outdoors working   Diet: some water, fruits/vegetables daily   Social Determinants of Health   Financial Resource Strain: Low Risk  (04/12/2023)   Overall Financial Resource Strain (CARDIA)    Difficulty of Paying Living Expenses: Not hard at all  Food Insecurity: No Food Insecurity (04/12/2023)   Hunger Vital Sign    Worried About Running Out of Food in the Last Year: Never true    Ran Out of Food in the Last Year: Never true  Transportation Needs: No Transportation Needs (04/12/2023)   PRAPARE - Administrator, Civil Service (Medical): No    Lack of Transportation (Non-Medical): No  Physical Activity: Sufficiently Active (04/12/2023)   Exercise Vital Sign    Days of Exercise per Week: 3 days    Minutes of Exercise per Session: 60 min  Stress: No Stress Concern Present (04/12/2023)   [employer] of Occupational Health - Occupational Stress Questionnaire    Feeling of Stress : Not at all  Social Connections: Moderately Integrated (04/12/2023)   Social Connection and  Isolation Panel [NHANES]    Frequency of Communication with Friends and Family: More than three times a week    Frequency of Social Gatherings with Friends and Family: More than three times a week    Attends Religious Services: More than 4 times per year    Active Member of [employer] or Organizations: No    Attends Banker Meetings: Never    Marital Status: Married     FAMILY HISTORY:  We obtained a detailed, 4-generation family history.  Significant diagnoses are listed below: Family History  Problem Relation Age of Onset   Hyperlipidemia Mother    Cancer Mother        peritoneal cancer  Hemochromatosis Father        s/p liver transplant - pt screened negative 07/2015   Prostate cancer Maternal Uncle    Prostate cancer Maternal Uncle    Stroke Neg Hx    Diabetes Neg Hx    CAD Neg Hx      The patient has two sons who are cancer free.  He has two sisters who are cancer free.  His parents are both deceased.  The patient's father died at 39.  There is no reported family history of cancer on his side of the family.  The patients mother had peritoneum cancer at age 94.  She had two brothers who had prostate cancer.  There is no other reported family history of cancer.  Mr. Carpenito is unaware of previous family history of genetic testing for hereditary cancer risks. Patient's maternal ancestors are of Caucasian descent, and paternal ancestors are of Caucasian descent. There is no reported Ashkenazi Jewish ancestry. There is no known consanguinity.  GENETIC COUNSELING ASSESSMENT: Mr. Briski is a 72 y.o. male with a personal and family history of cancer which is somewhat suggestive of a hereditary cancer syndrome and predisposition to cancer given the combination of cancer. We, therefore, discussed and recommended the following at today's visit.   DISCUSSION: We discussed that, in general, most cancer is not inherited in families, but instead is sporadic or familial. Sporadic  cancers occur by chance and typically happen at older ages (>50 years) as this type of cancer is caused by genetic changes acquired during an individual's lifetime. Some families have more cancers than would be expected by chance; however, the ages or types of cancer are not consistent with a known genetic mutation or known genetic mutations have been ruled out. This type of familial cancer is thought to be due to a combination of multiple genetic, environmental, hormonal, and lifestyle factors. While this combination of factors likely increases the risk of cancer, the exact source of this risk is not currently identifiable or testable.  We discussed that up to 15% of prostate cancer is hereditary, with most cases associated with BRCA mutations.  There are other genes that can be associated with hereditary prostate cancer syndromes.  These include ATM, CHEK2, PALB2 and HOXB13.  We discussed that testing is beneficial for several reasons including knowing how to follow individuals after completing their treatment, identifying whether potential treatment options such as PARP inhibitors would be beneficial, and understand if other family members could be at risk for cancer and allow them to undergo genetic testing.   We reviewed the characteristics, features and inheritance patterns of hereditary cancer syndromes. We also discussed genetic testing, including the appropriate family members to test, the process of testing, insurance coverage and turn-around-time for results. We discussed the implications of a negative, positive, carrier and/or variant of uncertain significant result. Mr. Roessler  was offered a common hereditary cancer panel (47 genes) and an expanded pan-cancer panel (77 genes). Mr. Sewer was informed of the benefits and limitations of each panel, including that expanded pan-cancer panels contain genes that do not have clear management guidelines at this point in time.  We also discussed that as  the number of genes included on a panel increases, the chances of variants of uncertain significance increases. Mr. Remme decided to pursue genetic testing for the Multi-cancer + RNA gene panel.   The Multi-Cancer + RNA Panel offered by [facility] includes sequencing and/or deletion/duplication analysis of the following 70 genes:  AIP*, ALK, APC*,  ATM*, AXIN2*, BAP1*, BARD1*, BLM*, BMPR1A*, BRCA1*, BRCA2*, BRIP1*, CDC73*, CDH1*, CDK4, CDKN1B*, CDKN2A, CHEK2*, CTNNA1*, DICER1*, EPCAM (del/dup only), EGFR, FH*, FLCN*, GREM1 (promoter dup only), HOXB13, KIT, LZTR1, MAX*, MBD4, MEN1*, MET, MITF, MLH1*, MSH2*, MSH3*, MSH6*, MUTYH*, NF1*, NF2*, NTHL1*, PALB2*, PDGFRA, PMS2*, POLD1*, POLE*, POT1*, PRKAR1A*, PTCH1*, PTEN*, RAD51C*, RAD51D*, RB1*, RET, SDHA* (sequencing only), SDHAF2*, SDHB*, SDHC*, SDHD*, SMAD4*, SMARCA4*, SMARCB1*, SMARCE1*, STK11*, SUFU*, TMEM127*, TP53*, TSC1*, TSC2*, VHL*. RNA analysis is performed for * genes.  Based on Mr. Pfenning's personal and family history of cancer, he meets medical criteria for genetic testing. Despite that he meets criteria, he may still have an out of pocket cost. We discussed that if his out of pocket cost for testing is over $100, the laboratory will call and confirm whether he wants to proceed with testing.  If the out of pocket cost of testing is less than $100 he will be billed by the genetic testing laboratory.   We discussed that some people do not want to undergo genetic testing due to fear of genetic discrimination.  The Genetic Information Nondiscrimination Act (GINA) was signed into federal law in 2008. GINA prohibits health insurers and most employers from discriminating against individuals based on genetic information (including the results of genetic tests and family history information). According to GINA, health insurance companies cannot consider genetic information to be a preexisting condition, nor can they use it to make decisions regarding coverage  or rates. GINA also makes it illegal for most employers to use genetic information in making decisions about hiring, firing, promotion, or terms of employment. It is important to note that GINA does not offer protections for life insurance, disability insurance, or long-term care insurance. GINA does not apply to those in the [employer], those who work for companies with less than 15 employees, and new life insurance or long-term disability insurance policies.  Health status due to a cancer diagnosis is not protected under GINA. More information about GINA can be found by visiting [email or web address].  PLAN: After considering the risks, benefits, and limitations, Mr. Spigelmyer provided informed consent to pursue genetic testing and the blood sample was sent to [facility] for analysis of the Multi-cancer + RNA. Results should be available within approximately 2-3 weeks' time, at which point they will be disclosed by telephone to Mr. Swartzentruber, as will any additional recommendations warranted by these results. Mr. Mitrovic will receive a summary of his genetic counseling visit and a copy of his results once available. This information will also be available in Epic.   Lastly, we encouraged Mr. Bladen to remain in contact with cancer genetics annually so that we can continuously update the family history and inform him of any changes in cancer genetics and testing that may be of benefit for this family.   Mr. Folwell questions were answered to his satisfaction today. Our contact information was provided should additional questions or concerns arise. Thank you for the referral and allowing [place] to share in the care of your patient.   Eliabeth Shoff P. Lowell Guitar, MS, [facility] Licensed, Patent attorney [email or web address] phone: [phone]  The patient was seen for a total of 20 minutes in face-to-face genetic counseling.  The patient was seen alone.  Drs. Meliton Rattan, and/or [place] were available  for questions, if needed..    _______________________________________________________________________ For Office Staff:  Number of people involved in session: 1 Was an Intern/ student involved with case: no

## 2023-06-08 ENCOUNTER — Other Ambulatory Visit: Payer: Self-pay | Admitting: Urology

## 2023-06-08 DIAGNOSIS — C61 Malignant neoplasm of prostate: Secondary | ICD-10-CM

## 2023-06-23 ENCOUNTER — Telehealth: Payer: Self-pay | Admitting: Genetic Counselor

## 2023-06-23 NOTE — Telephone Encounter (Signed)
Revealed genetic test reuslts.  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  We discussed CHIP and true mosaicism.  Explained that we could do a skin bx to determine whether this is true mosaicism vs CHIP.  We could also go straight to his children and test his sons.  He will think about how to proceed but will let his children know of the result and they can get tested.

## 2023-06-24 ENCOUNTER — Encounter: Payer: Self-pay | Admitting: Genetic Counselor

## 2023-06-24 ENCOUNTER — Ambulatory Visit: Payer: Self-pay | Admitting: Genetic Counselor

## 2023-06-24 DIAGNOSIS — Z1379 Encounter for other screening for genetic and chromosomal anomalies: Secondary | ICD-10-CM | POA: Insufficient documentation

## 2023-06-24 DIAGNOSIS — C61 Malignant neoplasm of prostate: Secondary | ICD-10-CM

## 2023-06-24 NOTE — Progress Notes (Signed)
GENETIC TEST RESULTS   Patient Name: Shane [name] Patient Age: 72 y.o. Encounter Date: 06/24/2023  Referring Provider: Crist Fat, MD    Mr. Crehan was seen in the Cancer Genetics clinic on June 07, 2023 due to a personal and family history of cancer and concern regarding a hereditary predisposition to cancer in the family. Please refer to the prior Genetics clinic note for more information regarding Mr. Guo's medical and family histories and our assessment at the time.   FAMILY HISTORY:  We obtained a detailed, 4-generation family history.  Significant diagnoses are listed below: Family History  Problem Relation Age of Onset   Hyperlipidemia Mother    Cancer Mother        peritoneal cancer   Hemochromatosis Father        s/p liver transplant - pt screened negative 07/2015   Prostate cancer Maternal Uncle    Prostate cancer Maternal Uncle    Stroke Neg Hx    Diabetes Neg Hx    CAD Neg Hx        The patient has two sons who are cancer free.  He has two sisters who are cancer free.  His parents are both deceased.   The patient's father died at 79.  There is no reported family history of cancer on his side of the family.   The patients mother had peritoneum cancer at age 38.  She had two brothers who had prostate cancer.  There is no other reported family history of cancer.   Mr. Belling is unaware of previous family history of genetic testing for hereditary cancer risks. Patient's maternal ancestors are of Caucasian descent, and paternal ancestors are of Caucasian descent. There is no reported Ashkenazi Jewish ancestry. There is no known consanguinity.  GENETIC TESTING:  At the time of Mr. Blackerby visit, we recommended he pursue genetic testing of the Multi-Cancer+RNA panel test. The genetic testing reported out on June 21, 2023 through the Multi-Cancer Panel + RNA offered by [employer] identified a single, heterozygous pathogenic gene mutation called  ATM, c.8806del (p.[id number]*2). There were no deleterious mutations in AIP*, ALK, APC*, AXIN2*, BAP1*, BARD1*, BLM*, BMPR1A*, BRCA1*, BRCA2*, BRIP1*, CDC73*, CDH1*, CDK4, CDKN1B*, CDKN2A, CHEK2*, CTNNA1*, DICER1*, EPCAM (del/dup only), EGFR, FH*, FLCN*, GREM1 (promoter dup only), HOXB13, KIT, LZTR1, MAX*, MBD4, MEN1*, MET, MITF, MLH1*, MSH2*, MSH3*, MSH6*, MUTYH*, NF1*, NF2*, NTHL1*, PALB2*, PDGFRA, PMS2*, POLD1*, POLE*, POT1*, PRKAR1A*, PTCH1*, PTEN*, RAD51C*, RAD51D*, RB1*, RET, SDHA* (sequencing only), SDHAF2*, SDHB*, SDHC*, SDHD*, SMAD4*, SMARCA4*, SMARCB1*, SMARCE1*, STK11*, SUFU*, TMEM127*, TP53*, TSC1*, TSC2*, VHL*. RNA analysis is performed for * genes.    Genetic testing did identify a possibly mosaic variant of uncertain significance (VUS) was identified in the ATM gene called c.8806G>C (p.Glu2936Gln).  At this time, it is unknown if this variant is associated with increased cancer risk or if this is a normal finding, but most variants such as this get reclassified to being inconsequential. It should not be used to make medical management decisions. With time, we suspect the lab will determine the significance of this variant, if any. If we do learn more about it, we will try to contact Mr. Doss to discuss it further. However, it is important to stay in touch with [place] periodically and keep the address and phone number up to date.  DISCUSSION:Explanation of results As I explained to Mr. Glazebrook, his personal and family history is not fully consistent with what we typically see in families that have ATM mutations. Given this,  I explained other possible explanations for his result.  It is important to recognize that we do not yet know which of the following scenarios is the explanation for Mr. Dolezal.  Possibly mosaic results can be the result of 1) clonal hematopoiesis of indeterminate potential (CHIP), which is the presence of acquired/somatic variants, that may be the result of increasing  age or treatment history, 2) circulating tumor cells from an underlying hematological condition (neoplasia, malignancy, or gammopathy), 3) constitutional mosaicism (i.e. mosaic ATM mutation), or 4) a technical sequencing artifact.    1) Mr. Silvio may have an ATM mutation in a portion of his bone marrow cells. Clonal hematopoesis of Indeterminate Potential (CHIP) is a new entity in which somatic mutations are found in cells of the blood or bone marrow, but no other criteria for hematologic neoplasia are met. Its prevalence rises with age and is roughly 10% among persons aged 35 to 49. While not a malignancy (cancer) in itself, individuals with CHIP have an increased likelihood of progressing to malignancy at a rate of 0.5-1% per year (source: [id number]). [facility], which completed his genetic testing, reported by phone that they measured the frequency of the ATM  mutation to be in only 14-20% of his white blood cells.   For Mr. Suchecki medical providers (per [employer]): Patients with CHIP should be followed over time, but there are no evidence-based guidelines and the optimal frequency of evaluation is unclear. At present, a diagnosis of CHIP has no therapeutic implications, and there are no data to suggest that any intervention is beneficial.  If a clonal mutation is found in the setting of normal blood counts, a confirmatory bone marrow biopsy is not necessary In patients with CHIP in particular it is important to screen for traditional cardiac risk factors (hyperlipidemia, hypertension, diabetes mellitus, and smoking) and aggressive management of these risk factors in patients with CHIP.   It is important to note that CHIP is due to a random spontaneous acquired mutation and is not hereditary.  2) A second possibility is that he could have an underlying hematological condition. Individuals with CHIP, over time, can develop hematological conditions. Mr. Janusz may need to be screened  periodically to identify any changes to his status early.    3) Alternately, Mr. Madani may indeed have ATM mutation but in only some of his cells (mosaic ATM) in which case he does have a hereditary predisposition to cancer and his family members would also be at risk for developing cancer. All children of individuals with ATM mutations have up to a 50% chance to also have the condition, and I strongly encourage Mr. Gamblin to discuss his results with her children. Testing other relatives can determine who has inherited this mutation, and therefore needs increased surveillance. Testing can also determine who has not inherited this mutation, and therefore may not require increased surveillance. Because we know the exact change in his ATM gene we are able to offer specific testing to other family members. I explained to Mr. Crutcher that testing for his kids would be free through the same genetic testing laboratory that completed her testing, as per the lab's policy.  It is recommended that other family members coordinate their testing through a genetic specialist to ensure that pre-test genetic counseling regarding cancer risks and management options is performed and the correct test is ordered. Mr. Coker family members will need a copy of his result for the coordination of their testing. We are happy to meet with his  relatives, or refer them to cancer genetics centers in their areas.    Individuals with a ATM mutation are at a greater risk for having children with Ataxia-telangiectasia (A-T).  AT is characterized by progressive cerebellar degeneration (ataxia), dilated blood vessels in the eyes and skin (telangiectasia), immunodeficiency, chromosomal instability, increased sensitivity to ionizing radiation and a predisposition to lymphoma and leukemia.  Therefore, individuals of childbearing age who have a known ATM mutation may want to consider having their spouse tested to determine their risk for  having a child with A-T.  Studies of these families demonstrated increased incident of breast cancer in the mothers (who are heterozygous carriers) of the affected children, thus prompting further evaluation of the relationship between breast cancer and ATM.  Women who are heterozygous ATM carriers have an increase breast cancer risk.  They have 5-fold increased risk of breast cancer <50 years, and 2-3 fold increased risk for breast cancer overall.  In families with familial breast cancer that were negative for BRCA1 or BRCA2 genes, approximately 2.7% of women were found to have one ATM mutation.  In families with both breast cancer and leukemia, 6.7% of women were found to have one ATM mutation.  There has been some evidence that radiation treatment may increase the risk for breast cancer in the contralateral breast. Despite this risk, we do not recommend declining radiation treatment for her breast cancer if it is recommended, as the risk for having a recurrence of breast cancer based on not going through radiation may be greater than her risk for getting breast cancer again from the radiation.   Management for individuals with ATM mutations can be found in the NCCN guidelines (v.2.2024).  These guidelines recommend the following:  Breast Cancer    Absolute risk: 20-40% Screening: Annual mammogram with consideration of tomosynthesis at age 80 and consider breast MRI with contrast starting at age 34 - 8 years. Risk Reducing Mastectomy: Evidence insufficient, consider based on family history  Ovarian Cancer  Absolute risk: 2-3% Evidence is insufficient for risk-reducing salpingo-oophorectomy (RRSO).  This risk should be managed by family history.  Pancreatic Cancer   Absolute risk: ~5-10% Screen starting at age 84 for individuals with a family history of pancreatic cancer.  Prostate Cancer Emerging evidence for association with increased risk.  No recommendations for screening at this  time.  ADDITIONAL GENETIC TESTING: To help identify if Mr. Kallam has an ATM mutation by constitutional mosaicism, a skin biopsy would need to be performed.  If the ATM pathogenic variant is identified in the skin fibroblasts, it is suggestive that he has an ATM mutation. The patient and his relatives may be at risk for ATM-related cancers.    If the variant is not identified in skin fibroblasts, it is suggestive of the variant being acquired in blood over time and not heritable (somatic mosaicism). However, constitutional mosaicism cannot be ruled out and therefore their reproductive risk cannot be determined.   We discussed with Mr. Isham that his genetic testing was fairly extensive.  If there are genes identified to increase cancer risk that can be analyzed in the future, we would be happy to discuss and coordinate this testing at that time.    FAMILY MEMBERS: Mr. Zea children could have up to a 50% chance to have inherited this mutation. We recommend they have genetic testing for this same mutation, as identifying the presence of this mutation would allow them to also take advantage of risk-reducing measures.   Individuals with a  single pathogenic ATM variant are also carriers of autosomal recessive Ataxia Telangiectasia. Ataxia Telangiectasia is characterized by childhood onset of progressive neurologic manifestations, immunodeficiency, pulmonary disease, and increased risk for cancer. For there to be a risk of Ataxia Telangiectasia in offspring, both the patient and their partner would each have to carry a pathogenic variant in ATM; in this case, the risk to have an affected child is 25%. It is important that all of Mr. Decatur relatives (both men and women) know of the presence of this gene mutation. Site-specific genetic testing can sort out who in the family is at risk and who is not. It is important that all of Mr. Buntrock relatives (both men and women) know of the presence of  this gene mutation. Site-specific genetic testing can sort out who in the family is at risk and who is not.   In conclusion, Mr. Mchaffie genetic testing did reveal a mutation as well as a VUS both occurring in the ATM gene, and the implications of his result for his family members are currently unable to be determined. We are happy to work with Mr. Spreen  to facilitate any additional testing for his family members that may clarify his family member's risks to develop cancer. I offered to speak with his children to explain his results and provided my direct phone number at [phone].  Mr. Pinzon questions were answered to his satisfaction today, and he knows he is welcome to call anytime with additional questions.   Kerri Asche P. Lowell Guitar, MS, [facility] Licensed, Patent attorney [email or web address] phone: [phone]  The patient was seen for a total of 20 minutes in telephone genetic counseling.

## 2023-07-02 ENCOUNTER — Other Ambulatory Visit: Payer: Self-pay | Admitting: Family Medicine

## 2023-07-02 ENCOUNTER — Encounter: Payer: Self-pay | Admitting: Family Medicine

## 2023-07-02 DIAGNOSIS — Z8349 Family history of other endocrine, nutritional and metabolic diseases: Secondary | ICD-10-CM

## 2023-07-02 DIAGNOSIS — E7800 Pure hypercholesterolemia, unspecified: Secondary | ICD-10-CM

## 2023-07-04 ENCOUNTER — Other Ambulatory Visit: Payer: Self-pay | Admitting: Family Medicine

## 2023-07-04 DIAGNOSIS — Z86711 Personal history of pulmonary embolism: Secondary | ICD-10-CM

## 2023-07-05 NOTE — Telephone Encounter (Signed)
Eliquis Last filled:  06/04/23, #60 Last OV:  05/20/23, umbilical hernia Next OV:  07/13/23, CPE

## 2023-07-06 ENCOUNTER — Other Ambulatory Visit (INDEPENDENT_AMBULATORY_CARE_PROVIDER_SITE_OTHER): Payer: PPO

## 2023-07-06 DIAGNOSIS — Z8349 Family history of other endocrine, nutritional and metabolic diseases: Secondary | ICD-10-CM | POA: Diagnosis not present

## 2023-07-06 DIAGNOSIS — E7800 Pure hypercholesterolemia, unspecified: Secondary | ICD-10-CM

## 2023-07-06 LAB — CBC WITH DIFFERENTIAL/PLATELET
Basophils Absolute: 0 10*3/uL (ref 0.0–0.1)
Basophils Relative: 0.8 % (ref 0.0–3.0)
Eosinophils Absolute: 0.3 10*3/uL (ref 0.0–0.7)
Eosinophils Relative: 7.1 % — ABNORMAL HIGH (ref 0.0–5.0)
HCT: 40.1 % (ref 39.0–52.0)
Hemoglobin: 13.3 g/dL (ref 13.0–17.0)
Lymphocytes Relative: 18.5 % (ref 12.0–46.0)
Lymphs Abs: 0.7 10*3/uL (ref 0.7–4.0)
MCHC: 33.1 g/dL (ref 30.0–36.0)
MCV: 98.9 fl (ref 78.0–100.0)
Monocytes Absolute: 0.3 10*3/uL (ref 0.1–1.0)
Monocytes Relative: 9.1 % (ref 3.0–12.0)
Neutro Abs: 2.4 10*3/uL (ref 1.4–7.7)
Neutrophils Relative %: 64.5 % (ref 43.0–77.0)
Platelets: 195 10*3/uL (ref 150.0–400.0)
RBC: 4.06 Mil/uL — ABNORMAL LOW (ref 4.22–5.81)
RDW: 13.1 % (ref 11.5–15.5)
WBC: 3.8 10*3/uL — ABNORMAL LOW (ref 4.0–10.5)

## 2023-07-06 LAB — COMPREHENSIVE METABOLIC PANEL
ALT: 38 U/L (ref 0–53)
AST: 33 U/L (ref 0–37)
Albumin: 4.2 g/dL (ref 3.5–5.2)
Alkaline Phosphatase: 46 U/L (ref 39–117)
BUN: 19 mg/dL (ref 6–23)
CO2: 27 mEq/L (ref 19–32)
Calcium: 9.7 mg/dL (ref 8.4–10.5)
Chloride: 105 mEq/L (ref 96–112)
Creatinine, Ser: 1.43 mg/dL (ref 0.40–1.50)
GFR: 48.81 mL/min — ABNORMAL LOW (ref >60.00)
Glucose, Bld: 100 mg/dL — ABNORMAL HIGH (ref 70–99)
Potassium: 4.4 mEq/L (ref 3.5–5.1)
Sodium: 139 mEq/L (ref 135–145)
Total Bilirubin: 0.7 mg/dL (ref 0.2–1.2)
Total Protein: 6.8 g/dL (ref 6.0–8.3)

## 2023-07-06 LAB — LIPID PANEL
Cholesterol: 196 mg/dL (ref 0–200)
HDL: 50 mg/dL (ref >39.00)
LDL Cholesterol: 123 mg/dL — ABNORMAL HIGH (ref 0–99)
NonHDL: 145.62
Total CHOL/HDL Ratio: 4
Triglycerides: 115 mg/dL (ref 0.0–149.0)
VLDL: 23 mg/dL (ref 0.0–40.0)

## 2023-07-07 ENCOUNTER — Ambulatory Visit: Payer: Self-pay | Admitting: Surgery

## 2023-07-07 ENCOUNTER — Ambulatory Visit: Payer: PPO | Admitting: Surgery

## 2023-07-07 ENCOUNTER — Encounter: Payer: Self-pay | Admitting: Surgery

## 2023-07-07 VITALS — BP 168/91 | HR 60 | Temp 98.5°F | Ht 71.0 in | Wt 195.0 lb

## 2023-07-07 DIAGNOSIS — K429 Umbilical hernia without obstruction or gangrene: Secondary | ICD-10-CM | POA: Diagnosis not present

## 2023-07-07 NOTE — Progress Notes (Signed)
Patient ID: Shane [name], male   DOB: 05-16-1951, 72 y.o.   MRN: [id number]  Chief Complaint: Umbilical hernia  History of Present Illness Shane [name] is a 72 y.o. male with with umbilical hernia, small bulge noted about a year ago.  Slight increase in size of the bulge.  Some pain/tenderness with heavy lifting.  He can push the bulge back in with 1 finger and have it come back out within the next degree of effort.  No history of nausea or vomiting or bowel habit changes.  Is currently engaged in harvesting his garden, anticipates some heavy lifting over the next month, feels that he could lay low and better convalesce in August.  He takes a blood thinner, Eliquis for history of recurrent pulmonary emboli.  Past Medical History Past Medical History:  Diagnosis Date   Anticoagulant long-term use    eliquis--- managed by pcp  for recurrent PEs   Cancer involving bladder by direct extension from prostate (HCC) 06/28/2019   Cystoscopy [facility]) - 2 bladder tumors, 1 on anterior prostate planned TURBT/TURP 06/2019 Biopsy - Gleason 5+4=9 prostate cancer [facility] started 07/2019 planned metastatic survey   Family history of hemochromatosis    Family history of peritoneal cancer    Family history of prostate cancer    History of pulmonary embolus (PE) 06/2019   incidental finding on CT when staging done for prostate cancer dx,  acute PEs RML/ RUL treated w/ blood thinner;   recurrent 06-23-2021  RLL ;  recurrent 02-11-2022  this time is on long term use eliquis  (11-11-2022  he had never had blood clots before 2020 and note since 03/ 2023   History of syncope 2006   previousliy followed by cardiologist- dr Graciela Husbands--  recurrent syncope,  implanted loop recorder 10/ 2006,  explanted 03/ 2017 since no syncope for 10 yrs   (11-11-2022  per pt no issues since 2017)   Hypertension    monitor by pcp and pt monitoring at home---  has improved , pcp tood pt off med   Malignant neoplasm prostate [facility]) 06/2019    urologist--  dr herrick/  radiation oncologist--- dr Kathrynn Running---   dx 07/ 2020,  mets to bladder,  Gleason 5+4   Nocturia    Seasonal allergies    Wears glasses       Past Surgical History:  Procedure Laterality Date   CYSTOSCOPY W/ RETROGRADES Bilateral 07/06/2019   Procedure: CYSTOSCOPY WITH RETROGRADE PYELOGRAM, BILATERAL STENT PLACEMENT;  Surgeon: Crist Fat, MD;  Location: [facility];  Service: Urology;  Laterality: Bilateral;   EP IMPLANTABLE DEVICE N/A 02/25/2016   Procedure: Loop Recorder Removal;  Surgeon: Duke Salvia, MD;  Location: [facility] INVASIVE CV LAB;  Service: Cardiovascular;  Laterality: N/A;    EXPLANTED   GOLD SEED IMPLANT N/A 11/16/2022   Procedure: GOLD SEED IMPLANT INTO PROSTATE;  Surgeon: Bjorn Pippin, MD;  Location: [facility];  Service: Urology;  Laterality: N/A;   IR NEPHROSTOMY PLACEMENT LEFT  02/14/2020   IR NEPHROSTOMY PLACEMENT RIGHT  02/14/2020   LIPOMA EXCISION N/A 02/09/2016   Procedure: EXCISION NECK LIPOMA;  Surgeon: Chevis Pretty III, MD;  Location: [facility];  Service: General;  Laterality: N/A;   LOOP RECORDER INSERTION  10/04/2005   @[facility]  by dr Graciela Husbands   TRANSRECTAL ULTRASOUND N/A 11/16/2022   Procedure: TRANSRECTAL ULTRASOUND;  Surgeon: Bjorn Pippin, MD;  Location: [facility];  Service: Urology;  Laterality: N/A;   TRANSURETHRAL RESECTION OF BLADDER  TUMOR N/A 07/06/2019   Procedure: TRANSURETHRAL RESECTION OF BLADDER TUMOR (TURBT);  Surgeon: Crist Fat, MD;  Location: [facility];  Service: Urology;  Laterality: N/A;    Allergies  Allergen Reactions   Effexor Xr [Venlafaxine Hcl Er] Other (See Comments)    Psychiatric symptoms   Hydrocodone Itching and Other (See Comments)    Per pt turns red from chest up, looks like sunburn and starts itching badly   Oxycodone Itching and Other (See Comments)    Per pt turns red from chest up, looks like sunburn and starts itching badly   Bactrim  [Sulfamethoxazole-Trimethoprim] Hives and Rash   Penicillins Swelling and Rash    Did it involve swelling of the face/tongue/throat, SOB, or low BP? No Did it involve sudden or severe rash/hives, skin peeling, or any reaction on the inside of your mouth or nose? No Did you need to seek medical attention at a hospital or doctor's office? No When did it last happen?      50 years ago If all above answers are "NO", may proceed with cephalosporin use.    Vancomycin Hives and Rash   Zithromax [Azithromycin] Rash    Current Outpatient Medications  Medication Sig Dispense Refill   Calcium-Magnesium-Vitamin D (CITRACAL CALCIUM+D) 600-40-500 MG-MG-UNIT TB24 Take 1 tablet by mouth daily.     ELIQUIS 5 MG TABS tablet TAKE 1 TABLET BY MOUTH TWICE A DAY 60 tablet 6   Leuprolide Acetate, 4 Month, (ELIGARD) 30 MG injection Inject 30 mg into the skin every 4 (four) months. Per pt done at cancer center     No current facility-administered medications for this visit.    Family History Family History  Problem Relation Age of Onset   Hyperlipidemia Mother    Cancer Mother        peritoneal cancer   Hemochromatosis Father        s/p liver transplant   Prostate cancer Maternal Uncle    Prostate cancer Maternal Uncle    Stroke Neg Hx    Diabetes Neg Hx    CAD Neg Hx       Social History Social History   Tobacco Use   Smoking status: Never    Passive exposure: Never   Smokeless tobacco: Never  Vaping Use   Vaping status: Never Used  Substance Use Topics   Alcohol use: Yes    Alcohol/week: 8.0 standard drinks of alcohol    Types: 7 Cans of beer, 1 Shots of liquor per week    Comment: 11-11-2022  per pt one beer daily and one drink on weekend   Drug use: Never        Review of Systems  Constitutional: Negative.   HENT: Negative.    Eyes: Negative.   Respiratory: Negative.    Cardiovascular: Negative.   Gastrointestinal: Negative.   Genitourinary: Negative.   Skin: Negative.    Neurological: Negative.   Psychiatric/Behavioral: Negative.       Physical Exam Blood pressure (!) 168/91, pulse 60, temperature 98.5 F (36.9 C), temperature source Oral, height 5\' 11"  (1.803 m), weight 195 lb (88.5 kg), SpO2 99%. Last Weight  Most recent update: 07/07/2023 11:32 AM    Weight  88.5 kg (195 lb)             CONSTITUTIONAL: Well developed, and nourished, appropriately responsive and aware without distress.   EYES: Sclera non-icteric.   EARS, NOSE, MOUTH AND THROAT:  The oropharynx is clear. Oral mucosa is pink and  moist.   Hearing is intact to voice.  NECK: Trachea is midline, and there is no jugular venous distension.  LYMPH NODES:  Lymph nodes in the neck are not appreciated. RESPIRATORY:  Lungs are clear, and breath sounds are equal bilaterally.  Normal respiratory effort without pathologic use of accessory muscles. CARDIOVASCULAR: Heart is regular in rate and rhythm.   Well perfused.  GI: The abdomen is a 1.5, not more than 2 cm umbilical fascial defect with preperitoneal fat protruding through it.  Soft, nontender, and nondistended. There were no palpable masses.  I did not appreciate hepatosplenomegaly.  MUSCULOSKELETAL:  Symmetrical muscle tone appreciated in all four extremities.    SKIN: Skin turgor is normal. No pathologic skin lesions appreciated.  NEUROLOGIC:  Motor and sensation appear grossly normal.  Cranial nerves are grossly without defect. PSYCH:  Alert and oriented to person, place and time. Affect is appropriate for situation.  Data Reviewed I have personally reviewed what is currently available of the patient's imaging, recent labs and medical records.   Labs:     Latest Ref Rng & Units 07/06/2023    7:38 AM 12/21/2022    8:52 AM 11/16/2022    7:55 AM  CBC  WBC 4.0 - 10.5 K/uL 3.8  4.2    Hemoglobin 13.0 - 17.0 g/dL 62.9  52.8  41.3   Hematocrit 39.0 - 52.0 % 40.1  37.4  38.0   Platelets 150.0 - 400.0 K/uL 195.0  148        Latest Ref Rng  & Units 07/06/2023    7:38 AM 12/21/2022    8:52 AM 11/16/2022    7:55 AM  CMP  Glucose 70 - 99 mg/dL 244  88  010   BUN 6 - 23 mg/dL 19  17  17    Creatinine 0.40 - 1.50 mg/dL 2.72  5.36  6.44   Sodium 135 - 145 mEq/L 139  141  141   Potassium 3.5 - 5.1 mEq/L 4.4  3.7  3.9   Chloride 96 - 112 mEq/L 105  106  104   CO2 19 - 32 mEq/L 27  29    Calcium 8.4 - 10.5 mg/dL 9.7  9.9    Total Protein 6.0 - 8.3 g/dL 6.8  6.7    Total Bilirubin 0.2 - 1.2 mg/dL 0.7  0.7    Alkaline Phos 39 - 117 U/L 46  42    AST 0 - 37 U/L 33  24    ALT 0 - 53 U/L 38  21        Imaging: Radiological images reviewed:   Within last 24 hrs: No results found.  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) 02/16/2022   COVID-19 virus infection 10/27/2021   Hypertension 10/27/2021   Loss of peripheral visual field, bilateral 07/16/2021   Vision loss of right eye 06/30/2021   History of pulmonary embolism 06/24/2021   Atherosclerosis of aorta (HCC) 05/06/2021   CAD (coronary artery disease) 05/06/2021   History of DVT of lower extremity 02/25/2020   Hydronephrosis 02/13/2020   Recurrent UTI 12/31/2019  Malignant neoplasm of prostate (HCC) 08/17/2019   HLD (hyperlipidemia) 08/02/2018   Health maintenance examination 08/05/2017   Anosmia 08/05/2017   Family history of hemochromatosis 08/03/2016   Medicare annual wellness visit, subsequent 07/29/2015   Advanced care planning/counseling discussion 07/29/2015   Bradycardia 07/29/2015   History of cardiac monitoring 07/29/2015   Syncopal  episodes     Plan    We discussed various approaches to umbilical hernia repair.  I believe he will do best with just a direct primary suture repair.  And avoid utilization/potential complications of mesh.  His hernia is small and I expect the recurrence risk is quite acceptable.  Hold Eliquis 2 days pre-op.   I discussed possibility of incarceration, strangulation, enlargement in size over time, and the need for emergency surgery in the face of these.  Also reviewed the techniques of reduction should incarceration occur, and when unsuccessful to present to the ED.  Also discussed that surgery risks include recurrence which can be up to 30% in the case of complex hernias, use of prosthetic materials (mesh) and the increased risk of infection and the possible need for re-operation and removal of mesh, possibility of post-op SBO or ileus, and the risks of general anesthetic including heart attack, stroke, sudden death or some reaction to anesthetic medications. The patient, and those present, appear to understand the risks, any and all questions were answered to the patient's satisfaction.  No guarantees were ever expressed or implied.   Face-to-face time spent with the patient and accompanying care providers(if present) was 30 minutes, with more than 50% of the time spent counseling, educating, and coordinating care of the patient.    These notes generated with voice recognition software. I apologize for typographical errors.  Campbell Lerner M.D., FACS 07/07/2023, 3:18 PM

## 2023-07-07 NOTE — H&P (View-Only) (Signed)
Patient ID: Shane [name], male   DOB: 05-16-1951, 72 y.o.   MRN: [id number]  Chief Complaint: Umbilical hernia  History of Present Illness Shane [name] is a 72 y.o. male with with umbilical hernia, small bulge noted about a year ago.  Slight increase in size of the bulge.  Some pain/tenderness with heavy lifting.  He can push the bulge back in with 1 finger and have it come back out within the next degree of effort.  No history of nausea or vomiting or bowel habit changes.  Is currently engaged in harvesting his garden, anticipates some heavy lifting over the next month, feels that he could lay low and better convalesce in August.  He takes a blood thinner, Eliquis for history of recurrent pulmonary emboli.  Past Medical History Past Medical History:  Diagnosis Date   Anticoagulant long-term use    eliquis--- managed by pcp  for recurrent PEs   Cancer involving bladder by direct extension from prostate (HCC) 06/28/2019   Cystoscopy [facility]) - 2 bladder tumors, 1 on anterior prostate planned TURBT/TURP 06/2019 Biopsy - Gleason 5+4=9 prostate cancer [facility] started 07/2019 planned metastatic survey   Family history of hemochromatosis    Family history of peritoneal cancer    Family history of prostate cancer    History of pulmonary embolus (PE) 06/2019   incidental finding on CT when staging done for prostate cancer dx,  acute PEs RML/ RUL treated w/ blood thinner;   recurrent 06-23-2021  RLL ;  recurrent 02-11-2022  this time is on long term use eliquis  (11-11-2022  he had never had blood clots before 2020 and note since 03/ 2023   History of syncope 2006   previousliy followed by cardiologist- dr Graciela Husbands--  recurrent syncope,  implanted loop recorder 10/ 2006,  explanted 03/ 2017 since no syncope for 10 yrs   (11-11-2022  per pt no issues since 2017)   Hypertension    monitor by pcp and pt monitoring at home---  has improved , pcp tood pt off med   Malignant neoplasm prostate [facility]) 06/2019    urologist--  dr herrick/  radiation oncologist--- dr Kathrynn Running---   dx 07/ 2020,  mets to bladder,  Gleason 5+4   Nocturia    Seasonal allergies    Wears glasses       Past Surgical History:  Procedure Laterality Date   CYSTOSCOPY W/ RETROGRADES Bilateral 07/06/2019   Procedure: CYSTOSCOPY WITH RETROGRADE PYELOGRAM, BILATERAL STENT PLACEMENT;  Surgeon: Crist Fat, MD;  Location: [facility];  Service: Urology;  Laterality: Bilateral;   EP IMPLANTABLE DEVICE N/A 02/25/2016   Procedure: Loop Recorder Removal;  Surgeon: Duke Salvia, MD;  Location: [facility] INVASIVE CV LAB;  Service: Cardiovascular;  Laterality: N/A;    EXPLANTED   GOLD SEED IMPLANT N/A 11/16/2022   Procedure: GOLD SEED IMPLANT INTO PROSTATE;  Surgeon: Bjorn Pippin, MD;  Location: [facility];  Service: Urology;  Laterality: N/A;   IR NEPHROSTOMY PLACEMENT LEFT  02/14/2020   IR NEPHROSTOMY PLACEMENT RIGHT  02/14/2020   LIPOMA EXCISION N/A 02/09/2016   Procedure: EXCISION NECK LIPOMA;  Surgeon: Chevis Pretty III, MD;  Location: [facility];  Service: General;  Laterality: N/A;   LOOP RECORDER INSERTION  10/04/2005   @[facility]  by dr Graciela Husbands   TRANSRECTAL ULTRASOUND N/A 11/16/2022   Procedure: TRANSRECTAL ULTRASOUND;  Surgeon: Bjorn Pippin, MD;  Location: [facility];  Service: Urology;  Laterality: N/A;   TRANSURETHRAL RESECTION OF BLADDER  TUMOR N/A 07/06/2019   Procedure: TRANSURETHRAL RESECTION OF BLADDER TUMOR (TURBT);  Surgeon: Crist Fat, MD;  Location: [facility];  Service: Urology;  Laterality: N/A;    Allergies  Allergen Reactions   Effexor Xr [Venlafaxine Hcl Er] Other (See Comments)    Psychiatric symptoms   Hydrocodone Itching and Other (See Comments)    Per pt turns red from chest up, looks like sunburn and starts itching badly   Oxycodone Itching and Other (See Comments)    Per pt turns red from chest up, looks like sunburn and starts itching badly   Bactrim  [Sulfamethoxazole-Trimethoprim] Hives and Rash   Penicillins Swelling and Rash    Did it involve swelling of the face/tongue/throat, SOB, or low BP? No Did it involve sudden or severe rash/hives, skin peeling, or any reaction on the inside of your mouth or nose? No Did you need to seek medical attention at a hospital or doctor's office? No When did it last happen?      50 years ago If all above answers are "NO", may proceed with cephalosporin use.    Vancomycin Hives and Rash   Zithromax [Azithromycin] Rash    Current Outpatient Medications  Medication Sig Dispense Refill   Calcium-Magnesium-Vitamin D (CITRACAL CALCIUM+D) 600-40-500 MG-MG-UNIT TB24 Take 1 tablet by mouth daily.     ELIQUIS 5 MG TABS tablet TAKE 1 TABLET BY MOUTH TWICE A DAY 60 tablet 6   Leuprolide Acetate, 4 Month, (ELIGARD) 30 MG injection Inject 30 mg into the skin every 4 (four) months. Per pt done at cancer center     No current facility-administered medications for this visit.    Family History Family History  Problem Relation Age of Onset   Hyperlipidemia Mother    Cancer Mother        peritoneal cancer   Hemochromatosis Father        s/p liver transplant   Prostate cancer Maternal Uncle    Prostate cancer Maternal Uncle    Stroke Neg Hx    Diabetes Neg Hx    CAD Neg Hx       Social History Social History   Tobacco Use   Smoking status: Never    Passive exposure: Never   Smokeless tobacco: Never  Vaping Use   Vaping status: Never Used  Substance Use Topics   Alcohol use: Yes    Alcohol/week: 8.0 standard drinks of alcohol    Types: 7 Cans of beer, 1 Shots of liquor per week    Comment: 11-11-2022  per pt one beer daily and one drink on weekend   Drug use: Never        Review of Systems  Constitutional: Negative.   HENT: Negative.    Eyes: Negative.   Respiratory: Negative.    Cardiovascular: Negative.   Gastrointestinal: Negative.   Genitourinary: Negative.   Skin: Negative.    Neurological: Negative.   Psychiatric/Behavioral: Negative.       Physical Exam Blood pressure (!) 168/91, pulse 60, temperature 98.5 F (36.9 C), temperature source Oral, height 5\' 11"  (1.803 m), weight 195 lb (88.5 kg), SpO2 99%. Last Weight  Most recent update: 07/07/2023 11:32 AM    Weight  88.5 kg (195 lb)             CONSTITUTIONAL: Well developed, and nourished, appropriately responsive and aware without distress.   EYES: Sclera non-icteric.   EARS, NOSE, MOUTH AND THROAT:  The oropharynx is clear. Oral mucosa is pink and  moist.   Hearing is intact to voice.  NECK: Trachea is midline, and there is no jugular venous distension.  LYMPH NODES:  Lymph nodes in the neck are not appreciated. RESPIRATORY:  Lungs are clear, and breath sounds are equal bilaterally.  Normal respiratory effort without pathologic use of accessory muscles. CARDIOVASCULAR: Heart is regular in rate and rhythm.   Well perfused.  GI: The abdomen is a 1.5, not more than 2 cm umbilical fascial defect with preperitoneal fat protruding through it.  Soft, nontender, and nondistended. There were no palpable masses.  I did not appreciate hepatosplenomegaly.  MUSCULOSKELETAL:  Symmetrical muscle tone appreciated in all four extremities.    SKIN: Skin turgor is normal. No pathologic skin lesions appreciated.  NEUROLOGIC:  Motor and sensation appear grossly normal.  Cranial nerves are grossly without defect. PSYCH:  Alert and oriented to person, place and time. Affect is appropriate for situation.  Data Reviewed I have personally reviewed what is currently available of the patient's imaging, recent labs and medical records.   Labs:     Latest Ref Rng & Units 07/06/2023    7:38 AM 12/21/2022    8:52 AM 11/16/2022    7:55 AM  CBC  WBC 4.0 - 10.5 K/uL 3.8  4.2    Hemoglobin 13.0 - 17.0 g/dL 62.9  52.8  41.3   Hematocrit 39.0 - 52.0 % 40.1  37.4  38.0   Platelets 150.0 - 400.0 K/uL 195.0  148        Latest Ref Rng  & Units 07/06/2023    7:38 AM 12/21/2022    8:52 AM 11/16/2022    7:55 AM  CMP  Glucose 70 - 99 mg/dL 244  88  010   BUN 6 - 23 mg/dL 19  17  17    Creatinine 0.40 - 1.50 mg/dL 2.72  5.36  6.44   Sodium 135 - 145 mEq/L 139  141  141   Potassium 3.5 - 5.1 mEq/L 4.4  3.7  3.9   Chloride 96 - 112 mEq/L 105  106  104   CO2 19 - 32 mEq/L 27  29    Calcium 8.4 - 10.5 mg/dL 9.7  9.9    Total Protein 6.0 - 8.3 g/dL 6.8  6.7    Total Bilirubin 0.2 - 1.2 mg/dL 0.7  0.7    Alkaline Phos 39 - 117 U/L 46  42    AST 0 - 37 U/L 33  24    ALT 0 - 53 U/L 38  21        Imaging: Radiological images reviewed:   Within last 24 hrs: No results found.  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) 02/16/2022   COVID-19 virus infection 10/27/2021   Hypertension 10/27/2021   Loss of peripheral visual field, bilateral 07/16/2021   Vision loss of right eye 06/30/2021   History of pulmonary embolism 06/24/2021   Atherosclerosis of aorta (HCC) 05/06/2021   CAD (coronary artery disease) 05/06/2021   History of DVT of lower extremity 02/25/2020   Hydronephrosis 02/13/2020   Recurrent UTI 12/31/2019  Malignant neoplasm of prostate (HCC) 08/17/2019   HLD (hyperlipidemia) 08/02/2018   Health maintenance examination 08/05/2017   Anosmia 08/05/2017   Family history of hemochromatosis 08/03/2016   Medicare annual wellness visit, subsequent 07/29/2015   Advanced care planning/counseling discussion 07/29/2015   Bradycardia 07/29/2015   History of cardiac monitoring 07/29/2015   Syncopal  episodes     Plan    We discussed various approaches to umbilical hernia repair.  I believe he will do best with just a direct primary suture repair.  And avoid utilization/potential complications of mesh.  His hernia is small and I expect the recurrence risk is quite acceptable.  Hold Eliquis 2 days pre-op.   I discussed possibility of incarceration, strangulation, enlargement in size over time, and the need for emergency surgery in the face of these.  Also reviewed the techniques of reduction should incarceration occur, and when unsuccessful to present to the ED.  Also discussed that surgery risks include recurrence which can be up to 30% in the case of complex hernias, use of prosthetic materials (mesh) and the increased risk of infection and the possible need for re-operation and removal of mesh, possibility of post-op SBO or ileus, and the risks of general anesthetic including heart attack, stroke, sudden death or some reaction to anesthetic medications. The patient, and those present, appear to understand the risks, any and all questions were answered to the patient's satisfaction.  No guarantees were ever expressed or implied.   Face-to-face time spent with the patient and accompanying care providers(if present) was 30 minutes, with more than 50% of the time spent counseling, educating, and coordinating care of the patient.    These notes generated with voice recognition software. I apologize for typographical errors.  Campbell Lerner M.D., FACS 07/07/2023, 3:18 PM

## 2023-07-07 NOTE — Patient Instructions (Addendum)
Our surgery scheduler Britta Mccreedy will call you within 24-48 hours to get you scheduled. If you have not heard from her after 48 hours, please call our office. Have the blue sheet available when she calls to write down important information.   Umbilical Hernia, Adult  A hernia is a lump of tissue that pushes through an opening in the muscles. An umbilical hernia happens in the belly, near the belly button. The hernia may contain tissues from the small or large intestine. It may also have fatty tissue that covers the intestines. Umbilical hernias in adults may get worse over time. They need to be treated with surgery. There are several types of umbilical hernias. They include: Indirect hernia. This occurs just above or below the belly button. It's the most common type of umbilical hernia in adults. Direct hernia. This type occurs in an opening that's formed by the belly button. Reducible hernia. This hernia comes and goes. You may see it only when you strain, cough, or lift something heavy. This type of hernia can be pushed back into the belly (reduced). Incarcerated hernia. This traps the hernia in the wall of the belly. This type of hernia can't be pushed back into the belly. It can cause a strangulated hernia. Strangulated hernia. This hernia cuts off blood flow to the tissues inside the hernia. The tissues can die if this happens. This type of hernia must be treated right away. What are the causes? An umbilical hernia happens when tissue inside the belly pushes through an opening in the muscles of the belly. What increases the risk? You're more likely to get this hernia if: You strain while lifting or pushing heavy objects. You've had several pregnancies. You have a condition that puts pressure on your belly, and you've had it for a long time. These include: Obesity. A buildup of fluid inside your belly. Vomiting or coughing all the time. Trouble pooping (constipation). You've had surgery that  weakened the muscles in the belly. What are the signs or symptoms? The main symptom of this condition is a bulge at the belly button or near it. The bulge does not cause pain. Other symptoms depend on the type of hernia you have. A reducible hernia may be seen only when you strain, cough, or lift something heavy. Other symptoms may include: Dull pain. A feeling of pressure. An incarcerated hernia may cause very bad pain. Also, you may: Vomit or feel like you may vomit. Not be able to pass gas. A strangulated hernia may cause: Pain that gets worse and worse. Vomiting, or feeling like you may vomit. Pain when you press on the hernia. Change of color on the skin over the hernia. The skin may become red or purple. Trouble pooping. Blood in the poop. How is this diagnosed? This condition may be diagnosed based on: Your symptoms and medical history. A physical exam. You may be asked to cough or strain while standing. These actions will put pressure inside your belly. The pressure can force the hernia through the opening in your muscles. Your health care provider may try to push the hernia back into your belly (reduce). How is this treated? Surgery is the only treatment for an umbilical hernia. Surgery for a strangulated hernia must be done right away. If you have a small hernia that's not incarcerated, you may need to lose weight before the surgery is done. Follow these instructions at home: Managing constipation You may need to take these actions to prevent trouble pooping. This  will help to prevent straining. Drink enough fluid to keep your pee (urine) pale yellow. Take over-the-counter or prescription medicines. Eat foods that are high in fiber, such as beans, whole grains, and fresh fruits and vegetables. Limit foods that are high in fat and sugars, such as fried or sweet foods. General instructions Do not try to push the hernia back in. Lose weight, if told by your provider. Watch  your hernia for any changes in color or size. Tell your provider if any changes occur. You may need to avoid activities that put pressure on your hernia. You may have to avoid lifting. Ask your provider how much you can safely lift. Take over-the-counter and prescription medicines only as told by your provider. Contact a health care provider if: Your hernia gets larger or feels hard. Your hernia becomes painful. You get a fever or chills. Get help right away if: You get very bad pain near the area of the hernia, and the pain comes on suddenly. You have pain and you vomit or feel like you may vomit. The skin over your hernia changes color. These symptoms may be an emergency. Get help right away. Call 911. Do not wait to see if the symptoms go away. Do not drive yourself to the hospital. This information is not intended to replace advice given to you by your health care provider. Make sure you discuss any questions you have with your health care provider. Document Revised: 03/15/2023 Document Reviewed: 03/15/2023 Elsevier Patient Education  2024 [employer].

## 2023-07-08 ENCOUNTER — Telehealth: Payer: Self-pay | Admitting: Surgery

## 2023-07-08 NOTE — Telephone Encounter (Signed)
Patient calls back, he is informed of all dates regarding surgery.   

## 2023-07-08 NOTE — Telephone Encounter (Signed)
Left message for patient to call, please inform him of the following regarding scheduled surgery with Dr. Claudine Mouton.   Pre-Admission date/time, and Surgery date at [facility].  Surgery Date: 07/13/23 Preadmission Testing Date: 07/11/23 (phone 8a-1p)  Also patient will need to call at [phone], between 1-3:00pm the day before surgery, to find out what time to arrive for surgery.

## 2023-07-11 ENCOUNTER — Encounter: Admission: RE | Admit: 2023-07-11 | Payer: PPO | Source: Ambulatory Visit

## 2023-07-11 ENCOUNTER — Encounter
Admission: RE | Admit: 2023-07-11 | Discharge: 2023-07-11 | Disposition: A | Discharge status: Home / Self Care | Payer: PPO | Source: Ambulatory Visit | Attending: Surgery | Admitting: Surgery

## 2023-07-11 ENCOUNTER — Other Ambulatory Visit: Payer: Self-pay

## 2023-07-11 ENCOUNTER — Encounter: Payer: Self-pay | Admitting: Surgery

## 2023-07-11 DIAGNOSIS — I10 Essential (primary) hypertension: Secondary | ICD-10-CM

## 2023-07-11 DIAGNOSIS — R55 Syncope and collapse: Secondary | ICD-10-CM

## 2023-07-11 DIAGNOSIS — E7800 Pure hypercholesterolemia, unspecified: Secondary | ICD-10-CM | POA: Diagnosis not present

## 2023-07-11 DIAGNOSIS — I2510 Atherosclerotic heart disease of native coronary artery without angina pectoris: Secondary | ICD-10-CM | POA: Diagnosis not present

## 2023-07-11 DIAGNOSIS — Z01810 Encounter for preprocedural cardiovascular examination: Secondary | ICD-10-CM | POA: Diagnosis not present

## 2023-07-11 DIAGNOSIS — Z7901 Long term (current) use of anticoagulants: Secondary | ICD-10-CM

## 2023-07-11 DIAGNOSIS — I700 Atherosclerosis of aorta: Secondary | ICD-10-CM | POA: Diagnosis not present

## 2023-07-11 DIAGNOSIS — Z86718 Personal history of other venous thrombosis and embolism: Secondary | ICD-10-CM

## 2023-07-11 NOTE — Patient Instructions (Signed)
Your procedure is scheduled on: Wednesday 07/13/23 To find out your arrival time, please call [phone] between 1PM - 3PM on:   Tuesday 07/12/23 Report to the Registration Desk on the 1st floor of the [facility]. Free Valet parking is available.  If your arrival time is 6:00 am, do not arrive before that time as the [facility] entrance doors do not open until 6:00 am.  REMEMBER: Instructions that are not followed completely may result in serious medical risk, up to and including death; or upon the discretion of your surgeon and anesthesiologist your surgery may need to be rescheduled.  Do not eat food or drink any liquids after midnight the night before surgery.  No gum chewing or hard candies.  One week prior to surgery: Stop Anti-inflammatories (NSAIDS) such as Advil, Aleve, Ibuprofen, Motrin, Naproxen, Naprosyn and Aspirin based products such as Excedrin, Goody's Powder, BC Powder. You may however, continue to take Tylenol if needed for pain up until the day of surgery.  Stop ANY OVER THE COUNTER supplements or vitamins until after surgery.  Continue taking all prescribed medications with the exception of the following: Hold Eliquis as instructed.  TAKE ONLY THESE MEDICATIONS THE MORNING OF SURGERY WITH A SIP OF WATER:  none  No Alcohol for 24 hours before or after surgery.  No Smoking including e-cigarettes for 24 hours before surgery.  No chewable tobacco products for at least 6 hours before surgery.  No nicotine patches on the day of surgery.  Do not use any "recreational" drugs for at least a week (preferably 2 weeks) before your surgery.  Please be advised that the combination of cocaine and anesthesia may have negative outcomes, up to and including death. If you test positive for cocaine, your surgery will be cancelled.  On the morning of surgery brush your teeth with toothpaste and water, you may rinse your mouth with mouthwash if you wish. Do not swallow any  toothpaste or mouthwash.  Use CHG Soap or wipes as directed on instruction sheet.  Do not wear lotions, powders, or perfumes.   Do not shave body hair from the neck down 48 hours before surgery.  Wear comfortable clothing (specific to your surgery type) to the hospital.  Do not wear jewelry, make-up, hairpins, clips or nail polish.  Contact lenses, hearing aids and dentures may not be worn into surgery.  Do not bring valuables to the hospital. [facility] is not responsible for any missing/lost belongings or valuables.   Notify your doctor if there is any change in your medical condition (cold, fever, infection).  If you are being discharged the day of surgery, you will not be allowed to drive home. You will need a responsible individual to drive you home and stay with you for 24 hours after surgery.   If you are taking public transportation, you will need to have a responsible individual with you.  If you are being admitted to the hospital overnight, leave your suitcase in the car. After surgery it may be brought to your room.  In case of increased patient census, it may be necessary for you, the patient, to continue your postoperative care in the Same Day Surgery department.  After surgery, you can help prevent lung complications by doing breathing exercises.  Take deep breaths and cough every 1-2 hours. Your doctor may order a device called an Incentive Spirometer to help you take deep breaths. When coughing or sneezing, hold a pillow firmly against your incision with both  hands. This is called "splinting." Doing this helps protect your incision. It also decreases belly discomfort.  Surgery Visitation Policy:  Patients undergoing a surgery or procedure may have two family members or support persons with them as long as the person is not COVID-19 positive or experiencing its symptoms.   Inpatient Visitation:    Visiting hours are 7 a.m. to 8 p.m. Up to four visitors are  allowed at one time in a patient room. The visitors may rotate out with other people during the day. One designated support person (adult) may remain overnight.  Please call the Pre-admissions Testing Dept. at [phone] if you have any questions about these instructions.     Preparing for Surgery with CHLORHEXIDINE GLUCONATE (CHG) Soap  Chlorhexidine Gluconate (CHG) Soap  o An antiseptic cleaner that kills germs and bonds with the skin to continue killing germs even after washing  o Used for showering the night before surgery and morning of surgery  Before surgery, you can play an important role by reducing the number of germs on your skin.  CHG (Chlorhexidine gluconate) soap is an antiseptic cleanser which kills germs and bonds with the skin to continue killing germs even after washing.  Please do not use if you have an allergy to CHG or antibacterial soaps. If your skin becomes reddened/irritated stop using the CHG.  1. Shower the NIGHT BEFORE SURGERY and the MORNING OF SURGERY with CHG soap.  2. If you choose to wash your hair, wash your hair first as usual with your normal shampoo.  3. After shampooing, rinse your hair and body thoroughly to remove the shampoo.  4. Use CHG as you would any other liquid soap. You can apply CHG directly to the skin and wash gently with a scrungie or a clean washcloth.  5. Apply the CHG soap to your body only from the neck down. Do not use on open wounds or open sores. Avoid contact with your eyes, ears, mouth, and genitals (private parts). Wash face and genitals (private parts) with your normal soap.  6. Wash thoroughly, paying special attention to the area where your surgery will be performed.  7. Thoroughly rinse your body with warm water.  8. Do not shower/wash with your normal soap after using and rinsing off the CHG soap.  9. Pat yourself dry with a clean towel.  10. Wear clean pajamas to bed the night before surgery.  12. Place  clean sheets on your bed the night of your first shower and do not sleep with pets.  13. Shower again with the CHG soap on the day of surgery prior to arriving at the hospital.  14. Do not apply any deodorants/lotions/powders.  15. Please wear clean clothes to the hospital.

## 2023-07-13 ENCOUNTER — Ambulatory Visit: Payer: PPO | Admitting: Anesthesiology

## 2023-07-13 ENCOUNTER — Other Ambulatory Visit: Payer: Self-pay

## 2023-07-13 ENCOUNTER — Encounter
Admission: RE | Disposition: A | Discharge status: Home / Self Care | Payer: Self-pay | Source: Home / Self Care | Attending: Surgery

## 2023-07-13 ENCOUNTER — Ambulatory Visit
Admission: RE | Admit: 2023-07-13 | Discharge: 2023-07-13 | Disposition: A | Discharge status: Home / Self Care | Payer: PPO | Attending: Surgery | Admitting: Surgery

## 2023-07-13 ENCOUNTER — Encounter: Payer: Self-pay | Admitting: Surgery

## 2023-07-13 ENCOUNTER — Encounter: Payer: PPO | Admitting: Family Medicine

## 2023-07-13 ENCOUNTER — Ambulatory Visit: Payer: Self-pay | Admitting: Urgent Care

## 2023-07-13 DIAGNOSIS — Z7901 Long term (current) use of anticoagulants: Secondary | ICD-10-CM | POA: Insufficient documentation

## 2023-07-13 DIAGNOSIS — I2510 Atherosclerotic heart disease of native coronary artery without angina pectoris: Secondary | ICD-10-CM | POA: Insufficient documentation

## 2023-07-13 DIAGNOSIS — Z86711 Personal history of pulmonary embolism: Secondary | ICD-10-CM

## 2023-07-13 DIAGNOSIS — I10 Essential (primary) hypertension: Secondary | ICD-10-CM | POA: Insufficient documentation

## 2023-07-13 DIAGNOSIS — Z86718 Personal history of other venous thrombosis and embolism: Secondary | ICD-10-CM | POA: Diagnosis not present

## 2023-07-13 DIAGNOSIS — I700 Atherosclerosis of aorta: Secondary | ICD-10-CM

## 2023-07-13 DIAGNOSIS — R55 Syncope and collapse: Secondary | ICD-10-CM

## 2023-07-13 DIAGNOSIS — E7800 Pure hypercholesterolemia, unspecified: Secondary | ICD-10-CM

## 2023-07-13 DIAGNOSIS — K429 Umbilical hernia without obstruction or gangrene: Secondary | ICD-10-CM | POA: Diagnosis not present

## 2023-07-13 HISTORY — PX: UMBILICAL HERNIA REPAIR: SHX196

## 2023-07-13 SURGERY — REPAIR, HERNIA, UMBILICAL, ADULT
Anesthesia: General | Site: Abdomen

## 2023-07-13 MED ORDER — CHLORHEXIDINE GLUCONATE CLOTH 2 % EX PADS: 6.0000 | MEDICATED_PAD | Freq: Once | CUTANEOUS | Status: DC

## 2023-07-13 MED ORDER — BUPIVACAINE HCL (PF) 0.25 % IJ SOLN
INTRAMUSCULAR | Status: AC
  Filled 2023-07-13: qty 30

## 2023-07-13 MED ORDER — BUPIVACAINE-EPINEPHRINE (PF) 0.25% -1:200000 IJ SOLN
INTRAMUSCULAR | Status: AC
  Filled 2023-07-13: qty 30

## 2023-07-13 MED ORDER — ONDANSETRON HCL 4 MG/2ML IJ SOLN
INTRAMUSCULAR | Status: DC | PRN
Start: 2023-07-13 — End: 2023-07-13
  Administered 2023-07-13: 4 mg via INTRAVENOUS

## 2023-07-13 MED ORDER — BUPIVACAINE-EPINEPHRINE 0.25% -1:200000 IJ SOLN
INTRAMUSCULAR | Status: DC | PRN
  Administered 2023-07-13: 30 mL

## 2023-07-13 MED ORDER — FAMOTIDINE 20 MG PO TABS
ORAL_TABLET | ORAL | Status: AC
  Filled 2023-07-13: qty 1

## 2023-07-13 MED ORDER — CHLORHEXIDINE GLUCONATE 0.12 % MT SOLN
OROMUCOSAL | Status: AC
  Filled 2023-07-13: qty 15

## 2023-07-13 MED ORDER — CHLORHEXIDINE GLUCONATE 0.12 % MT SOLN
15.0000 mL | Freq: Once | OROMUCOSAL | Status: AC
  Administered 2023-07-13: 15 mL via OROMUCOSAL

## 2023-07-13 MED ORDER — GABAPENTIN 300 MG PO CAPS
ORAL_CAPSULE | ORAL | Status: AC
  Filled 2023-07-13: qty 1

## 2023-07-13 MED ORDER — 0.9 % SODIUM CHLORIDE (POUR BTL) OPTIME
TOPICAL | Status: DC | PRN
  Administered 2023-07-13: 500 mL

## 2023-07-13 MED ORDER — FENTANYL CITRATE (PF) 100 MCG/2ML IJ SOLN
INTRAMUSCULAR | Status: DC | PRN
  Administered 2023-07-13 (×2): 50 ug via INTRAVENOUS

## 2023-07-13 MED ORDER — TRAMADOL HCL 50 MG PO TABS
ORAL_TABLET | ORAL | Status: AC
  Filled 2023-07-13: qty 1

## 2023-07-13 MED ORDER — LACTATED RINGERS IV SOLN: INTRAVENOUS | Status: DC

## 2023-07-13 MED ORDER — FAMOTIDINE 20 MG PO TABS
20.0000 mg | ORAL_TABLET | Freq: Once | ORAL | Status: AC
  Administered 2023-07-13: 20 mg via ORAL

## 2023-07-13 MED ORDER — LIDOCAINE HCL (CARDIAC) PF 100 MG/5ML IV SOSY
PREFILLED_SYRINGE | INTRAVENOUS | Status: DC | PRN
  Administered 2023-07-13: 60 mg via INTRATRACHEAL

## 2023-07-13 MED ORDER — ACETAMINOPHEN 500 MG PO TABS
ORAL_TABLET | ORAL | Status: AC
  Filled 2023-07-13: qty 2

## 2023-07-13 MED ORDER — TRAMADOL HCL 50 MG PO TABS
50.0000 mg | ORAL_TABLET | Freq: Once | ORAL | Status: AC
  Administered 2023-07-13: 50 mg via ORAL

## 2023-07-13 MED ORDER — ACETAMINOPHEN 500 MG PO TABS: 1000.0000 mg | ORAL_TABLET | ORAL | Status: DC

## 2023-07-13 MED ORDER — GABAPENTIN 300 MG PO CAPS: 300.0000 mg | ORAL_CAPSULE | ORAL | Status: DC

## 2023-07-13 MED ORDER — ORAL CARE MOUTH RINSE: 15.0000 mL | Freq: Once | OROMUCOSAL | Status: AC

## 2023-07-13 MED ORDER — MIDAZOLAM HCL 5 MG/5ML IJ SOLN
INTRAMUSCULAR | Status: DC | PRN
  Administered 2023-07-13: 2 mg via INTRAVENOUS

## 2023-07-13 MED ORDER — FENTANYL CITRATE (PF) 100 MCG/2ML IJ SOLN
INTRAMUSCULAR | Status: AC
  Filled 2023-07-13: qty 2

## 2023-07-13 MED ORDER — IBUPROFEN 800 MG PO TABS: 800.0000 mg | ORAL_TABLET | Freq: Three times a day (TID) | ORAL | Status: DC | PRN

## 2023-07-13 MED ORDER — BUPIVACAINE LIPOSOME 1.3 % IJ SUSP: 20.0000 mL | Freq: Once | INTRAMUSCULAR | Status: DC

## 2023-07-13 MED ORDER — CIPROFLOXACIN IN D5W 400 MG/200ML IV SOLN
INTRAVENOUS | Status: AC
  Filled 2023-07-13: qty 200

## 2023-07-13 MED ORDER — CIPROFLOXACIN IN D5W 400 MG/200ML IV SOLN
400.0000 mg | Freq: Once | INTRAVENOUS | Status: AC
  Administered 2023-07-13: 400 mg via INTRAVENOUS

## 2023-07-13 MED ORDER — GABAPENTIN 300 MG PO CAPS
300.0000 mg | ORAL_CAPSULE | Freq: Once | ORAL | Status: AC
  Administered 2023-07-13: 300 mg via ORAL

## 2023-07-13 MED ORDER — MIDAZOLAM HCL 2 MG/2ML IJ SOLN
INTRAMUSCULAR | Status: AC
  Filled 2023-07-13: qty 2

## 2023-07-13 MED ORDER — DEXAMETHASONE SODIUM PHOSPHATE 4 MG/ML IJ SOLN
INTRAMUSCULAR | Status: DC | PRN
  Administered 2023-07-13: 10 mg via INTRAVENOUS

## 2023-07-13 MED ORDER — PROPOFOL 10 MG/ML IV BOLUS
INTRAVENOUS | Status: DC | PRN
  Administered 2023-07-13: 150 mg via INTRAVENOUS
  Administered 2023-07-13: 100 ug/kg/min via INTRAVENOUS

## 2023-07-13 MED ORDER — ACETAMINOPHEN 500 MG PO TABS
1000.0000 mg | ORAL_TABLET | Freq: Once | ORAL | Status: AC
  Administered 2023-07-13: 1000 mg via ORAL

## 2023-07-13 SURGICAL SUPPLY — 33 items
ADH SKN CLS APL DERMABOND .7 (GAUZE/BANDAGES/DRESSINGS) ×1
APL PRP STRL LF DISP 70% ISPRP (MISCELLANEOUS) ×1
BLADE CLIPPER SURG (BLADE) IMPLANT
BLADE SURG 15 STRL LF DISP TIS (BLADE) ×1 IMPLANT
BLADE SURG 15 STRL SS (BLADE) ×1
CHLORAPREP W/TINT 26 (MISCELLANEOUS) ×1 IMPLANT
DERMABOND ADVANCED .7 DNX12 (GAUZE/BANDAGES/DRESSINGS) ×1 IMPLANT
DRAPE LAPAROTOMY 77X122 PED (DRAPES) ×1 IMPLANT
ELECT CAUTERY BLADE 6.4 (BLADE) ×1 IMPLANT
ELECT REM PT RETURN 9FT ADLT (ELECTROSURGICAL) ×1
ELECTRODE REM PT RTRN 9FT ADLT (ELECTROSURGICAL) ×1 IMPLANT
GAUZE 4X4 16PLY ~~LOC~~+RFID DBL (SPONGE) ×1 IMPLANT
GLOVE ORTHO TXT STRL SZ7.5 (GLOVE) ×1 IMPLANT
GOWN STRL REUS W/ TWL LRG LVL3 (GOWN DISPOSABLE) ×1 IMPLANT
GOWN STRL REUS W/ TWL XL LVL3 (GOWN DISPOSABLE) ×1 IMPLANT
GOWN STRL REUS W/TWL LRG LVL3 (GOWN DISPOSABLE) ×2
GOWN STRL REUS W/TWL XL LVL3 (GOWN DISPOSABLE) ×1
KIT TURNOVER KIT A (KITS) ×1 IMPLANT
MANIFOLD NEPTUNE II (INSTRUMENTS) ×1 IMPLANT
NDL HYPO 22X1.5 SAFETY MO (MISCELLANEOUS) ×1 IMPLANT
NEEDLE HYPO 22X1.5 SAFETY MO (MISCELLANEOUS) ×1
NS IRRIG 500ML POUR BTL (IV SOLUTION) ×1 IMPLANT
PACK BASIN MINOR ARMC (MISCELLANEOUS) ×1 IMPLANT
SPIKE FLUID TRANSFER (MISCELLANEOUS) ×1 IMPLANT
SUT ETHIBOND 0 MO6 C/R (SUTURE) ×1 IMPLANT
SUT MNCRL 4-0 (SUTURE) ×1
SUT MNCRL 4-0 27XMFL (SUTURE) ×1
SUT VIC AB 3-0 SH 27 (SUTURE) ×1
SUT VIC AB 3-0 SH 27X BRD (SUTURE) ×1 IMPLANT
SUTURE MNCRL 4-0 27XMF (SUTURE) ×1 IMPLANT
SYR 10ML LL (SYRINGE) ×1 IMPLANT
TRAP FLUID SMOKE EVACUATOR (MISCELLANEOUS) ×1 IMPLANT
WATER STERILE IRR 500ML POUR (IV SOLUTION) ×1 IMPLANT

## 2023-07-13 NOTE — Interval H&P Note (Signed)
History and Physical Interval Note:  07/13/2023 2:22 PM  Shane [name]  has presented today for surgery, with the diagnosis of umbilical hernia initial reducible.  The various methods of treatment have been discussed with the patient and family. After consideration of risks, benefits and other options for treatment, the patient has consented to  Procedure(s): HERNIA REPAIR UMBILICAL ADULT, open (N/A) as a surgical intervention.  The patient's history has been reviewed, patient examined, no change in status, stable for surgery.  I have reviewed the patient's chart and labs.  Questions were answered to the patient's satisfaction.     Campbell Lerner

## 2023-07-13 NOTE — Anesthesia Procedure Notes (Signed)
Procedure Name: Intubation Date/Time: 07/13/2023 2:42 PM  Performed by: Lysbeth Penner, CRNAPre-anesthesia Checklist: Patient identified, Emergency Drugs available, Suction available and Patient being monitored Patient Re-evaluated:Patient Re-evaluated prior to induction Oxygen Delivery Method: Circle system utilized Preoxygenation: Pre-oxygenation with 100% oxygen Induction Type: IV induction LMA: LMA inserted LMA Size: 4.0 Number of attempts: 1 Airway Equipment and Method: Oral airway Placement Confirmation: positive ETCO2 and breath sounds checked- equal and bilateral Tube secured with: Tape Dental Injury: Teeth and Oropharynx as per pre-operative assessment

## 2023-07-13 NOTE — Anesthesia Postprocedure Evaluation (Signed)
Anesthesia Post Note  Patient: Shane [name]  Procedure(s) Performed: HERNIA REPAIR UMBILICAL ADULT, open (Abdomen)  Anesthesia Type: General Anesthetic complications: no   There were no known notable events for this encounter.   Last Vitals:  Vitals:   07/13/23 1339  BP: (!) 158/80  Pulse: (!) 58  Resp: 18  Temp: 37.1 C  SpO2: 99%    Last Pain:  Vitals:   07/13/23 1339  TempSrc: Tympanic  PainSc: 0-No pain                  R 

## 2023-07-13 NOTE — Discharge Instructions (Addendum)
AMBULATORY SURGERY  DISCHARGE INSTRUCTIONS   The drugs that you were given will stay in your system until tomorrow so for the next 24 hours you should not:  Drive an automobile Make any legal decisions Drink any alcoholic beverage   You may resume regular meals tomorrow.  Today it is better to start with liquids and gradually work up to solid foods.  You may eat anything you prefer, but it is better to start with liquids, then soup and crackers, and gradually work up to solid foods.   Please notify your doctor immediately if you have any unusual bleeding, trouble breathing, redness and pain at the surgery site, drainage, fever, or pain not relieved by medication.    Additional Instructions: May resume Eliquis on 8/9 for PM dose        Please contact your physician with any problems or Same Day Surgery at [phone], Monday through Friday 6 am to 4 pm, or [place] [facility] number at [phone].

## 2023-07-13 NOTE — Transfer of Care (Signed)
Immediate Anesthesia Transfer of Care Note  Patient: Shane [name]  Procedure(s) Performed: HERNIA REPAIR UMBILICAL ADULT, open (Abdomen)  Patient Location: PACU  Anesthesia Type: General  Level of Consciousness: awake, alert  and patient cooperative  Airway and Oxygen Therapy: Patient Spontanous Breathing and Patient connected to supplemental oxygen  Post-op Assessment: Post-op Vital signs reviewed, Patient's Cardiovascular Status Stable, Respiratory Function Stable, Patent Airway and No signs of Nausea or vomiting  Post-op Vital Signs: Reviewed and stable  Complications: There were no known notable events for this encounter.

## 2023-07-13 NOTE — Anesthesia Preprocedure Evaluation (Addendum)
Anesthesia Evaluation  Patient identified by MRN, date of birth, ID band Patient awake    Reviewed: Allergy & Precautions, NPO status , Patient's Chart, lab work & pertinent test results  History of Anesthesia Complications Negative for: history of anesthetic complications  Airway Mallampati: III  TM Distance: >3 FB Neck ROM: full    Dental  (+) Chipped   Pulmonary neg pulmonary ROS, neg shortness of breath   Pulmonary exam normal        Cardiovascular Exercise Tolerance: Good hypertension, (-) angina + CAD  Normal cardiovascular exam     Neuro/Psych  PSYCHIATRIC DISORDERS      negative neurological ROS     GI/Hepatic negative GI ROS, Neg liver ROS,neg GERD  ,,  Endo/Other  negative endocrine ROS    Renal/GU Renal disease     Musculoskeletal   Abdominal   Peds  Hematology negative hematology ROS (+)   Anesthesia Other Findings Past Medical History: No date: Anticoagulant long-term use     Comment:  eliquis--- managed by pcp  for recurrent PEs 06/28/2019: Cancer involving bladder by direct extension from  prostate [facility])     Comment:  Cystoscopy [facility]) - 2 bladder tumors, 1 on anterior               prostate planned TURBT/TURP 06/2019 Biopsy - Gleason 5+4=9              prostate cancer [facility] started 07/2019 planned               metastatic survey No date: Family history of hemochromatosis     Comment:  son No date: Family history of peritoneal cancer No date: Family history of prostate cancer 06/2019: History of pulmonary embolus (PE)     Comment:  incidental finding on CT when staging done for prostate               cancer dx,  acute PEs RML/ RUL treated w/ blood thinner;               recurrent 06-23-2021  RLL ;  recurrent 02-11-2022  this               time is on long term use eliquis  (11-11-2022  he had               never had blood clots before 2020 and note since 03/ 2023 2006: History of  syncope     Comment:  previousliy followed by cardiologist- dr Graciela Husbands--                recurrent syncope,  implanted loop recorder 10/ 2006,                explanted 03/ 2017 since no syncope for 10 yrs                 (11-11-2022  per pt no issues since 2017) No date: Hypertension     Comment:  monitor by pcp and pt monitoring at home---  has               improved , pcp tood pt off med 06/2019: Malignant neoplasm prostate [facility])     Comment:  urologist--  dr herrick/  radiation oncologist--- dr               Kathrynn Running---   dx 07/ 2020,  mets to bladder,  Gleason 5+4 No date: Nocturia No date: Seasonal allergies No date: Wears glasses  Past Surgical History: 07/06/2019: CYSTOSCOPY W/ RETROGRADES; Bilateral     Comment:  Procedure: CYSTOSCOPY WITH RETROGRADE PYELOGRAM,               BILATERAL STENT PLACEMENT;  Surgeon: Crist Fat,              MD;  Location: [facility] ORS;  Service: Urology;  Laterality:               Bilateral; 02/25/2016: EP IMPLANTABLE DEVICE; N/A     Comment:  Procedure: Loop Recorder Removal;  Surgeon: Duke Salvia, MD;  Location: [facility] INVASIVE CV LAB;  Service:               Cardiovascular;  Laterality: N/A;    EXPLANTED 11/16/2022: GOLD SEED IMPLANT; N/A     Comment:  Procedure: GOLD SEED IMPLANT INTO PROSTATE;  Surgeon:               Bjorn Pippin, MD;  Location: [facility];                Service: Urology;  Laterality: N/A; 02/14/2020: IR NEPHROSTOMY PLACEMENT LEFT 02/14/2020: IR NEPHROSTOMY PLACEMENT RIGHT 02/09/2016: LIPOMA EXCISION; N/A     Comment:  Procedure: EXCISION NECK LIPOMA;  Surgeon: Chevis Pretty               III, MD;  Location: [facility];  Service:               General;  Laterality: N/A; 10/04/2005: LOOP RECORDER INSERTION     Comment:  @[facility]  by dr Graciela Husbands 11/16/2022: TRANSRECTAL ULTRASOUND; N/A     Comment:  Procedure: TRANSRECTAL ULTRASOUND;  Surgeon: Bjorn Pippin, MD;  Location: [facility];                Service: Urology;  Laterality: N/A; 07/06/2019: TRANSURETHRAL RESECTION OF BLADDER TUMOR; N/A     Comment:  Procedure: TRANSURETHRAL RESECTION OF BLADDER TUMOR               (TURBT);  Surgeon: Crist Fat, MD;  Location: [facility]              ORS;  Service: Urology;  Laterality: N/A;  BMI    Body Mass Index: 27.20 kg/m      Reproductive/Obstetrics negative OB ROS                             Anesthesia Physical Anesthesia Plan  ASA: 3  Anesthesia Plan: General LMA   Post-op Pain Management:    Induction: Intravenous  PONV Risk Score and Plan: Ondansetron, Dexamethasone, Midazolam and Treatment may vary due to age or medical condition  Airway Management Planned: Oral ETT  Additional Equipment:   Intra-op Plan:   Post-operative Plan: Extubation in OR  Informed Consent: I have reviewed the patients History and Physical, chart, labs and discussed the procedure including the risks, benefits and alternatives for the proposed anesthesia with the patient or authorized representative who has indicated his/her understanding and acceptance.     Dental Advisory Given  Plan Discussed with: Anesthesiologist, CRNA and Surgeon  Anesthesia Plan Comments: (Patient consented for risks of anesthesia including but not limited to:  - adverse reactions to medications - damage to eyes,  teeth, lips or other oral mucosa - nerve damage due to positioning  - sore throat or hoarseness - Damage to heart, brain, nerves, lungs, other parts of body or loss of life  Patient voiced understanding.)       Anesthesia Quick Evaluation

## 2023-07-13 NOTE — Op Note (Signed)
Umbilical Hernia Repair  Pre-operative Diagnosis: Umbilical hernia  Post-operative Diagnosis: same  Surgeon: Campbell Lerner, MD FACS  Anesthesia: General   Findings: <2 cm fascial defect diameter    Estimated Blood Loss: 5 mL                 Specimens: sac,  discarded.         Complications: none              Procedure Details  The patient was seen again in the Holding Room. The benefits, complications, treatment options, and expected outcomes were discussed with the patient. The risks of bleeding, infection, recurrence of symptoms, failure to resolve symptoms, bowel injury, mesh placement, mesh infection, any of which could require further surgery were reviewed with the patient. The likelihood of improving the patient's symptoms with return to their baseline status is good.  The patient and/or family concurred with the proposed plan, giving informed consent.  The patient was taken to Operating Room, identified, and the procedure verified.  A Time Out was held and the above information confirmed.  Prior to the induction of general anesthesia, antibiotic prophylaxis was administered. VTE prophylaxis was in place. General endotracheal anesthesia was then administered and tolerated well. After the induction, the abdomen was prepped with Chloraprep and draped in the sterile fashion. The patient was positioned in the supine position.  Local infiltration of quarter percent Marcaine with epinephrine is utilized. Incision was created with a scalpel over the hernia defect. Electrocautery was used to dissect through subcutaneous tissue, the hernia sac was opened excised. The hernia was measured. 1.5+ cm.   I closed the hernia defect with interrupted 0 Ethibond sutures in a vest-over-pants fashion.   Incision was closed in a 2 layer fashion with 3-0 Vicryl to pexy the umbilical skin and 4-0 Monocryl. Dermabond was used to coat the skin. Marcaine quarter percent with epinephrine was used to  inject the site. Patient tolerated procedure well and there were no immediate complications. Needle and laparotomy counts were correct   Campbell Lerner, M.D., [facility]  07/13/2023 ; 3:45 PM

## 2023-07-14 ENCOUNTER — Encounter: Payer: Self-pay | Admitting: Surgery

## 2023-07-16 ENCOUNTER — Encounter: Payer: Self-pay | Admitting: Family Medicine

## 2023-07-26 ENCOUNTER — Encounter: Payer: Self-pay | Admitting: Physician Assistant

## 2023-07-26 ENCOUNTER — Ambulatory Visit: Payer: PPO | Admitting: Physician Assistant

## 2023-07-26 VITALS — BP 150/84 | HR 61 | Temp 98.0°F | Ht 71.0 in | Wt 193.8 lb

## 2023-07-26 DIAGNOSIS — K429 Umbilical hernia without obstruction or gangrene: Secondary | ICD-10-CM | POA: Diagnosis not present

## 2023-07-26 DIAGNOSIS — Z09 Encounter for follow-up examination after completed treatment for conditions other than malignant neoplasm: Secondary | ICD-10-CM

## 2023-07-26 NOTE — Patient Instructions (Signed)

## 2023-07-26 NOTE — Progress Notes (Signed)
[facility] POST-OP OFFICE VISIT  07/26/2023  HPI: Shane [name] is a 72 y.o. male 13 days s/p umbilical hernia repair with Dr Claudine Mouton   He is doing well  Only needed OTC pain medication on night 1 No fever, chills, nausea, emesis, or bowel changes Incision itself is well healed Ambulating without issue No other complaints   Vital signs: BP (!) 150/84   Pulse 61   Temp 98 F (36.7 C)   Ht 5\' 11"  (1.803 m)   Wt 193 lb 12.8 oz (87.9 kg)   SpO2 99%   BMI 27.03 kg/m    Physical Exam: Constitutional: Well appearing male, NAD Abdomen: Soft, non-tender, non-distended, no rebound/guarding Skin: Umbilical incisions is healing well; no erythema; no drainage; no swelling    Assessment/Plan: This is a 72 y.o. male 13 days s/p umbilical hernia repair with Dr Claudine Mouton    - Pain control prn  - Reviewed wound care recommendation  - Reviewed lifting restrictions; 4-6 weeks total  - He can follow up on as needed basis; He understands to call with questions/concerns  -- Lynden Oxford, PA-C [facility] 07/26/2023, 10:43 AM M-F: 7am - 4pm

## 2023-11-15 ENCOUNTER — Ambulatory Visit (INDEPENDENT_AMBULATORY_CARE_PROVIDER_SITE_OTHER): Payer: PPO | Admitting: Family Medicine

## 2023-11-15 ENCOUNTER — Encounter: Payer: Self-pay | Admitting: Family Medicine

## 2023-11-15 VITALS — BP 124/76 | HR 63 | Temp 97.9°F | Ht 70.5 in | Wt 202.5 lb

## 2023-11-15 DIAGNOSIS — Z8349 Family history of other endocrine, nutritional and metabolic diseases: Secondary | ICD-10-CM

## 2023-11-15 DIAGNOSIS — Z7901 Long term (current) use of anticoagulants: Secondary | ICD-10-CM

## 2023-11-15 DIAGNOSIS — Z0000 Encounter for general adult medical examination without abnormal findings: Secondary | ICD-10-CM

## 2023-11-15 DIAGNOSIS — R5382 Chronic fatigue, unspecified: Secondary | ICD-10-CM

## 2023-11-15 DIAGNOSIS — E7800 Pure hypercholesterolemia, unspecified: Secondary | ICD-10-CM

## 2023-11-15 DIAGNOSIS — I700 Atherosclerosis of aorta: Secondary | ICD-10-CM

## 2023-11-15 DIAGNOSIS — I2510 Atherosclerotic heart disease of native coronary artery without angina pectoris: Secondary | ICD-10-CM

## 2023-11-15 DIAGNOSIS — Z7189 Other specified counseling: Secondary | ICD-10-CM

## 2023-11-15 DIAGNOSIS — K529 Noninfective gastroenteritis and colitis, unspecified: Secondary | ICD-10-CM

## 2023-11-15 DIAGNOSIS — N289 Disorder of kidney and ureter, unspecified: Secondary | ICD-10-CM | POA: Insufficient documentation

## 2023-11-15 DIAGNOSIS — I10 Essential (primary) hypertension: Secondary | ICD-10-CM

## 2023-11-15 DIAGNOSIS — Z8719 Personal history of other diseases of the digestive system: Secondary | ICD-10-CM

## 2023-11-15 DIAGNOSIS — F1994 Other psychoactive substance use, unspecified with psychoactive substance-induced mood disorder: Secondary | ICD-10-CM

## 2023-11-15 DIAGNOSIS — Z86718 Personal history of other venous thrombosis and embolism: Secondary | ICD-10-CM

## 2023-11-15 DIAGNOSIS — C61 Malignant neoplasm of prostate: Secondary | ICD-10-CM

## 2023-11-15 DIAGNOSIS — R5383 Other fatigue: Secondary | ICD-10-CM

## 2023-11-15 DIAGNOSIS — Z1211 Encounter for screening for malignant neoplasm of colon: Secondary | ICD-10-CM

## 2023-11-15 LAB — RENAL FUNCTION PANEL
Albumin: 4.6 g/dL (ref 3.5–5.2)
BUN: 19 mg/dL (ref 6–23)
CO2: 27 meq/L (ref 19–32)
Calcium: 10.1 mg/dL (ref 8.4–10.5)
Chloride: 104 meq/L (ref 96–112)
Creatinine, Ser: 1.26 mg/dL (ref 0.40–1.50)
GFR: 56.67 mL/min — ABNORMAL LOW (ref >60.00)
Glucose, Bld: 97 mg/dL (ref 70–99)
Phosphorus: 3.1 mg/dL (ref 2.3–4.6)
Potassium: 4.9 meq/L (ref 3.5–5.1)
Sodium: 139 meq/L (ref 135–145)

## 2023-11-15 LAB — TSH: TSH: 1.69 u[IU]/mL (ref 0.35–5.50)

## 2023-11-15 LAB — VITAMIN B12: Vitamin B-12: 151 pg/mL — ABNORMAL LOW (ref 211–911)

## 2023-11-15 NOTE — Assessment & Plan Note (Addendum)
Advanced planning - has this set up. Wife would be HCPOA. Asked to bring [place] copy. Full code, but wouldn't want prolonged life support if terminal condition. Thinks would be ok with feeding tube.

## 2023-11-15 NOTE — Patient Instructions (Addendum)
Lab visit to recheck kidney function today  Ensure staying well hydrated We will sign you up for repeat cologuard.  Ask for dates of COVID and flu shot to update your chart.  Return as needed or in 1 year for next physical  Good to see you today.

## 2023-11-15 NOTE — Assessment & Plan Note (Signed)
Preventative protocols reviewed and updated unless pt declined. Discussed healthy diet and lifestyle.  

## 2023-11-15 NOTE — Assessment & Plan Note (Signed)
Update renal panel

## 2023-11-15 NOTE — Progress Notes (Unsigned)
Ph: [phone] Fax: [phone]   Patient ID: Shane [name], male    DOB: October 12, 1951, 72 y.o.   MRN: [id number]  This visit was conducted in person.  BP 124/76   Pulse 63   Temp 97.9 F (36.6 C) (Oral)   Ht 5' 10.5" (1.791 m)   Wt 202 lb 8 oz (91.9 kg)   SpO2 98%   BMI 28.65 kg/m    CC: CPE Subjective:   HPI: Shane [name] is a 72 y.o. male presenting on 11/15/2023 for Annual Exam (MCR prt 2 [AWV- 04/12/23].)   Saw health advisor 05/2023 for medicare wellness visit. Note reviewed.   No results found.  Flowsheet Row Office Visit from 05/20/2023 in [facility] [place]  PHQ-2 Total Score 1          05/26/2023    9:12 AM 05/20/2023   10:15 AM 04/12/2023   12:56 PM 05/07/2022    1:14 PM 05/06/2021    9:56 AM  Fall Risk   Falls in the past year? 0 0 0 0 0  Number falls in past yr: 0  0 0   Injury with Fall? 0  0 0   Risk for fall due to :   No Fall Risks    Follow up   Falls prevention discussed;Falls evaluation completed Falls evaluation completed     S/p umbilical hernia repair 07/2023 (Dr Claudine Mouton).   Effexor (vs leuprolide) caused mental decompensation leading to aggression, agitation, obsessive thoughts, possible visual hallucinations (medication induced hypomania) - saw [facility] psychiatry started on seroquel 200mg  nightly and temporary Lunesta - now off all meds.   Metastatic prostate cancer to bladder, LN, and bone dx (06/2019) complicated by PE s/p xarelto and pseudomonas UTI resistant to treatment s/p initial stents then bilateral nephrostomy tubes (removed 2021) s/p IV cefepime course via PICC line. Saw urology Marlou Porch) onc Clelia Croft) and ID Orvan Falconer). Continues zytiga and lupron injection Q6 mo, tolerating well except for hot flashes. Continues Leuprolide and eliquis, but uro considering stopping treatment 05/2024. He is now on flomax BID. Upcoming DEXA scheduled 12/15/2023.   Ongoing diarrhea since radiation ended 12/2022. Loose/watery stools  every BM, 3x/day. No rectal pain. Tried fiber with some benefit.   Fmhx HH (father, paternal uncle, son) - personal iron levels have been normal. Declined genetic testing.   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   Calcium/mg/vit D supplement caused joint pains so he stopped this.  Notes fatigue over the past year. No neuropathy.   Preventative: Colonoscopy remotely normal. Cologuard normal 2017, positive 05/2020 (while actively inflamed hemorrhoids), subsequent iFOB returned negative 05/2020. Agrees to rpt Cologuard when not having hemorrhoid irritation.  Prostate cancer - see above.  Lung cancer screening - not eligible Flu shot - yearly COVID vaccine Pfizer 12/2019, 01/2020, booster 09/2020, 04/2021, bivalent 09/2021 Prevnar-13 2017, pneumovax 07/2017 Tdap 01/2017 Shingrix - 10/2019, 03/2020 Advanced planning - has this set up. Wife would be HCPOA. Asked to bring [place] copy. Full code, but wouldn't want prolonged life support if terminal condition. Thinks would be ok with feeding tube.  Seat belt use discussed  Sunscreen use discussed, no changing moles on skin. Sleep - averaging 8 hours/night  Not a smoker  Alcohol - 1-2 beer/day  Dentist yearly Eye exam yearly Bowel - no constipation, ongoing diarrhea  Bladder - nocturia x1-2, no incontinence.    Lives  with wife Grown children Occ: retired Engineer, civil (consulting), Quarry manager Edu: Bachelor of Art Activity: stays active working outdoors  Diet: some water, fruits/vegetables daily      Relevant past medical, surgical, family and social history reviewed and updated as indicated. Interim medical history since our last visit reviewed. Allergies and medications reviewed and updated. Outpatient Medications Prior to Visit  Medication Sig Dispense Refill   Calcium Polycarbophil (FIBER-CAPS PO) Take by mouth daily.     ELIQUIS 5  MG TABS tablet TAKE 1 TABLET BY MOUTH TWICE A DAY 60 tablet 6   Leuprolide Acetate, 4 Month, (ELIGARD) 30 MG injection Inject 30 mg into the skin every 6 (six) months. Per pt done at cancer center     Probiotic Product (PROBIOTIC PO) Take by mouth daily.     tamsulosin (FLOMAX) 0.4 MG CAPS capsule Take 0.4 mg by mouth 2 (two) times daily.     Calcium-Magnesium-Vitamin D (CITRACAL CALCIUM+D) 600-40-500 MG-MG-UNIT TB24 Take 1 tablet by mouth daily.     ibuprofen (ADVIL) 800 MG tablet Take 1 tablet (800 mg total) by mouth every 8 (eight) hours as needed.     No facility-administered medications prior to visit.     Per HPI unless specifically indicated in ROS section below Review of Systems  Constitutional:  Positive for fatigue. Negative for activity change, appetite change, chills, fever and unexpected weight change.  HENT:  Negative for hearing loss.   Eyes:  Negative for visual disturbance.  Respiratory:  Negative for cough, chest tightness, shortness of breath and wheezing.   Cardiovascular:  Negative for chest pain, palpitations and leg swelling.  Gastrointestinal:  Positive for diarrhea. Negative for abdominal distention, abdominal pain, blood in stool, constipation, nausea and vomiting.  Genitourinary:  Negative for difficulty urinating and hematuria.  Musculoskeletal:  Negative for arthralgias, myalgias and neck pain.  Skin:  Negative for rash.  Neurological:  Positive for dizziness (occ - flomax related). Negative for seizures, syncope and headaches.  Hematological:  Negative for adenopathy. Bruises/bleeds easily.  Psychiatric/Behavioral:  Negative for dysphoric mood. The patient is not nervous/anxious.     Objective:  BP 124/76   Pulse 63   Temp 97.9 F (36.6 C) (Oral)   Ht 5' 10.5" (1.791 m)   Wt 202 lb 8 oz (91.9 kg)   SpO2 98%   BMI 28.65 kg/m   Wt Readings from Last 3 Encounters:  11/15/23 202 lb 8 oz (91.9 kg)  07/26/23 193 lb 12.8 oz (87.9 kg)  07/13/23 195 lb  (88.5 kg)      Physical Exam Vitals and nursing note reviewed.  Constitutional:      General: He is not in acute distress.    Appearance: Normal appearance. He is well-developed. He is not ill-appearing.  HENT:     Head: Normocephalic and atraumatic.     Right Ear: Hearing, tympanic membrane, ear canal and external ear normal.     Left Ear: Hearing, tympanic membrane, ear canal and external ear normal.     Mouth/Throat:     Mouth: Mucous membranes are moist.     Pharynx: Oropharynx is clear. No oropharyngeal exudate or posterior oropharyngeal erythema.  Eyes:     General: No scleral icterus.    Extraocular Movements: Extraocular movements intact.     Conjunctiva/sclera: Conjunctivae normal.     Pupils: Pupils are equal, round, and reactive to light.  Neck:     Thyroid: No thyroid mass or thyromegaly.     Vascular: No  carotid bruit.  Cardiovascular:     Rate and Rhythm: Normal rate and regular rhythm.     Pulses: Normal pulses.          Radial pulses are 2+ on the right side and 2+ on the left side.     Heart sounds: Normal heart sounds. No murmur heard. Pulmonary:     Effort: Pulmonary effort is normal. No respiratory distress.     Breath sounds: Normal breath sounds. No wheezing, rhonchi or rales.  Abdominal:     General: Bowel sounds are normal. There is no distension.     Palpations: Abdomen is soft. There is no mass.     Tenderness: There is no abdominal tenderness. There is no guarding or rebound.     Hernia: No hernia is present.  Musculoskeletal:        General: Normal range of motion.     Cervical back: Normal range of motion and neck supple.     Right lower leg: No edema.     Left lower leg: No edema.  Lymphadenopathy:     Cervical: No cervical adenopathy.  Skin:    General: Skin is warm and dry.     Findings: No rash.  Neurological:     General: No focal deficit present.     Mental Status: He is alert and oriented to person, place, and time.  Psychiatric:         Mood and Affect: Mood normal.        Behavior: Behavior normal.        Thought Content: Thought content normal.        Judgment: Judgment normal.       Results for orders placed or performed in visit on 07/06/23  CBC with Differential/Platelet  Result Value Ref Range   WBC 3.8 (L) 4.0 - 10.5 K/uL   RBC 4.06 (L) 4.22 - 5.81 Mil/uL   Hemoglobin 13.3 13.0 - 17.0 g/dL   HCT 28.4 13.2 - 44.0 %   MCV 98.9 78.0 - 100.0 fl   MCHC 33.1 30.0 - 36.0 g/dL   RDW 10.2 72.5 - 36.6 %   Platelets 195.0 150.0 - 400.0 K/uL   Neutrophils Relative % 64.5 43.0 - 77.0 %   Lymphocytes Relative 18.5 12.0 - 46.0 %   Monocytes Relative 9.1 3.0 - 12.0 %   Eosinophils Relative 7.1 (H) 0.0 - 5.0 %   Basophils Relative 0.8 0.0 - 3.0 %   Neutro Abs 2.4 1.4 - 7.7 K/uL   Lymphs Abs 0.7 0.7 - 4.0 K/uL   Monocytes Absolute 0.3 0.1 - 1.0 K/uL   Eosinophils Absolute 0.3 0.0 - 0.7 K/uL   Basophils Absolute 0.0 0.0 - 0.1 K/uL  Comprehensive metabolic panel  Result Value Ref Range   Sodium 139 135 - 145 mEq/L   Potassium 4.4 3.5 - 5.1 mEq/L   Chloride 105 96 - 112 mEq/L   CO2 27 19 - 32 mEq/L   Glucose, Bld 100 (H) 70 - 99 mg/dL   BUN 19 6 - 23 mg/dL   Creatinine, Ser 4.40 0.40 - 1.50 mg/dL   Total Bilirubin 0.7 0.2 - 1.2 mg/dL   Alkaline Phosphatase 46 39 - 117 U/L   AST 33 0 - 37 U/L   ALT 38 0 - 53 U/L   Total Protein 6.8 6.0 - 8.3 g/dL   Albumin 4.2 3.5 - 5.2 g/dL   GFR 34.74 (L) >25.95 mL/min   Calcium 9.7 8.4 - 10.5  mg/dL  Lipid panel  Result Value Ref Range   Cholesterol 196 0 - 200 mg/dL   Triglycerides 960.4 0.0 - 149.0 mg/dL   HDL 54.09 >81.19 mg/dL   VLDL 14.7 0.0 - 82.9 mg/dL   LDL Cholesterol 562 (H) 0 - 99 mg/dL   Total CHOL/HDL Ratio 4    NonHDL 145.62    No results found for: "25OHVITD2", "25OHVITD3", "VD25OH"  Assessment & Plan:   Problem List Items Addressed This Visit     Advanced care planning/counseling discussion (Chronic)    Advanced planning - has this set up. Wife  would be HCPOA. Asked to bring [place] copy. Full code, but wouldn't want prolonged life support if terminal condition. Thinks would be ok with feeding tube.       Health maintenance examination - Primary (Chronic)    Preventative protocols reviewed and updated unless pt declined. Discussed healthy diet and lifestyle.       Family history of hemochromatosis   HLD (hyperlipidemia)    Chronic, LDL above goal, remains off statin since 2023. Reviewed diet choices to improve cholesterol levels. No strong fmhx CAD/MI or CVA.  The 10-year ASCVD risk score (Arnett DK, et al., 2019) is: 20.7%   Values used to calculate the score:     Age: 84 years     Sex: Male     Is Non-Hispanic African American: No     Diabetic: No     Tobacco smoker: No     Systolic Blood Pressure: 124 mmHg     Is BP treated: No     HDL Cholesterol: 50 mg/dL     Total Cholesterol: 196 mg/dL       Prostate cancer metastatic to multiple sites [facility])    Appreciate urology care as well as onc/ rad/onc.  Continues Leuprolide.       History of DVT of lower extremity    Continues lifelong eliquis due to recurrence of DVT and h/o  PE      Atherosclerosis of aorta (HCC)    Remains off statin.       CAD (coronary artery disease)    Off statin. Consider coronary calcium score.       Hypertension    Stable period off medication.       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       History of umbilical hernia repair   Anticoagulant long-term use   Renal insufficiency    Latest GFR dropped to 48 (06/2023).  Update renal panel today BP well controlled, no h/o diabetes.  ?dehydration related during summer months      Relevant Orders   Renal function panel   VITAMIN D 25 Hydroxy (Vit-D Deficiency, Fractures)   Chronic diarrhea    Ongoing diarrhea since radiation ended 12/2202. No rectal pain.  He has started fiber and  probiotic which may be helping.       Chronic fatigue    Update labs to evaluate for reversible causes of fatigue      Relevant Orders   Vitamin B12   TSH   VITAMIN D 25 Hydroxy (Vit-D Deficiency, Fractures)   Other Visit Diagnoses     Special screening for malignant neoplasms, colon       Relevant Orders   Cologuard        No orders of the defined types were placed in this encounter.   Orders Placed This  Encounter  Procedures   Cologuard   Renal function panel   Vitamin B12   TSH   VITAMIN D 25 Hydroxy (Vit-D Deficiency, Fractures)    Standing Status:   Future    Standing Expiration Date:   11/15/2024    Patient Instructions  Lab visit to recheck kidney function today  Ensure staying well hydrated We will sign you up for repeat cologuard.  Ask for dates of COVID and flu shot to update your chart.  Return as needed or in 1 year for next physical  Good to see you today.   Follow up plan: Return in about 1 year (around 11/14/2024) for annual exam, prior fasting for blood work, medicare wellness visit.  Eustaquio Boyden, MD

## 2023-11-16 ENCOUNTER — Ambulatory Visit (INDEPENDENT_AMBULATORY_CARE_PROVIDER_SITE_OTHER): Payer: PPO

## 2023-11-16 ENCOUNTER — Encounter: Payer: Self-pay | Admitting: Hematology

## 2023-11-16 ENCOUNTER — Encounter: Payer: Self-pay | Admitting: Family Medicine

## 2023-11-16 DIAGNOSIS — R5382 Chronic fatigue, unspecified: Secondary | ICD-10-CM | POA: Insufficient documentation

## 2023-11-16 DIAGNOSIS — N289 Disorder of kidney and ureter, unspecified: Secondary | ICD-10-CM | POA: Diagnosis not present

## 2023-11-16 DIAGNOSIS — K529 Noninfective gastroenteritis and colitis, unspecified: Secondary | ICD-10-CM | POA: Insufficient documentation

## 2023-11-16 LAB — VITAMIN D 25 HYDROXY (VIT D DEFICIENCY, FRACTURES): VITD: 20.97 ng/mL — ABNORMAL LOW (ref 30.00–100.00)

## 2023-11-16 NOTE — Assessment & Plan Note (Addendum)
Continues lifelong eliquis due to recurrence of DVT and h/o  PE

## 2023-11-16 NOTE — Assessment & Plan Note (Signed)
?  Effexor induced hypomania - overall doing better off medication.

## 2023-11-16 NOTE — Assessment & Plan Note (Addendum)
Ongoing diarrhea since radiation ended 12/2202. No rectal pain.  He has started fiber and probiotic which may be helping.

## 2023-11-16 NOTE — Assessment & Plan Note (Addendum)
Chronic, LDL above goal, remains off statin since 2023. Reviewed diet choices to improve cholesterol levels. No strong fmhx CAD/MI or CVA.  The 10-year ASCVD risk score (Arnett DK, et al., 2019) is: 20.7%   Values used to calculate the score:     Age: 72 years     Sex: Male     Is Non-Hispanic African American: No     Diabetic: No     Tobacco smoker: No     Systolic Blood Pressure: 124 mmHg     Is BP treated: No     HDL Cholesterol: 50 mg/dL     Total Cholesterol: 196 mg/dL

## 2023-11-16 NOTE — Assessment & Plan Note (Signed)
Off statin. Consider coronary calcium score.

## 2023-11-16 NOTE — Assessment & Plan Note (Signed)
Update labs to evaluate for reversible causes of fatigue.

## 2023-11-16 NOTE — Assessment & Plan Note (Signed)
Remains off statin.

## 2023-11-16 NOTE — Assessment & Plan Note (Signed)
Stable period off medication.  

## 2023-11-16 NOTE — Assessment & Plan Note (Signed)
Appreciate urology care as well as onc/ rad/onc.  Continues Leuprolide.

## 2023-11-17 ENCOUNTER — Encounter: Payer: Self-pay | Admitting: Genetic Counselor

## 2023-11-17 DIAGNOSIS — Z1509 Genetic susceptibility to other malignant neoplasm: Secondary | ICD-10-CM | POA: Insufficient documentation

## 2023-11-22 ENCOUNTER — Other Ambulatory Visit: Payer: Self-pay | Admitting: Family Medicine

## 2023-11-22 ENCOUNTER — Encounter: Payer: Self-pay | Admitting: Family Medicine

## 2023-11-22 DIAGNOSIS — E538 Deficiency of other specified B group vitamins: Secondary | ICD-10-CM | POA: Insufficient documentation

## 2023-11-22 DIAGNOSIS — E559 Vitamin D deficiency, unspecified: Secondary | ICD-10-CM | POA: Insufficient documentation

## 2023-11-22 MED ORDER — VITAMIN B-12 1000 MCG PO TABS: 1000.0000 ug | ORAL_TABLET | Freq: Every day | ORAL | Status: AC

## 2023-11-22 MED ORDER — VITAMIN D3 25 MCG (1000 UT) PO CAPS: 1.0000 | ORAL_CAPSULE | Freq: Every day | ORAL | Status: DC

## 2023-11-22 NOTE — Addendum Note (Signed)
Addended by: Eustaquio Boyden on: 11/22/2023 08:12 AM   Modules accepted: Orders

## 2023-12-15 ENCOUNTER — Ambulatory Visit
Admission: RE | Admit: 2023-12-15 | Discharge: 2023-12-15 | Disposition: A | Discharge status: Home / Self Care | Payer: PPO | Source: Ambulatory Visit | Attending: Urology | Admitting: Urology

## 2023-12-15 ENCOUNTER — Encounter: Payer: Self-pay | Admitting: Hematology

## 2023-12-15 DIAGNOSIS — M8588 Other specified disorders of bone density and structure, other site: Secondary | ICD-10-CM | POA: Diagnosis not present

## 2023-12-15 DIAGNOSIS — C61 Malignant neoplasm of prostate: Secondary | ICD-10-CM | POA: Diagnosis not present

## 2023-12-18 LAB — COLOGUARD

## 2023-12-20 ENCOUNTER — Telehealth: Payer: Self-pay

## 2023-12-20 NOTE — Telephone Encounter (Signed)
Vaccines have been updated in patients chart.

## 2023-12-20 NOTE — Telephone Encounter (Signed)
 Copied from CRM [id number]. Topic: General - Other >> Dec 20, 2023 10:28 AM Shane [name] wrote: Reason for CRM: Pt wanted to update his records to reflect he received flu and Covid shots 10/05/23

## 2023-12-21 ENCOUNTER — Encounter: Payer: Self-pay | Admitting: Family Medicine

## 2023-12-27 DIAGNOSIS — Z1211 Encounter for screening for malignant neoplasm of colon: Secondary | ICD-10-CM | POA: Diagnosis not present

## 2024-01-02 ENCOUNTER — Encounter: Payer: Self-pay | Admitting: Family Medicine

## 2024-01-02 DIAGNOSIS — C61 Malignant neoplasm of prostate: Secondary | ICD-10-CM | POA: Diagnosis not present

## 2024-01-02 LAB — COLOGUARD: COLOGUARD: NEGATIVE

## 2024-01-10 DIAGNOSIS — C61 Malignant neoplasm of prostate: Secondary | ICD-10-CM | POA: Diagnosis not present

## 2024-01-13 ENCOUNTER — Encounter: Payer: Self-pay | Admitting: Family Medicine

## 2024-01-28 ENCOUNTER — Other Ambulatory Visit: Payer: Self-pay | Admitting: Family Medicine

## 2024-01-28 DIAGNOSIS — Z86711 Personal history of pulmonary embolism: Secondary | ICD-10-CM

## 2024-01-30 NOTE — Telephone Encounter (Signed)
 Eliquis Last filled:  01/09/24, #60 Last OV:  11/15/23, CPE Next OV:  02/15/24, 3 mo f/u

## 2024-02-14 ENCOUNTER — Other Ambulatory Visit (INDEPENDENT_AMBULATORY_CARE_PROVIDER_SITE_OTHER): Payer: PPO

## 2024-02-14 DIAGNOSIS — E538 Deficiency of other specified B group vitamins: Secondary | ICD-10-CM

## 2024-02-14 DIAGNOSIS — E559 Vitamin D deficiency, unspecified: Secondary | ICD-10-CM | POA: Diagnosis not present

## 2024-02-14 LAB — VITAMIN B12: Vitamin B-12: 299 pg/mL (ref 211–911)

## 2024-02-14 LAB — VITAMIN D 25 HYDROXY (VIT D DEFICIENCY, FRACTURES): VITD: 30.03 ng/mL (ref 30.00–100.00)

## 2024-02-15 ENCOUNTER — Ambulatory Visit (INDEPENDENT_AMBULATORY_CARE_PROVIDER_SITE_OTHER): Payer: PPO | Admitting: Family Medicine

## 2024-02-15 ENCOUNTER — Encounter: Payer: Self-pay | Admitting: Family Medicine

## 2024-02-15 VITALS — BP 148/76 | HR 65 | Temp 97.7°F | Ht 70.5 in | Wt 205.0 lb

## 2024-02-15 DIAGNOSIS — E559 Vitamin D deficiency, unspecified: Secondary | ICD-10-CM

## 2024-02-15 DIAGNOSIS — I10 Essential (primary) hypertension: Secondary | ICD-10-CM | POA: Diagnosis not present

## 2024-02-15 DIAGNOSIS — E538 Deficiency of other specified B group vitamins: Secondary | ICD-10-CM

## 2024-02-15 DIAGNOSIS — C61 Malignant neoplasm of prostate: Secondary | ICD-10-CM

## 2024-02-15 MED ORDER — CYANOCOBALAMIN 1000 MCG/ML IJ SOLN
1000.0000 ug | Freq: Once | INTRAMUSCULAR | Status: AC
  Administered 2024-02-15: 1000 ug via INTRAMUSCULAR

## 2024-02-15 MED ORDER — VITAMIN D3 25 MCG (1000 UT) PO CAPS: 1.0000 | ORAL_CAPSULE | Freq: Every day | ORAL | Status: AC

## 2024-02-15 NOTE — Progress Notes (Signed)
 Ph: [phone] Fax: [phone]   Patient ID: Shane [name], male    DOB: May 26, 1951, 73 y.o.   MRN: [id number]  This visit was conducted in person.  BP (!) 148/76 (BP Location: Right Arm, Cuff Size: Normal)   Pulse 65   Temp 97.7 F (36.5 C) (Oral)   Ht 5' 10.5" (1.791 m)   Wt 205 lb (93 kg)   SpO2 97%   BMI 29.00 kg/m    No data found. No data found.  Orthostatics: BP supine 152/76, BP standing 148/70  CC: 3 mo f/u visit  Subjective:   HPI: Shane [name] is a 73 y.o. male presenting on 02/15/2024 for Medical Management of Chronic Issues (Here for 3 mo f/u.)   Home BP readings 120-130 systolic.  He monitors BP at [facility] gym - he notes exertional dizziness and hypotension (102 systolic) when walking up 2 flights of stairs. Also notes occasional lightheadedness when working outdoors.  Not on antihypertensive medication.   No chest pain/tightness, dyspnea.  Chronic anosmia for at least 3 yrs.  Notes worsening memory.  No tremor, stiffness or abnormal ambulation.   Recently found to have low vitamin B12 and D levels. This was checked in setting of increased fatigue.   Prostate cancer - recently changed treatment from Eligard to Orgovyx. Planned to finish treatment 06/2024. He attributes fatigue partly to this medicine.      Relevant past medical, surgical, family and social history reviewed and updated as indicated. Interim medical history since our last visit reviewed. Allergies and medications reviewed and updated. Outpatient Medications Prior to Visit  Medication Sig Dispense Refill   Calcium Polycarbophil (FIBER-CAPS PO) Take by mouth daily.     cyanocobalamin (VITAMIN B12) 1000 MCG tablet Take 1 tablet (1,000 mcg total) by mouth daily.     ELIQUIS 5 MG TABS tablet TAKE 1 TABLET BY MOUTH 2 TIMES A DAY 60 tablet 6   Probiotic Product (PROBIOTIC PO) Take by mouth daily.     relugolix (ORGOVYX) 120 MG tablet Take 1 tablet (120 mg total) by mouth daily. For  prostate cancer     tamsulosin (FLOMAX) 0.4 MG CAPS capsule Take 0.4 mg by mouth 2 (two) times daily.     Cholecalciferol (VITAMIN D3) 25 MCG (1000 UT) CAPS Take 1 capsule (1,000 Units total) by mouth daily.     No facility-administered medications prior to visit.     Per HPI unless specifically indicated in ROS section below Review of Systems  Objective:  BP (!) 148/76 (BP Location: Right Arm, Cuff Size: Normal)   Pulse 65   Temp 97.7 F (36.5 C) (Oral)   Ht 5' 10.5" (1.791 m)   Wt 205 lb (93 kg)   SpO2 97%   BMI 29.00 kg/m   Wt Readings from Last 3 Encounters:  02/15/24 205 lb (93 kg)  11/15/23 202 lb 8 oz (91.9 kg)  07/26/23 193 lb 12.8 oz (87.9 kg)      Physical Exam Vitals and nursing note reviewed.  Constitutional:      Appearance: Normal appearance. He is not ill-appearing.  HENT:     Mouth/Throat:     Mouth: Mucous membranes are moist.     Pharynx: Oropharynx is clear. No oropharyngeal exudate or posterior oropharyngeal erythema.  Eyes:     Extraocular Movements: Extraocular movements intact.     Pupils: Pupils are equal, round, and reactive to light.  Cardiovascular:     Rate and Rhythm: Normal rate and regular rhythm.  Pulses: Normal pulses.     Heart sounds: Normal heart sounds. No murmur heard. Pulmonary:     Effort: Pulmonary effort is normal. No respiratory distress.     Breath sounds: Normal breath sounds. No wheezing, rhonchi or rales.  Musculoskeletal:     Right lower leg: No edema.     Left lower leg: No edema.  Skin:    General: Skin is warm and dry.     Findings: No rash.  Neurological:     Mental Status: He is alert.  Psychiatric:        Mood and Affect: Mood normal.        Behavior: Behavior normal.       Results for orders placed or performed in visit on 02/14/24  VITAMIN D 25 Hydroxy (Vit-D Deficiency, Fractures)   Collection Time: 02/14/24  8:06 AM  Result Value Ref Range   VITD 30.03 30.00 - 100.00 ng/mL  Vitamin B12    Collection Time: 02/14/24  8:06 AM  Result Value Ref Range   Vitamin B-12 299 211 - 911 pg/mL    Assessment & Plan:   Problem List Items Addressed This Visit     Prostate cancer metastatic to multiple sites [facility]) - Primary   Sees urology Now on Orgovyx with planned end date 06/2024.       Hypertension   BP mildly elevated Will not start antihypertensive given endorsed low readings.  Orthostatics in office negative (remain mildly elevated).       Vitamin B12 deficiency   Levels improving B12 shot today, then continue oral b12 replacement.       Vitamin D deficiency   Discussed vit D dosing  rec 1000 international units  during summer months, 2000 units during winter months.         Meds ordered this encounter  Medications   Cholecalciferol (VITAMIN D3) 25 MCG (1000 UT) CAPS    Sig: Take 1-2 capsules (1,000-2,000 Units total) by mouth daily. 1 during spring/summer, 2 during fall/winter   cyanocobalamin (VITAMIN B12) injection 1,000 mcg    No orders of the defined types were placed in this encounter.   Patient Instructions  B12 shot today Continue oral b12 daily over the counter  Increase vitamin D3 to 2000 units daily during low sun months (winter), then may drop to 1000 units daily during high sun months (spring/summer). Orthostatic vital signs today.   Follow up plan: Return if symptoms worsen or fail to improve.  Eustaquio Boyden, MD

## 2024-02-15 NOTE — Assessment & Plan Note (Signed)
 Sees urology Now on Orgovyx with planned end date 06/2024.

## 2024-02-15 NOTE — Assessment & Plan Note (Signed)
 BP mildly elevated Will not start antihypertensive given endorsed low readings.  Orthostatics in office negative (remain mildly elevated).

## 2024-02-15 NOTE — Patient Instructions (Addendum)
 B12 shot today Continue oral b12 daily over the counter  Increase vitamin D3 to 2000 units daily during low sun months (winter), then may drop to 1000 units daily during high sun months (spring/summer). Orthostatic vital signs today.

## 2024-02-15 NOTE — Assessment & Plan Note (Signed)
 Discussed vit D dosing  rec 1000 international units  during summer months, 2000 units during winter months.

## 2024-02-15 NOTE — Assessment & Plan Note (Addendum)
 Levels improving B12 shot today, then continue oral b12 replacement.

## 2024-04-12 ENCOUNTER — Ambulatory Visit: Payer: PPO

## 2024-04-12 VITALS — Ht 70.5 in | Wt 196.0 lb

## 2024-04-12 DIAGNOSIS — Z0000 Encounter for general adult medical examination without abnormal findings: Secondary | ICD-10-CM

## 2024-04-12 NOTE — Progress Notes (Signed)
 Because this visit was a virtual/telehealth visit, some criteria may be missing or patient reported. Any vitals not documented were not able to be obtained and vitals that have been documented are patient reported.    Subjective:   Shane [name] is a 73 y.o. who presents for a Medicare Wellness preventive visit.  Visit Complete: Virtual I connected with  Shane [name] on 04/12/24 by a audio enabled telemedicine application and verified that I am speaking with the correct person using two identifiers.  Patient Location: Home  Provider Location: Office/Clinic  I discussed the limitations of evaluation and management by telemedicine. The patient expressed understanding and agreed to proceed.  Vital Signs: Because this visit was a virtual/telehealth visit, some criteria may be missing or patient reported. Any vitals not documented were not able to be obtained and vitals that have been documented are patient reported.  VideoDeclined- This patient declined Librarian, academic. Therefore the visit was completed with audio only.  Persons Participating in Visit: Patient.  AWV Questionnaire: Yes: Patient Medicare AWV questionnaire was completed by the patient on 04/11/24; I have confirmed that all information answered by patient is correct and no changes since this date.  Cardiac Risk Factors include: advanced age (>52men, >23 women);dyslipidemia;male gender;hypertension     Objective:    Today's Vitals   04/12/24 1012  Weight: 196 lb (88.9 kg)  Height: 5' 10.5" (1.791 m)   Body mass index is 27.73 kg/m.     07/13/2023    1:36 PM 07/11/2023   10:22 AM 04/12/2023    1:01 PM 11/16/2022    8:00 AM 09/28/2022    8:30 AM 05/07/2022    1:14 PM 02/12/2022    4:32 AM  Advanced Directives  Does Patient Have a Medical Advance Directive? Yes Yes Yes Yes Yes Yes No  Type of Estate agent of [place];Living will Healthcare Power of [place];Living will  Healthcare Power of [place];Living will Healthcare Power of [place];Living will Healthcare Power of [place];Living will Healthcare Power of [place];Living will   Does patient want to make changes to medical advance directive?  No - Patient declined       Copy of Healthcare Power of Attorney in Chart?  No - copy requested No - copy requested No - copy requested  No - copy requested     Current Medications (verified) Outpatient Encounter Medications as of 04/12/2024  Medication Sig   Calcium  Polycarbophil (FIBER-CAPS PO) Take by mouth daily.   Cholecalciferol (VITAMIN D3) 25 MCG (1000 UT) CAPS Take 1-2 capsules (1,000-2,000 Units total) by mouth daily. 1 during spring/summer, 2 during fall/winter   cyanocobalamin  (VITAMIN B12) 1000 MCG tablet Take 1 tablet (1,000 mcg total) by mouth daily.   ELIQUIS  5 MG TABS tablet TAKE 1 TABLET BY MOUTH 2 TIMES A DAY   Probiotic Product (PROBIOTIC PO) Take by mouth daily.   relugolix (ORGOVYX) 120 MG tablet Take 1 tablet (120 mg total) by mouth daily. For prostate cancer   tamsulosin  (FLOMAX ) 0.4 MG CAPS capsule Take 0.4 mg by mouth 2 (two) times daily.   No facility-administered encounter medications on file as of 04/12/2024.    Allergies (verified) Effexor  xr [venlafaxine  hcl er], Hydrocodone , Oxycodone , Bactrim  [sulfamethoxazole -trimethoprim ], Penicillins, Vancomycin , and Zithromax  [azithromycin ]   History: Past Medical History:  Diagnosis Date   Anticoagulant long-term use    eliquis --- managed by pcp  for recurrent PEs   Cancer involving bladder by direct extension from prostate (HCC) 06/28/2019   Cystoscopy [facility]) -  2 bladder tumors, 1 on anterior prostate planned TURBT/TURP 06/2019 Biopsy - Gleason 5+4=9 prostate cancer [place]  started 07/2019 planned metastatic survey   COVID-19 virus infection 10/27/2021   Family history of hemochromatosis    son   Family history of peritoneal cancer    Family history of prostate cancer    History of  pulmonary embolus (PE) 06/2019   incidental finding on CT when staging done for prostate cancer dx,  acute PEs RML/ RUL treated w/ blood thinner;   recurrent 06-23-2021  RLL ;  recurrent 02-11-2022  this time is on long term use eliquis   (11-11-2022  he had never had blood clots before 2020 and note since 03/ 2023   History of syncope 2006   previousliy followed by cardiologist- dr Rodolfo Clan--  recurrent syncope,  implanted loop recorder 10/ 2006,  explanted 03/ 2017 since no syncope for 10 yrs   (11-11-2022  per pt no issues since 2017)   Hypertension    monitor by pcp and pt monitoring at home---  has improved , pcp tood pt off med   Malignant neoplasm prostate [facility]) 06/2019   urologist--  dr herrick/  radiation oncologist--- dr Lorri Rota---   dx 07/ 2020,  mets to bladder,  Gleason 5+4   Nocturia    Seasonal allergies    Wears glasses    Past Surgical History:  Procedure Laterality Date   CYSTOSCOPY W/ RETROGRADES Bilateral 07/06/2019   Procedure: CYSTOSCOPY WITH RETROGRADE PYELOGRAM, BILATERAL STENT PLACEMENT;  Surgeon: Andrez Banker, MD;  Location: [facility];  Service: Urology;  Laterality: Bilateral;   EP IMPLANTABLE DEVICE N/A 02/25/2016   Procedure: Loop Recorder Removal;  Surgeon: Verona Goodwill, MD;  Location: [facility] INVASIVE CV LAB;  Service: Cardiovascular;  Laterality: N/A;    EXPLANTED   GOLD SEED IMPLANT N/A 11/16/2022   Procedure: GOLD SEED IMPLANT INTO PROSTATE;  Surgeon: Homero Luster, MD;  Location: [facility];  Service: Urology;  Laterality: N/A;   IR NEPHROSTOMY PLACEMENT LEFT  02/14/2020   IR NEPHROSTOMY PLACEMENT RIGHT  02/14/2020   LIPOMA EXCISION N/A 02/09/2016   Procedure: EXCISION NECK LIPOMA;  Surgeon: Lillette Reid III, MD;  Location:  SURGERY CENTER;  Service: General;  Laterality: N/A;   LOOP RECORDER INSERTION  10/04/2005   @[facility]  by dr Rodolfo Clan   TRANSRECTAL ULTRASOUND N/A 11/16/2022   Procedure: TRANSRECTAL ULTRASOUND;  Surgeon: Homero Luster, MD;   Location[facility];  Service: Urology;  Laterality: N/A;   TRANSURETHRAL RESECTION OF BLADDER TUMOR N/A 07/06/2019   Procedure: TRANSURETHRAL RESECTION OF BLADDER TUMOR (TURBT);  Surgeon: Andrez Banker, MD;  Location: [facility];  Service: Urology;  Laterality: N/A;   UMBILICAL HERNIA REPAIR N/A 07/13/2023   Procedure: HERNIA REPAIR UMBILICAL ADULT, open;  Ofilia Benton, Serita Danes, MD) [facility]S   Family History  Problem Relation Age of Onset   Hyperlipidemia Mother    Cancer Mother        peritoneal cancer   Hemochromatosis Father        s/p liver transplant   Prostate cancer Maternal Uncle    Prostate cancer Maternal Uncle    Stroke Neg Hx    Diabetes Neg Hx    CAD Neg Hx    Social History   Socioeconomic History   Marital status: Married    Spouse name: Not on file   Number of children: Not on file   Years of education: Not on file   Highest education level: Bachelor's degree (  e.g., BA, AB, BS)  Occupational History   Not on file  Tobacco Use   Smoking status: Never    Passive exposure: Never   Smokeless tobacco: Never  Vaping Use   Vaping status: Never Used  Substance and Sexual Activity   Alcohol use: Yes    Alcohol/week: 8.0 standard drinks of alcohol    Types: 7 Cans of beer, 1 Shots of liquor per week    Comment: 11-11-2022  per pt one beer daily and one drink on weekend   Drug use: Never   Sexual activity: Not on file  Other Topics Concern   Not on file  Social History Narrative   Lives with wife   Grown children   Occ: retired Engineer, civil (consulting), Quarry manager   Edu: Bachelor of Art   Activity: stays active outdoors working   Diet: some water, fruits/vegetables daily   Social Drivers of Corporate investment banker Strain: Low Risk  (04/11/2024)   Overall Financial Resource Strain (CARDIA)    Difficulty of Paying Living Expenses: Not hard at all  Food Insecurity: No Food Insecurity (04/11/2024)   Hunger Vital Sign    Worried About  Running Out of Food in the Last Year: Never true    Ran Out of Food in the Last Year: Never true  Transportation Needs: No Transportation Needs (04/11/2024)   PRAPARE - Administrator, Civil Service (Medical): No    Lack of Transportation (Non-Medical): No  Physical Activity: Sufficiently Active (04/11/2024)   Exercise Vital Sign    Days of Exercise per Week: 7 days    Minutes of Exercise per Session: 60 min  Stress: Patient Declined (04/11/2024)   [employer] of Occupational Health - Occupational Stress Questionnaire    Feeling of Stress : Patient declined  Social Connections: Moderately Integrated (04/11/2024)   Social Connection and Isolation Panel [NHANES]    Frequency of Communication with Friends and Family: More than three times a week    Frequency of Social Gatherings with Friends and Family: Twice a week    Attends Religious Services: 1 to 4 times per year    Active Member of [employer] or Organizations: No    Attends Engineer, structural: Not on file    Marital Status: Married    Tobacco Counseling Counseling given: Not Answered  Clinical Intake:  Pre-visit preparation completed: Yes  Pain : No/denies pain    BMI - recorded: 27.73 Nutritional Status: BMI 25 -29 Overweight Nutritional Risks: None Diabetes: No  No results found for: "HGBA1C"   How often do you need to have someone help you when you read instructions, pamphlets, or other written materials from your doctor or pharmacy?: 1 - Never  Interpreter Needed?: No  Comments: lives with wife Information entered by :: B.Tyson Masin,LPN   Activities of Daily Living     04/11/2024    1:07 PM 07/13/2023    1:41 PM  In your present state of health, do you have any difficulty performing the following activities:  Hearing? 0 0  Vision? 0 0  Difficulty concentrating or making decisions? 0 0  Comment  wears glasses  Walking or climbing stairs? 0 0  Dressing or bathing? 0   Doing errands, shopping?  0   Preparing Food and eating ? N   Using the Toilet? N   In the past six months, have you accidently leaked urine? N   Do you have problems with loss of bowel control? N  Managing your Medications? N   Managing your Finances? N   Housekeeping or managing your Housekeeping? N     Patient Care Team: Claire Crick, MD as PCP - General (Family Medicine) Jonathan Neighbor, [facility] (Inactive) as Pharmacist (Pharmacist) Sonja Garnavillo, MD as Consulting Physician (Hematology and Oncology) Kenith Payer, MD as Consulting Physician (Radiation Oncology) Homero Luster, MD as Attending Physician (Urology) Debbie Fails Laura Polio, NP as Nurse Practitioner (Hematology and Oncology) Dove, Natro D, RN as Registered Nurse  Indicate any recent Medical Services you may have received from other than [facility] providers in the past year (date may be approximate).     Assessment:    This is a routine wellness examination for Ibrohim.  Hearing/Vision screen Hearing Screening - Comments:: Pt says his hearing is good Vision Screening - Comments:: Pt says vision is good  Dr Parke Boll   Goals Addressed             This Visit's Progress    Patient Stated   On track    04/12/24- I will maintain and continue medications as prescribed.      Patient Stated       04/12/24-No new goals       Depression Screen     04/12/2024   10:16 AM 02/15/2024    8:49 AM 05/20/2023   10:15 AM 04/12/2023    1:00 PM 05/07/2022    1:14 PM 05/07/2022    1:13 PM 05/06/2021    9:56 AM  PHQ 2/9 Scores  PHQ - 2 Score 0 1 1 0 0 0 0  PHQ- 9 Score  5 5        Fall Risk     04/11/2024    1:07 PM 02/15/2024    8:49 AM 05/26/2023    9:12 AM 05/20/2023   10:15 AM 04/12/2023   12:56 PM  Fall Risk   Falls in the past year? 0 0 0 0 0  Number falls in past yr: 0  0  0  Injury with Fall? 0  0  0  Risk for fall due to : No Fall Risks    No Fall Risks  Follow up Education provided;Falls prevention discussed    Falls prevention discussed;Falls  evaluation completed    MEDICARE RISK AT HOME:  Medicare Risk at Home Any stairs in or around the home?: (Patient-Rptd) Yes If so, are there any without handrails?: (Patient-Rptd) No Home free of loose throw rugs in walkways, pet beds, electrical cords, etc?: (Patient-Rptd) No Adequate lighting in your home to reduce risk of falls?: (Patient-Rptd) Yes Life alert?: (Patient-Rptd) No Use of a cane, walker or w/c?: (Patient-Rptd) No Grab bars in the bathroom?: (Patient-Rptd) No Shower chair or bench in shower?: (Patient-Rptd) No Elevated toilet seat or a handicapped toilet?: (Patient-Rptd) No  TIMED UP AND GO:  Was the test performed?  No  Cognitive Function: 6CIT completed    04/24/2020   10:31 AM  MMSE - Mini Mental State Exam  Orientation to time 5  Orientation to Place 5  Registration 3  Attention/ Calculation 5  Recall 3  Language- repeat 1        04/12/2024   10:18 AM 04/12/2023    1:02 PM  6CIT Screen  What Year? 0 points 0 points  What month? 0 points 0 points  What time? 0 points 0 points  Count back from 20 0 points 0 points  Months in reverse 0 points 0 points  Repeat phrase 0 points  2 points  Total Score 0 points 2 points    Immunizations Immunization History  Administered Date(s) Administered   Influenza, High Dose Seasonal PF 10/02/2021, 10/05/2023   Influenza-Unspecified 10/09/2019   PFIZER(Purple Top)SARS-COV-2 Vaccination 12/28/2019, 01/17/2020, 09/25/2020, 04/20/2021   Pfizer Covid-19 Vaccine Bivalent Booster 77yrs & up 10/02/2021   Pfizer(Comirnaty)Fall Seasonal Vaccine 12 years and older 10/05/2023   Pneumococcal Conjugate-13 08/03/2016   Pneumococcal Polysaccharide-23 08/05/2017   Tdap 02/02/2017   Zoster Recombinant(Shingrix) 10/09/2019, 03/25/2020    Screening Tests Health Maintenance  Topic Date Due   COLON CANCER SCREENING ANNUAL FOBT  05/14/2023   COVID-19 Vaccine (7 - Pfizer risk 2024-25 season) 04/04/2024   INFLUENZA VACCINE   07/06/2024   Medicare Annual Wellness (AWV)  04/12/2025   Fecal DNA (Cologuard)  12/26/2026   DTaP/Tdap/Td (2 - Td or Tdap) 02/02/2027   Pneumonia Vaccine 58+ Years old  Completed   Hepatitis C Screening  Completed   Zoster Vaccines- Shingrix  Completed   HPV VACCINES  Aged Out   Meningococcal B Vaccine  Aged Out    Health Maintenance  Health Maintenance Due  Topic Date Due   COLON CANCER SCREENING ANNUAL FOBT  05/14/2023   COVID-19 Vaccine (7 - Pfizer risk 2024-25 season) 04/04/2024   Health Maintenance Items Addressed: None needed  Additional Screening:  Vision Screening: Recommended annual ophthalmology exams for early detection of glaucoma and other disorders of the eye.  Dental Screening: Recommended annual dental exams for proper oral hygiene  Community Resource Referral / Chronic Care Management: CRR required this visit?  No   CCM required this visit?  No     Plan:     I have personally reviewed and noted the following in the patient's chart:   Medical and social history Use of alcohol, tobacco or illicit drugs  Current medications and supplements including opioid prescriptions. Patient is not currently taking opioid prescriptions. Functional ability and status Nutritional status Physical activity Advanced directives List of other physicians Hospitalizations, surgeries, and ER visits in previous 12 months Vitals Screenings to include cognitive, depression, and falls Referrals and appointments  In addition, I have reviewed and discussed with patient certain preventive protocols, quality metrics, and best practice recommendations. A written personalized care plan for preventive services as well as general preventive health recommendations were provided to patient.    Nerissa Bannister, LPN   08/12/2951   After Visit Summary: (MyChart) Due to this being a telephonic visit, the after visit summary with patients personalized plan was offered to patient via  MyChart   Notes: Nothing significant to report at this time.

## 2024-04-12 NOTE — Patient Instructions (Signed)
 Shane [name] , Thank you for taking time to come for your Medicare Wellness Visit. I appreciate your ongoing commitment to your health goals. Please review the following plan we discussed and let me know if I can assist you in the future.   Referrals/Orders/Follow-Ups/Clinician Recommendations: none  This is a list of the screening recommended for you and due dates:  Health Maintenance  Topic Date Due   Stool Blood Test  05/14/2023   COVID-19 Vaccine (7 - Pfizer risk 2024-25 season) 04/04/2024   Flu Shot  07/06/2024   Medicare Annual Wellness Visit  04/12/2025   Cologuard (Stool DNA test)  12/26/2026   DTaP/Tdap/Td vaccine (2 - Td or Tdap) 02/02/2027   Pneumonia Vaccine  Completed   Hepatitis C Screening  Completed   Zoster (Shingles) Vaccine  Completed   HPV Vaccine  Aged Out   Meningitis B Vaccine  Aged Out    Advanced directives: (Copy Requested) Please bring a copy of your health care power of attorney and living will to the office to be added to your chart at your convenience. You can mail to [facility] [address] [place], [place] [address] or email to [email or web address]  Next Medicare Annual Wellness Visit scheduled for next year: Yes 04/16/25 @ 10:10am televisit

## 2024-06-14 ENCOUNTER — Encounter: Payer: Self-pay | Admitting: Family Medicine

## 2024-06-21 DIAGNOSIS — H25813 Combined forms of age-related cataract, bilateral: Secondary | ICD-10-CM | POA: Diagnosis not present

## 2024-07-10 DIAGNOSIS — C61 Malignant neoplasm of prostate: Secondary | ICD-10-CM | POA: Diagnosis not present

## 2024-07-17 DIAGNOSIS — C778 Secondary and unspecified malignant neoplasm of lymph nodes of multiple regions: Secondary | ICD-10-CM | POA: Diagnosis not present

## 2024-07-17 DIAGNOSIS — C61 Malignant neoplasm of prostate: Secondary | ICD-10-CM | POA: Diagnosis not present

## 2024-07-17 DIAGNOSIS — C7951 Secondary malignant neoplasm of bone: Secondary | ICD-10-CM | POA: Diagnosis not present

## 2024-08-25 ENCOUNTER — Other Ambulatory Visit: Payer: Self-pay | Admitting: Family Medicine

## 2024-08-25 DIAGNOSIS — Z86711 Personal history of pulmonary embolism: Secondary | ICD-10-CM

## 2024-08-27 NOTE — Telephone Encounter (Signed)
 Eliquis  Last filled:  07/30/24, #60 Last OV:  02/15/24, 3 mo f/u Next OV:  12/21/24, annual exam

## 2024-08-30 NOTE — Telephone Encounter (Signed)
 ERx

## 2024-10-02 ENCOUNTER — Other Ambulatory Visit: Payer: Self-pay | Admitting: Family Medicine

## 2024-10-02 NOTE — Telephone Encounter (Signed)
 Copied from CRM [id number]. Topic: Clinical - Medication Refill >> Oct 02, 2024  1:35 PM Taleah C wrote: Medication: flomax  Wants 90 day supply.   Has the patient contacted their pharmacy? Yes   This is the patient's preferred pharmacy:  [facility] [id number] [place], [place] - [address] [place] [address] Phone: [phone] Fax: [phone] Is this the correct pharmacy for this prescription? Yes If no, delete pharmacy and type the correct one.   Has the prescription been filled recently? No  Is the patient out of the medication? Yes  Has the patient been seen for an appointment in the last year OR does the patient have an upcoming appointment? Yes  Can we respond through MyChart? Yes  Agent: Please be advised that Rx refills may take up to 3 business days. We ask that you follow-up with your pharmacy.

## 2024-10-05 MED ORDER — TAMSULOSIN HCL 0.4 MG PO CAPS: 0.4000 mg | ORAL_CAPSULE | Freq: Two times a day (BID) | ORAL | 2 refills | Status: DC

## 2024-10-16 DIAGNOSIS — C61 Malignant neoplasm of prostate: Secondary | ICD-10-CM | POA: Diagnosis not present

## 2024-10-22 ENCOUNTER — Encounter: Payer: Self-pay | Admitting: Pharmacist

## 2024-10-22 NOTE — Progress Notes (Signed)
 Pharmacy Quality Measure Review  This patient is appearing on a report for being at risk of failing the Controlling Blood Pressure measure this calendar year.   Last documented BP  BP Readings from Last 1 Encounters:  02/15/24 (!) 148/76   does not meet criteria for measure closure (BP <140/90).   This patient is appearing on the insurance-providing list for being at risk of failing the adherence measure for Statin Therapy for Patients with Cardiovascular Disease (SPC) medications this calendar year.   No prior statin intolerance. Patient preference to not take medication. Patietn will not be excluded from measure.   2025 f/u scheduled: No   Lab Results  Component Value Date   LDLCALC 123 (H) 07/06/2023     Future Appointments  Date Time Provider Department Center  12/14/2024  8:00 AM [facility] LAB [facility] [address]  12/21/2024  8:00 AM Rilla Baller, MD [facility] [address]  04/16/2025 10:10 AM [facility] ANNUAL WELLNESS VISIT 1 [facility] [address]

## 2024-12-08 ENCOUNTER — Other Ambulatory Visit: Payer: Self-pay | Admitting: Family Medicine

## 2024-12-08 DIAGNOSIS — C61 Malignant neoplasm of prostate: Secondary | ICD-10-CM

## 2024-12-08 DIAGNOSIS — E559 Vitamin D deficiency, unspecified: Secondary | ICD-10-CM

## 2024-12-08 DIAGNOSIS — E538 Deficiency of other specified B group vitamins: Secondary | ICD-10-CM

## 2024-12-08 DIAGNOSIS — N289 Disorder of kidney and ureter, unspecified: Secondary | ICD-10-CM

## 2024-12-08 DIAGNOSIS — E7800 Pure hypercholesterolemia, unspecified: Secondary | ICD-10-CM

## 2024-12-08 NOTE — Progress Notes (Signed)
 CPE labs Abiraterone  stopped 12/2022 ADT stopped 07/2024

## 2024-12-14 ENCOUNTER — Ambulatory Visit: Payer: Self-pay | Admitting: Family Medicine

## 2024-12-14 ENCOUNTER — Other Ambulatory Visit (INDEPENDENT_AMBULATORY_CARE_PROVIDER_SITE_OTHER): Payer: PPO

## 2024-12-14 DIAGNOSIS — N289 Disorder of kidney and ureter, unspecified: Secondary | ICD-10-CM | POA: Diagnosis not present

## 2024-12-14 DIAGNOSIS — E7800 Pure hypercholesterolemia, unspecified: Secondary | ICD-10-CM | POA: Diagnosis not present

## 2024-12-14 DIAGNOSIS — E559 Vitamin D deficiency, unspecified: Secondary | ICD-10-CM | POA: Diagnosis not present

## 2024-12-14 DIAGNOSIS — E538 Deficiency of other specified B group vitamins: Secondary | ICD-10-CM

## 2024-12-14 LAB — LIPID PANEL
Cholesterol: 196 mg/dL (ref 28–200)
HDL: 50.3 mg/dL
LDL Cholesterol: 125 mg/dL — ABNORMAL HIGH (ref 10–99)
NonHDL: 145.57
Total CHOL/HDL Ratio: 4
Triglycerides: 101 mg/dL (ref 10.0–149.0)
VLDL: 20.2 mg/dL (ref 0.0–40.0)

## 2024-12-14 LAB — CBC WITH DIFFERENTIAL/PLATELET
Basophils Absolute: 0 K/uL (ref 0.0–0.1)
Basophils Relative: 0.7 % (ref 0.0–3.0)
Eosinophils Absolute: 0.3 K/uL (ref 0.0–0.7)
Eosinophils Relative: 9.2 % — ABNORMAL HIGH (ref 0.0–5.0)
HCT: 38.3 % — ABNORMAL LOW (ref 39.0–52.0)
Hemoglobin: 13.1 g/dL (ref 13.0–17.0)
Lymphocytes Relative: 22.6 % (ref 12.0–46.0)
Lymphs Abs: 0.8 K/uL (ref 0.7–4.0)
MCHC: 34.1 g/dL (ref 30.0–36.0)
MCV: 97.4 fl (ref 78.0–100.0)
Monocytes Absolute: 0.4 K/uL (ref 0.1–1.0)
Monocytes Relative: 10.8 % (ref 3.0–12.0)
Neutro Abs: 2 K/uL (ref 1.4–7.7)
Neutrophils Relative %: 56.7 % (ref 43.0–77.0)
Platelets: 180 K/uL (ref 150.0–400.0)
RBC: 3.94 Mil/uL — ABNORMAL LOW (ref 4.22–5.81)
RDW: 12.8 % (ref 11.5–15.5)
WBC: 3.6 K/uL — ABNORMAL LOW (ref 4.0–10.5)

## 2024-12-14 LAB — COMPREHENSIVE METABOLIC PANEL WITH GFR
ALT: 40 U/L (ref 3–53)
AST: 41 U/L — ABNORMAL HIGH (ref 5–37)
Albumin: 4.3 g/dL (ref 3.5–5.2)
Alkaline Phosphatase: 43 U/L (ref 39–117)
BUN: 23 mg/dL (ref 6–23)
CO2: 26 meq/L (ref 19–32)
Calcium: 9.3 mg/dL (ref 8.4–10.5)
Chloride: 106 meq/L (ref 96–112)
Creatinine, Ser: 1.18 mg/dL (ref 0.40–1.50)
GFR: 60.85 mL/min
Glucose, Bld: 100 mg/dL — ABNORMAL HIGH (ref 70–99)
Potassium: 4.3 meq/L (ref 3.5–5.1)
Sodium: 139 meq/L (ref 135–145)
Total Bilirubin: 0.6 mg/dL (ref 0.2–1.2)
Total Protein: 6.6 g/dL (ref 6.0–8.3)

## 2024-12-14 LAB — VITAMIN D 25 HYDROXY (VIT D DEFICIENCY, FRACTURES): VITD: 31.38 ng/mL (ref 30.00–100.00)

## 2024-12-14 LAB — VITAMIN B12: Vitamin B-12: 562 pg/mL (ref 211–911)

## 2024-12-21 ENCOUNTER — Ambulatory Visit (INDEPENDENT_AMBULATORY_CARE_PROVIDER_SITE_OTHER): Payer: PPO | Admitting: Family Medicine

## 2024-12-21 ENCOUNTER — Encounter: Payer: Self-pay | Admitting: Family Medicine

## 2024-12-21 VITALS — BP 128/74 | HR 63 | Temp 98.0°F | Ht 69.88 in | Wt 200.0 lb

## 2024-12-21 DIAGNOSIS — M2550 Pain in unspecified joint: Secondary | ICD-10-CM | POA: Diagnosis not present

## 2024-12-21 DIAGNOSIS — C61 Malignant neoplasm of prostate: Secondary | ICD-10-CM

## 2024-12-21 DIAGNOSIS — E7800 Pure hypercholesterolemia, unspecified: Secondary | ICD-10-CM

## 2024-12-21 DIAGNOSIS — I10 Essential (primary) hypertension: Secondary | ICD-10-CM

## 2024-12-21 DIAGNOSIS — Z7189 Other specified counseling: Secondary | ICD-10-CM | POA: Diagnosis not present

## 2024-12-21 DIAGNOSIS — E559 Vitamin D deficiency, unspecified: Secondary | ICD-10-CM

## 2024-12-21 DIAGNOSIS — E538 Deficiency of other specified B group vitamins: Secondary | ICD-10-CM

## 2024-12-21 DIAGNOSIS — Z86711 Personal history of pulmonary embolism: Secondary | ICD-10-CM

## 2024-12-21 DIAGNOSIS — Z7901 Long term (current) use of anticoagulants: Secondary | ICD-10-CM | POA: Diagnosis not present

## 2024-12-21 DIAGNOSIS — Z0000 Encounter for general adult medical examination without abnormal findings: Secondary | ICD-10-CM | POA: Diagnosis not present

## 2024-12-21 LAB — C-REACTIVE PROTEIN: CRP: <0.5 mg/dL — ABNORMAL LOW (ref 1.0–20.0)

## 2024-12-21 LAB — SEDIMENTATION RATE: Sed Rate: 14 mm/h (ref 0–20)

## 2024-12-21 MED ORDER — APIXABAN 5 MG PO TABS: 5.0000 mg | ORAL_TABLET | Freq: Two times a day (BID) | ORAL | 3 refills | Status: AC

## 2024-12-21 NOTE — Addendum Note (Signed)
 Addended by: HOPE VEVA PARAS on: 12/21/2024 08:40 AM   Modules accepted: Orders

## 2024-12-21 NOTE — Assessment & Plan Note (Signed)
 Chronic, levels stable on daily oral replacement - continue.

## 2024-12-21 NOTE — Assessment & Plan Note (Signed)
 Preventative protocols reviewed and updated unless pt declined. Discussed healthy diet and lifestyle.

## 2024-12-21 NOTE — Assessment & Plan Note (Signed)
 Advanced planning - has this set up. Wife would be HCPOA. Asked to bring [place] copy. Full code, but wouldn't want prolonged life support if terminal condition. Thinks would be ok with feeding tube.

## 2024-12-21 NOTE — Progress Notes (Addendum)
 " Ph: [phone] Fax: [phone]   Patient ID: Shane [name], male    DOB: 10/05/1951, 74 y.o.   MRN: [id number]  This visit was conducted in person.  BP 128/74 (BP Location: Left Arm, Patient Position: Sitting, Cuff Size: Normal)   Pulse 63   Temp 98 F (36.7 C) (Oral)   Ht 5' 9.88 (1.775 m)   Wt 200 lb (90.7 kg)   SpO2 98%   BMI 28.79 kg/m   BP Readings from Last 3 Encounters:  12/21/24 128/74  02/15/24 (!) 148/76  11/15/23 124/76   CC: CPE Subjective:   HPI: Shane [name] is a 74 y.o. male presenting on 12/21/2024 for Annual Exam (Pt not sure if he needs to do cologuard again yet/No acute concerns)   Saw health advisor 04/2024 for medicare wellness visit. Note reviewed.   No results found.  Flowsheet Row Clinical Support from 04/12/2024 in [facility]  PHQ-2 Total Score 0       12/21/2024    8:06 AM 04/11/2024    1:07 PM 02/15/2024    8:49 AM 05/26/2023    9:12 AM 05/20/2023   10:15 AM  Fall Risk   Falls in the past year? 0 0  0 0 0  Number falls in past yr: 0 0   0   Injury with Fall? 0 0    0    Risk for fall due to :  No Fall Risks     Follow up Falls evaluation completed Education provided;Falls prevention discussed        Manually entered by patient   Data saved with a previous flowsheet row definition   S/p umbilical hernia repair 07/2023 (Dr Lane).    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   Metastatic prostate cancer to bladder, LN, and bone dx (06/2019) complicated by PE s/p xarelto and pseudomonas UTI resistant to treatment s/p initial stents then bilateral nephrostomy tubes (removed 2021) s/p IV cefepime  course via PICC line. Saw urology Jacqulyne) onc Merlyn) and ID [place]).  Treated with zytiga  and lupron  injection Q6 mo, transitioned to Orgovyx which he completed in 06/2024. He is on flomax  BID.    [facility] HH (father, paternal uncle, son) - personal iron levels have been normal. Declined genetic testing.   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   Notes ongoing joint pains - back, knees, hips. Achey shoulders. Notes significant fatigue. No headaches, vision changes. Notes R index finger frequently locks up causing entire forearm to cramp. Back pain when physically active.   Calcium /mg/vit D supplement caused joint pains so he stopped this.   Preventative: Colonoscopy remotely normal. Cologuard normal 2017, positive 05/2020 (while actively inflamed hemorrhoids), subsequent iFOB returned negative 05/2020. Cologuard negative 12/2023.  Prostate cancer - see above.  Lung cancer screening - not eligible Flu shot - yearly COVID vaccine Pfizer 12/2019, 01/2020, booster 09/2020, 04/2021, bivalent 09/2021, 09/2023  Prevnar-13 2017, pneumovax 07/2017, discussed prevnar-20 Tdap 01/2017  Shingrix - 10/2019, 03/2020  Advanced planning - has this set up. Wife would be HCPOA. Asked to bring us  copy. Full code, but wouldn't want prolonged life support if terminal condition. Thinks would be ok with feeding tube.  Seat  belt use discussed  Sunscreen use discussed, no changing moles on skin.  Sleep - averaging 8 hours/night  Not a smoker  Alcohol - 1 beer or bourbon/night  Dentist yearly  Eye exam yearly  Bowel - no constipation, takes metamucil Bladder - nocturia x1-3, no incontinence   Lives with wife Grown children Occ: retired engineer, civil (consulting), quarry manager Edu: [employer] Activity: goes to gym 3x/wk  Diet: some water, fruits/vegetables daily      Relevant past medical, surgical, family and social history reviewed and updated as indicated. Interim medical  history since our last visit reviewed. Allergies and medications reviewed and updated. Outpatient Medications Prior to Visit  Medication Sig Dispense Refill   Cholecalciferol (VITAMIN D3) 25 MCG (1000 UT) CAPS Take 1-2 capsules (1,000-2,000 Units total) by mouth daily. 1 during spring/summer, 2 during fall/winter     cyanocobalamin  (VITAMIN B12) 1000 MCG tablet Take 1 tablet (1,000 mcg total) by mouth daily.     Probiotic Product (PROBIOTIC PO) Take by mouth daily.     Calcium  Polycarbophil (FIBER-CAPS PO) Take by mouth daily.     ELIQUIS  5 MG TABS tablet TAKE 1 TABLET BY MOUTH 2 TIMES A DAY 60 tablet 6   relugolix (ORGOVYX) 120 MG tablet Take 1 tablet (120 mg total) by mouth daily. For prostate cancer     tamsulosin  (FLOMAX ) 0.4 MG CAPS capsule Take 1 capsule (0.4 mg total) by mouth 2 (two) times daily. 90 capsule 2   tamsulosin  (FLOMAX ) 0.4 MG CAPS capsule Take 1 capsule (0.4 mg total) by mouth daily after supper.     tamsulosin  (FLOMAX ) 0.4 MG CAPS capsule Take 1 capsule (0.4 mg total) by mouth 2 (two) times daily.     No facility-administered medications prior to visit.     Per HPI unless specifically indicated in ROS section below Review of Systems  Constitutional:  Negative for activity change, appetite change, chills, fatigue, fever and unexpected weight change.  HENT:  Negative for hearing loss.   Eyes:  Negative for visual disturbance.  Respiratory:  Negative for cough, chest tightness, shortness of breath and wheezing.   Cardiovascular:  Negative for chest pain, palpitations and leg swelling.  Gastrointestinal:  Negative for abdominal distention, abdominal pain, blood in stool, constipation, diarrhea, nausea and vomiting.  Genitourinary:  Negative for difficulty urinating and hematuria.  Musculoskeletal:  Positive for arthralgias and back pain. Negative for joint swelling, myalgias and neck pain.       Predominantly to knees  Skin:  Negative for rash.  Neurological:  Positive  for dizziness (with ladders). Negative for seizures, syncope and headaches.  Hematological:  Negative for adenopathy. Bruises/bleeds easily.  Psychiatric/Behavioral:  Negative for dysphoric mood. The patient is not nervous/anxious.     Objective:  BP 128/74 (BP Location: Left Arm, Patient Position: Sitting, Cuff Size: Normal)   Pulse 63   Temp 98 F (36.7 C) (Oral)   Ht 5' 9.88 (1.775 m)   Wt 200 lb (90.7 kg)   SpO2 98%   BMI 28.79 kg/m   Wt Readings from Last 3 Encounters:  12/21/24 200 lb (90.7 kg)  04/12/24 196 lb (88.9 kg)  02/15/24 205 lb (93 kg)      Physical Exam Vitals and nursing note reviewed.  Constitutional:      General: He is not in acute distress.    Appearance: Normal appearance. He is well-developed. He is not ill-appearing.  HENT:     Head: Normocephalic and atraumatic.  Right Ear: Hearing, tympanic membrane, ear canal and external ear normal.     Left Ear: Hearing, tympanic membrane, ear canal and external ear normal.     Mouth/Throat:     Mouth: Mucous membranes are moist.     Pharynx: Oropharynx is clear. No oropharyngeal exudate or posterior oropharyngeal erythema.  Eyes:     General: No scleral icterus.    Extraocular Movements: Extraocular movements intact.     Conjunctiva/sclera: Conjunctivae normal.     Pupils: Pupils are equal, round, and reactive to light.  Neck:     Thyroid : No thyroid  mass or thyromegaly.     Vascular: No carotid bruit.  Cardiovascular:     Rate and Rhythm: Normal rate and regular rhythm.     Pulses: Normal pulses.          Radial pulses are 2+ on the right side and 2+ on the left side.     Heart sounds: Normal heart sounds. No murmur heard. Pulmonary:     Effort: Pulmonary effort is normal. No respiratory distress.     Breath sounds: Normal breath sounds. No wheezing, rhonchi or rales.  Abdominal:     General: Bowel sounds are normal. There is no distension.     Palpations: Abdomen is soft. There is no mass.      Tenderness: There is no abdominal tenderness. There is no guarding or rebound.     Hernia: No hernia is present.  Musculoskeletal:        General: Normal range of motion.     Cervical back: Normal range of motion and neck supple.     Right lower leg: No edema.     Left lower leg: No edema.  Lymphadenopathy:     Cervical: No cervical adenopathy.  Skin:    General: Skin is warm and dry.     Findings: No rash.  Neurological:     General: No focal deficit present.     Mental Status: He is alert and oriented to person, place, and time.  Psychiatric:        Mood and Affect: Mood normal.        Behavior: Behavior normal.        Thought Content: Thought content normal.        Judgment: Judgment normal.       Results for orders placed or performed in visit on 12/14/24  CBC with Differential/Platelet   Collection Time: 12/14/24  8:00 AM  Result Value Ref Range   WBC 3.6 (L) 4.0 - 10.5 K/uL   RBC 3.94 (L) 4.22 - 5.81 Mil/uL   Hemoglobin 13.1 13.0 - 17.0 g/dL   HCT 61.6 (L) 60.9 - 47.9 %   MCV 97.4 78.0 - 100.0 fl   MCHC 34.1 30.0 - 36.0 g/dL   RDW 87.1 88.4 - 84.4 %   Platelets 180.0 150.0 - 400.0 K/uL   Neutrophils Relative % 56.7 43.0 - 77.0 %   Lymphocytes Relative 22.6 12.0 - 46.0 %   Monocytes Relative 10.8 3.0 - 12.0 %   Eosinophils Relative 9.2 (H) 0.0 - 5.0 %   Basophils Relative 0.7 0.0 - 3.0 %   Neutro Abs 2.0 1.4 - 7.7 K/uL   Lymphs Abs 0.8 0.7 - 4.0 K/uL   Monocytes Absolute 0.4 0.1 - 1.0 K/uL   Eosinophils Absolute 0.3 0.0 - 0.7 K/uL   Basophils Absolute 0.0 0.0 - 0.1 K/uL  VITAMIN D  25 Hydroxy (Vit-D Deficiency, Fractures)   Collection Time: 12/14/24  8:00 AM  Result Value Ref Range   VITD 31.38 30.00 - 100.00 ng/mL  Vitamin B12   Collection Time: 12/14/24  8:00 AM  Result Value Ref Range   Vitamin B-12 562 211 - 911 pg/mL  Comprehensive metabolic panel with GFR   Collection Time: 12/14/24  8:00 AM  Result Value Ref Range   Sodium 139 135 - 145 mEq/L    Potassium 4.3 3.5 - 5.1 mEq/L   Chloride 106 96 - 112 mEq/L   CO2 26 19 - 32 mEq/L   Glucose, Bld 100 (H) 70 - 99 mg/dL   BUN 23 6 - 23 mg/dL   Creatinine, Ser 8.81 0.40 - 1.50 mg/dL   Total Bilirubin 0.6 0.2 - 1.2 mg/dL   Alkaline Phosphatase 43 39 - 117 U/L   AST 41 (H) 5 - 37 U/L   ALT 40 3 - 53 U/L   Total Protein 6.6 6.0 - 8.3 g/dL   Albumin 4.3 3.5 - 5.2 g/dL   GFR 39.14 >39.99 mL/min   Calcium  9.3 8.4 - 10.5 mg/dL  Lipid panel   Collection Time: 12/14/24  8:00 AM  Result Value Ref Range   Cholesterol 196 28 - 200 mg/dL   Triglycerides 898.9 89.9 - 149.0 mg/dL   HDL 49.69 >60.99 mg/dL   VLDL 79.7 0.0 - 59.9 mg/dL   LDL Cholesterol 874 (H) 10 - 99 mg/dL   Total CHOL/HDL Ratio 4    NonHDL 145.57     Assessment & Plan:   Problem List Items Addressed This Visit     Advanced care planning/counseling discussion (Chronic)   Advanced planning - has this set up. Wife would be HCPOA. Asked to bring us  copy. Full code, but wouldn't want prolonged life support if terminal condition. Thinks would be ok with feeding tube.       Health maintenance examination - Primary (Chronic)   Preventative protocols reviewed and updated unless pt declined. Discussed healthy diet and lifestyle.       HLD (hyperlipidemia)   Chronic, stable period off statin since 2023. Reviewed diet choices to control LDL  The 10-year ASCVD risk score (Arnett DK, et al., 2019) is: 23.1%   Values used to calculate the score:     Age: 30 years     Clinically relevant sex: Male     Is Non-Hispanic African American: No     Diabetic: No     Tobacco smoker: No     Systolic Blood Pressure: 128 mmHg     Is BP treated: No     HDL Cholesterol: 50.3 mg/dL     Total Cholesterol: 196 mg/dL       Relevant Medications   apixaban  (ELIQUIS ) 5 MG TABS tablet   Prostate cancer metastatic to multiple sites [facility])   Continues regular urology f/u. Completed Orgovyx 06/2024      History of pulmonary embolism   Continue  lifelong anticoagulation with eliquis  due to recurrent PE       Relevant Medications   apixaban  (ELIQUIS ) 5 MG TABS tablet   Hypertension   BP stable off medication - continue to monitor.       Relevant Medications   apixaban  (ELIQUIS ) 5 MG TABS tablet   Anticoagulant long-term use   Vitamin B12 deficiency   Chronic, levels stable on daily oral replacement - continue.       Vitamin D  deficiency   Chronic, stable period on vit D 1000 international units daily.  Polyarthralgia   Describes diffuse joint aches with shoulder stiffness past 90 degrees as well as longterm fatigue previously attributed to androgen deprivation therapy for prostate cancer (off this med since 06/2024). Trouble getting up from the floor on his own.  Will check ANA and inflammatory markers r/o PMR      Relevant Orders   C-reactive protein   ANA   Sedimentation rate     Meds ordered this encounter  Medications   apixaban  (ELIQUIS ) 5 MG TABS tablet    Sig: Take 1 tablet (5 mg total) by mouth 2 (two) times daily.    Dispense:  180 tablet    Refill:  3    Orders Placed This Encounter  Procedures   C-reactive protein   ANA   Sedimentation rate    Patient Instructions  Bring us  copy of your living will (medical advanced directives)  Check inflammatory markers today  Good to see you today  Return as needed or in 1 year for next physical/wellness visit   Follow up plan: Return in about 1 year (around 12/21/2025) for annual exam, prior fasting for blood work, medicare wellness visit.  Anton Blas, MD   "

## 2024-12-21 NOTE — Assessment & Plan Note (Addendum)
 BP stable off medication - continue to monitor.

## 2024-12-21 NOTE — Assessment & Plan Note (Signed)
 Describes diffuse joint aches with shoulder stiffness past 90 degrees as well as longterm fatigue previously attributed to androgen deprivation therapy for prostate cancer (off this med since 06/2024). Trouble getting up from the floor on his own.  Will check ANA and inflammatory markers r/o PMR

## 2024-12-21 NOTE — Assessment & Plan Note (Signed)
 Chronic, stable period on vit D 1000 international units daily.

## 2024-12-21 NOTE — Assessment & Plan Note (Signed)
 Continue lifelong anticoagulation with eliquis  due to recurrent PE

## 2024-12-21 NOTE — Assessment & Plan Note (Signed)
 Chronic, stable period off statin since 2023. Reviewed diet choices to control LDL  The 10-year ASCVD risk score (Arnett DK, et al., 2019) is: 23.1%   Values used to calculate the score:     Age: 74 years     Clinically relevant sex: Male     Is Non-Hispanic African American: No     Diabetic: No     Tobacco smoker: No     Systolic Blood Pressure: 128 mmHg     Is BP treated: No     HDL Cholesterol: 50.3 mg/dL     Total Cholesterol: 196 mg/dL

## 2024-12-21 NOTE — Assessment & Plan Note (Signed)
 Continues regular urology f/u. Completed Orgovyx 06/2024

## 2024-12-21 NOTE — Patient Instructions (Addendum)
 Bring us  copy of your living will (medical advanced directives)  Check inflammatory markers today  Good to see you today  Return as needed or in 1 year for next physical/wellness visit

## 2024-12-24 ENCOUNTER — Ambulatory Visit: Payer: Self-pay | Admitting: Family Medicine

## 2024-12-27 ENCOUNTER — Other Ambulatory Visit (HOSPITAL_COMMUNITY): Payer: Self-pay | Admitting: Urology

## 2024-12-27 DIAGNOSIS — C778 Secondary and unspecified malignant neoplasm of lymph nodes of multiple regions: Secondary | ICD-10-CM

## 2024-12-27 DIAGNOSIS — C61 Malignant neoplasm of prostate: Secondary | ICD-10-CM

## 2024-12-27 DIAGNOSIS — C7951 Secondary malignant neoplasm of bone: Secondary | ICD-10-CM

## 2024-12-27 LAB — ANTI-NUCLEAR AB-TITER (ANA TITER)
ANA TITER: 1:80 {titer} — ABNORMAL HIGH
ANA Titer 1: 1:40 {titer} — ABNORMAL HIGH

## 2024-12-27 LAB — ANA: Anti Nuclear Antibody (ANA): POSITIVE — AB

## 2025-01-01 ENCOUNTER — Ambulatory Visit
Admission: RE | Admit: 2025-01-01 | Discharge: 2025-01-01 | Disposition: A | Discharge status: Home / Self Care | Source: Ambulatory Visit | Attending: Urology | Admitting: Urology

## 2025-01-01 DIAGNOSIS — C7951 Secondary malignant neoplasm of bone: Secondary | ICD-10-CM | POA: Insufficient documentation

## 2025-01-01 DIAGNOSIS — C61 Malignant neoplasm of prostate: Secondary | ICD-10-CM | POA: Insufficient documentation

## 2025-01-01 DIAGNOSIS — C7952 Secondary malignant neoplasm of bone marrow: Secondary | ICD-10-CM | POA: Insufficient documentation

## 2025-01-01 DIAGNOSIS — C778 Secondary and unspecified malignant neoplasm of lymph nodes of multiple regions: Secondary | ICD-10-CM | POA: Insufficient documentation

## 2025-01-01 MED ORDER — FLOTUFOLASTAT F 18 GALLIUM 296-5846 MBQ/ML IV SOLN
8.0000 | Freq: Once | INTRAVENOUS | Status: AC
  Administered 2025-01-01: 8.69 via INTRAVENOUS
  Filled 2025-01-01: qty 8

## 2025-01-04 ENCOUNTER — Encounter (HOSPITAL_COMMUNITY)

## 2025-01-11 DIAGNOSIS — C778 Secondary and unspecified malignant neoplasm of lymph nodes of multiple regions: Secondary | ICD-10-CM | POA: Insufficient documentation

## 2025-01-11 DIAGNOSIS — C7951 Secondary malignant neoplasm of bone: Secondary | ICD-10-CM | POA: Insufficient documentation

## 2025-01-11 NOTE — Progress Notes (Incomplete)
 Histology and Location of Primary Cancer: Prostate  Sites of Visceral and Bony Metastatic Disease: posterior left iliac bone and T6 vertebral body  Location(s) of Symptomatic Metastases:   Past/Anticipated chemotherapy by medical oncology, if any:  Per Dr Windy note from 12/21/22: He has achieved complete response to androgen deprivation and Zytiga  with PSA that remains undetectable as well as uptake in the prostate gland only on a PSMA PET.  No evidence of metastatic disease.  Management options at this time were discussed and the role for systemic therapy as well as local therapy was reviewed.  Given his complete response, it is reasonable to de-escalate therapy from Zytiga  but I would like to continue androgen deprivation therapy if he is willing to.    After discussion today, I recommended discontinuation of Zytiga  and prednisone  and continuing androgen deprivation therapy.  He understands that this strategy is unproven and restarting Zytiga  or similar drug could be initiated in the future if his PSA continues to rise.   Androgen deprivation: Risks and benefits of doing Eligard  were discussed at this time.  Complications that include weight gain, hot flashes and sexual dysfunction were discussed.  After discussion today, I recommended continuing this treatment for the time being.  This can be revisited may be in the next year if he continues to have no evidence of residual disease.  My preference is to continue it indefinitely.   Pain on a scale of 0-10 is: {Number; 1-10  not applicable:20727}    If Spine Met(s), symptoms, if any, include: Bowel/Bladder retention or incontinence (please describe): *** Numbness or weakness in extremities (please describe): *** Current Decadron  regimen, if applicable: ***  Ambulatory status? Walker? Wheelchair?: {VQI Ambulatory Status:20974}  SAFETY ISSUES: Prior radiation? Yes, 12/07/22- 01/03/23 (prostate) Pacemaker/ICD? {:18581} Possible current  pregnancy? no Is the patient on methotrexate? no  Current Complaints / other details:  ***

## 2025-01-18 ENCOUNTER — Ambulatory Visit: Admitting: Radiation Oncology

## 2025-01-18 ENCOUNTER — Ambulatory Visit

## 2025-01-18 DIAGNOSIS — C7951 Secondary malignant neoplasm of bone: Secondary | ICD-10-CM

## 2025-01-18 DIAGNOSIS — C778 Secondary and unspecified malignant neoplasm of lymph nodes of multiple regions: Secondary | ICD-10-CM

## 2025-04-16 ENCOUNTER — Ambulatory Visit

## 2025-12-18 ENCOUNTER — Other Ambulatory Visit

## 2025-12-25 ENCOUNTER — Encounter: Admitting: Family Medicine
